# Patient Record
Sex: Female | Born: 1950 | Race: White | Hispanic: No | Marital: Married | Smoking: Never smoker
Health system: Southern US, Community
[De-identification: ages and names within clinical notes are randomized; demographics above are authoritative.]

## PROBLEM LIST (undated history)

## (undated) DIAGNOSIS — K589 Irritable bowel syndrome without diarrhea: Secondary | ICD-10-CM

## (undated) DIAGNOSIS — K219 Gastro-esophageal reflux disease without esophagitis: Secondary | ICD-10-CM

## (undated) DIAGNOSIS — I442 Atrioventricular block, complete: Secondary | ICD-10-CM

## (undated) DIAGNOSIS — M1990 Unspecified osteoarthritis, unspecified site: Secondary | ICD-10-CM

## (undated) DIAGNOSIS — G4730 Sleep apnea, unspecified: Secondary | ICD-10-CM

## (undated) DIAGNOSIS — K449 Diaphragmatic hernia without obstruction or gangrene: Secondary | ICD-10-CM

## (undated) DIAGNOSIS — I4892 Unspecified atrial flutter: Secondary | ICD-10-CM

## (undated) DIAGNOSIS — J309 Allergic rhinitis, unspecified: Secondary | ICD-10-CM

## (undated) DIAGNOSIS — D649 Anemia, unspecified: Secondary | ICD-10-CM

## (undated) DIAGNOSIS — K279 Peptic ulcer, site unspecified, unspecified as acute or chronic, without hemorrhage or perforation: Secondary | ICD-10-CM

## (undated) DIAGNOSIS — D689 Coagulation defect, unspecified: Secondary | ICD-10-CM

## (undated) DIAGNOSIS — M810 Age-related osteoporosis without current pathological fracture: Secondary | ICD-10-CM

## (undated) DIAGNOSIS — G43909 Migraine, unspecified, not intractable, without status migrainosus: Secondary | ICD-10-CM

## (undated) DIAGNOSIS — I4891 Unspecified atrial fibrillation: Secondary | ICD-10-CM

## (undated) DIAGNOSIS — E079 Disorder of thyroid, unspecified: Secondary | ICD-10-CM

## (undated) DIAGNOSIS — K2210 Ulcer of esophagus without bleeding: Secondary | ICD-10-CM

## (undated) DIAGNOSIS — Z950 Presence of cardiac pacemaker: Secondary | ICD-10-CM

## (undated) DIAGNOSIS — K5790 Diverticulosis of intestine, part unspecified, without perforation or abscess without bleeding: Secondary | ICD-10-CM

## (undated) DIAGNOSIS — I480 Paroxysmal atrial fibrillation: Secondary | ICD-10-CM

## (undated) DIAGNOSIS — E039 Hypothyroidism, unspecified: Secondary | ICD-10-CM

## (undated) DIAGNOSIS — E785 Hyperlipidemia, unspecified: Secondary | ICD-10-CM

## (undated) DIAGNOSIS — E041 Nontoxic single thyroid nodule: Secondary | ICD-10-CM

## (undated) HISTORY — DX: Unspecified osteoarthritis, unspecified site: M19.90

## (undated) HISTORY — DX: Ulcer of esophagus without bleeding: K22.10

## (undated) HISTORY — DX: Sleep apnea, unspecified: G47.30

## (undated) HISTORY — DX: Hypothyroidism, unspecified: E03.9

## (undated) HISTORY — DX: Unspecified atrial flutter: I48.92

## (undated) HISTORY — DX: Presence of cardiac pacemaker: Z95.0

## (undated) HISTORY — DX: Hyperlipidemia, unspecified: E78.5

## (undated) HISTORY — DX: Anemia, unspecified: D64.9

## (undated) HISTORY — DX: Age-related osteoporosis without current pathological fracture: M81.0

## (undated) HISTORY — DX: Nontoxic single thyroid nodule: E04.1

## (undated) HISTORY — DX: Migraine, unspecified, not intractable, without status migrainosus: G43.909

## (undated) HISTORY — DX: Gastro-esophageal reflux disease without esophagitis: K21.9

## (undated) HISTORY — DX: Diaphragmatic hernia without obstruction or gangrene: K44.9

## (undated) HISTORY — DX: Paroxysmal atrial fibrillation: I48.0

## (undated) HISTORY — DX: Unspecified atrial fibrillation: I48.91

## (undated) HISTORY — DX: Atrioventricular block, complete: I44.2

## (undated) HISTORY — DX: Irritable bowel syndrome, unspecified: K58.9

## (undated) HISTORY — DX: Disorder of thyroid, unspecified: E07.9

## (undated) HISTORY — DX: Peptic ulcer, site unspecified, unspecified as acute or chronic, without hemorrhage or perforation: K27.9

## (undated) HISTORY — DX: Diverticulosis of intestine, part unspecified, without perforation or abscess without bleeding: K57.90

## (undated) HISTORY — DX: Coagulation defect, unspecified: D68.9

## (undated) HISTORY — DX: Allergic rhinitis, unspecified: J30.9

---

## 1984-01-25 HISTORY — PX: THYROIDECTOMY, PARTIAL: SHX18

## 1985-01-24 HISTORY — PX: TUBAL LIGATION: SHX77

## 1994-01-24 DIAGNOSIS — I442 Atrioventricular block, complete: Secondary | ICD-10-CM

## 1994-01-24 HISTORY — DX: Atrioventricular block, complete: I44.2

## 1994-09-08 HISTORY — PX: PACEMAKER INSERTION: SHX728

## 1999-08-18 ENCOUNTER — Other Ambulatory Visit: Admission: RE | Admit: 1999-08-18 | Discharge: 1999-08-18 | Payer: Self-pay | Admitting: Obstetrics and Gynecology

## 2004-02-14 ENCOUNTER — Ambulatory Visit: Payer: Self-pay | Admitting: Internal Medicine

## 2004-04-09 ENCOUNTER — Ambulatory Visit: Payer: Self-pay | Admitting: Internal Medicine

## 2004-04-26 ENCOUNTER — Other Ambulatory Visit: Admission: RE | Admit: 2004-04-26 | Discharge: 2004-04-26 | Payer: Self-pay | Admitting: Obstetrics and Gynecology

## 2004-05-28 ENCOUNTER — Ambulatory Visit (HOSPITAL_COMMUNITY): Admission: RE | Admit: 2004-05-28 | Discharge: 2004-05-28 | Payer: Self-pay | Admitting: Obstetrics and Gynecology

## 2004-06-02 ENCOUNTER — Ambulatory Visit: Payer: Self-pay | Admitting: Cardiology

## 2004-07-14 ENCOUNTER — Ambulatory Visit: Payer: Self-pay | Admitting: Internal Medicine

## 2004-08-17 ENCOUNTER — Ambulatory Visit: Payer: Self-pay | Admitting: Internal Medicine

## 2004-09-14 ENCOUNTER — Ambulatory Visit: Payer: Self-pay | Admitting: Internal Medicine

## 2004-10-20 ENCOUNTER — Ambulatory Visit: Payer: Self-pay | Admitting: Internal Medicine

## 2004-11-05 ENCOUNTER — Ambulatory Visit: Payer: Self-pay | Admitting: Internal Medicine

## 2004-11-17 ENCOUNTER — Ambulatory Visit: Payer: Self-pay | Admitting: Internal Medicine

## 2004-12-22 ENCOUNTER — Ambulatory Visit: Payer: Self-pay | Admitting: Internal Medicine

## 2005-01-28 ENCOUNTER — Ambulatory Visit: Payer: Self-pay | Admitting: Internal Medicine

## 2005-03-04 ENCOUNTER — Ambulatory Visit: Payer: Self-pay | Admitting: Internal Medicine

## 2005-04-15 ENCOUNTER — Ambulatory Visit: Payer: Self-pay | Admitting: Internal Medicine

## 2005-06-22 ENCOUNTER — Ambulatory Visit: Payer: Self-pay | Admitting: Internal Medicine

## 2005-07-12 ENCOUNTER — Ambulatory Visit (HOSPITAL_COMMUNITY): Admission: RE | Admit: 2005-07-12 | Discharge: 2005-07-12 | Payer: Self-pay | Admitting: Obstetrics and Gynecology

## 2005-08-12 ENCOUNTER — Ambulatory Visit: Payer: Self-pay | Admitting: Internal Medicine

## 2005-10-14 ENCOUNTER — Ambulatory Visit: Payer: Self-pay | Admitting: Internal Medicine

## 2005-11-03 ENCOUNTER — Encounter: Admission: RE | Admit: 2005-11-03 | Discharge: 2005-11-03 | Payer: Self-pay | Admitting: Orthopedic Surgery

## 2005-12-02 ENCOUNTER — Encounter: Admission: RE | Admit: 2005-12-02 | Discharge: 2005-12-02 | Payer: Self-pay | Admitting: Gastroenterology

## 2006-01-24 HISTORY — PX: HIP ARTHROPLASTY: SHX981

## 2006-07-18 ENCOUNTER — Ambulatory Visit (HOSPITAL_COMMUNITY): Admission: RE | Admit: 2006-07-18 | Discharge: 2006-07-18 | Payer: Self-pay | Admitting: Family Medicine

## 2006-08-01 ENCOUNTER — Inpatient Hospital Stay (HOSPITAL_COMMUNITY): Admission: RE | Admit: 2006-08-01 | Discharge: 2006-08-04 | Payer: Self-pay | Admitting: Orthopedic Surgery

## 2007-03-01 HISTORY — PX: PACEMAKER GENERATOR CHANGE: SHX5998

## 2007-07-19 ENCOUNTER — Ambulatory Visit (HOSPITAL_COMMUNITY): Admission: RE | Admit: 2007-07-19 | Discharge: 2007-07-19 | Payer: Self-pay | Admitting: Obstetrics and Gynecology

## 2008-10-29 ENCOUNTER — Ambulatory Visit (HOSPITAL_COMMUNITY): Admission: RE | Admit: 2008-10-29 | Discharge: 2008-10-29 | Payer: Self-pay | Admitting: Obstetrics and Gynecology

## 2010-02-14 ENCOUNTER — Encounter: Payer: Self-pay | Admitting: Family Medicine

## 2010-05-31 ENCOUNTER — Other Ambulatory Visit (HOSPITAL_COMMUNITY): Payer: Self-pay | Admitting: Obstetrics and Gynecology

## 2010-05-31 DIAGNOSIS — Z1231 Encounter for screening mammogram for malignant neoplasm of breast: Secondary | ICD-10-CM

## 2010-06-08 NOTE — H&P (Signed)
Kristin [name], Kristin [name]                ACCOUNT NO.:  [id number]   MEDICAL RECORD NO.:  [id number]         PATIENT TYPE:  LINP   LOCATION:                               FACILITY:  [facility]   PHYSICIAN:  Madlyn Frankel. Charlann Boxer, M.D.  DATE OF BIRTH:  Dec 01, 1950   DATE OF ADMISSION:  08/01/2006  DATE OF DISCHARGE:                              HISTORY & PHYSICAL   PROCEDURE:  Right total hip arthroplasty.   CHIEF COMPLAINT:  Right hip groin pain.   HISTORY OF PRESENT ILLNESS:  This is a 60 year old female with a history  persistent progressive right hip groin pain.  It has been refractory to  all conservative treatments.  She has been presurgically assessed by Dr.  Asencion Partridge.  She also does have a history of pacemaker back in 1996  which has been really basically unused since then.  She is going to  follow up with Dr. Graciela Husbands, a local cardiologist, before surgery in the  event there is any complications during surgery that requires the use of  this pacemaker.   PAST MEDICAL HISTORY:  Significant for:  1. Osteoarthritis.  2. 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  3. Gastroesophageal reflux disease.  4. Subtotal thyroidectomy.   PAST SURGICAL HISTORY:  1. Subtotal thyroidectomy.  2. Tubal ligation.  3. Pacemaker implant.  4. Colonoscopy.   FAMILY HISTORY:  Cancer, arthritis.   SOCIAL HISTORY:  The patient is married.  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  Her husband is Georges Lynch, who is an MD in psychiatry.   DRUG ALLERGIES:  1. MORPHINE.  2. Allergic to ADHESIVES including ELECTRODES, which leave marks for      several weeks.  3. She also has a questionable LATEX allergy.   MEDICATIONS:  1. Levothroid 125 mcg p.o. daily.  2. Celebrex 200 mg p.o. daily.  3. Multivitamin.  4. Calcium plus vitamin D.   REVIEW OF SYSTEMS:  NEUROLOGIC:  Occasional headaches.  GASTROINTESTINAL:  Hiatal hernia.  GYNECOLOGIC:  Menopausal, some  minor  hot flashes.  Otherwise, see HPI.   PHYSICAL EXAMINATION:  VITAL SIGNS:  Pulse 72, respirations 18, blood  pressure 104/76.  GENERAL:  She is awake, alert and oriented, well-developed, well-  nourished, no acute distress.  NECK:  Supple.  No carotid bruits.  CHEST:  Lungs clear to auscultation bilaterally.  BREASTS:  Deferred.  HEART:  Regular rate and rhythm without gallops, clicks, rubs or  murmurs.  ABDOMEN:  Soft, nontender, nondistended.  Bowel sounds present.  GENITOURINARY:  Deferred.  EXTREMITIES:  Right hip has increased pain with range of motion.  Her  dorsalis pedis pulse is positive in her right lower extremity.  SKIN:  Intact.  No cellulitis.  NEUROLOGIC:  Intact distal sensibilities.   Labs, EKG, chest x-ray all pending presurgical clearance.   IMPRESSION:  1. Osteoarthritis.  2. Thyroidectomy.  3. Pacemaker.   PLAN OF ACTION:  Right total hip arthroplasty August 01, 2006, at [facility] by surgeon, Dr. Durene Romans.  Risks  and complications were  discussed.  Questions were encouraged, answered and reviewed.  Postoperative prescriptions were given at time of H&P.     ______________________________  Yetta Glassman. Loreta Ave, [place]      Madlyn Frankel. Charlann Boxer, M.D.  Electronically Signed    BLM/MEDQ  D:  07/14/2006  T:  07/14/2006  Job:  [id number]

## 2010-06-08 NOTE — Op Note (Signed)
NAMEJERZEY, KOMPERDA                ACCOUNT NO.:  [id number]   MEDICAL RECORD NO.:  [id number]          PATIENT TYPE:  INP   LOCATION:  0003                         FACILITY:  [facility]   PHYSICIAN:  Madlyn Frankel. Charlann Boxer, M.D.  DATE OF BIRTH:  [birth year]   DATE OF PROCEDURE:  08/01/2006  DATE OF DISCHARGE:                               OPERATIVE REPORT   PREOPERATIVE DIAGNOSIS:  Right hip osteoarthritis.   POSTOPERATIVE DIAGNOSIS:  Right hip osteoarthritis.   PROCEDURE:  Right total hip replacement.   COMPONENTS USED:  DePuy hip system with a size 50 ASR cup, a size 6 Tri-  Lock Gription cup high-offset with a 45 +2 femoral head plus adapter.   SURGEON:  Dr. Charlann Boxer   ASSISTANT:  Dwyane Luo, PA-C   ANESTHESIA:  General.   BLOOD LOSS:  400.   DRAINS:  None.   COMPLICATIONS:  None.   INDICATION FOR PROCEDURE:  Ms. Nikolic is a pleasant 60 year old female who  presented to the office in the fall 2007 with complaints of bilateral  hip pain, right greater than left.  She had had radiographic evidence of  bone-on-bone articulation with cystic changes.  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  We discussed and reviewed the risks and benefits of  infection, dislocation, DVT, component failure, need for revision  surgery.  We discussed the bearing surfaces in terms of her health, age,  and longevity.  Consent was obtained for a metal-on-metal hip  replacement.   PROCEDURE IN DETAIL:  The patient was brought to the operative theatre.  Once adequate anesthesia and preoperative antibiotics were administered,  the patient was positioned in the left lateral decubitus position with  the right side up.  The right lower extremity was prescrubbed then  prepped and draped in a standard fashion.  Lateral-based incision was  made for a posterior approach to the hip.  The iliotibial band and  glutaral fascia was incised posteriorly.  The short  external rotators  were identified and taken down separate from the posterior capsule.  An  L capsulotomy was made, preserving the posterior leaflets and  reapproximated later as well as protecting the sciatic nerve from the  retractors.  The hip was dislocated, and the neck osteotomy was made  based off the anatomic landmarks and preoperative templating.  Following  the neck osteotomy, attention was first directed to the femur.  The  proximal femur was reamed and a hand reamer passed down into the canal  and irrigated to prevent fat emboli.  I then began broaching with a size  1 broach and initially carried it all the way up to a size 5.  This sat  at or just a little bit below my neck cut immediately medially, so I  used the calcar planner to brush this up.   I then packed off the femoral canal.  I did set femoral anteversion at  25 degrees proximally.  Attention was now directed to the acetabulum.  Following acetabular exposure and labrectomy, the  patient was noted to  have a little bit of a dysplastic lateral portion of her acetabulum with  hypertrophy of the labrum.  I did have to remove a portion of her  superior capsule in order to expose this area to be sure adequate  reaming depth and placement of the cup.  I then began reaming with 45  reamer, then carried this all the way up to a 49 reamer where I had  excellent bony preparation.  I subsequently impacted a 50 mm ASR cup  with 40 degrees of abduction, 20 degrees of forward flexion beneath the  anterior wall.  Once I was satisfied with impaction, I attended to a  trial reduction.  The size 5 broach was placed.  High offset neck was  placed.  Trial reduction noted the hip was very stable; however, I felt  there was a little extra shuck in extension indicating a potential  shortening.  This also matched up with her leg lengths.  Thus I felt she  was a little short.  I dislocated the hip and changed the 5 broach to a  size 6 which  sat a little proud of my neck cut.  Trial reduction was  then carried out.  Hip stability remained excellent which did not appear  to be lengthened.   At this point, all trial components were removed.  Final preparation of  the femur was carried out.  The size 6 high-offset stem was impacted to  the level where the broach was.  I retrialed to assure that I was happy  with the range of motion and stability and length and tension of the  soft tissue.  There was approximately 1 mm shuck in extension.  Hip  range of motion was made excellent.  Final 45 +2 adaptor was then  impacted on the cleaned and tried trunnion and the hip reduced.   At this point, the posterior capsule was reapproximated superiorly which  was remaining mainly on the posterior aspect of the hip with a #1  Ethibond.  The iliotibial band was reapproximated using #1 Ethibond.  Vicryl #1 was used in the gluteal fascia.  The remaining wound was  closed in layers with a 4-0 Monocryl.   The patient is noted to have ADHESIVE ALLERGY and is fairly reactive to  it.  I used a subcuticular stitch, and we used less of the Steri-Strips.  She then was dressed into a sterile dressing.   She was then brought to the recovery room and extubated in stable  condition without complications.      Madlyn Frankel Charlann Boxer, M.D.  Electronically Signed     MDO/MEDQ  D:  08/01/2006  T:  08/01/2006  Job:  [id number]

## 2010-06-10 ENCOUNTER — Ambulatory Visit (HOSPITAL_COMMUNITY)
Admission: RE | Admit: 2010-06-10 | Discharge: 2010-06-10 | Disposition: A | Discharge status: Home / Self Care | Payer: BC Managed Care – PPO | Source: Ambulatory Visit | Attending: Obstetrics and Gynecology | Admitting: Obstetrics and Gynecology

## 2010-06-10 DIAGNOSIS — Z1231 Encounter for screening mammogram for malignant neoplasm of breast: Secondary | ICD-10-CM

## 2010-06-11 NOTE — Discharge Summary (Signed)
NAMELEON, GOODNOW                ACCOUNT NO.:  [id number]   MEDICAL RECORD NO.:  [id number]          PATIENT TYPE:  INP   LOCATION:  1604                         FACILITY:  [facility]   PHYSICIAN:  Madlyn Frankel. Charlann Boxer, M.D.  DATE OF BIRTH:  May 07, 1950   DATE OF ADMISSION:  08/01/2006  DATE OF DISCHARGE:  08/04/2006                               DISCHARGE SUMMARY   ADMISSION DIAGNOSES:  1. Osteoarthritis.  2. 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  3. Gastroesophageal reflux disease.  4. Subtotal thyroidectomy.   DISCHARGE DIAGNOSES:  1. Osteoarthritis.  2. 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  3. Gastroesophageal reflux disease.  4. Subtotal thyroidectomy.   CONSULTATIONS:  None.   PROCEDURE:  Right total hip arthroplasty.  Surgeon:  Madlyn Frankel. Charlann Boxer,  M.D.  Assistant:  Yetta Glassman. Loreta Ave, PA-C.  Components:  Metal on metal.   HISTORY OF PRESENT ILLNESS:  A 60 year old female with a history of  progressive right hip and groin pain which is refractory to conservative  treatments.  Presurgically assessed by Dr. Asencion Partridge.  History of a  pacemaker in 1996 that has been unused as condition resolved, is  followed by the local cardiologist, Dr. Graciela Husbands.   LABS:  Preadmission CBC:  Hematocrit 37.2.  Postop day #1 hematocrit  31.5.  Postop day #2 hematocrit 27.5 and stable.  Coagulation within  normal limits.  Routine chemistry preop:  Glucose of 105.  Postop day #1  glucose 126, BUN 2.  On postoperative day #2, glucose 121, BUN 3.  GFR  greater than 60.  Calcium 8.5.  GI workup within normal limits.  UA  preoperative negative.   CARDIOLOGY:  EKGs:  Normal sinus rhythm.   RADIOLOGY:  Chest two-view:  No active disease.   HOSPITAL COURSE:  The patient underwent right total hip arthroplasty and  tolerated the procedure well, was admitted to the orthopedic floor.   She  denied significant muscle soreness and remained neurovascularly intact  to her right lower extremity.  Postop day #1, DVT prophylaxis as well as  PT/OT with weightbearing as tolerated was begun.  She progressed nicely  throughout her course of stay.  After postop day #1, dressing was  changed on a daily basis.  Wound had no active drainage at any time.  She was supplied with Mepilex dressings as she has significant skin  allergies.  At no point was any paper tape or anything placed on her  wounds.  She progressed nicely for 3 days and after 3 days of physical  therapy she was able to ambulate at least 100 feet independently.  She  was sent home with some dressing supplies as well to use.   DISCHARGE DISPOSITION:  Discharged home, home health care PT, in stable  and improved condition.   DISCHARGE DIET:  No restrictions.   DISCHARGE WOUND CARE:  Keep dry.   DISCHARGE PHYSICAL THERAPY:  She is weightbearing as tolerated using a  rolling walker.  Will transition to a single-point cane after 2 weeks.  Goals of physical therapy will be to minimize pain, maximize strength,  and increase range of motion.  I encouraged independence of activities  of daily living, work on gait training and proprioception.   DISCHARGE FOLLOW-UP:  Follow up with Dr. Charlann Boxer, [phone], in 2 weeks for  wound check.   DISCHARGE MEDICATIONS:  1. Vicodin 5/325 mg one to two p.o. q.4-6h. p.r.n. pain.  2. Lovenox 40 mg subcu q.24h. x11 days.  3. Robaxin 500 mg q.6h.  4. Colace 100 mg b.i.d.  5. Miralax 17 g b.i.d.  6. Iron 325 mg p.o. t.i.d. x3 weeks.  7. Enteric-coated aspirin 325 mg p.o. daily x4 weeks after Lovenox      completed.  8. Levothyroxine 125 mcg q.a.m.  9. Prilosec 20 mg q.a.m.  10.Multivitamin daily.  11.Calcium 600 mg plus D daily.   DISCHARGE SPECIAL INSTRUCTIONS:  1. Ice liberally.  2. TED hose, on 12, off 12.  3. Mepilex dressing to be sent home with patient.  4. If the patient  develops any acute shortness of breath or severe      calf pain, call emergency services immediately.     ______________________________  Yetta Glassman Loreta Ave, [place]      Madlyn Frankel. Charlann Boxer, M.D.  Electronically Signed    BLM/MEDQ  D:  08/15/2006  T:  08/16/2006  Job:  [id number]

## 2010-11-09 LAB — CBC
HCT: 27.4 — ABNORMAL LOW
HCT: 31.5 — ABNORMAL LOW
HCT: 37.2
Hemoglobin: 10.7 — ABNORMAL LOW
Hemoglobin: 12.7
Hemoglobin: 9.4 — ABNORMAL LOW
MCHC: 34
MCHC: 34
MCHC: 34.2
MCV: 91.8
MCV: 92.1
MCV: 92.2
Platelets: 170
Platelets: 185
Platelets: 223
RBC: 2.97 — ABNORMAL LOW
RBC: 3.41 — ABNORMAL LOW
RBC: 4.06
RDW: 14.8 — ABNORMAL HIGH
RDW: 14.9 — ABNORMAL HIGH
RDW: 15.4 — ABNORMAL HIGH
WBC: 4.1
WBC: 7
WBC: 7.2

## 2010-11-09 LAB — COMPREHENSIVE METABOLIC PANEL
ALT: 25
AST: 28
Albumin: 3.6
Alkaline Phosphatase: 84
BUN: 7
CO2: 29
Calcium: 9.8
Chloride: 105
Creatinine, Ser: 0.78
GFR calc Af Amer: >60
GFR calc non Af Amer: >60
Glucose, Bld: 105 — ABNORMAL HIGH
Potassium: 4.2
Sodium: 142
Total Bilirubin: 0.9
Total Protein: 6.5

## 2010-11-09 LAB — PROTIME-INR
INR: 1
Prothrombin Time: 13.4

## 2010-11-09 LAB — TYPE AND SCREEN
ABO/RH(D): A POS
Antibody Screen: NEGATIVE

## 2010-11-09 LAB — BASIC METABOLIC PANEL
BUN: 2 — ABNORMAL LOW
BUN: 3 — ABNORMAL LOW
CO2: 27
CO2: 30
Calcium: 8.5
Calcium: 8.7
Chloride: 104
Chloride: 107
Creatinine, Ser: 0.66
Creatinine, Ser: 0.79
GFR calc Af Amer: >60
GFR calc Af Amer: >60
GFR calc non Af Amer: >60
GFR calc non Af Amer: >60
Glucose, Bld: 121 — ABNORMAL HIGH
Glucose, Bld: 126 — ABNORMAL HIGH
Potassium: 3.7
Potassium: 4
Sodium: 139
Sodium: 139

## 2010-11-09 LAB — URINALYSIS, ROUTINE W REFLEX MICROSCOPIC
Bilirubin Urine: NEGATIVE
Glucose, UA: NEGATIVE
Hgb urine dipstick: NEGATIVE
Ketones, ur: NEGATIVE
Nitrite: NEGATIVE
Protein, ur: NEGATIVE
Specific Gravity, Urine: 1.009
Urobilinogen, UA: 0.2
pH: 6.5

## 2010-11-09 LAB — APTT: aPTT: 28

## 2010-11-09 LAB — ABO/RH: ABO/RH(D): A POS

## 2011-07-07 ENCOUNTER — Other Ambulatory Visit (HOSPITAL_COMMUNITY): Payer: Self-pay | Admitting: Obstetrics and Gynecology

## 2011-07-07 DIAGNOSIS — Z1231 Encounter for screening mammogram for malignant neoplasm of breast: Secondary | ICD-10-CM

## 2011-08-03 ENCOUNTER — Ambulatory Visit (HOSPITAL_COMMUNITY): Payer: BC Managed Care – PPO

## 2011-08-05 ENCOUNTER — Ambulatory Visit (HOSPITAL_COMMUNITY)
Admission: RE | Admit: 2011-08-05 | Discharge: 2011-08-05 | Disposition: A | Discharge status: Home / Self Care | Payer: BC Managed Care – PPO | Source: Ambulatory Visit | Attending: Obstetrics and Gynecology | Admitting: Obstetrics and Gynecology

## 2011-08-05 DIAGNOSIS — Z1231 Encounter for screening mammogram for malignant neoplasm of breast: Secondary | ICD-10-CM | POA: Insufficient documentation

## 2012-07-31 ENCOUNTER — Other Ambulatory Visit (HOSPITAL_COMMUNITY): Payer: Self-pay | Admitting: Obstetrics and Gynecology

## 2012-07-31 DIAGNOSIS — Z1231 Encounter for screening mammogram for malignant neoplasm of breast: Secondary | ICD-10-CM

## 2012-08-15 ENCOUNTER — Ambulatory Visit (HOSPITAL_COMMUNITY): Payer: BC Managed Care – PPO

## 2012-08-27 ENCOUNTER — Ambulatory Visit (HOSPITAL_COMMUNITY)
Admission: RE | Admit: 2012-08-27 | Discharge: 2012-08-27 | Disposition: A | Discharge status: Home / Self Care | Payer: BC Managed Care – PPO | Source: Ambulatory Visit | Attending: Obstetrics and Gynecology | Admitting: Obstetrics and Gynecology

## 2012-08-27 DIAGNOSIS — Z1231 Encounter for screening mammogram for malignant neoplasm of breast: Secondary | ICD-10-CM | POA: Insufficient documentation

## 2012-08-28 ENCOUNTER — Other Ambulatory Visit: Payer: Self-pay | Admitting: Obstetrics and Gynecology

## 2012-08-28 DIAGNOSIS — R928 Other abnormal and inconclusive findings on diagnostic imaging of breast: Secondary | ICD-10-CM

## 2012-09-07 ENCOUNTER — Ambulatory Visit
Admission: RE | Admit: 2012-09-07 | Discharge: 2012-09-07 | Disposition: A | Discharge status: Home / Self Care | Payer: BC Managed Care – PPO | Source: Ambulatory Visit | Attending: Obstetrics and Gynecology | Admitting: Obstetrics and Gynecology

## 2012-09-07 DIAGNOSIS — R928 Other abnormal and inconclusive findings on diagnostic imaging of breast: Secondary | ICD-10-CM

## 2013-01-08 DIAGNOSIS — Z8711 Personal history of peptic ulcer disease: Secondary | ICD-10-CM | POA: Insufficient documentation

## 2013-01-08 DIAGNOSIS — J309 Allergic rhinitis, unspecified: Secondary | ICD-10-CM | POA: Insufficient documentation

## 2013-01-08 DIAGNOSIS — E890 Postprocedural hypothyroidism: Secondary | ICD-10-CM | POA: Insufficient documentation

## 2013-01-08 DIAGNOSIS — K219 Gastro-esophageal reflux disease without esophagitis: Secondary | ICD-10-CM | POA: Insufficient documentation

## 2013-01-08 DIAGNOSIS — M1990 Unspecified osteoarthritis, unspecified site: Secondary | ICD-10-CM | POA: Insufficient documentation

## 2013-01-22 ENCOUNTER — Other Ambulatory Visit: Payer: Self-pay | Admitting: Obstetrics and Gynecology

## 2013-01-22 DIAGNOSIS — R921 Mammographic calcification found on diagnostic imaging of breast: Secondary | ICD-10-CM

## 2013-03-11 ENCOUNTER — Ambulatory Visit
Admission: RE | Admit: 2013-03-11 | Discharge: 2013-03-11 | Disposition: A | Discharge status: Home / Self Care | Payer: BC Managed Care – PPO | Source: Ambulatory Visit | Attending: Obstetrics and Gynecology | Admitting: Obstetrics and Gynecology

## 2013-03-11 DIAGNOSIS — R921 Mammographic calcification found on diagnostic imaging of breast: Secondary | ICD-10-CM

## 2013-06-04 ENCOUNTER — Encounter: Payer: Self-pay | Admitting: *Deleted

## 2013-06-04 NOTE — Progress Notes (Signed)
[facility] Healthcare Advance Directives Clinical Social Work  Clinical Social Work was referred by patient to review and complete healthcare advance directives.  Clinical Social Worker met with patient and spouse in CSW office.    Clinical Social Worker notarized documents and made copies for patient/family. Clinical Social Worker will send documents to medical records to be scanned into patient's chart. Clinical Social Worker encouraged patient/family to contact with any additional questions or concerns.  Lauren Mullis, MSW, LCSW Clinical Social Worker [facility] [phone]      

## 2013-07-01 ENCOUNTER — Other Ambulatory Visit: Payer: Self-pay | Admitting: Family Medicine

## 2013-07-01 DIAGNOSIS — R921 Mammographic calcification found on diagnostic imaging of breast: Secondary | ICD-10-CM

## 2013-09-02 ENCOUNTER — Ambulatory Visit
Admission: RE | Admit: 2013-09-02 | Discharge: 2013-09-02 | Disposition: A | Discharge status: Home / Self Care | Payer: BC Managed Care – PPO | Source: Ambulatory Visit | Attending: Family Medicine | Admitting: Family Medicine

## 2013-09-02 DIAGNOSIS — R921 Mammographic calcification found on diagnostic imaging of breast: Secondary | ICD-10-CM

## 2014-07-30 ENCOUNTER — Other Ambulatory Visit: Payer: Self-pay

## 2014-07-30 DIAGNOSIS — Z1231 Encounter for screening mammogram for malignant neoplasm of breast: Secondary | ICD-10-CM

## 2014-09-04 ENCOUNTER — Ambulatory Visit
Admission: RE | Admit: 2014-09-04 | Discharge: 2014-09-04 | Disposition: A | Discharge status: Home / Self Care | Payer: BC Managed Care – PPO | Source: Ambulatory Visit

## 2014-09-04 ENCOUNTER — Ambulatory Visit: Payer: BC Managed Care – PPO

## 2014-09-04 DIAGNOSIS — Z1231 Encounter for screening mammogram for malignant neoplasm of breast: Secondary | ICD-10-CM

## 2014-10-14 DIAGNOSIS — Z950 Presence of cardiac pacemaker: Secondary | ICD-10-CM | POA: Insufficient documentation

## 2014-10-14 DIAGNOSIS — I480 Paroxysmal atrial fibrillation: Secondary | ICD-10-CM | POA: Insufficient documentation

## 2015-01-25 HISTORY — PX: COLONOSCOPY: SHX174

## 2015-08-04 ENCOUNTER — Other Ambulatory Visit: Payer: Self-pay | Admitting: Obstetrics and Gynecology

## 2015-08-04 ENCOUNTER — Other Ambulatory Visit: Payer: Self-pay | Admitting: Family Medicine

## 2015-08-04 DIAGNOSIS — Z1231 Encounter for screening mammogram for malignant neoplasm of breast: Secondary | ICD-10-CM

## 2015-08-05 DIAGNOSIS — Z7901 Long term (current) use of anticoagulants: Secondary | ICD-10-CM | POA: Insufficient documentation

## 2015-09-14 ENCOUNTER — Ambulatory Visit
Admission: RE | Admit: 2015-09-14 | Discharge: 2015-09-14 | Disposition: A | Discharge status: Home / Self Care | Payer: BC Managed Care – PPO | Source: Ambulatory Visit | Attending: Family Medicine | Admitting: Family Medicine

## 2015-09-14 DIAGNOSIS — Z1231 Encounter for screening mammogram for malignant neoplasm of breast: Secondary | ICD-10-CM

## 2015-10-06 DIAGNOSIS — Z8679 Personal history of other diseases of the circulatory system: Secondary | ICD-10-CM | POA: Insufficient documentation

## 2015-10-06 DIAGNOSIS — I442 Atrioventricular block, complete: Secondary | ICD-10-CM | POA: Insufficient documentation

## 2015-10-07 ENCOUNTER — Ambulatory Visit (INDEPENDENT_AMBULATORY_CARE_PROVIDER_SITE_OTHER): Payer: BC Managed Care – PPO | Admitting: Internal Medicine

## 2015-10-07 ENCOUNTER — Encounter: Payer: Self-pay | Admitting: Internal Medicine

## 2015-10-07 VITALS — BP 120/88 | HR 101 | Ht 69.0 in | Wt 227.8 lb

## 2015-10-07 DIAGNOSIS — I480 Paroxysmal atrial fibrillation: Secondary | ICD-10-CM | POA: Diagnosis not present

## 2015-10-07 DIAGNOSIS — I442 Atrioventricular block, complete: Secondary | ICD-10-CM

## 2015-10-07 MED ORDER — DILTIAZEM HCL 30 MG PO TABS: ORAL_TABLET | ORAL | 3 refills | Status: DC

## 2015-10-07 NOTE — Patient Instructions (Addendum)
Medication Instructions:  Your physician has recommended you make the following change in your medication:   1) START Cardizem 30 mg take 1-2 tablet every 6 hours as needed for palpitations   Labwork: None Ordered   Testing/Procedures: None Ordered   Follow-Up: Your physician wants you to follow-up in: 1 year with Dr. Vallery Ridge will receive a reminder letter in the mail two months in advance. If you don't receive a letter, please call our office to schedule the follow-up appointment.  Remote monitoring is used to monitor your Pacemaker from home. This monitoring reduces the number of office visits required to check your device to one time per year. It allows [place] to keep an eye on the functioning of your device to ensure it is working properly. You are scheduled for a device check from home on 01/06/16. You may send your transmission at any time that day. If you have a wireless device, the transmission will be sent automatically. After your physician reviews your transmission, you will receive a postcard with your next transmission date.    Any Other Special Instructions Will Be Listed Below (If Applicable).     If you need a refill on your cardiac medications before your next appointment, please call your pharmacy.

## 2015-10-07 NOTE — Progress Notes (Signed)
Electrophysiology Office Note   Date:  10/07/2015   ID:  Kristin [name], DOB 02-Jan-1951, MRN [id number]  PCP:  Leamon Arnt, MD   Primary Electrophysiologist: [name], MD (previously Dr Rosita Fire at [facility])  [place]: to establish for PPM care   History of Present Illness: Kristin [name] is a 65 y.o. female who presents today for electrophysiology evaluation.   She reports that in 1996 she had abrupt onset of SOB and decreased exercise tolerance.  She noticed that her heart rate was 30s-40s.  She presented and was found to have complete heart block.  No reversible causes were found.  She was living in [place] at that time and underwent PPM 09/08/1994 with complete resolution of her symptoms.  She has done very well since.  She denies pacemaker complications. Interestingly, overtime, she has not required V pacing.  She moved to [facility] and saw Dr Caryl Comes initially.  She decided to transfer her care to [facility] and has since been followed by Dr Rosita Fire.  She underwent generator change in 2009 (MDT) though there was some discussion about to having the device replaced.  She is doing well.  She remains active. Her device has detected atrial fibrillation for which she has only rare palpitations.  She was evaluated by Dr Violet Baldy and initiated on eliquis.  She is tolerating this without bleeding.   She wishes to transfer her care back to [facility] and presents to establish with our office today.  Today, she denies symptoms of palpitations, chest pain, shortness of breath, orthopnea, PND, lower extremity edema, claudication, dizziness, presyncope, syncope, bleeding, or neurologic sequela. The patient is tolerating medications without difficulties and is otherwise without complaint today.    Past Medical History:  Diagnosis Date  . GERD (gastroesophageal reflux disease)   . Hiatal hernia   . Migraines   . Osteoarthritis   . Pacemaker    [facility]  . Paroxysmal a-fib ([place])   . Transient  complete heart block 1996   Past Surgical History:  Procedure Laterality Date  . PACEMAKER GENERATOR CHANGE  03/01/2007   performed by Dr Rosita Fire at [facility])  . PACEMAKER INSERTION  09/08/1994   performed in [facility] for transient complete heart block  . THYROIDECTOMY, PARTIAL  1986   BENIGN NODULES  . TUBAL LIGATION  1987     Current Outpatient Prescriptions  Medication Sig Dispense Refill  . acetaminophen (TYLENOL) 325 MG tablet Take 650 mg by mouth every 6 (six) hours as needed (pain).     Marland Kitchen apixaban (ELIQUIS) 5 MG TABS tablet Take 5 mg by mouth 2 (two) times daily.     . cetirizine (ZYRTEC) 10 MG tablet Take 10 mg by mouth daily.    Marland Kitchen levothyroxine (SYNTHROID, LEVOTHROID) 125 MCG tablet Take 125 mcg by mouth daily.     . Multiple Vitamin (MULTI-VITAMINS) TABS Take by mouth. [name]    . omeprazole (PRILOSEC) 20 MG capsule Take 20 mg by mouth daily.    Marland Kitchen OVER THE COUNTER MEDICATION Vitamins - Take as directed    . diltiazem (CARDIZEM) 30 MG tablet Take 1-2 tablets by mouth every 6 hours as needed for palpitations 30 tablet 3   No current facility-administered medications for this visit.     Allergies:   Morphine and related; Neomycin; Morphine; and Adhesive [tape]   Social History:  The patient  reports that she has never smoked. She has never used smokeless tobacco. She reports that she  does not drink alcohol or use drugs.   Family History:  The patient's family history includes Cancer in her maternal grandmother; Diabetes Mellitus II in her maternal uncle.    ROS:  Please see the history of present illness.   All other systems are reviewed and negative.    PHYSICAL EXAM: VS:  BP 120/88   Pulse (!) 101   Ht 5\' 9"  (1.753 m)   Wt 227 lb 12.8 oz (103.3 kg)   BMI 33.64 kg/m  , BMI Body mass index is 33.64 kg/m. GEN: Well nourished, well developed, in no acute distress  HEENT: normal  Neck: no JVD, carotid bruits, or masses Cardiac:  RRR; no murmurs, rubs, or gallops,no edema  Respiratory:  clear to auscultation bilaterally, normal work of breathing GI: soft, nontender, nondistended, + BS MS: no deformity or atrophy  Skin: warm and dry, device pocket is well healed Neuro:  Strength and sensation are intact Psych: euthymic mood, full affect  EKG:  EKG is ordered today. The ekg ordered today shows sinus rhythm 101 bpm, incomplete RBBB, nonspecific ST/T changes  Device interrogation is reviewed today in detail.  See PaceArt for details.   Recent Labs: No results found for requested labs within last 8760 hours.    Lipid Panel  No results found for: CHOL, TRIG, HDL, CHOLHDL, VLDL, LDLCALC, LDLDIRECT   Wt Readings from Last 3 Encounters:  10/07/15 227 lb 12.8 oz (103.3 kg)      Other studies Reviewed: Additional studies/ records that were reviewed today include: over 20 pages of records from [place] [facility] are reviewed  Review of the above records today demonstrates: Echo 10/23/14- EF 60-65% with normal LV size, moderate RA enlargement with normal RV function, mild LA enlargment, mild to mod RA enlargement, mild TR   ASSESSMENT AND PLAN:  1.  Transient complete heart block The patient had transient complete heart block 20 years ago for which she received a PPM.  She has A paced 4% but not V paced in quite some time.  She raises questions about whether or not she needs a PPM.  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 We had a long discussion.  We have opted to reprogram her device to VVI 40 bpm and observe going forward.  This may help preserve battery and will also let us know if she requires pacing going forward.   carelink  2. Paroxysmal atrial fibrillation Low AF burden chads2vasc score is 1 (female) She was previously started on eliquis by [facility] cardiology.  Today we discussed this at length.  We discussed AVERROES data.  I have given option of stopping or continuing anticoagulation.  She is clear at  this time that she would favor continued anticoagulation  No changes today  Follow-up: return to see me in 1 year  Current medicines are reviewed at length with the patient today.   The patient does not have concerns regarding her medicines.  The following changes were made today:  none  Labs/ tests ordered today include:   Orders Placed This Encounter  Procedures  . EKG 12-Lead     Signed, [name], MD  10/07/2015 5:37 PM     [address] [address] [phone] (office) [phone] (fax)

## 2016-01-04 DIAGNOSIS — K648 Other hemorrhoids: Secondary | ICD-10-CM | POA: Insufficient documentation

## 2016-01-04 DIAGNOSIS — K5730 Diverticulosis of large intestine without perforation or abscess without bleeding: Secondary | ICD-10-CM | POA: Insufficient documentation

## 2016-01-14 ENCOUNTER — Ambulatory Visit (INDEPENDENT_AMBULATORY_CARE_PROVIDER_SITE_OTHER): Payer: BC Managed Care – PPO | Admitting: *Deleted

## 2016-01-14 ENCOUNTER — Telehealth: Payer: Self-pay | Admitting: Cardiology

## 2016-01-14 DIAGNOSIS — I442 Atrioventricular block, complete: Secondary | ICD-10-CM | POA: Diagnosis not present

## 2016-01-14 NOTE — Progress Notes (Signed)
Remote pacemaker transmission.   

## 2016-01-14 NOTE — Telephone Encounter (Signed)
Spoke with pt and reminded pt of remote transmission that is due today. Pt verbalized understanding.   

## 2016-01-15 LAB — CUP PACEART REMOTE DEVICE CHECK
Battery Impedance: 2537 Ohm
Battery Remaining Longevity: 31 mo
Battery Voltage: 2.75 V
Brady Statistic RV Percent Paced: 0 %
Date Time Interrogation Session: 20171221180055
Implantable Lead Implant Date: 19960815
Implantable Lead Implant Date: 19960815
Implantable Lead Location: 753859
Implantable Lead Location: 753860
Implantable Lead Model: 5024
Implantable Pulse Generator Implant Date: 20090205
Lead Channel Impedance Value: 67 Ohm
Lead Channel Impedance Value: 726 Ohm
Lead Channel Pacing Threshold Amplitude: 0.875 V
Lead Channel Pacing Threshold Pulse Width: 0.4 ms
Lead Channel Setting Pacing Amplitude: 2 V
Lead Channel Setting Pacing Pulse Width: 0.4 ms
Lead Channel Setting Sensing Sensitivity: 2.8 mV

## 2016-01-25 DIAGNOSIS — I4891 Unspecified atrial fibrillation: Secondary | ICD-10-CM

## 2016-01-25 HISTORY — DX: Unspecified atrial fibrillation: I48.91

## 2016-04-14 ENCOUNTER — Ambulatory Visit (INDEPENDENT_AMBULATORY_CARE_PROVIDER_SITE_OTHER): Payer: BC Managed Care – PPO | Admitting: *Deleted

## 2016-04-14 DIAGNOSIS — I442 Atrioventricular block, complete: Secondary | ICD-10-CM | POA: Diagnosis not present

## 2016-04-14 NOTE — Progress Notes (Signed)
Remote pacemaker transmission.   

## 2016-04-15 ENCOUNTER — Encounter: Payer: Self-pay | Admitting: Cardiology

## 2016-04-25 LAB — CUP PACEART REMOTE DEVICE CHECK
Battery Impedance: 2787 Ohm
Battery Remaining Longevity: 28 mo
Battery Voltage: 2.75 V
Brady Statistic RV Percent Paced: 0 %
Date Time Interrogation Session: 20180322102742
Implantable Lead Implant Date: 19960815
Implantable Lead Implant Date: 19960815
Implantable Lead Location: 753859
Implantable Lead Location: 753860
Implantable Lead Model: 5024
Implantable Pulse Generator Implant Date: 20090205
Lead Channel Impedance Value: 67 Ohm
Lead Channel Impedance Value: 694 Ohm
Lead Channel Pacing Threshold Amplitude: 0.75 V
Lead Channel Pacing Threshold Pulse Width: 0.4 ms
Lead Channel Setting Pacing Amplitude: 2 V
Lead Channel Setting Pacing Pulse Width: 0.4 ms
Lead Channel Setting Sensing Sensitivity: 2.8 mV

## 2016-04-26 ENCOUNTER — Other Ambulatory Visit: Payer: Self-pay | Admitting: Internal Medicine

## 2016-04-26 NOTE — Telephone Encounter (Signed)
Age 65 Wt 103.3kg  (10/07/2015)  Saw Dr Rayann Heman on 10/07/2015 08/05/2015 Hgb 13.1 HCT 39.1 SrCr 0.82  Refill done on Eliquis 5mg  q 12 hours

## 2016-07-28 ENCOUNTER — Ambulatory Visit (INDEPENDENT_AMBULATORY_CARE_PROVIDER_SITE_OTHER): Payer: BC Managed Care – PPO | Admitting: *Deleted

## 2016-07-28 DIAGNOSIS — I442 Atrioventricular block, complete: Secondary | ICD-10-CM | POA: Diagnosis not present

## 2016-07-29 LAB — CUP PACEART REMOTE DEVICE CHECK
Battery Impedance: 2734 Ohm
Battery Remaining Longevity: 29 mo
Battery Voltage: 2.75 V
Brady Statistic RV Percent Paced: 0 %
Date Time Interrogation Session: 20180705143026
Implantable Lead Implant Date: 19960815
Implantable Lead Implant Date: 19960815
Implantable Lead Location: 753859
Implantable Lead Location: 753860
Implantable Lead Model: 5024
Implantable Pulse Generator Implant Date: 20090205
Lead Channel Impedance Value: 67 Ohm
Lead Channel Impedance Value: 739 Ohm
Lead Channel Pacing Threshold Amplitude: 0.875 V
Lead Channel Pacing Threshold Pulse Width: 0.4 ms
Lead Channel Sensing Intrinsic Amplitude: 8 mV
Lead Channel Setting Pacing Amplitude: 2 V
Lead Channel Setting Pacing Pulse Width: 0.4 ms
Lead Channel Setting Sensing Sensitivity: 4 mV

## 2016-07-29 NOTE — Progress Notes (Signed)
Remote pacemaker transmission.   

## 2016-08-05 ENCOUNTER — Encounter: Payer: Self-pay | Admitting: Cardiology

## 2016-08-10 ENCOUNTER — Other Ambulatory Visit: Payer: Self-pay | Admitting: Family Medicine

## 2016-08-10 DIAGNOSIS — Z1231 Encounter for screening mammogram for malignant neoplasm of breast: Secondary | ICD-10-CM

## 2016-09-15 ENCOUNTER — Ambulatory Visit
Admission: RE | Admit: 2016-09-15 | Discharge: 2016-09-15 | Disposition: A | Discharge status: Home / Self Care | Payer: BC Managed Care – PPO | Source: Ambulatory Visit | Attending: Family Medicine | Admitting: Family Medicine

## 2016-09-15 DIAGNOSIS — Z1231 Encounter for screening mammogram for malignant neoplasm of breast: Secondary | ICD-10-CM

## 2016-10-07 ENCOUNTER — Ambulatory Visit: Payer: BC Managed Care – PPO | Admitting: Neurology

## 2016-10-14 ENCOUNTER — Ambulatory Visit (INDEPENDENT_AMBULATORY_CARE_PROVIDER_SITE_OTHER): Payer: BC Managed Care – PPO | Admitting: Internal Medicine

## 2016-10-14 ENCOUNTER — Encounter: Payer: Self-pay | Admitting: Internal Medicine

## 2016-10-14 VITALS — BP 116/80 | HR 89 | Ht 68.5 in | Wt 228.8 lb

## 2016-10-14 DIAGNOSIS — I480 Paroxysmal atrial fibrillation: Secondary | ICD-10-CM | POA: Diagnosis not present

## 2016-10-14 DIAGNOSIS — I442 Atrioventricular block, complete: Secondary | ICD-10-CM | POA: Diagnosis not present

## 2016-10-14 LAB — CUP PACEART INCLINIC DEVICE CHECK
Battery Impedance: 2762 Ohm
Battery Remaining Longevity: 29 mo
Battery Voltage: 2.74 V
Brady Statistic RV Percent Paced: 0 %
Date Time Interrogation Session: 20180921151808
Implantable Lead Implant Date: 19960815
Implantable Lead Implant Date: 19960815
Implantable Lead Location: 753859
Implantable Lead Location: 753860
Implantable Lead Model: 5024
Implantable Pulse Generator Implant Date: 20090205
Lead Channel Impedance Value: 67 Ohm
Lead Channel Impedance Value: 774 Ohm
Lead Channel Pacing Threshold Amplitude: 1.25 V
Lead Channel Pacing Threshold Pulse Width: 0.4 ms
Lead Channel Sensing Intrinsic Amplitude: 11.2 mV
Lead Channel Setting Pacing Amplitude: 2.25 V
Lead Channel Setting Pacing Pulse Width: 0.4 ms
Lead Channel Setting Sensing Sensitivity: 2.8 mV

## 2016-10-14 NOTE — Progress Notes (Signed)
    PCP: Leamon Arnt, MD Primary Cardiologist: Primary EP:  Kristin [name] is a 66 y.o. female who presents today for routine electrophysiology followup.  Since last being seen in our clinic, the patient reports doing very well.  Today, she denies symptoms of palpitations, chest pain, shortness of breath,  lower extremity edema, dizziness, presyncope, or syncope.  The patient is otherwise without complaint today.   Past Medical History:  Diagnosis Date  . GERD (gastroesophageal reflux disease)   . Hiatal hernia   . Migraines   . Osteoarthritis   . Pacemaker    CHB  . Paroxysmal A-fib ([place])   . Transient complete heart block 1996   Past Surgical History:  Procedure Laterality Date  . PACEMAKER GENERATOR CHANGE  03/01/2007   performed by Kristin Rosita Fire at [facility])  . PACEMAKER INSERTION  09/08/1994   performed in [facility] for transient complete heart block  . THYROIDECTOMY, PARTIAL  1986   BENIGN NODULES  . TUBAL LIGATION  1987    ROS- all systems are reviewed and negative except as per HPI above  Current Outpatient Prescriptions  Medication Sig Dispense Refill  . acetaminophen (TYLENOL) 325 MG tablet Take 650 mg by mouth every 6 (six) hours as needed (pain).     . cetirizine (ZYRTEC) 10 MG tablet Take 10 mg by mouth daily.    Marland Kitchen diltiazem (CARDIZEM) 30 MG tablet Take 1-2 tablets by mouth every 6 hours as needed for palpitations 30 tablet 3  . ELIQUIS 5 MG TABS tablet TAKE 1 TABLET TWICE DAILY. 60 tablet 5  . levothyroxine (SYNTHROID, LEVOTHROID) 125 MCG tablet Take 125 mcg by mouth daily.     . Multiple Vitamin (MULTI-VITAMINS) TABS Take by mouth. [name]    . omeprazole (PRILOSEC) 20 MG capsule Take 20 mg by mouth daily.    Marland Kitchen OVER THE COUNTER MEDICATION Vitamins - Take as directed    . traMADol (ULTRAM) 50 MG tablet      No current facility-administered medications for this visit.     Physical Exam: Vitals:   10/14/16  1223  BP: 116/80  Pulse: 89  SpO2: 98%  Weight: 228 lb 12.8 oz (103.8 kg)  Height: 5' 8.5" (1.74 m)    GEN- The patient is well appearing, alert and oriented x 3 today.   Head- normocephalic, atraumatic Eyes-  Sclera clear, conjunctiva pink Ears- hearing intact Oropharynx- clear Lungs- Clear to ausculation bilaterally, normal work of breathing Chest- pacemaker pocket is well healed Heart- Regular rate and rhythm, no murmurs, rubs or gallops, PMI not laterally displaced GI- soft, NT, ND, + BS Extremities- no clubbing, cyanosis, or edema  Pacemaker interrogation- reviewed in detail today,  See PACEART report  ekg tracing ordered today is personally reviewed and shows sinus rhythm, PR 144 msec, QRS 120 msec, RBBB  Assessment and Plan:  1. 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 Paroxysmal atrial fibrillation Low AF burden  On eliquis (chads2vasc score is 2) Prn metoprolol  carelink Return to see me in a year  [name] MD, [facility] 10/14/2016 12:54 PM

## 2016-10-14 NOTE — Patient Instructions (Addendum)
Medication Instructions:  Your physician recommends that you continue on your current medications as directed. Please refer to the Current Medication list given to you today.   Labwork: None ordered   Testing/Procedures: None ordered   Follow-Up: Your physician wants you to follow-up in: 12 months with Dr Rayann Heman Dennis Bast will receive a reminder letter in the mail two months in advance. If you don't receive a letter, please call our office to schedule the follow-up appointment.  Remote monitoring is used to monitor your Pacemaker from home. This monitoring reduces the number of office visits required to check your device to one time per year. It allows [place] to keep an eye on the functioning of your device to ensure it is working properly. You are scheduled for a device check from home on 10/27/16. You may send your transmission at any time that day. If you have a wireless device, the transmission will be sent automatically. After your physician reviews your transmission, you will receive a postcard with your next transmission date.      Any Other Special Instructions Will Be Listed Below (If Applicable).     If you need a refill on your cardiac medications before your next appointment, please call your pharmacy.

## 2016-10-27 ENCOUNTER — Ambulatory Visit (INDEPENDENT_AMBULATORY_CARE_PROVIDER_SITE_OTHER): Payer: BC Managed Care – PPO | Admitting: *Deleted

## 2016-10-27 DIAGNOSIS — I442 Atrioventricular block, complete: Secondary | ICD-10-CM | POA: Diagnosis not present

## 2016-10-27 NOTE — Progress Notes (Signed)
Remote pacemaker transmission.   

## 2016-10-28 ENCOUNTER — Other Ambulatory Visit: Payer: Self-pay | Admitting: Internal Medicine

## 2016-10-28 LAB — CUP PACEART REMOTE DEVICE CHECK
Battery Impedance: 2944 Ohm
Battery Remaining Longevity: 27 mo
Battery Voltage: 2.74 V
Brady Statistic RV Percent Paced: 0 %
Date Time Interrogation Session: 20181004114424
Implantable Lead Implant Date: 19960815
Implantable Lead Implant Date: 19960815
Implantable Lead Location: 753859
Implantable Lead Location: 753860
Implantable Lead Model: 5024
Implantable Pulse Generator Implant Date: 20090205
Lead Channel Impedance Value: 67 Ohm
Lead Channel Impedance Value: 773 Ohm
Lead Channel Pacing Threshold Amplitude: 1 V
Lead Channel Pacing Threshold Pulse Width: 0.4 ms
Lead Channel Sensing Intrinsic Amplitude: 5.6 mV
Lead Channel Setting Pacing Amplitude: 2 V
Lead Channel Setting Pacing Pulse Width: 0.4 ms
Lead Channel Setting Sensing Sensitivity: 2.8 mV

## 2016-10-28 NOTE — Telephone Encounter (Signed)
Age 66 years Wt 103.8kg 10/14/2016 Saw Dr Rayann Heman on 10/14/2016 08/17/2016 SrCr  0.69  In care everywhere and Hgb 12.2 HCT 37.8 care everywhere Refill done for Eliquis 5mg  q 12 hours as requested

## 2016-10-31 ENCOUNTER — Telehealth: Payer: Self-pay | Admitting: Internal Medicine

## 2016-10-31 NOTE — Telephone Encounter (Signed)
New message      Talk to the nurse regarding EKG taken on 10-14-16

## 2016-11-02 NOTE — Telephone Encounter (Signed)
Spoke with patient and discussed questions regarding EKG.  She was appreciative of my call

## 2016-11-03 ENCOUNTER — Encounter: Payer: Self-pay | Admitting: Cardiology

## 2016-11-24 ENCOUNTER — Ambulatory Visit (INDEPENDENT_AMBULATORY_CARE_PROVIDER_SITE_OTHER): Payer: BC Managed Care – PPO | Admitting: Neurology

## 2016-11-24 ENCOUNTER — Encounter: Payer: Self-pay | Admitting: Neurology

## 2016-11-24 VITALS — BP 125/78 | HR 84 | Ht 69.0 in | Wt 230.6 lb

## 2016-11-24 DIAGNOSIS — R51 Headache with orthostatic component, not elsewhere classified: Secondary | ICD-10-CM

## 2016-11-24 DIAGNOSIS — H5711 Ocular pain, right eye: Secondary | ICD-10-CM | POA: Diagnosis not present

## 2016-11-24 DIAGNOSIS — H93A1 Pulsatile tinnitus, right ear: Secondary | ICD-10-CM | POA: Diagnosis not present

## 2016-11-24 DIAGNOSIS — H40051 Ocular hypertension, right eye: Secondary | ICD-10-CM | POA: Diagnosis not present

## 2016-11-24 DIAGNOSIS — R0683 Snoring: Secondary | ICD-10-CM | POA: Diagnosis not present

## 2016-11-24 DIAGNOSIS — I671 Cerebral aneurysm, nonruptured: Secondary | ICD-10-CM | POA: Diagnosis not present

## 2016-11-24 DIAGNOSIS — H93A9 Pulsatile tinnitus, unspecified ear: Secondary | ICD-10-CM

## 2016-11-24 DIAGNOSIS — R400 Somnolence: Secondary | ICD-10-CM

## 2016-11-24 DIAGNOSIS — R519 Headache, unspecified: Secondary | ICD-10-CM

## 2016-11-24 DIAGNOSIS — G8929 Other chronic pain: Secondary | ICD-10-CM

## 2016-11-24 NOTE — Addendum Note (Signed)
Addended by: Sarina Ill B on: 11/24/2016 07:26 PM   Modules accepted: Level of Service

## 2016-11-24 NOTE — Progress Notes (Addendum)
[facility]    Provider:  Dr Jaynee Eagles Referring Provider: Leamon Arnt, MD Primary Care Physician:  Leamon Arnt, MD  CC:  Eye pressure right eye, snoring  HPI:  Kristin [name] is a 66 y.o. female here as a referral from Dr. Jonni Sanger for possible migraine variant. Past medical history of high blood pressure, hypothyroidism, jerks as well as atrial fibrillation, osteoarthritis, and long-term use of anticoagulant. Patient gets a pressure sensation behind her eye right eye intermittently, he still occur every few months but now is occurring more frequently, no vision changes, no known aggravating or relieving factors, no aura, no subsequent headaches she does have a history of migraine headaches. There has been a slight change in her vision but has seen the eye doctor. Ongoing for a year, conservative measures have failed. She has the pain currently and it is daily. Waxes and wanes, she wakes with it, she is very tired, she snores, does not get refreshed sleep, no numbness or tingling, no light or sound sensitivity, nausea or vomiting. No other focal neurologic deficits, associated symptoms, inciting events or modifiable factors.  About 18 months ago she started noticed pressure behind her eyes, cutting back on coffee helped, she continues to have pressure behind her right eye, it is uncomfortable and she has needed advil, tylenol, not painful or debilitating but can be uncomfortable and concerning. It has not gone away. She has a pacemaker but we can have an MRI.   Review of Systems: Patient complains of symptoms per HPI as well as the following symptoms: headache, snoring, joint pain, shift work. Pertinent negatives and positives per HPI. All others negative.   Social History   Social History  . Marital status: Married    Spouse name: N/A  . Number of children: N/A  . Years of education: N/A   Occupational History  . Not on file.   Social History Main Topics  .  Smoking status: Never Smoker  . Smokeless tobacco: Never Used  . Alcohol use 0.6 oz/week    1 Glasses of wine per week     Comment: occasionally  . Drug use: No  . Sexual activity: Not on file   Other Topics Concern  . 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 Diabetes Mellitus II Maternal Uncle   . Cancer Maternal Grandmother        ENDOMETRIAL  . Breast cancer Neg Hx     Past Medical History:  Diagnosis Date  . GERD (gastroesophageal reflux disease)   . Hiatal hernia   . Migraines   . Osteoarthritis   . Pacemaker    CHB  . Paroxysmal A-fib ([place])   . Transient complete heart block 1996    Past Surgical History:  Procedure Laterality Date  . PACEMAKER GENERATOR CHANGE  03/01/2007   performed by [facility] Rosita [facility] at [facility])  . PACEMAKER INSERTION  09/08/1994   performed in [facility] for transient complete heart block  . THYROIDECTOMY, PARTIAL  1986   BENIGN NODULES  . TUBAL LIGATION  1987    Current Outpatient Prescriptions  Medication Sig Dispense Refill  . Calcium Carb-Cholecalciferol (CALCIUM/VITAMIN D) 500-200 MG-UNIT TABS Take by mouth.    . diltiazem (CARDIZEM) 30 MG tablet Take 1-2 tablets by mouth every 6 hours as  needed for palpitations 30 tablet 3  . ELIQUIS 5 MG TABS tablet TAKE 1 TABLET TWICE DAILY. 60 tablet 5  . Fexofenadine HCl (ALLEGRA PO) Take by mouth.    . levothyroxine (SYNTHROID, LEVOTHROID) 125 MCG tablet Take 125 mcg by mouth daily.     . Multiple Vitamin (MULTI-VITAMINS) TABS Take by mouth. [name]    . omeprazole (PRILOSEC) 20 MG capsule Take 20 mg by mouth daily.    Marland Kitchen OVER THE COUNTER MEDICATION Vitamins - Take as directed    . traMADol (ULTRAM) 50 MG tablet     . UNABLE TO FIND b 12 drops daily     No current facility-administered medications for this visit.     Allergies  as of 11/24/2016 - Review Complete 11/24/2016  Allergen Reaction Noted  . Morphine and related Itching 01/08/2013  . Neomycin Other (See Comments) 01/08/2013  . Morphine Hives 10/14/2014  . Adhesive [tape] Rash 10/06/2015    Vitals: BP 125/78 (BP Location: Right Arm, Patient Position: Sitting, Cuff Size: Normal)   Pulse 84   Ht 5\' 9"  (1.753 m)   Wt 230 lb 9.6 oz (104.6 kg)   BMI 34.05 kg/m  Last Weight:  Wt Readings from Last 1 Encounters:  11/24/16 230 lb 9.6 oz (104.6 kg)   Last Height:   Ht Readings from Last 1 Encounters:  11/24/16 5\' 9"  (1.753 m)   Physical exam: Exam: Gen: NAD, conversant, well nourised, obese, well groomed                     CV: RRR, no MRG. No Carotid Bruits. No peripheral edema, warm, nontender Eyes: Conjunctivae clear without exudates or hemorrhage  Neuro: Detailed Neurologic Exam  Speech:    Speech is normal; fluent and spontaneous with normal comprehension.  Cognition:    The patient is oriented to person, place, and time;     recent and remote memory intact;     language fluent;     normal attention, concentration,     fund of knowledge Cranial Nerves:    The pupils are equal, round, and reactive to light. The fundi are normal and spontaneous venous pulsations are present. Visual fields are full to finger confrontation. Extraocular movements are intact. Trigeminal sensation is intact and the muscles of mastication are normal. The face is symmetric. The palate elevates in the midline. Hearing intact. Voice is normal. Shoulder shrug is normal. The tongue has normal motion without fasciculations.   Coordination:    Normal finger to nose and heel to shin. Normal rapid alternating movements.   Gait:    Wide based   Motor Observation:    No asymmetry, no atrophy, and no involuntary movements noted. Tone:    Normal muscle tone.    Posture:    Posture is normal. normal erect    Strength:    Strength is V/V in the upper and lower limbs.        Sensation: intact to LT     Reflex Exam:  DTR's:    Absent AJs, Deep tendon reflexes in the upper and lower extremities are symmetrical bilaterally.   Toes:    The toes are equivocal bilaterally.   Clonus:    Clonus is absent.  Assessment/Plan:  66 year old with new, worsening headache, positional, eye pain, pulsatile tinnitus  - Snoring, excessive daytime fatigue, pressure behind the eye in the morning: Sleep evaluation - CT/CTA head due to new onset headache after  the age of 66, positional headache to evaluate for space-occupying mass, other lesions, aneurysm due to pulsatile tinnitus, questionable pupillary asymetry also rule out third nerve palsy   Orders Placed This Encounter  Procedures  . MR BRAIN W WO CONTRAST  . MR MRA HEAD WO CONTRAST  . Comprehensive metabolic panel  . CBC  . Ambulatory referral to Sleep Studies     Discussed: To prevent or relieve headaches, try the following: Cool Compress. Lie down and place a cool compress on your head.  Avoid headache triggers. If certain foods or odors seem to have triggered your migraines in the past, avoid them. A headache diary might help you identify triggers.  Include physical activity in your daily routine. Try a daily walk or other moderate aerobic exercise.  Manage stress. Find healthy ways to cope with the stressors, such as delegating tasks on your to-do list.  Practice relaxation techniques. Try deep breathing, yoga, massage and visualization.  Eat regularly. Eating regularly scheduled meals and maintaining a healthy diet might help prevent headaches. Also, drink plenty of fluids.  Follow a regular sleep schedule. Sleep deprivation might contribute to headaches Consider biofeedback. With this mind-body technique, you learn to control certain bodily functions - such as muscle tension, heart rate and blood pressure - to prevent headaches or reduce headache pain.    Proceed to emergency room if you experience new  or worsening symptoms or symptoms do not resolve, if you have new neurologic symptoms or if headache is severe, or for any concerning symptom.   Provided education and documentation from American headache Society toolbox including articles on: chronic migraine medication overuse headache, chronic migraines, prevention of migraines, behavioral and other nonpharmacologic treatments for headache.  Sarina Ill, MD  [facility] [address] [place], [place] [address]  Phone [phone] Fax [phone]

## 2016-11-25 LAB — COMPREHENSIVE METABOLIC PANEL
ALT: 15 IU/L (ref 0–32)
AST: 22 IU/L (ref 0–40)
Albumin/Globulin Ratio: 1.8 (ref 1.2–2.2)
Albumin: 4.3 g/dL (ref 3.6–4.8)
Alkaline Phosphatase: 119 IU/L — ABNORMAL HIGH (ref 39–117)
BUN/Creatinine Ratio: 25 (ref 12–28)
BUN: 20 mg/dL (ref 8–27)
Bilirubin Total: 0.5 mg/dL (ref 0.0–1.2)
CO2: 21 mmol/L (ref 20–29)
Calcium: 9.1 mg/dL (ref 8.7–10.3)
Chloride: 101 mmol/L (ref 96–106)
Creatinine, Ser: 0.81 mg/dL (ref 0.57–1.00)
GFR calc Af Amer: 88 mL/min/{1.73_m2} (ref >59)
GFR calc non Af Amer: 76 mL/min/{1.73_m2} (ref >59)
Globulin, Total: 2.4 g/dL (ref 1.5–4.5)
Glucose: 93 mg/dL (ref 65–99)
Potassium: 4.5 mmol/L (ref 3.5–5.2)
Sodium: 139 mmol/L (ref 134–144)
Total Protein: 6.7 g/dL (ref 6.0–8.5)

## 2016-11-25 LAB — CBC
Hematocrit: 36.6 % (ref 34.0–46.6)
Hemoglobin: 12 g/dL (ref 11.1–15.9)
MCH: 30.1 pg (ref 26.6–33.0)
MCHC: 32.8 g/dL (ref 31.5–35.7)
MCV: 92 fL (ref 79–97)
Platelets: 243 10*3/uL (ref 150–379)
RBC: 3.99 x10E6/uL (ref 3.77–5.28)
RDW: 15.2 % (ref 12.3–15.4)
WBC: 6 10*3/uL (ref 3.4–10.8)

## 2016-11-28 ENCOUNTER — Telehealth: Payer: Self-pay | Admitting: *Deleted

## 2016-11-28 ENCOUNTER — Telehealth: Payer: Self-pay | Admitting: Neurology

## 2016-11-28 NOTE — Telephone Encounter (Signed)
Called and spoke with pt regarding lab results. I informed her that her alkphos is elevated and that sometimes we see this with gallbladder issues. I told her that if she is asymptomatic there is no hurry but she should discuss with her pcp at next appointment within 6 months or much sooner if she has any symptoms. Otherwise normal labs. She verbalized understanding and appreciation. She stated that she was not having any symptoms.

## 2016-11-28 NOTE — Telephone Encounter (Signed)
Saudia with [facility] called me back from Thursday 11/24/16. I faxed her all of the patients information on her pacemaker and she gave it the MRI tech's to look over to see if her pacemaker is compatible for the MRI. She stated that her pacemaker is not compatible and she can't have any MRI. She states she can have a CT. Please advice.

## 2016-11-28 NOTE — Telephone Encounter (Signed)
-----   Message from Melvenia Beam, MD sent at 11/25/2016 11:19 AM EDT ----- Her Alkphos is elevated, sometimes we see this is gallbladder issues. If she is asymptomatic there is no hurry but should discuss with pcp at next appointment within 6 months or much sooner if any symptoms. Otherwise normal.

## 2016-11-29 NOTE — Telephone Encounter (Signed)
I've emailed Dr. Rayann Heman about it thanks

## 2016-11-30 NOTE — Telephone Encounter (Signed)
Noted, thank you

## 2016-12-06 ENCOUNTER — Telehealth: Payer: Self-pay | Admitting: Neurology

## 2016-12-06 NOTE — Telephone Encounter (Signed)
Dr. Rayann Heman, I am seeing a mutual patient. It does not appear that patient can have an MRI due to pacemaker but she reports that you told her she could have an MRI. Would you mind letting me know? Thank you.

## 2016-12-13 NOTE — Addendum Note (Signed)
Addended by: Sarina Ill B on: 12/13/2016 03:26 PM   Modules accepted: Orders

## 2016-12-13 NOTE — Telephone Encounter (Signed)
We will have to perform a CTA instead of MRI, have ordered it thanks

## 2016-12-14 NOTE — Telephone Encounter (Signed)
Patient is schedule to have her CT Angio for 12/23/16 at [facility] patient is aware of time and day.

## 2016-12-15 NOTE — Telephone Encounter (Signed)
Though her specific pacemaker model is not FDA labelled for MRI, our guidelines, now allow some legacy devices such as hers to be MRI'd.  We could probably get her an MRI if she really needs one.

## 2016-12-16 NOTE — Telephone Encounter (Signed)
Thank you Dr. Rayann Heman, we will order a CT and if needed we can go forward with the MRI. Thank you for your reply and the email!

## 2016-12-23 ENCOUNTER — Ambulatory Visit (HOSPITAL_COMMUNITY)
Admission: RE | Admit: 2016-12-23 | Discharge: 2016-12-23 | Disposition: A | Discharge status: Home / Self Care | Payer: BC Managed Care – PPO | Source: Ambulatory Visit | Attending: Neurology | Admitting: Neurology

## 2016-12-23 DIAGNOSIS — R519 Headache, unspecified: Secondary | ICD-10-CM

## 2016-12-23 DIAGNOSIS — R400 Somnolence: Secondary | ICD-10-CM | POA: Insufficient documentation

## 2016-12-23 DIAGNOSIS — H93A9 Pulsatile tinnitus, unspecified ear: Secondary | ICD-10-CM | POA: Diagnosis present

## 2016-12-23 DIAGNOSIS — H40051 Ocular hypertension, right eye: Secondary | ICD-10-CM | POA: Diagnosis not present

## 2016-12-23 DIAGNOSIS — I6529 Occlusion and stenosis of unspecified carotid artery: Secondary | ICD-10-CM | POA: Insufficient documentation

## 2016-12-23 DIAGNOSIS — I671 Cerebral aneurysm, nonruptured: Secondary | ICD-10-CM | POA: Diagnosis not present

## 2016-12-23 DIAGNOSIS — R0683 Snoring: Secondary | ICD-10-CM | POA: Insufficient documentation

## 2016-12-23 DIAGNOSIS — R51 Headache with orthostatic component, not elsewhere classified: Secondary | ICD-10-CM

## 2016-12-23 DIAGNOSIS — H93A1 Pulsatile tinnitus, right ear: Secondary | ICD-10-CM | POA: Insufficient documentation

## 2016-12-23 DIAGNOSIS — H5711 Ocular pain, right eye: Secondary | ICD-10-CM | POA: Diagnosis not present

## 2016-12-23 MED ORDER — IOPAMIDOL (ISOVUE-370) INJECTION 76%
INTRAVENOUS | Status: AC
  Administered 2016-12-23: 50 mL
  Filled 2016-12-23: qty 50

## 2016-12-26 ENCOUNTER — Telehealth: Payer: Self-pay | Admitting: *Deleted

## 2016-12-26 NOTE — Telephone Encounter (Addendum)
Called and LVM asking for call back. If she calls back, please inform her that the imaging of the brain & blood vessels is normal.    ----- Message from Melvenia Beam, MD sent at 12/25/2016  7:27 PM EST ----- Imaging of the brain and blood vessels are normal thanks

## 2016-12-26 NOTE — Telephone Encounter (Signed)
Called patient back and LVM asking for call back to discuss her questions.

## 2016-12-26 NOTE — Telephone Encounter (Signed)
Patient did get normal test results but would like to discuss further with questions.

## 2016-12-28 ENCOUNTER — Encounter: Payer: Self-pay | Admitting: Neurology

## 2016-12-28 ENCOUNTER — Ambulatory Visit: Payer: BC Managed Care – PPO | Admitting: Neurology

## 2016-12-28 VITALS — BP 140/84 | HR 101 | Ht 69.0 in | Wt 226.0 lb

## 2016-12-28 DIAGNOSIS — R519 Headache, unspecified: Secondary | ICD-10-CM

## 2016-12-28 DIAGNOSIS — E669 Obesity, unspecified: Secondary | ICD-10-CM | POA: Diagnosis not present

## 2016-12-28 DIAGNOSIS — Z950 Presence of cardiac pacemaker: Secondary | ICD-10-CM

## 2016-12-28 DIAGNOSIS — R51 Headache: Secondary | ICD-10-CM | POA: Diagnosis not present

## 2016-12-28 DIAGNOSIS — R0683 Snoring: Secondary | ICD-10-CM | POA: Diagnosis not present

## 2016-12-28 DIAGNOSIS — I480 Paroxysmal atrial fibrillation: Secondary | ICD-10-CM | POA: Diagnosis not present

## 2016-12-28 DIAGNOSIS — I442 Atrioventricular block, complete: Secondary | ICD-10-CM | POA: Diagnosis not present

## 2016-12-28 NOTE — Progress Notes (Signed)
Subjective:    Patient ID: Kristin [name] is a 66 y.o. female.  HPI     Star Age, MD, PhD [facility] [address] [place], [place] [address]  Dear Berta Minor,   I saw your patient, Kristin [name], upon your kind request in my clinic today for initial consultation of her sleep disorder, in particular, concern for underlying obstructive sleep apnea. The patient is unaccompanied today. As you know, Kristin [name] is a 66 year old right-handed woman with an underlying medical history of reflux disease, history of complete heart block with status post pacemaker placement, history of paroxysmal A. fib, hypothyroidism, allergic rhinitis, osteoarthritis, recurrent headaches, reflux disease with hiatal hernia, status post right total hip replacement, status post subtotal thyroidectomy, and obesity, who reports snoring and sleep disruption, daytime tiredness and exhaustion. I reviewed your office note from 11/24/2016. Her Epworth sleepiness score is 3 out of 24, fatigue score is 18 out of 63. She is married and lives with her husband. They have no children. She works at [employer]. She is a nonsmoker and drinks alcohol occasionally, caffeine in the form of coffee, 2-3 cups per day on average. She had a CTA head recently on 12/23/16, which I reviewed: IMPRESSION: 1. Normal for age intracranial CTA. Minimal ICA siphon atherosclerosis with no intracranial stenosis, aneurysm, vascular malformation, or circle Willis branch occlusion. No vascular explanation for pulsatile tinnitus. 2.  Normal for age CT appearance of the brain. 3. Middle ears and mastoids appear unremarkable.  She denies a family history of OSA. She is apprehensive about a sleep study because of claustrophobia. Nevertheless, she is willing to get tested. She has a history of paroxysmal A. fib and tends to go briefly into A. fib at night she admits. This is per pacemaker information. She does not typically have  symptoms from this. She snores, particularly when she sleeps on her back. She denies telltale symptoms of restless leg syndrome or recurrent morning headaches but she has had more frequent migrainous headaches. She has had migraines since college. Bedtime is typically around 10:30. Wakeup time around 6. She is currently more stressed out at work. She's preparing exams. She has had some sleep deprivation she admits.  Her Past Medical History Is Significant For: Past Medical History:  Diagnosis Date  . GERD (gastroesophageal reflux disease)   . Hiatal hernia   . Migraines   . Osteoarthritis   . Pacemaker    [facility]  . Paroxysmal A-fib ([place])   . Transient complete heart block 1996    Her Past Surgical History Is Significant For: Past Surgical History:  Procedure Laterality Date  . PACEMAKER GENERATOR CHANGE  03/01/2007   performed by Dr Rosita Fire at [facility])  . PACEMAKER INSERTION  09/08/1994   performed in [facility] for transient complete heart block  . THYROIDECTOMY, PARTIAL  1986   BENIGN NODULES  . TUBAL LIGATION  1987    Her Family History Is Significant For: Family History  Problem Relation Age of Onset  . Diabetes Mellitus II Maternal Uncle   . Cancer Maternal Grandmother        ENDOMETRIAL  . Breast cancer Neg Hx     Her Social History Is Significant For: Social History   Socioeconomic History  . Marital status: Married    Spouse name: None  . Number of children: None  . Years of education: None  . Highest education level: None  Social Needs  . Financial resource strain:  None  . Food insecurity - worry: None  . Food insecurity - inability: None  . Transportation needs - medical: None  . Transportation needs - non-medical: None  Occupational History  . None  Tobacco Use  . Smoking status: Never Smoker  . Smokeless tobacco: Never Used  Substance and Sexual Activity  . Alcohol use: Yes    Alcohol/week: 0.6 oz    Types: 1 Glasses of wine per  week    Comment: occasionally  . Drug use: No  . Sexual activity: None  Other Topics Concern  . 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 Morphine And Related Itching  . Neomycin Other (See Comments)    unknown  . Morphine Hives  . Adhesive [Tape] Rash  :   Her Current Medications Are:  Outpatient Encounter Medications as of 12/28/2016  Medication Sig  . Calcium Carb-Cholecalciferol (CALCIUM/VITAMIN D) 500-200 MG-UNIT TABS Take by mouth.  . diltiazem (CARDIZEM) 30 MG tablet Take 1-2 tablets by mouth every 6 hours as needed for palpitations  . ELIQUIS 5 MG TABS tablet TAKE 1 TABLET TWICE DAILY.  Marland Kitchen Fexofenadine HCl (ALLEGRA PO) Take by mouth.  . levothyroxine (SYNTHROID, LEVOTHROID) 125 MCG tablet Take 125 mcg by mouth daily.   . Multiple Vitamin (MULTI-VITAMINS) TABS Take by mouth. [name]  . omeprazole (PRILOSEC) 20 MG capsule Take 20 mg by mouth daily.  Marland Kitchen OVER THE COUNTER MEDICATION Vitamins - Take as directed  . traMADol (ULTRAM) 50 MG tablet   . UNABLE TO FIND b 12 drops daily   No facility-administered encounter medications on file as of 12/28/2016.   : Review of Systems:  Out of a complete 14 point review of systems, all are reviewed and negative with the exception of these symptoms as listed below: Review of Systems  Neurological:       Pt presents today to discuss her sleep. Pt has never had a sleep study but does endorse snoring.  Epworth Sleepiness Scale 0= would never doze 1= slight chance of dozing 2= moderate chance of dozing 3= high chance of dozing  Sitting and reading: 0 Watching TV: 2 Sitting inactive in a public place (ex. Theater or meeting): 1 As a passenger in a car for an hour without a break: 0 Lying down to rest in the afternoon: 0 Sitting and talking to someone: 0 Sitting quietly after lunch (no alcohol):  0 In a car, while stopped in traffic: 0 Total: 3     Objective:  Neurological Exam  Physical Exam Physical Examination:   Vitals:   12/28/16 1540  BP: 140/84  Pulse: (!) 101    General Examination: The patient is a very pleasant 66 y.o. female in no acute distress. She appears well-developed and well-nourished and well groomed.   HEENT: Normocephalic, atraumatic, pupils are equal, round and reactive to light and accommodation. Extraocular tracking is good without limitation to gaze excursion or nystagmus noted. Normal smooth pursuit is noted. Hearing is grossly intact. Face is symmetric with normal facial animation and normal facial sensation. Speech is clear with no dysarthria noted. There is no hypophonia. There is no lip, neck/head, jaw or voice tremor. Neck is supple with full range of passive and active motion. There are no carotid bruits on auscultation. Oropharynx exam reveals: mild mouth dryness, adequate dental hygiene and  mild airway crowding, due to wider uvula. Mallampati is class I. Tongue protrudes centrally and palate elevates symmetrically. Tonsils are 1+ in size. Neck size is 14 3/8 inches.   Chest: Clear to auscultation without wheezing, rhonchi or crackles noted.  Heart: S1+S2+0, regular and normal without murmurs, rubs or gallops noted.   Abdomen: Soft, non-tender and non-distended with normal bowel sounds appreciated on auscultation.  Extremities: There is no pitting edema in the distal lower extremities bilaterally. Pedal pulses are intact.  Skin: Warm and dry without trophic changes noted.  Musculoskeletal: exam reveals no obvious joint deformities, tenderness or joint swelling or erythema.   Neurologically:  Mental status: The patient is awake, alert and oriented in all 4 spheres. Her immediate and remote memory, attention, language skills and fund of knowledge are appropriate. There is no evidence of aphasia, agnosia, apraxia or anomia. Speech is clear with  normal prosody and enunciation. Thought process is linear. Mood is normal and affect is normal.  Cranial nerves II - XII are as described above under HEENT exam. In addition: shoulder shrug is normal with equal shoulder height noted. Motor exam: Normal bulk, strength and tone is noted. There is no drift, tremor or rebound. reflexes are 1+ throughout. Fine motor skills and coordination: intact grossly.   Cerebellar testing: No dysmetria or intention tremor on finger to nose testing. Heel to shin is unremarkable bilaterally. There is no truncal or gait ataxia.  Sensory exam: intact to light touch in the upper and lower extremities.  Gait, station and balance: She stands easily. No veering to one side is noted. No leaning to one side is noted. Posture is age-appropriate and stance is narrow based. Gait shows normal stride length and normal pace, slight waddle. No problems turning are noted.  Assessment and Plan:   In summary, Kristin [name] is a very pleasant 66 y.o.-year old female with an underlying medical history of reflux disease, history of complete heart block with status post pacemaker placement, history of paroxysmal A. fib, hypothyroidism, allergic rhinitis, osteoarthritis, recurrent headaches, reflux disease with hiatal hernia, status post right total hip replacement, status post subtotal thyroidectomy, and obesity, whose history and physical exam are concerning for obstructive sleep apnea (OSA). I had a long chat with the patient about my findings and the diagnosis of OSA, its prognosis and treatment options. We talked about medical treatments, surgical interventions and non-pharmacological approaches. I explained in particular the risks and ramifications of untreated moderate to severe OSA, especially with respect to developing cardiovascular disease down the Road, including congestive heart failure, difficult to treat hypertension, cardiac arrhythmias, or stroke. Even type 2 diabetes has, in  part, been linked to untreated OSA. Symptoms of untreated OSA include daytime sleepiness, memory problems, mood irritability and mood disorder such as depression and anxiety, lack of energy, as well as recurrent headaches, especially morning headaches. We talked about trying to maintain a healthy lifestyle in general, as well as the importance of weight control. I encouraged the patient to eat healthy, exercise daily and keep well hydrated, to keep a scheduled bedtime and wake time routine, to not skip any meals and eat healthy snacks in between meals. I advised the patient not to drive when feeling sleepy. I recommended the following at this time: sleep study with potential positive airway pressure titration. (We will score hypopneas at 4%).   I explained the sleep test procedure to the patient and also outlined possible surgical and non-surgical treatment options of OSA, including the use  of a custom-made dental device (which would require a referral to a specialist dentist or oral surgeon), upper airway surgical options, such as pillar implants, radiofrequency surgery, tongue base surgery, and UPPP (which would involve a referral to an ENT surgeon). Rarely, jaw surgery such as mandibular advancement may be considered.  I also explained the CPAP treatment option to the patient, who indicated that she would be willing to try CPAP if the need arises. I explained the importance of being compliant with PAP treatment, not only for insurance purposes but primarily to improve Her symptoms, and for the patient's long term health benefit, including to reduce Her cardiovascular risks. I answered all her questions today and the patient was in agreement. I will likely to see her back after the sleep study is completed and encouraged her to call with any interim questions, concerns, problems or updates.   Thank you very much for allowing me to participate in the care of this nice patient. If I can be of any further  assistance to you please do not hesitate to talk to me.   Sincerely,   Star Age, MD, PhD

## 2016-12-28 NOTE — Telephone Encounter (Signed)
Patient called and stated that the chief complaint on the CT angio did not match what she states her complaint was. She wanted to make this was ok even though the results were normal. Questions answered. She verbalized understanding and appreciation for the call.

## 2016-12-28 NOTE — Patient Instructions (Addendum)

## 2017-01-26 ENCOUNTER — Ambulatory Visit (INDEPENDENT_AMBULATORY_CARE_PROVIDER_SITE_OTHER): Payer: BC Managed Care – PPO | Admitting: *Deleted

## 2017-01-26 DIAGNOSIS — I442 Atrioventricular block, complete: Secondary | ICD-10-CM

## 2017-01-27 ENCOUNTER — Encounter: Payer: Self-pay | Admitting: Cardiology

## 2017-01-27 NOTE — Progress Notes (Signed)
Remote pacemaker transmission.   

## 2017-01-27 NOTE — Progress Notes (Signed)
Letter  

## 2017-02-02 LAB — CUP PACEART REMOTE DEVICE CHECK
Battery Impedance: 3149 Ohm
Battery Remaining Longevity: 25 mo
Battery Voltage: 2.74 V
Brady Statistic RV Percent Paced: 0 %
Date Time Interrogation Session: 20190103135207
Implantable Lead Implant Date: 19960815
Implantable Lead Implant Date: 19960815
Implantable Lead Location: 753859
Implantable Lead Location: 753860
Implantable Lead Model: 5024
Implantable Pulse Generator Implant Date: 20090205
Lead Channel Impedance Value: 67 Ohm
Lead Channel Impedance Value: 746 Ohm
Lead Channel Pacing Threshold Amplitude: 0.875 V
Lead Channel Pacing Threshold Pulse Width: 0.4 ms
Lead Channel Setting Pacing Amplitude: 2 V
Lead Channel Setting Pacing Pulse Width: 0.4 ms
Lead Channel Setting Sensing Sensitivity: 2.8 mV

## 2017-02-06 ENCOUNTER — Ambulatory Visit (INDEPENDENT_AMBULATORY_CARE_PROVIDER_SITE_OTHER): Payer: BC Managed Care – PPO | Admitting: Neurology

## 2017-02-06 DIAGNOSIS — R519 Headache, unspecified: Secondary | ICD-10-CM

## 2017-02-06 DIAGNOSIS — G4720 Circadian rhythm sleep disorder, unspecified type: Secondary | ICD-10-CM

## 2017-02-06 DIAGNOSIS — R51 Headache: Secondary | ICD-10-CM

## 2017-02-06 DIAGNOSIS — Z950 Presence of cardiac pacemaker: Secondary | ICD-10-CM

## 2017-02-06 DIAGNOSIS — G4761 Periodic limb movement disorder: Secondary | ICD-10-CM

## 2017-02-06 DIAGNOSIS — I480 Paroxysmal atrial fibrillation: Secondary | ICD-10-CM

## 2017-02-06 DIAGNOSIS — I442 Atrioventricular block, complete: Secondary | ICD-10-CM

## 2017-02-06 DIAGNOSIS — G4733 Obstructive sleep apnea (adult) (pediatric): Secondary | ICD-10-CM

## 2017-02-06 DIAGNOSIS — E669 Obesity, unspecified: Secondary | ICD-10-CM

## 2017-02-06 DIAGNOSIS — R0683 Snoring: Secondary | ICD-10-CM

## 2017-02-08 ENCOUNTER — Other Ambulatory Visit: Payer: Self-pay | Admitting: Neurology

## 2017-02-08 ENCOUNTER — Telehealth: Payer: Self-pay

## 2017-02-08 DIAGNOSIS — R519 Headache, unspecified: Secondary | ICD-10-CM

## 2017-02-08 DIAGNOSIS — G4733 Obstructive sleep apnea (adult) (pediatric): Secondary | ICD-10-CM

## 2017-02-08 DIAGNOSIS — E669 Obesity, unspecified: Secondary | ICD-10-CM

## 2017-02-08 DIAGNOSIS — G4761 Periodic limb movement disorder: Secondary | ICD-10-CM

## 2017-02-08 DIAGNOSIS — R51 Headache: Secondary | ICD-10-CM

## 2017-02-08 NOTE — Telephone Encounter (Signed)
I called pt again to discuss. No answer, left a message asking her to call me back. 

## 2017-02-08 NOTE — Progress Notes (Signed)
Patient referred by Dr. Jaynee Eagles, seen by me on 12/28/16, diagnostic PSG on 02/06/17.    Please call and notify the patient that the recent sleep study did confirm the diagnosis of mild OSA, but she did not sleep very well and had no REM sleep. I recommend treatment for her OSA in the form of CPAP. This will require a repeat sleep study for proper titration and mask fitting. Please explain to patient and arrange for a CPAP titration study. I have placed an order in the chart. Thanks.  Star Age, MD, PhD [facility])

## 2017-02-08 NOTE — Procedures (Signed)
PATIENT'S NAME:  Kristin [name], Kristin [name] DOB:      04-May-1950      MR#:    [id number]     DATE OF RECORDING: 02/06/2017 REFERRING M.D.: Dr. Sarina Ill,             PCP: Billey Chang, MD Study Performed:   Baseline Polysomnogram HISTORY: 67 year old woman with a history of reflux disease, complete heart block with status post pacemaker placement, paroxysmal A. fib, hypothyroidism, allergic rhinitis, osteoarthritis, recurrent headaches, reflux disease with hiatal hernia, status post right total hip replacement, status post subtotal thyroidectomy, and obesity, who reports snoring and sleep disruption, daytime tiredness and exhaustion. The patient endorsed the Epworth Sleepiness Scale at 3 points. The patient's weight 226 pounds with a height of 69 (inches), resulting in a BMI of 33.6 kg/m2. The patient's neck circumference measured 14.25 inches.  CURRENT MEDICATIONS: Cardizem; Eliquis; Allegra; Synthroid; Prilosec; Ultram   PROCEDURE:  This is a multichannel digital polysomnogram utilizing the Somnostar 11.2 system.  Electrodes and sensors were applied and monitored per AASM Specifications.   EEG, EOG, Chin and Limb EMG, were sampled at 200 Hz.  ECG, Snore and Nasal Pressure, Thermal Airflow, Respiratory Effort, CPAP Flow and Pressure, Oximetry was sampled at 50 Hz. Digital video and audio were recorded.      BASELINE STUDY  Lights Out was at 22:29 and Lights On at 05:28.  Total recording time (TRT) was 419.5 minutes, with a total sleep time (TST) of  274 minutes.   The patient's sleep latency was 47.5 minutes, which is delayed. REM sleep was absent. The sleep efficiency was 65.3%, which is reduced.     SLEEP ARCHITECTURE: WASO (Wake after sleep onset) was 97.5 minutes with moderate sleep fragmentation noted. There were 21.5 minutes in Stage N1, 244.5 minutes Stage N2, 8 minutes Stage N3 and 0 minutes in Stage REM.  The percentage of Stage N1 was 7.8%, Stage N2 was 89.2%, which is markedly increased, Stage N3  was 2.9% and Stage R (REM sleep) was absent. The arousals were noted as: 68 were spontaneous, 32 were associated with PLMs, 32 were associated with respiratory events.  Audio and video analysis did not show any abnormal or unusual movements, behaviors, phonations or vocalizations. The patient took no bathroom breaks. Mild snoring was noted. The EKG was in keeping with normal sinus rhythm (NSR).  RESPIRATORY ANALYSIS:  There were a total of 50 respiratory events:  16 obstructive apneas, 8 central apneas and 0 mixed apneas with a total of 24 apneas and an apnea index (AI) of 5.3 /hour. There were 26 hypopneas with a hypopnea index of 5.7 /hour. The patient also had 0 respiratory event related arousals (RERAs).      The total APNEA/HYPOPNEA INDEX (AHI) was 10.9/hour and the total RESPIRATORY DISTURBANCE INDEX was 10.9 /hour.  0 events occurred in REM sleep and 60 events in NREM. The REM AHI was n/a, versus a non-REM AHI of 10.9. The patient spent 0 minutes of total sleep time in the supine position and 274 minutes in non-supine.. The supine AHI was n/a versus a non-supine AHI of 11.0.  OXYGEN SATURATION & C02:  The Wake baseline 02 saturation was 97%, with the lowest being 85%. Time spent below 89% saturation equaled 52 minutes.  PERIODIC LIMB MOVEMENTS: The patient had a total of 85 Periodic Limb Movements.  The Periodic Limb Movement (PLM) index was 18.6 and the PLM Arousal index was 7./hour.  Post-study, the patient indicated that sleep was worse  than usual.   IMPRESSION:  1. Obstructive Sleep Apnea (OSA) 2. Periodic Limb Movement Disorder (PLMD) 3. Dysfunctions associated with sleep stages or arousal from sleep  RECOMMENDATIONS:  1. This study demonstrates overall mild obstructive sleep apnea with a total AHI of 10.9/hour and O2 nadir of 85%. Please note, that the absence of REM sleep and supine sleep during this study likely underestimates her AHI and O2 nadir. Given the patient's medical  history and sleep related complaints, a full-night CPAP titration study is recommended to optimize therapy. Other treatment options may include avoidance of supine sleep position along with weight loss, upper airway or jaw surgery in selected patients or the use of an oral appliance in certain patients. ENT evaluation and/or consultation with a maxillofacial surgeon or dentist may be feasible in some instances.    2. This study shows sleep fragmentation and abnormal sleep stage percentages; these are nonspecific findings and per se do not signify an intrinsic sleep disorder or a cause for the patient's sleep-related symptoms. Causes include (but are not limited to) the first night effect of the sleep study, circadian rhythm disturbances, medication effect or an underlying mood disorder or medical problem.  3. Mild PLMs (periodic limb movements of sleep) were noted during this study with mild arousals; clinical correlation is recommended.  4. The patient should be cautioned not to drive, work at heights, or operate dangerous or heavy equipment when tired or sleepy. Review and reiteration of good sleep hygiene measures should be pursued with any patient. 5. The patient will be seen in follow-up by Dr. Rexene Alberts at [facility] for discussion of the test results and further management strategies. The referring provider will be notified of the test results.  I certify that I have reviewed the entire raw data recording prior to the issuance of this report in accordance with the Standards of Accreditation of the American Academy of Sleep Medicine (AASM)   Star Age, MD, PhD Diplomat, American Board of Psychiatry and Neurology (Neurology and Sleep Medicine)

## 2017-02-08 NOTE — Telephone Encounter (Signed)
I called pt to discuss. No answer, left a message asking her to call me back. 

## 2017-02-08 NOTE — Telephone Encounter (Signed)
-----   Message from Star Age, MD sent at 02/08/2017  7:54 AM EST ----- Patient referred by Dr. Jaynee Eagles, seen by me on 12/28/16, diagnostic PSG on 02/06/17.    Please call and notify the patient that the recent sleep study did confirm the diagnosis of mild OSA, but she did not sleep very well and had no REM sleep. I recommend treatment for her OSA in the form of CPAP. This will require a repeat sleep study for proper titration and mask fitting. Please explain to patient and arrange for a CPAP titration study. I have placed an order in the chart. Thanks.  Star Age, MD, PhD [facility])

## 2017-02-08 NOTE — Telephone Encounter (Signed)
Pt has called RN Cyril Mourning back, she is asking for a returned call

## 2017-02-09 NOTE — Telephone Encounter (Signed)
I called pt. I advised pt that Dr. Rexene Alberts reviewed their sleep study results and found that pt has mild osa, but that she did not sleep very well during her study and did not enter REM sleep. Dr. Rexene Alberts recommends that pt be treated with a cpap. Dr. Rexene Alberts recommends that pt return for a repeat sleep study in order to properly titrate the cpap and ensure a good mask fit. Pt is agreeable to returning for a titration study. I advised pt that our sleep lab will file with pt's insurance and call pt to schedule the sleep study when we hear back from the pt's insurance regarding coverage of this sleep study. Pt verbalized understanding of results. Pt had no questions at this time but was encouraged to call back if questions arise.

## 2017-03-30 ENCOUNTER — Other Ambulatory Visit: Payer: Self-pay | Admitting: Internal Medicine

## 2017-03-31 NOTE — Telephone Encounter (Signed)
Eliquis 5mg  refill request received; pt is 66 yrs old, wt-102.5kg, Crea-0.81 on 11/24/16, last seen by Dr. Rayann Heman on 10/14/16; will send in refill to requested pharmacy.

## 2017-04-17 ENCOUNTER — Ambulatory Visit (INDEPENDENT_AMBULATORY_CARE_PROVIDER_SITE_OTHER): Payer: BC Managed Care – PPO | Admitting: Neurology

## 2017-04-17 DIAGNOSIS — G4733 Obstructive sleep apnea (adult) (pediatric): Secondary | ICD-10-CM

## 2017-04-17 DIAGNOSIS — R519 Headache, unspecified: Secondary | ICD-10-CM

## 2017-04-17 DIAGNOSIS — E669 Obesity, unspecified: Secondary | ICD-10-CM

## 2017-04-17 DIAGNOSIS — G4761 Periodic limb movement disorder: Secondary | ICD-10-CM

## 2017-04-17 DIAGNOSIS — R51 Headache: Secondary | ICD-10-CM

## 2017-04-18 ENCOUNTER — Telehealth: Payer: Self-pay

## 2017-04-18 DIAGNOSIS — G4733 Obstructive sleep apnea (adult) (pediatric): Secondary | ICD-10-CM

## 2017-04-18 NOTE — Procedures (Signed)
PATIENT'S NAME:  Kristin [name], Kristin [name] DOB:      Jul 17, 1950      MR#:    [id number]     DATE OF RECORDING: 04/17/2017 REFERRING M.D.:  Billey Chang, MD Study Performed:   CPAP  Titration HISTORY: 67 year old woman with an underlying medical history of reflux disease, history of complete heart block with status post pacemaker placement, history of paroxysmal A. fib, hypothyroidism, allergic rhinitis, osteoarthritis, recurrent headaches, reflux disease with hiatal hernia, status post right total hip replacement, status post subtotal thyroidectomy, and obesity, who presents for a CPAP titration study. Her baseline PSG on 02/06/17 showed a total AHI of 10.9/hour and O2 nadir of 85%. The patient endorsed the Epworth Sleepiness Scale at 3/24 points. The patient's weight 227 pounds with a height of 69 (inches), resulting in a BMI of 33.6 kg/m2. The patient's neck circumference measured 14 inches.  CURRENT MEDICATIONS: Cardizem; Eliquis; Allegra; Synthroid; Prilosec; Ultram  PROCEDURE:  This is a multichannel digital polysomnogram utilizing the SomnoStar 11.2 system.  Electrodes and sensors were applied and monitored per AASM Specifications.   EEG, EOG, Chin and Limb EMG, were sampled at 200 Hz.  ECG, Snore and Nasal Pressure, Thermal Airflow, Respiratory Effort, CPAP Flow and Pressure, Oximetry was sampled at 50 Hz. Digital video and audio were recorded.      The patient was fitted with a nasal pillows interface, but could not tolerate is. She was then fitted with a medium F20 FFM. CPAP was initiated at 5 cmH20 with heated humidity per AASM standards and pressure was advanced to 7cmH20 because of hypopneas, apneas and desaturations. However, patient did not achieve any significant amount of sleep. Therefore, an optimal treatment pressure was not determined.   Lights Out was at 22:54 and Lights On at 03:09. Total recording time (TRT) was 256 minutes, with a total sleep time (TST) of 52.5 minutes. The patient's sleep  latency was 145.5 minutes. REM sleep and slow wave sleep were absent. The sleep efficiency was only 20.5 %.    SLEEP ARCHITECTURE: WASO (Wake after sleep onset)  was 143.5 minutes.  She had 7 minutes in Stage N1, 45.5 minutes Stage N2, 0 minutes Stage N3 and 0 minutes in Stage REM.  The percentage of Stage N1 was 13.3%, Stage N2 was 86.7%, Stage and Stage R (REM sleep) were absent. The arousals were noted as: 13 were spontaneous, 0 were associated with PLMs, 15 were associated with respiratory events.  Audio and video analysis did not show any abnormal or unusual movements, behaviors, phonations or vocalizations. The patient took 1 bathroom break. The EKG was in keeping with normal sinus rhythm (NSR).  RESPIRATORY ANALYSIS:  There was a total of 15 respiratory events: 0 obstructive apneas, 0 central apneas and 0 mixed apneas with a total of 0 apneas and an apnea index (AI) of 0 /hour. There were 15 hypopneas with a hypopnea index of 17.1/hour. The patient also had 0 respiratory event related arousals (RERAs).      The total APNEA/HYPOPNEA INDEX  (AHI) was 17.1 /hour and the total RESPIRATORY DISTURBANCE INDEX was 17.1 .hour  0 events occurred in REM sleep and 15 events in NREM. The REM AHI was 0 /hour versus a non-REM AHI of 17.1 /hour.  The patient spent 0 minutes of total sleep time in the supine position and 53 minutes in non-supine. The supine AHI was 0.0, versus a non-supine AHI of 17.1.  OXYGEN SATURATION & C02:  The baseline 02 saturation was 96%, with  the lowest being 87%. Time spent below 89% saturation equaled 4 minutes.  PERIODIC LIMB MOVEMENTS: The patient had a total of 0 Periodic Limb Movements. The Periodic Limb Movement (PLM) index was 0 and the PLM Arousal index was 0/hour.  DIAGNOSIS 1. Obstructive Sleep Apnea, Inadequate PAP titration study   PLANS/RECOMMENDATIONS:  This sleep study was inadequate due to very little sleep achieved and absence of REM sleep. Total sleep time was  only 52.5 minutes with a total recording time of over 4 hours. A reliable CPAP pressure for her OSA cannot be based on this study. The patient will be offered a daytime desensitization appointment - PAP nap. I will then order home autoPAP therapy. Please note that untreated obstructive sleep apnea carries additional perioperative morbidity. Patients with significant obstructive sleep apnea should receive perioperative PAP therapy and the surgeons and particularly the anesthesiologist should be informed of the diagnosis and the severity of the sleep disordered breathing. The patient should be cautioned not to drive, work at heights, or operate dangerous or heavy equipment when tired or sleepy. Review and reiteration of good sleep hygiene measures should be pursued with any patient. The patient will be seen in follow-up by Dr. Rexene Alberts at [facility] for discussion of the test results and further management strategies. The referring provider will be notified of the test results.  I certify that I have reviewed the entire raw data recording prior to the issuance of this report in accordance with the Standards of Accreditation of the American Academy of Sleep Medicine (AASM)   Star Age, MD, PhD Diplomat, American Board of Psychiatry and Neurology (Neurology and Sleep Medicine)

## 2017-04-18 NOTE — Telephone Encounter (Signed)
Received pt's sleep study results verbally from Dr. Rexene Alberts. Pt did not sleep well during her titration study, around 52 mins. Dr. Rexene Alberts recommends a daytime PAP nap for desensitization, if pt is agreeable. If pt tplerates this well, an auto pap can be considered.  I called pt and explained this to her. Pt is agreeable to a PAP nap and then considering an auto pap. Pt understands that Shirlean Mylar will reach out to her to schedule this PAP nap. Pt verbalized understanding of results. Pt had no questions at this time but was encouraged to call back if questions arise.

## 2017-04-20 ENCOUNTER — Telehealth: Payer: Self-pay

## 2017-04-20 NOTE — Telephone Encounter (Signed)
Left message to schedule a densensitization to help with use of cpap.

## 2017-04-27 ENCOUNTER — Ambulatory Visit (INDEPENDENT_AMBULATORY_CARE_PROVIDER_SITE_OTHER): Payer: BC Managed Care – PPO | Admitting: *Deleted

## 2017-04-27 DIAGNOSIS — I442 Atrioventricular block, complete: Secondary | ICD-10-CM

## 2017-04-27 NOTE — Progress Notes (Signed)
Remote pacemaker transmission.   

## 2017-05-03 ENCOUNTER — Encounter: Payer: Self-pay | Admitting: Cardiology

## 2017-05-09 LAB — CUP PACEART REMOTE DEVICE CHECK
Battery Impedance: 3324 Ohm
Battery Remaining Longevity: 24 mo
Battery Voltage: 2.73 V
Brady Statistic RV Percent Paced: 0 %
Date Time Interrogation Session: 20190404120617
Implantable Lead Implant Date: 19960815
Implantable Lead Implant Date: 19960815
Implantable Lead Location: 753859
Implantable Lead Location: 753860
Implantable Lead Model: 5024
Implantable Pulse Generator Implant Date: 20090205
Lead Channel Impedance Value: 67 Ohm
Lead Channel Impedance Value: 749 Ohm
Lead Channel Pacing Threshold Amplitude: 1.125 V
Lead Channel Pacing Threshold Pulse Width: 0.4 ms
Lead Channel Setting Pacing Amplitude: 2.25 V
Lead Channel Setting Pacing Pulse Width: 0.4 ms
Lead Channel Setting Sensing Sensitivity: 2.8 mV

## 2017-05-11 DIAGNOSIS — M1612 Unilateral primary osteoarthritis, left hip: Secondary | ICD-10-CM | POA: Insufficient documentation

## 2017-05-18 ENCOUNTER — Telehealth: Payer: Self-pay

## 2017-05-18 DIAGNOSIS — G4733 Obstructive sleep apnea (adult) (pediatric): Secondary | ICD-10-CM

## 2017-05-18 NOTE — Telephone Encounter (Signed)
Patient came to sleep lab for desensitization to cpap. During her cpap titration she could not tolerate mask. Did not sleep and had tpo leave lab. She felt she was suffocating. She was place on P10 nasal pillows and she felt she could not breathe. Then was place on a full face mask and she did not like that. Felt enclosed. I placed her on the Res Med N30 small nasal mask. It fit under nose and headgear is at the top of head where she cannot see it. Hooked her up to cpap of 5. 0 leak. Had her turn on both sides and 0 leak. She liked this mask much better. I let her rest for 15 minutes to see what she thought. She feels she can use this mask. I explained the process of Auto cpap and ways to adjust to cpap. I gave her my business card to call me with any problems once she gets set up. Can we get an Auto order sent to [facility]? Thanks

## 2017-05-18 NOTE — Telephone Encounter (Signed)
I called pt, explained the auto pap process and compliance requirements. Pt is agreeable to starting auto pap. I will send the order to [facility]. An appt was made with pt for 08/23/17 at 8:30am. Pt verbalized understanding of the recommendations and of appt date and time.

## 2017-05-18 NOTE — Telephone Encounter (Signed)
As per PAP desensitization recs, will order autoPAP with N30 small nasal mask. pls send order to DME of choice - pt will need sleep clinic FU in about 12 weeks.

## 2017-06-14 ENCOUNTER — Ambulatory Visit: Payer: BC Managed Care – PPO | Admitting: Family Medicine

## 2017-06-14 ENCOUNTER — Other Ambulatory Visit: Payer: Self-pay

## 2017-06-14 ENCOUNTER — Encounter: Payer: Self-pay | Admitting: Family Medicine

## 2017-06-14 VITALS — BP 124/82 | HR 78 | Temp 98.1°F | Ht 68.0 in | Wt 213.4 lb

## 2017-06-14 DIAGNOSIS — M810 Age-related osteoporosis without current pathological fracture: Secondary | ICD-10-CM | POA: Diagnosis not present

## 2017-06-14 DIAGNOSIS — Z7901 Long term (current) use of anticoagulants: Secondary | ICD-10-CM

## 2017-06-14 DIAGNOSIS — I480 Paroxysmal atrial fibrillation: Secondary | ICD-10-CM

## 2017-06-14 DIAGNOSIS — M1612 Unilateral primary osteoarthritis, left hip: Secondary | ICD-10-CM | POA: Diagnosis not present

## 2017-06-14 DIAGNOSIS — M8580 Other specified disorders of bone density and structure, unspecified site: Secondary | ICD-10-CM

## 2017-06-14 DIAGNOSIS — E890 Postprocedural hypothyroidism: Secondary | ICD-10-CM | POA: Diagnosis not present

## 2017-06-14 DIAGNOSIS — Z780 Asymptomatic menopausal state: Secondary | ICD-10-CM

## 2017-06-14 HISTORY — DX: Other specified disorders of bone density and structure, unspecified site: M85.80

## 2017-06-14 HISTORY — DX: Asymptomatic menopausal state: Z78.0

## 2017-06-14 MED ORDER — CELECOXIB 100 MG PO CAPS: 100.0000 mg | ORAL_CAPSULE | Freq: Every day | ORAL | 5 refills | Status: DC

## 2017-06-14 NOTE — Progress Notes (Signed)
Subjective  CC:  Chief Complaint  Patient presents with  . Establish Care    Transfer from [name], Last Physical 07/2016, HM up to Date  . Hip Pain    wants to discuss getting on Celebrex     HPI: Kristin [name] is a 67 y.o. female is a former [facility] patient and is here to reestablish care with me today.  I have reviewed her records from the last 1-2 years; had cpe June 2018 and labs were all stable.  Seeing Dr. Alvan Dame for left hip OA - considering hip replacement.  She has the following concerns or needs:  OA - s/p steroid injection and fluid aspiration 04/2017; has f/u tomorrow. Considering hip replacement next year however activity is very limited right now due to pain and limp. Was given tramadol for pain but doesn't help at all. 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 Would like to try celebrex. Is using otc aleve once daily with prilosec; helps pain. Is on eloquis for PAF  PAF - has f/u with cards in October to discuss medications. Endorses occ and intermittent palpitations; no associated sob, or cp. No sxs of chf.   OSA - had recent sleep study showing mild disease; considering CPAP  GERD and h/o PUD- on prilosec and sxs are controlled  Reviewed dexa scan findings and update in PL- osteopenia.  Assessment  1. Primary osteoarthritis of left hip   2. Long term current use of anticoagulant   3. Hypothyroidism, postsurgical   4. Paroxysmal atrial fibrillation (HCC)   5. Osteopenia after menopause      Plan   Recommend hip replacement.   Discussed trial of celebrex and riks of cox-2 inhibitor and eloquis and PUD.   Thyroid is stable.   PAF - f/u with cards. Rate controlled.   rec weight bearing exercises - after hip replacement; vit d and calcium  Follow up:  Return in about 3 months (around 09/14/2017) for complete physical.  No orders of the defined types were placed in this encounter.  Meds ordered this encounter   Medications  . celecoxib (CELEBREX) 100 MG capsule    Sig: Take 1 capsule (100 mg total) by mouth daily.    Dispense:  30 capsule    Refill:  5      We updated and reviewed the patient's past history in detail and it is documented below.  Patient Active Problem List   Diagnosis Date Noted  . Complete heart block ([place]) 10/06/2015    Priority: High    Overview:  s/p pacemaker   . Long term current use of anticoagulant 08/05/2015    Priority: High  . Pacemaker 10/14/2014    Priority: High  . Paroxysmal atrial fibrillation ([place]) 10/14/2014    Priority: High  . Hypothyroidism, postsurgical 01/08/2013    Priority: High  . Osteoarthritis of left hip 05/11/2017    Priority: Medium  . GERD (gastroesophageal reflux disease) 01/08/2013    Priority: Medium    Overview:  H/o esophageal stricture from nsaids s/p endoscopy 2007, Dr. Penelope Coop   . OA (osteoarthritis) 01/08/2013    Priority: Medium  . History of peptic ulcer disease - nsaid 01/08/2013    Priority: Medium    Overview:  Due to Nsaids   . Diverticulosis of large intestine without hemorrhage 01/04/2016    Priority: Low  . Internal hemorrhoids without complication 99/37/1696    Priority: Low  .  AR (allergic rhinitis) 01/08/2013    Priority: Low  . Osteopenia after menopause 06/14/2017    DEXA 08/2016 [facility]: T = -1.10 radius, -1.0 femur; lumbar spine elevated due to DJD; recheck 2-3 years.    Health Maintenance  Topic Date Due  . PNA vac Low Risk Adult (2 of 2 - PPSV23) 08/17/2017  . INFLUENZA VACCINE  08/24/2017  . MAMMOGRAM  09/15/2017  . DEXA SCAN  09/17/2018  . PAP SMEAR  08/04/2020  . TETANUS/TDAP  07/02/2023  . COLONOSCOPY  12/28/2025  . Hepatitis C Screening  Completed  . HIV Screening  Discontinued   Immunization History  Administered Date(s) Administered  . Hepatitis B, ped/adol 11/24/2009  . Influenza, High Dose Seasonal PF 11/01/2016  . Influenza, Quadrivalent, Recombinant, Inj, Pf 01/08/2013  .  Influenza, Seasonal, Injecte, Preservative Fre 11/11/2014  . Influenza,inj,Quad PF,6+ Mos 12/24/2015  . Pneumococcal Conjugate-13 08/17/2016  . Tdap 09/07/2010, 09/07/2010, 07/01/2013  . Zoster 01/08/2013   Current Meds  Medication Sig  . Calcium Carb-Cholecalciferol (CALCIUM/VITAMIN D) 500-200 MG-UNIT TABS Take by mouth.  Arne Cleveland 5 MG TABS tablet TAKE 1 TABLET BY MOUTH TWICE DAILY.  Marland Kitchen Fexofenadine HCl (ALLEGRA PO) Take by mouth.  . levothyroxine (SYNTHROID, LEVOTHROID) 125 MCG tablet Take 125 mcg by mouth daily.   . Multiple Vitamin (MULTI-VITAMINS) TABS Take by mouth. [name]  . omeprazole (PRILOSEC) 20 MG capsule Take 40 mg by mouth daily.   Marland Kitchen OVER THE COUNTER MEDICATION Vitamins - Take as directed  . traMADol (ULTRAM) 50 MG tablet   . UNABLE TO FIND b 12 drops daily    Allergies: Patient is allergic to morphine and related; neomycin; other; morphine; and adhesive [tape]. Past Medical History Patient  has a past medical history of AR (allergic rhinitis), Atrial fibrillation ([name]), GERD (gastroesophageal reflux disease), Hiatal hernia, Hiatal hernia, Migraines, Osteoarthritis, Osteoarthritis, Osteopenia after menopause (06/14/2017), Pacemaker, Paroxysmal A-fib ([place]), PUD (peptic ulcer disease), Thyroid disease, and Transient complete heart block (1996). Past Surgical History Patient  has a past surgical history that includes Thyroidectomy, partial (1986); Tubal ligation (1987); Pacemaker insertion (09/08/1994); Pacemaker generator change (03/01/2007); and Hip Arthroplasty. Family History: Patient family history includes Arthritis in her mother; COPD in her brother; Cancer in her maternal grandmother; Diabetes Mellitus II in her maternal uncle; Drug abuse in her brother. Social History:  Patient  reports that she has never smoked. She has never used smokeless tobacco. She reports that she drinks about 0.6 oz of alcohol per week. She reports that she does not use  drugs.  Review of Systems: Constitutional: negative for fever or malaise Ophthalmic: negative for photophobia, double vision or loss of vision Cardiovascular: negative for chest pain, dyspnea on exertion, or new LE swelling Respiratory: negative for SOB or persistent cough Gastrointestinal: negative for abdominal pain, change in bowel habits or melena Genitourinary: negative for dysuria or gross hematuria Musculoskeletal: negative for new gait disturbance or muscular weakness Integumentary: negative for new or persistent rashes Neurological: negative for TIA or stroke symptoms Psychiatric: negative for SI or delusions Allergic/Immunologic: negative for hives  Patient Care Team    Relationship Specialty Notifications Start End  Leamon Arnt, MD PCP - General Family Medicine  06/14/17   Rytlewski, Melba Coon, MD Consulting Physician Cardiology  06/14/17 06/14/17  Melvenia Beam, MD Consulting Physician Neurology  06/14/17   Paralee Cancel, [place] Physician Orthopedic Surgery  06/14/17   [name], MD Consulting Physician Cardiology  06/14/17   Olean Ree  K, MD Consulting Physician Gastroenterology  06/14/17     Objective  Vitals: BP 124/82   Pulse 78   Temp 98.1 F (36.7 C)   Ht 5\' 8"  (1.727 m)   Wt 213 lb 6.4 oz (96.8 kg)   BMI 32.45 kg/m  General:  Well developed, well nourished, no acute distress  Psych:  Alert and oriented,normal mood and affect HEENT:  Normocephalic, atraumatic, non-icteric sclera, PERRL, oropharynx is without mass or exudate, supple neck without adenopathy, mass or thyromegaly Cardiovascular:  RRR without gallop, rub or murmur, nondisplaced PMI Respiratory:  Good breath sounds bilaterally, CTAB with normal respiratory effort MSK: no deformities, contusions. Joints are without erythema or swelling Skin:  Warm, no rashes or suspicious lesions noted  Commons side effects, risks, benefits, and alternatives for medications and treatment plan  prescribed today were discussed, and the patient expressed understanding of the given instructions. Patient is instructed to call or message via MyChart if he/she has any questions or concerns regarding our treatment plan. No barriers to understanding were identified. We discussed Red Flag symptoms and signs in detail. Patient expressed understanding regarding what to do in case of urgent or emergency type symptoms.   Medication list was reconciled, printed and provided to the patient in AVS. Patient instructions and summary information was reviewed with the patient as documented in the AVS. This note was prepared with assistance of Dragon voice recognition software. Occasional wrong-word or sound-a-like substitutions may have occurred due to the inherent limitations of voice recognition software

## 2017-06-14 NOTE — Patient Instructions (Addendum)
It was so good seeing you again! Thank you for establishing with my new practice and allowing me to continue caring for you. It means a lot to me.   Please schedule a follow up appointment with me in 3 months for your physical.

## 2017-06-20 ENCOUNTER — Telehealth: Payer: Self-pay | Admitting: Internal Medicine

## 2017-06-20 NOTE — Telephone Encounter (Signed)
Call returned to Pt. Per Pt her hip has deteriorated to the point she needs replacement.  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  Plans to have it next January. Pt has hip pain that is only eased by anti-inflammatory medications.  Currently taking celebrex 100 mg daily. Pt is concerned about needing additional NSAID's while on Eliquis. Unsure if she should continue Eliquis?  Discontinue until after hip replacement?  Take 1/2 dose so she can increase NSAID's? Will discuss with Dr. Rayann Heman for further advisement.

## 2017-06-20 NOTE — Telephone Encounter (Signed)
New message    Pt c/o medication issue:  1. Name of Medication: ELIQUIS 5 MG TABS tablet  2. How are you currently taking this medication (dosage and times per day)? TAKE 1 TABLET BY MOUTH TWICE DAILY.  3. Are you having a reaction (difficulty breathing--STAT)? No   4. What is your medication issue? Have a question relate to medication - patient stated it's easier to explain to nurse when she called back

## 2017-06-22 NOTE — Telephone Encounter (Signed)
Returned call to Pt.  Advised Pt this nurse had shown Dr. Rayann Heman her medication list with the newly added Celebrex.  Per Dr. Rayann Heman if Pt needed to take anything additional besides the Celebrex have Pt's PCP give him a call to discuss.   Advised Pt of s/s of bleeding to be watchful for and advised to call office if she has any bruising, dark stools or bleeding of gums.  Pt indicates understanding.

## 2017-07-03 ENCOUNTER — Telehealth: Payer: Self-pay

## 2017-07-03 NOTE — Telephone Encounter (Signed)
Patient called and had a bad experience with [facility]. She went for her appointment and found that it was a group set up and did not like that so she left. She left several messages with them and did not get a return call. She does not want to persue this at this time. She has lost 15lbs and will continue to lose more weight. I explained that we can send this to another DME company if she likes. She would rather try weight lost first. I told her to call me in afew months and we can repeat a home study with weight loss to see if apnea is still present. She needs to cancel her follow-up appointment at end of July

## 2017-07-04 NOTE — Telephone Encounter (Signed)
Pt's appt for the end of July has been cancelled.

## 2017-08-11 ENCOUNTER — Other Ambulatory Visit: Payer: Self-pay | Admitting: Family Medicine

## 2017-08-11 DIAGNOSIS — Z1231 Encounter for screening mammogram for malignant neoplasm of breast: Secondary | ICD-10-CM

## 2017-08-14 ENCOUNTER — Ambulatory Visit (INDEPENDENT_AMBULATORY_CARE_PROVIDER_SITE_OTHER): Payer: BC Managed Care – PPO | Admitting: *Deleted

## 2017-08-14 DIAGNOSIS — I442 Atrioventricular block, complete: Secondary | ICD-10-CM

## 2017-08-14 NOTE — Progress Notes (Signed)
Remote pacemaker transmission.   

## 2017-08-15 ENCOUNTER — Encounter: Payer: Self-pay | Admitting: Cardiology

## 2017-08-23 ENCOUNTER — Ambulatory Visit: Payer: Self-pay | Admitting: Neurology

## 2017-08-30 ENCOUNTER — Encounter: Payer: BC Managed Care – PPO | Admitting: Family Medicine

## 2017-09-08 LAB — CUP PACEART REMOTE DEVICE CHECK
Battery Impedance: 3212 Ohm
Battery Remaining Longevity: 25 mo
Battery Voltage: 2.73 V
Brady Statistic RV Percent Paced: 0 %
Date Time Interrogation Session: 20190722141140
Implantable Lead Implant Date: 19960815
Implantable Lead Implant Date: 19960815
Implantable Lead Location: 753859
Implantable Lead Location: 753860
Implantable Lead Model: 5024
Implantable Pulse Generator Implant Date: 20090205
Lead Channel Impedance Value: 67 Ohm
Lead Channel Impedance Value: 797 Ohm
Lead Channel Pacing Threshold Amplitude: 0.875 V
Lead Channel Pacing Threshold Pulse Width: 0.4 ms
Lead Channel Setting Pacing Amplitude: 2.25 V
Lead Channel Setting Pacing Pulse Width: 0.4 ms
Lead Channel Setting Sensing Sensitivity: 2.8 mV

## 2017-09-15 ENCOUNTER — Telehealth: Payer: Self-pay | Admitting: Family Medicine

## 2017-09-15 NOTE — Telephone Encounter (Signed)
Copied from [place] [phone]. Topic: Quick Communication - Rx Refill/Question >> Sep 15, 2017  8:22 AM Burchel, Abbi R wrote: Medication: omeprazole (PRILOSEC) 20 MG capsule  Pt is requesting that Dr Jonni Sanger change this Rx so that she can take 2 of these capsules when she takes her Celebrex (she does not always the take Celebrex every day).  Please advise.   Preferred Pharmacy: [facility], [address] [place] [address] Phone: [phone] Fax: [phone]   Pt was advised that RX refills may take up to 3 business days.

## 2017-09-15 NOTE — Telephone Encounter (Signed)
Please advise.   Cady Hafen,  LPN   

## 2017-09-18 MED ORDER — OMEPRAZOLE 40 MG PO CPDR: 40.0000 mg | DELAYED_RELEASE_CAPSULE | Freq: Every day | ORAL | 3 refills | Status: DC

## 2017-09-18 NOTE — Telephone Encounter (Signed)
Received and reviewed medication refill request.  Request is appropriate and was approved.  Please see medication orders for details.  

## 2017-09-18 NOTE — Telephone Encounter (Signed)
Please change to 40mg  daily prn. # 180 with 3 rf.

## 2017-09-20 ENCOUNTER — Ambulatory Visit
Admission: RE | Admit: 2017-09-20 | Discharge: 2017-09-20 | Disposition: A | Discharge status: Home / Self Care | Payer: BC Managed Care – PPO | Source: Ambulatory Visit | Attending: Family Medicine | Admitting: Family Medicine

## 2017-09-20 DIAGNOSIS — Z1231 Encounter for screening mammogram for malignant neoplasm of breast: Secondary | ICD-10-CM

## 2017-10-02 ENCOUNTER — Other Ambulatory Visit: Payer: Self-pay | Admitting: Family Medicine

## 2017-10-20 ENCOUNTER — Encounter: Payer: Self-pay | Admitting: Family Medicine

## 2017-10-20 ENCOUNTER — Other Ambulatory Visit: Payer: Self-pay

## 2017-10-20 ENCOUNTER — Encounter: Payer: Self-pay | Admitting: Internal Medicine

## 2017-10-20 ENCOUNTER — Ambulatory Visit (INDEPENDENT_AMBULATORY_CARE_PROVIDER_SITE_OTHER): Payer: BC Managed Care – PPO | Admitting: Family Medicine

## 2017-10-20 ENCOUNTER — Ambulatory Visit (INDEPENDENT_AMBULATORY_CARE_PROVIDER_SITE_OTHER): Payer: BC Managed Care – PPO | Admitting: Internal Medicine

## 2017-10-20 VITALS — BP 110/74 | HR 78 | Ht 68.0 in | Wt 197.8 lb

## 2017-10-20 VITALS — BP 124/80 | HR 74 | Temp 98.1°F | Ht 68.0 in | Wt 200.2 lb

## 2017-10-20 DIAGNOSIS — G4733 Obstructive sleep apnea (adult) (pediatric): Secondary | ICD-10-CM

## 2017-10-20 DIAGNOSIS — Z23 Encounter for immunization: Secondary | ICD-10-CM | POA: Diagnosis not present

## 2017-10-20 DIAGNOSIS — I442 Atrioventricular block, complete: Secondary | ICD-10-CM | POA: Diagnosis not present

## 2017-10-20 DIAGNOSIS — I480 Paroxysmal atrial fibrillation: Secondary | ICD-10-CM

## 2017-10-20 DIAGNOSIS — E890 Postprocedural hypothyroidism: Secondary | ICD-10-CM

## 2017-10-20 DIAGNOSIS — Z950 Presence of cardiac pacemaker: Secondary | ICD-10-CM

## 2017-10-20 DIAGNOSIS — K219 Gastro-esophageal reflux disease without esophagitis: Secondary | ICD-10-CM

## 2017-10-20 DIAGNOSIS — Z7901 Long term (current) use of anticoagulants: Secondary | ICD-10-CM

## 2017-10-20 DIAGNOSIS — M8580 Other specified disorders of bone density and structure, unspecified site: Secondary | ICD-10-CM

## 2017-10-20 DIAGNOSIS — M1612 Unilateral primary osteoarthritis, left hip: Secondary | ICD-10-CM

## 2017-10-20 DIAGNOSIS — Z780 Asymptomatic menopausal state: Secondary | ICD-10-CM

## 2017-10-20 DIAGNOSIS — M810 Age-related osteoporosis without current pathological fracture: Secondary | ICD-10-CM

## 2017-10-20 DIAGNOSIS — Z0000 Encounter for general adult medical examination without abnormal findings: Secondary | ICD-10-CM | POA: Diagnosis not present

## 2017-10-20 LAB — COMPREHENSIVE METABOLIC PANEL
ALT: 12 U/L (ref 0–35)
AST: 15 U/L (ref 0–37)
Albumin: 4.3 g/dL (ref 3.5–5.2)
Alkaline Phosphatase: 80 U/L (ref 39–117)
BUN: 28 mg/dL — ABNORMAL HIGH (ref 6–23)
CO2: 29 mEq/L (ref 19–32)
Calcium: 9.9 mg/dL (ref 8.4–10.5)
Chloride: 101 mEq/L (ref 96–112)
Creatinine, Ser: 0.83 mg/dL (ref 0.40–1.20)
GFR: 73.04 mL/min (ref >60.00)
Glucose, Bld: 91 mg/dL (ref 70–99)
Potassium: 4.8 mEq/L (ref 3.5–5.1)
Sodium: 139 mEq/L (ref 135–145)
Total Bilirubin: 0.6 mg/dL (ref 0.2–1.2)
Total Protein: 7 g/dL (ref 6.0–8.3)

## 2017-10-20 LAB — CBC WITH DIFFERENTIAL/PLATELET
Basophils Absolute: 0 10*3/uL (ref 0.0–0.1)
Basophils Relative: 0.8 % (ref 0.0–3.0)
Eosinophils Absolute: 0 10*3/uL (ref 0.0–0.7)
Eosinophils Relative: 0.3 % (ref 0.0–5.0)
HCT: 37.1 % (ref 36.0–46.0)
Hemoglobin: 12.3 g/dL (ref 12.0–15.0)
Lymphocytes Relative: 21.8 % (ref 12.0–46.0)
Lymphs Abs: 1.3 10*3/uL (ref 0.7–4.0)
MCHC: 33.1 g/dL (ref 30.0–36.0)
MCV: 92.2 fl (ref 78.0–100.0)
Monocytes Absolute: 0.4 10*3/uL (ref 0.1–1.0)
Monocytes Relative: 7.6 % (ref 3.0–12.0)
Neutro Abs: 4.1 10*3/uL (ref 1.4–7.7)
Neutrophils Relative %: 69.5 % (ref 43.0–77.0)
Platelets: 241 10*3/uL (ref 150.0–400.0)
RBC: 4.03 Mil/uL (ref 3.87–5.11)
RDW: 14.6 % (ref 11.5–15.5)
WBC: 5.9 10*3/uL (ref 4.0–10.5)

## 2017-10-20 LAB — LIPID PANEL
Cholesterol: 162 mg/dL (ref 0–200)
HDL: 71 mg/dL (ref >39.00)
LDL Cholesterol: 74 mg/dL (ref 0–99)
NonHDL: 91.37
Total CHOL/HDL Ratio: 2
Triglycerides: 88 mg/dL (ref 0.0–149.0)
VLDL: 17.6 mg/dL (ref 0.0–40.0)

## 2017-10-20 LAB — TSH: TSH: 0.39 u[IU]/mL (ref 0.35–4.50)

## 2017-10-20 NOTE — Progress Notes (Signed)
Subjective  Chief Complaint  Patient presents with  . Annual Exam    doing well, requests flu and Pneumonia vaccine  . Skin Problem    Spots on both upper thighs     HPI: Kristin [name] is a 67 y.o. female who presents to [place] at [facility] today for a Female Wellness Visit. She also has the concerns and/or needs as listed above in the chief complaint. These will be addressed in addition to the Health Maintenance Visit.   Wellness Visit: annual visit with health maintenance review and exam with Pap   Health maintenance: Mammogram up-to-date.  Recent colonoscopy done and okay.  Immunizations due: Flu shot and Pneumovax.  Also can get second Shingrix at her convenience.  Healthy.  Nutrition, limited exercise due to osteoarthritis of hip Chronic disease f/u and/or acute problem visit: (deemed necessary to be done in addition to the wellness visit):  Arthritis hip: End-stage, steroid injection yesterday.  Feeling better.  Celebrex helped take the edge off.  GERD symptoms are well controlled on high-dose Protonix even in the setting of Celebrex.  She will schedule hip replacement in January.  PAF on long-term anticoagulation without symptoms of bleeding.  Rare palpitation.  Sees cardiology today for follow-up.  Hypothyroidism, postsurgical: Due for recheck today.  Clinically euthyroid.  Reviewed osteopenia bone density results.  Mild OSA, not using CPAP.  Assessment  1. Annual physical exam   2. Hypothyroidism, postsurgical   3. Long term current use of anticoagulant   4. Paroxysmal atrial fibrillation (HCC)   5. Primary osteoarthritis of left hip   6. Osteopenia after menopause   7. OSA (obstructive sleep apnea)   8. Gastroesophageal reflux disease, esophagitis presence not specified      Plan  Female Wellness Visit:  Age appropriate Health Maintenance and Prevention measures were discussed with patient. Included topics are cancer screening  recommendations, ways to keep healthy (see AVS) including dietary and exercise recommendations, regular eye and dental care, use of seat belts, and avoidance of moderate alcohol use and tobacco use.   BMI: discussed patient's BMI and encouraged positive lifestyle modifications to help get to or maintain a target BMI.  HM needs and immunizations were addressed and ordered. See below for orders. See HM and immunization section for updates.  Flu shot in Pneumovax today.  Recommend Shingrix at her convenience.  Routine labs and screening tests ordered including cmp, cbc and lipids where appropriate.  Discussed recommendations regarding Vit D and calcium supplementation (see AVS)  Chronic disease management visit and/or acute problem visit:  Hip arthritis: Improving.  Will only use Celebrex if needed.  Continue Protonix until after surgery.  Has history of GI bleed.  Paroxysmal A. fib: Stable on medications.  Follow-up with cardiology  Follow up: Return in about 1 year (around 10/21/2018) for complete physical.  Orders Placed This Encounter  Procedures  . Flu vaccine HIGH DOSE PF  . Pneumococcal polysaccharide vaccine 23-valent greater than or equal to 2yo subcutaneous/IM  . CBC with Differential/Platelet  . Comprehensive metabolic panel  . Lipid panel  . TSH   No orders of the defined types were placed in this encounter.     Lifestyle: Body mass index is 30.44 kg/m. Wt Readings from Last 3 Encounters:  10/20/17 200 lb 3.2 oz (90.8 kg)  06/14/17 213 lb 6.4 oz (96.8 kg)  12/28/16 226 lb (102.5 kg)   Diet: low fat   Patient Active Problem List   Diagnosis Date Noted  .  Complete heart block ([place]) 10/06/2015    Priority: High    Overview:  s/p pacemaker   . Long term current use of anticoagulant 08/05/2015    Priority: High  . Pacemaker 10/14/2014    Priority: High  . Paroxysmal atrial fibrillation ([place]) 10/14/2014    Priority: High  . Hypothyroidism, postsurgical  01/08/2013    Priority: High  . OSA (obstructive sleep apnea) 10/20/2017    Priority: Medium    Class: Chronic  . Osteopenia after menopause 06/14/2017    Priority: Medium    DEXA 08/2016 [facility]: T = -1.10 radius, -1.0 femur; lumbar spine elevated due to DJD; recheck 2-3 years.   . Osteoarthritis of left hip 05/11/2017    Priority: Medium  . GERD (gastroesophageal reflux disease) 01/08/2013    Priority: Medium    Overview:  H/o esophageal stricture from nsaids s/p endoscopy 2007, Dr. Penelope Coop   . OA (osteoarthritis) 01/08/2013    Priority: Medium  . History of peptic ulcer disease - nsaid 01/08/2013    Priority: Medium    Overview:  Due to Nsaids   . Diverticulosis of large intestine without hemorrhage 01/04/2016    Priority: Low  . Internal hemorrhoids without complication 67/20/9470    Priority: Low  . AR (allergic rhinitis) 01/08/2013    Priority: Low   Health Maintenance  Topic Date Due  . PNA vac Low Risk Adult (2 of 2 - PPSV23) 08/17/2017  . INFLUENZA VACCINE  08/24/2017  . DEXA SCAN  09/17/2018  . MAMMOGRAM  09/21/2018  . TETANUS/TDAP  07/02/2023  . COLONOSCOPY  12/28/2025  . Hepatitis C Screening  Completed   Immunization History  Administered Date(s) Administered  . Hepatitis B, ped/adol 11/24/2009  . Influenza, High Dose Seasonal PF 11/01/2016, 10/20/2017  . Influenza, Quadrivalent, Recombinant, Inj, Pf 01/08/2013  . Influenza, Seasonal, Injecte, Preservative Fre 11/11/2014  . Influenza,inj,Quad PF,6+ Mos 12/24/2015  . Pneumococcal Conjugate-13 08/17/2016  . Tdap 09/07/2010, 09/07/2010, 07/01/2013  . Zoster 01/08/2013  . Zoster Recombinat (Shingrix) 07/26/2017   We updated and reviewed the patient's past history in detail and it is documented below. Allergies: Patient is allergic to morphine and related; neomycin; other; morphine; and adhesive [tape]. Past Medical History Patient  has a past medical history of AR (allergic rhinitis), Atrial fibrillation  ([place]), GERD (gastroesophageal reflux disease), Hiatal hernia, Hiatal hernia, Migraines, Osteoarthritis, Osteoarthritis, Osteopenia after menopause (06/14/2017), Pacemaker, Paroxysmal A-fib ([place]), PUD (peptic ulcer disease), Thyroid disease, and Transient complete heart block (1996). Past Surgical History Patient  has a past surgical history that includes Thyroidectomy, partial (1986); Tubal ligation (1987); Pacemaker insertion (09/08/1994); Pacemaker generator change (03/01/2007); and Hip Arthroplasty. Family History: Patient family history includes Arthritis in her mother; COPD in her brother; Cancer in her maternal grandmother; Diabetes Mellitus II in her maternal uncle; Drug abuse in her brother. Social History:  Patient  reports that she has never smoked. She has never used smokeless tobacco. She reports that she drinks about 1.0 standard drinks of alcohol per week. She reports that she does not use drugs.  Review of Systems: Constitutional: negative for fever or malaise Ophthalmic: negative for photophobia, double vision or loss of vision Cardiovascular: negative for chest pain, dyspnea on exertion, or new LE swelling Respiratory: negative for SOB or persistent cough Gastrointestinal: negative for abdominal pain, change in bowel habits or melena Genitourinary: negative for dysuria or gross hematuria, no abnormal uterine bleeding or disharge Musculoskeletal: negative for new gait disturbance or muscular weakness Integumentary: negative for new or  persistent rashes, no breast lumps Neurological: negative for TIA or stroke symptoms Psychiatric: negative for SI or delusions Allergic/Immunologic: negative for hives  Patient Care Team    Relationship Specialty Notifications Start End  Leamon Arnt, MD PCP - General Family Medicine  06/14/17   Melvenia Beam, MD Consulting Physician Neurology  06/14/17   Paralee Cancel, MD Consulting Physician Orthopedic Surgery  06/14/17   [name], MD  Consulting Physician Cardiology  06/14/17   Efrain Sella, MD Consulting Physician Gastroenterology  06/14/17     Objective  Vitals: BP 124/80   Pulse 74   Temp 98.1 F (36.7 C)   Ht 5\' 8"  (1.727 m)   Wt 200 lb 3.2 oz (90.8 kg)   SpO2 96%   BMI 30.44 kg/m  General:  Well developed, well nourished, no acute distress  Psych:  Alert and orientedx3,normal mood and affect HEENT:  Normocephalic, atraumatic, non-icteric sclera, PERRL, oropharynx is clear without mass or exudate, supple neck without adenopathy, mass or thyromegaly Cardiovascular:  Normal S1, S2, RRR without gallop, rub or murmur, nondisplaced PMI Respiratory:  Good breath sounds bilaterally, CTAB with normal respiratory effort Gastrointestinal: normal bowel sounds, soft, non-tender, no noted masses. No HSM MSK: Abnormal gait due to hip arthritis, no contusions. Joints are without erythema or swelling. Spine and CVA region are nontender Skin:  Warm, no rashes or suspicious lesions noted Neurologic:    Mental status is normal. CN 2-11 are normal. Gross motor and sensory exams are normal. Normal gait. No tremor Breast Exam: No mass, skin retraction or nipple discharge is appreciated in either breast. No axillary adenopathy. Fibrocystic changes are not noted   Commons side effects, risks, benefits, and alternatives for medications and treatment plan prescribed today were discussed, and the patient expressed understanding of the given instructions. Patient is instructed to call or message via MyChart if he/she has any questions or concerns regarding our treatment plan. No barriers to understanding were identified. We discussed Red Flag symptoms and signs in detail. Patient expressed understanding regarding what to do in case of urgent or emergency type symptoms.   Medication list was reconciled, printed and provided to the patient in AVS. Patient instructions and summary information was reviewed with the patient as documented in the  AVS. This note was prepared with assistance of Dragon voice recognition software. Occasional wrong-word or sound-a-like substitutions may have occurred due to the inherent limitations of voice recognition software

## 2017-10-20 NOTE — Patient Instructions (Addendum)
Medication Instructions:  Your physician recommends that you continue on your current medications as directed. Please refer to the Current Medication list given to you today.  Labwork: None ordered.  Testing/Procedures: None ordered.  Follow-Up: Your physician wants you to follow-up in: one year with Chanetta Marshall, NP.   You will receive a reminder letter in the mail two months in advance. If you don't receive a letter, please call our office to schedule the follow-up appointment.  Remote monitoring is used to monitor your Pacemaker from home. This monitoring reduces the number of office visits required to check your device to one time per year. It allows [place] to keep an eye on the functioning of your device to ensure it is working properly. You are scheduled for a device check from home on 11/13/2017. You may send your transmission at any time that day. If you have a wireless device, the transmission will be sent automatically. After your physician reviews your transmission, you will receive a postcard with your next transmission date.  Any Other Special Instructions Will Be Listed Below (If Applicable).  If you need a refill on your cardiac medications before your next appointment, please call your pharmacy.

## 2017-10-20 NOTE — Progress Notes (Signed)
PCP: Leamon Arnt, MD   Primary EP:  Kristin [name] is a 67 y.o. female who presents today for routine electrophysiology followup.  Since last being seen in our clinic, the patient reports doing very well.  Today, she denies symptoms of palpitations, chest pain, shortness of breath,  lower extremity edema, dizziness, presyncope, or syncope.  The patient is otherwise without complaint today.   Past Medical History:  Diagnosis Date  . AR (allergic rhinitis)   . Atrial fibrillation ([place])   . GERD (gastroesophageal reflux disease)   . Hiatal hernia   . Hiatal hernia   . Migraines   . Osteoarthritis   . Osteoarthritis   . Osteopenia after menopause 06/14/2017   DEXA 08/2016 [facility]: T = -1.10 radius, -1.0 femur; lumbar spine elevated due to DJD; recheck 2-3 years.  . Pacemaker    CHB  . Paroxysmal A-fib ([place])   . PUD (peptic ulcer disease)   . Thyroid disease   . Transient complete heart block 1996   Past Surgical History:  Procedure Laterality Date  . HIP ARTHROPLASTY    . PACEMAKER GENERATOR CHANGE  03/01/2007   performed by Kristin Rosita Fire at [facility])  . PACEMAKER INSERTION  09/08/1994   performed in [facility] for transient complete heart block  . THYROIDECTOMY, PARTIAL  1986   BENIGN NODULES  . TUBAL LIGATION  1987    ROS- all systems are reviewed and negative except as per HPI above  Current Outpatient Medications  Medication Sig Dispense Refill  . amoxicillin (AMOXIL) 500 MG capsule     . Calcium Carb-Cholecalciferol (CALCIUM/VITAMIN D) 500-200 MG-UNIT TABS Take by mouth.    . celecoxib (CELEBREX) 100 MG capsule Take 1 capsule (100 mg total) by mouth daily. 30 capsule 5  . diltiazem (CARDIZEM) 30 MG tablet Take 1-2 tablets by mouth every 6 hours as needed for palpitations 30 tablet 3  . ELIQUIS 5 MG TABS tablet TAKE 1 TABLET BY MOUTH TWICE DAILY. 180 tablet 2  . Fexofenadine HCl (ALLEGRA PO) Take by mouth.    . levothyroxine (SYNTHROID,  LEVOTHROID) 125 MCG tablet Take 125 mcg by mouth daily.     . Multiple Vitamin (MULTI-VITAMINS) TABS Take by mouth. [name]    . omeprazole (PRILOSEC) 40 MG capsule Take 1 capsule (40 mg total) by mouth daily. 180 capsule 3  . OVER THE COUNTER MEDICATION Vitamins - Take as directed    . UNABLE TO FIND b 12 drops daily     No current facility-administered medications for this visit.     Physical Exam: Vitals:   10/20/17 1149  BP: 110/74  Pulse: 78  SpO2: 96%  Weight: 197 lb 12.8 oz (89.7 kg)  Height: 5\' 8"  (1.727 m)    GEN- The patient is well appearing, alert and oriented x 3 today.   Head- normocephalic, atraumatic Eyes-  Sclera clear, conjunctiva pink Ears- hearing intact Oropharynx- clear Lungs- Clear to ausculation bilaterally, normal work of breathing Chest- pacemaker pocket is well healed Heart- Regular rate and rhythm, no murmurs, rubs or gallops, PMI not laterally displaced GI- soft, NT, ND, + BS Extremities- no clubbing, cyanosis, or edema  Pacemaker interrogation- reviewed in detail today,  See PACEART report  ekg tracing ordered today is personally reviewed and shows sinus rhythm 78 bpm, otherwise normal ekg  Assessment and Plan:  1. Remote transient complete heart block (21 years ago or so) Normal pacemaker function  See Kristin [name] Art report No changes today She does not V pace and is programmed VVI 35 bpm to preserve battery on her device.  We have discussed multiple times that her pacing need is very low.  She still would like to consider generator change when ERI which I think is very reasonable.  2. Paroxysmal atrial fibrillation afib burden is low.  She is programmed VVI 35 bpm to preserve battery on her device chads2vasc score is 2 (female, age).  We discussed 2019 guideline update which says that we could stop her eliquis.  She is clear however that she would prefer to continue this for now.  [facility] Return to see EP NP every year  [name] MD, [facility] 10/20/2017 12:04 PM

## 2017-10-20 NOTE — Patient Instructions (Signed)
Please return in 12 months for your annual complete physical; please come fasting.  Good luck with your hip.   I will release your lab results to you on your MyChart account with further instructions. Please reply with any questions.    If you have any questions or concerns, please don't hesitate to send me a message via MyChart or call the office at [phone]. Thank you for visiting with [place] today! It's our pleasure caring for you.  I will release your lab results to you on your MyChart account with further instructions. Please reply with any questions.    Please do these things to maintain good health!   Exercise at least 30-45 minutes a day,  4-5 days a week.   Eat a low-fat diet with lots of fruits and vegetables, up to 7-9 servings per day.  Drink plenty of water daily. Try to drink 8 8oz glasses per day.  Seatbelts can save your life. Always wear your seatbelt.  Place Smoke Detectors on every level of your home and check batteries every year.  Schedule an appointment with an eye doctor for an eye exam every 1-2 years  Safe sex - use condoms to protect yourself from STDs if you could be exposed to these types of infections. Use birth control if you do not want to become pregnant and are sexually active.  Avoid heavy alcohol use. If you drink, keep it to less than 2 drinks/day and not every day.  Alex.  Choose someone you trust that could speak for you if you became unable to speak for yourself.  Depression is common in our stressful world.If you're feeling down or losing interest in things you normally enjoy, please come in for a visit.  If anyone is threatening or hurting you, please get help. Physical or Emotional Violence is never OK.

## 2017-10-23 LAB — CUP PACEART INCLINIC DEVICE CHECK
Battery Impedance: 3290 Ohm
Battery Remaining Longevity: 24 mo
Battery Voltage: 2.74 V
Brady Statistic RV Percent Paced: 0 %
Date Time Interrogation Session: 20190927150823
Implantable Lead Implant Date: 19960815
Implantable Lead Implant Date: 19960815
Implantable Lead Location: 753859
Implantable Lead Location: 753860
Implantable Lead Model: 5024
Implantable Pulse Generator Implant Date: 20090205
Lead Channel Impedance Value: 67 Ohm
Lead Channel Impedance Value: 792 Ohm
Lead Channel Pacing Threshold Amplitude: 0.75 V
Lead Channel Pacing Threshold Pulse Width: 0.4 ms
Lead Channel Sensing Intrinsic Amplitude: 8 mV
Lead Channel Setting Pacing Amplitude: 2 V
Lead Channel Setting Pacing Pulse Width: 0.4 ms
Lead Channel Setting Sensing Sensitivity: 2.8 mV

## 2017-11-13 ENCOUNTER — Ambulatory Visit (INDEPENDENT_AMBULATORY_CARE_PROVIDER_SITE_OTHER): Payer: BC Managed Care – PPO | Admitting: *Deleted

## 2017-11-13 ENCOUNTER — Telehealth: Payer: Self-pay

## 2017-11-13 DIAGNOSIS — I442 Atrioventricular block, complete: Secondary | ICD-10-CM

## 2017-11-13 NOTE — Telephone Encounter (Signed)
Spoke with pt and reminded pt of remote transmission that is due today. Pt verbalized understanding.   

## 2017-11-14 ENCOUNTER — Encounter: Payer: Self-pay | Admitting: Cardiology

## 2017-11-14 NOTE — Progress Notes (Signed)
Remote pacemaker transmission.   

## 2017-12-04 ENCOUNTER — Telehealth: Payer: Self-pay | Admitting: *Deleted

## 2017-12-04 NOTE — Telephone Encounter (Signed)
   [facility] Pre-operative Risk Assessment    Request for surgical clearance:  1. What type of surgery is being performed? LEFT TOTAL HIP   2. When is this surgery scheduled? 12/14/17  3. Are there any medications that need to be held prior to surgery and how long? ELIQUIS; PER DR. OLIN HOLD ELIQUIS X 3 DAYS   4. Practice name and name of physician performing surgery? [facility]; DR. Alvan Dame   5. What is your office phone and fax number? PH# [phone]; FAX # [phone]   2. Anesthesia type (None, local, MAC, general) ? SPINAL   Kristin [name] 12/04/2017, 12:39 PM  _________________________________________________________________   (provider comments below)

## 2017-12-04 NOTE — Telephone Encounter (Signed)
   I will route this recommendation to the requesting party via Epic fax function and remove from pre-op pool.  Please call with questions.  [place], [place] 12/04/2017, 3:30 PM

## 2017-12-04 NOTE — Telephone Encounter (Addendum)
Pt takes Eliquis for afib with CHADS2VASc score of 2 (age, sex). Renal function is normal. Ok to hold Eliquis for 3 days prior to THA per protocol.

## 2017-12-05 LAB — CUP PACEART REMOTE DEVICE CHECK
Battery Impedance: 3363 Ohm
Battery Remaining Longevity: 24 mo
Battery Voltage: 2.73 V
Brady Statistic RV Percent Paced: 0 %
Date Time Interrogation Session: 20191021214227
Implantable Lead Implant Date: 19960815
Implantable Lead Implant Date: 19960815
Implantable Lead Location: 753859
Implantable Lead Location: 753860
Implantable Lead Model: 5024
Implantable Pulse Generator Implant Date: 20090205
Lead Channel Impedance Value: 67 Ohm
Lead Channel Impedance Value: 784 Ohm
Lead Channel Pacing Threshold Amplitude: 0.875 V
Lead Channel Pacing Threshold Pulse Width: 0.4 ms
Lead Channel Sensing Intrinsic Amplitude: 5.6 mV
Lead Channel Setting Pacing Amplitude: 2 V
Lead Channel Setting Pacing Pulse Width: 0.4 ms
Lead Channel Setting Sensing Sensitivity: 2.8 mV

## 2017-12-28 ENCOUNTER — Other Ambulatory Visit: Payer: Self-pay | Admitting: Emergency Medicine

## 2017-12-28 ENCOUNTER — Other Ambulatory Visit: Payer: Self-pay | Admitting: Internal Medicine

## 2017-12-28 MED ORDER — LEVOTHYROXINE SODIUM 125 MCG PO TABS: 125.0000 ug | ORAL_TABLET | Freq: Every day | ORAL | 3 refills | Status: DC

## 2017-12-28 MED ORDER — CELECOXIB 100 MG PO CAPS: 100.0000 mg | ORAL_CAPSULE | Freq: Every day | ORAL | 5 refills | Status: DC

## 2017-12-28 NOTE — Telephone Encounter (Signed)
Pt last saw Dr Rayann Heman 10/20/17, last labs 10/20/17 Creat 0.83, age 67, weight 89.7kg, based on specified criteria pt is on appropriate dosage of Eliquis 5mg  BID.  Will refill rx.

## 2017-12-29 ENCOUNTER — Telehealth: Payer: Self-pay | Admitting: Family Medicine

## 2017-12-29 NOTE — Telephone Encounter (Signed)
Surgical clearance form in your basket for completion. Already printed the last ECG, labs and recent OV.

## 2017-12-29 NOTE — Telephone Encounter (Signed)
Completed forms

## 2017-12-29 NOTE — Telephone Encounter (Signed)
Pt dropped off surgical clearance forms. Placed in bin up front w/charge sheet.

## 2018-01-02 ENCOUNTER — Telehealth: Payer: Self-pay | Admitting: *Deleted

## 2018-01-02 NOTE — Telephone Encounter (Signed)
Patient with diagnosis of Afib on Eliquis for anticoagulation.    Procedure: left total hip Date of procedure: 02/13/18  CHADS2-VASc score of  2 (CHF, HTN, AGE, DM2, stroke/tia x 2, CAD, AGE, female)  CrCl  83ml/min  Per office protocol, patient can hold Eliquis for 3 days prior to procedure.

## 2018-01-02 NOTE — Telephone Encounter (Signed)
   [facility] Pre-operative Risk Assessment    Request for surgical clearance:  1. What type of surgery is being performed? LEFT TOTAL HIP   2. When is this surgery scheduled?         02/13/18  3. Are there any medications that need to be held prior to surgery and how long? ELIQUIS X 3 DAYS PRIOR   4. Practice name and name of physician performing surgery? [facility]; DR. Alvan Dame   5. What is your office phone and fax number? PH# [phone]; FAX# [phone]   6. Anesthesia type (None, local, MAC, general) ? SPINAL   Julaine Hua 01/02/2018, 8:39 AM  _________________________________________________________________   (provider comments below)

## 2018-01-05 NOTE — Telephone Encounter (Signed)
Is this for pharmacy and medical?  Please clarify

## 2018-01-08 NOTE — Telephone Encounter (Signed)
Called left a detailed message on surgery schedules voicemail re: verifying what type of clearance we have below, medical or pharmacy or both? Left a message for her to call back and ask for the preop pool.

## 2018-01-09 NOTE — Telephone Encounter (Signed)
Spoke with requesting office, she states they need both medical and pharmacy clearance .

## 2018-01-12 NOTE — Telephone Encounter (Signed)
Attempted to reach pt by phone for medical clearance. Left message for pt to call back.

## 2018-01-15 ENCOUNTER — Telehealth: Payer: Self-pay | Admitting: Internal Medicine

## 2018-01-15 NOTE — Telephone Encounter (Signed)
I have called the patient, the last time she was able to complete 4 METS of activity was April of this year prior to her hip issue. She does not have any prior history of CAD, nor does she have any exertional chest pain or shortness of breath with everyday activity. Will check with Dr. Rayann Heman to see if patient can be cleared without further workup as her overall cardiac risk factors is low (age only).   Dr. Rayann Heman, please forward your response to P CV DIV PREOP. Thank you.

## 2018-01-15 NOTE — Telephone Encounter (Signed)
New Message    Patient returning [place] phone call to speak about pre-op paperwork and was instructed to as for the provider in the pre-op clinic.   (Dot phrases not loaded)

## 2018-01-16 NOTE — Telephone Encounter (Signed)
Patient already has open encounter in another phone note. Will delete this one. Pending input from MD. Melina Copa PA-C

## 2018-01-20 NOTE — Telephone Encounter (Signed)
Ok to proceed with surgery if medically indicated without further CV testing.

## 2018-01-22 NOTE — Telephone Encounter (Signed)
   Primary Cardiologist: Dr Rayann Heman  Chart reviewed as part of pre-operative protocol coverage. Given past medical history and time since last visit, based on ACC/AHA guidelines, Kristin [name] would be at acceptable risk for the planned procedure without further cardiovascular testing.   OK to hold Eliquis 3 days pre op if needed.  I will route this recommendation to the requesting party via Epic fax function and remove from pre-op pool.  Please call with questions.  Kerin Ransom, PA-C 01/22/2018, 1:41 PM

## 2018-01-26 NOTE — H&P (Signed)
TOTAL HIP ADMISSION H&P  Patient is admitted for left total hip arthroplasty, anterior approach.  Subjective:  Chief Complaint:   Left hip primary OA / pain  HPI: Kristin [name], 68 y.o. female, has a history of pain and functional disability in the left hip(s) due to arthritis and patient has failed non-surgical conservative treatments for greater than 12 weeks to include NSAID's and/or analgesics, corticosteriod injections and activity modification.  Onset of symptoms was gradual starting <1 year ago with rapidlly worsening course since that time.The patient noted prior procedures of the hip to include arthroplasty on the right hip in 2008 per Dr. Alvan Dame.  Patient currently rates pain in the left hip at 8 out of 10 with activity. Patient has worsening of pain with activity and weight bearing, trendelenberg gait, pain that interfers with activities of daily living and pain with passive range of motion. Patient has evidence of periarticular osteophytes and joint space narrowing by imaging studies. This condition presents safety issues increasing the risk of falls.  There is no current active infection.  Risks, benefits and expectations were discussed with the patient.  Risks including but not limited to the risk of anesthesia, blood clots, nerve damage, blood vessel damage, failure of the prosthesis, infection and up to and including death.  Patient understand the risks, benefits and expectations and wishes to proceed with surgery.   PCP: Leamon Arnt, MD  D/C Plans:       Home   Post-op Meds:       No Rx given   Tranexamic Acid:      To be given - IV   Decadron:      Is to be given  FYI:      Eliquis (on pre-op)  Norco  Patient has a pacemaker  DME:   Rx given for - RW   PT:   No PT    Patient Active Problem List   Diagnosis Date Noted  . OSA (obstructive sleep apnea) 10/20/2017    Class: Chronic  . Osteopenia after menopause 06/14/2017  . Osteoarthritis of left hip 05/11/2017  .  Diverticulosis of large intestine without hemorrhage 01/04/2016  . Internal hemorrhoids without complication 26/94/8546  . Complete heart block ([place]) 10/06/2015  . Long term current use of anticoagulant 08/05/2015  . Pacemaker 10/14/2014  . Paroxysmal atrial fibrillation ([place]) 10/14/2014  . AR (allergic rhinitis) 01/08/2013  . GERD (gastroesophageal reflux disease) 01/08/2013  . Hypothyroidism, postsurgical 01/08/2013  . OA (osteoarthritis) 01/08/2013  . History of peptic ulcer disease - nsaid 01/08/2013   Past Medical History:  Diagnosis Date  . AR (allergic rhinitis)   . Atrial fibrillation ([place])   . GERD (gastroesophageal reflux disease)   . Hiatal hernia   . Hiatal hernia   . Migraines   . Osteoarthritis   . Osteoarthritis   . Osteopenia after menopause 06/14/2017   DEXA 08/2016 [facility]: T = -1.10 radius, -1.0 femur; lumbar spine elevated due to DJD; recheck 2-3 years.  . Pacemaker    [facility]B  . Paroxysmal A-fib ([place])   . PUD (peptic ulcer disease)   . Thyroid disease   . Transient complete heart block 1996    Past Surgical History:  Procedure Laterality Date  . HIP ARTHROPLASTY    . PACEMAKER GENERATOR CHANGE  03/01/2007   per[facility] Dr Ros[facility])  . PACEMAKER INSERTION  09/08/1994   performed in [facility] for transient complete heart block  . THYROIDECTOMY, PARTIAL  1986   BENIGN NODULES  . TUBAL LIGATION  1987    No current facility-administered medications for this encounter.    Current Outpatient Medications  Medication Sig Dispense Refill Last Dose  . amoxicillin (AMOXIL) 500 MG capsule    Taking  . Calcium Carb-Cholecalciferol (CALCIUM/VITAMIN D) 500-200 MG-UNIT TABS Take by mouth.   Taking  . celecoxib (CELEBREX) 100 MG capsule Take 1 capsule (100 mg total) by mouth daily. 30 capsule 5   . diltiazem (CARDIZEM) 30 MG tablet Take 1-2 tablets by mouth every 6 hours as needed for palpitations 30 tablet 3 Taking  . ELIQUIS 5 MG TABS  tablet TAKE 1 TABLET BY MOUTH TWICE DAILY. 180 tablet 1   . Fexofenadine HCl (ALLEGRA PO) Take by mouth.   Taking  . levothyroxine (SYNTHROID, LEVOTHROID) 125 MCG tablet Take 1 tablet (125 mcg total) by mouth daily. 90 tablet 3   . Multiple Vitamin (MULTI-VITAMINS) TABS Take by mouth. [name]   Taking  . omeprazole (PRILOSEC) 40 MG capsule Take 1 capsule (40 mg total) by mouth daily. 180 capsule 3 Taking  . OVER THE COUNTER MEDICATION Vitamins - Take as directed   Taking  . UNABLE TO FIND b 12 drops daily   Taking   Allergies  Allergen Reactions  . Morphine And Related Itching  . Neomycin Other (See Comments)    unknown  . Other Other (See Comments) and Rash    blisters  . Morphine Hives  . Adhesive [Tape] Rash    Social History   Tobacco Use  . Smoking status: Never Smoker  . Smokeless tobacco: Never Used  Substance Use Topics  . Alcohol use: Yes    Alcohol/week: 1.0 standard drinks    Types: 1 Glasses of wine per week    Comment: occasionally    Family History  Problem Relation Age of Onset  . Diabetes Mellitus II Maternal Uncle   . Cancer Maternal Grandmother        ENDOMETRIAL  . Arthritis Mother   . COPD Brother   . Drug abuse Brother   . Breast cancer Neg Hx      Review of Systems  Constitutional: Negative.   HENT: Negative.   Eyes: Negative.   Respiratory: Negative.   Cardiovascular: Negative.   Gastrointestinal: Positive for heartburn.  Genitourinary: Negative.   Musculoskeletal: Positive for joint pain.  Skin: Negative.   Neurological: Positive for headaches.  Endo/Heme/Allergies: Positive for environmental allergies.  Psychiatric/Behavioral: Negative.     Objective:  Physical Exam  Constitutional: She is oriented to person, place, and time. She appears well-developed.  HENT:  Head: Normocephalic.  Eyes: Pupils are equal, round, and reactive to light.  Neck: Neck supple. No JVD present. No tracheal deviation present. No  thyromegaly present.  Cardiovascular: Normal rate, regular rhythm and intact distal pulses.  Respiratory: Effort normal and breath sounds normal. No respiratory distress. She has no wheezes.  GI: Soft. There is no abdominal tenderness. There is no guarding.  Musculoskeletal:     Left hip: She exhibits decreased range of motion, decreased strength, tenderness and bony tenderness. She exhibits no swelling, no deformity and no laceration.  Lymphadenopathy:    She has no cervical adenopathy.  Neurological: She is alert and oriented to person, place, and time.  Skin: Skin is warm and dry.  Psychiatric: She has a normal mood and affect.      Labs:  Estimated body mass index is 30.08 kg/m as calculated from  the following:   Height as of 10/20/17: 5\' 8"  (1.727 m).   Weight as of 10/20/17: 89.7 kg.   Imaging Review Plain radiographs demonstrate severe degenerative joint disease of the left hip. The bone quality appears to be good for age and reported activity level.    Preoperative templating of the joint replacement has been completed, documented, and submitted to the Operating Room personnel in order to optimize intra-operative equipment management.     Assessment/Plan:  End stage arthritis, left hip  The patient history, physical examination, clinical judgement of the provider and imaging studies are consistent with end stage degenerative joint disease of the left hip and total hip arthroplasty is deemed medically necessary. The treatment options including medical management, injection therapy, arthroscopy and arthroplasty were discussed at length. The risks and benefits of total hip arthroplasty were presented and reviewed. The risks due to aseptic loosening, infection, stiffness, dislocation/subluxation,  thromboembolic complications and other imponderables were discussed.  The patient acknowledged the explanation, agreed to proceed with the plan and consent was signed. Patient is  being admitted for inpatient treatment for surgery, pain control, PT, OT, prophylactic antibiotics, VTE prophylaxis, progressive ambulation and ADL's and discharge planning.The patient is planning to be discharged home.    West Pugh Couper [place]   PA-C  01/26/2018, 8:39 AM

## 2018-02-08 NOTE — Progress Notes (Signed)
Cardiac clearance - see tele note 01-02-18 epic  PACEMAKER in place - managed by Dr [name], [place] 10-20-17 ---pacemaker orders on chart  Medical clearance Dr Billey Chang on chart  EKG 10-20-17 epic ECHO 2016 epic care everywhere  On Eliquis for Afib - surgery instructions in 01-02-18 epic tele note

## 2018-02-08 NOTE — Patient Instructions (Signed)
Kristin [name]  02/08/2018   Your procedure is scheduled on:  02-13-2018   Report to [facility] Main  Entrance     Report to admitting at 6:00AM    Call this number if you have problems the morning of surgery [phone]      Remember: Do not eat food or drink liquids :After Midnight. BRUSH YOUR TEETH MORNING OF SURGERY AND RINSE YOUR MOUTH OUT, NO CHEWING GUM CANDY OR MINTS.     Take these medicines the morning of surgery with A SIP OF WATER: diltiazem if needed, allegra, levothyroxine, prilosec                                You may not have any metal on your body including hair pins and              piercings  Do not wear jewelry, make-up, lotions, powders or perfumes, deodorant             Do not wear nail polish.  Do not shave  48 hours prior to surgery.              Do not bring valuables to the hospital. [place].  Contacts, dentures or bridgework may not be worn into surgery.  Leave suitcase in the car. After surgery it may be brought to your room.                 Please read over the following fact sheets you were given: _____________________________________________________________________             [facility] - Preparing for Surgery Before surgery, you can play an important role.  Because skin is not sterile, your skin needs to be as free of germs as possible.  You can reduce the number of germs on your skin by washing with CHG (chlorahexidine gluconate) soap before surgery.  CHG is an antiseptic cleaner which kills germs and bonds with the skin to continue killing germs even after washing. Please DO NOT use if you have an allergy to CHG or antibacterial soaps.  If your skin becomes reddened/irritated stop using the CHG and inform your nurse when you arrive at Short Stay. Do not shave (including legs and underarms) for at least 48 hours prior to the first CHG shower.  You may shave your  face/neck. Please follow these instructions carefully:  1.  Shower with CHG Soap the night before surgery and the  morning of Surgery.  2.  If you choose to wash your hair, wash your hair first as usual with your  normal  shampoo.  3.  After you shampoo, rinse your hair and body thoroughly to remove the  shampoo.                           4.  Use CHG as you would any other liquid soap.  You can apply chg directly  to the skin and wash                       Gently with a scrungie or clean washcloth.  5.  Apply the CHG Soap to your body ONLY FROM  THE NECK DOWN.   Do not use on face/ open                           Wound or open sores. Avoid contact with eyes, ears mouth and genitals (private parts).                       Wash face,  Genitals (private parts) with your normal soap.             6.  Wash thoroughly, paying special attention to the area where your surgery  will be performed.  7.  Thoroughly rinse your body with warm water from the neck down.  8.  DO NOT shower/wash with your normal soap after using and rinsing off  the CHG Soap.                9.  Pat yourself dry with a clean towel.            10.  Wear clean pajamas.            11.  Place clean sheets on your bed the night of your first shower and do not  sleep with pets. Day of Surgery : Do not apply any lotions/deodorants the morning of surgery.  Please wear clean clothes to the hospital/surgery center.  FAILURE TO FOLLOW THESE INSTRUCTIONS MAY RESULT IN THE CANCELLATION OF YOUR SURGERY PATIENT SIGNATURE_________________________________  NURSE SIGNATURE__________________________________  ________________________________________________________________________   Adam Phenix  An incentive spirometer is a tool that can help keep your lungs clear and active. This tool measures how well you are filling your lungs with each breath. Taking long deep breaths may help reverse or decrease the chance of developing breathing  (pulmonary) problems (especially infection) following:  A long period of time when you are unable to move or be active. BEFORE THE PROCEDURE   If the spirometer includes an indicator to show your best effort, your nurse or respiratory therapist will set it to a desired goal.  If possible, sit up straight or lean slightly forward. Try not to slouch.  Hold the incentive spirometer in an upright position. INSTRUCTIONS FOR USE  1. Sit on the edge of your bed if possible, or sit up as far as you can in bed or on a chair. 2. Hold the incentive spirometer in an upright position. 3. Breathe out normally. 4. Place the mouthpiece in your mouth and seal your lips tightly around it. 5. Breathe in slowly and as deeply as possible, raising the piston or the ball toward the top of the column. 6. Hold your breath for 3-5 seconds or for as long as possible. Allow the piston or ball to fall to the bottom of the column. 7. Remove the mouthpiece from your mouth and breathe out normally. 8. Rest for a few seconds and repeat Steps 1 through 7 at least 10 times every 1-2 hours when you are awake. Take your time and take a few normal breaths between deep breaths. 9. The spirometer may include an indicator to show your best effort. Use the indicator as a goal to work toward during each repetition. 10. After each set of 10 deep breaths, practice coughing to be sure your lungs are clear. If you have an incision (the cut made at the time of surgery), support your incision when coughing by placing a pillow or rolled up towels firmly against it. Once you are  able to get out of bed, walk around indoors and cough well. You may stop using the incentive spirometer when instructed by your caregiver.  RISKS AND COMPLICATIONS  Take your time so you do not get dizzy or light-headed.  If you are in pain, you may need to take or ask for pain medication before doing incentive spirometry. It is harder to take a deep breath if you  are having pain. AFTER USE  Rest and breathe slowly and easily.  It can be helpful to keep track of a log of your progress. Your caregiver can provide you with a simple table to help with this. If you are using the spirometer at home, follow these instructions: [place] IF:   You are having difficultly using the spirometer.  You have trouble using the spirometer as often as instructed.  Your pain medication is not giving enough relief while using the spirometer.  You develop fever of 100.5 F (38.1 C) or higher. SEEK IMMEDIATE MEDICAL CARE IF:   You cough up bloody sputum that had not been present before.  You develop fever of 102 F (38.9 C) or greater.  You develop worsening pain at or near the incision site. MAKE SURE YOU:   Understand these instructions.  Will watch your condition.  Will get help right away if you are not doing well or get worse. Document Released: 05/23/2006 Document Revised: 04/04/2011 Document Reviewed: 07/24/2006 ExitCare Patient Information 2014 ExitCare, [place].   ________________________________________________________________________  WHAT IS A BLOOD TRANSFUSION? Blood Transfusion Information  A transfusion is the replacement of blood or some of its parts. Blood is made up of multiple cells which provide different functions.  Red blood cells carry oxygen and are used for blood loss replacement.  White blood cells fight against infection.  Platelets control bleeding.  Plasma helps clot blood.  Other blood products are available for specialized needs, such as hemophilia or other clotting disorders. BEFORE THE TRANSFUSION  Who gives blood for transfusions?   Healthy volunteers who are fully evaluated to make sure their blood is safe. This is blood bank blood. Transfusion therapy is the safest it has ever been in the practice of medicine. Before blood is taken from a donor, a complete history is taken to make sure that person has  no history of diseases nor engages in risky social behavior (examples are intravenous drug use or sexual activity with multiple partners). The donor's travel history is screened to minimize risk of transmitting infections, such as malaria. The donated blood is tested for signs of infectious diseases, such as HIV and hepatitis. The blood is then tested to be sure it is compatible with you in order to minimize the chance of a transfusion reaction. If you or a relative donates blood, this is often done in anticipation of surgery and is not appropriate for emergency situations. It takes many days to process the donated blood. RISKS AND COMPLICATIONS Although transfusion therapy is very safe and saves many lives, the main dangers of transfusion include:   Getting an infectious disease.  Developing a transfusion reaction. This is an allergic reaction to something in the blood you were given. Every precaution is taken to prevent this. The decision to have a blood transfusion has been considered carefully by your caregiver before blood is given. Blood is not given unless the benefits outweigh the risks. AFTER THE TRANSFUSION  Right after receiving a blood transfusion, you will usually feel much better and more energetic. This is especially  true if your red blood cells have gotten low (anemic). The transfusion raises the level of the red blood cells which carry oxygen, and this usually causes an energy increase.  The nurse administering the transfusion will monitor you carefully for complications. HOME CARE INSTRUCTIONS  No special instructions are needed after a transfusion. You may find your energy is better. Speak with your caregiver about any limitations on activity for underlying diseases you may have. SEEK MEDICAL CARE IF:   Your condition is not improving after your transfusion.  You develop redness or irritation at the intravenous (IV) site. SEEK IMMEDIATE MEDICAL CARE IF:  Any of the following  symptoms occur over the next 12 hours:  Shaking chills.  You have a temperature by mouth above 102 F (38.9 C), not controlled by medicine.  Chest, back, or muscle pain.  People around you feel you are not acting correctly or are confused.  Shortness of breath or difficulty breathing.  Dizziness and fainting.  You get a rash or develop hives.  You have a decrease in urine output.  Your urine turns a dark color or changes to pink, red, or brown. Any of the following symptoms occur over the next 10 days:  You have a temperature by mouth above 102 F (38.9 C), not controlled by medicine.  Shortness of breath.  Weakness after normal activity.  The white part of the eye turns yellow (jaundice).  You have a decrease in the amount of urine or are urinating less often.  Your urine turns a dark color or changes to pink, red, or brown. Document Released: 01/08/2000 Document Revised: 04/04/2011 Document Reviewed: 08/27/2007 [facility] Patient Information 2014 [place], [place].  _______________________________________________________________________

## 2018-02-09 ENCOUNTER — Encounter (HOSPITAL_COMMUNITY)
Admission: RE | Admit: 2018-02-09 | Discharge: 2018-02-09 | Disposition: A | Discharge status: Home / Self Care | Payer: BC Managed Care – PPO | Source: Ambulatory Visit | Attending: Orthopedic Surgery | Admitting: Orthopedic Surgery

## 2018-02-09 ENCOUNTER — Other Ambulatory Visit: Payer: Self-pay

## 2018-02-09 ENCOUNTER — Encounter (HOSPITAL_COMMUNITY): Payer: Self-pay

## 2018-02-09 DIAGNOSIS — M1612 Unilateral primary osteoarthritis, left hip: Secondary | ICD-10-CM | POA: Insufficient documentation

## 2018-02-09 DIAGNOSIS — Z01812 Encounter for preprocedural laboratory examination: Secondary | ICD-10-CM | POA: Insufficient documentation

## 2018-02-09 HISTORY — DX: Sleep apnea, unspecified: G47.30

## 2018-02-09 LAB — CBC
HCT: 40.5 % (ref 36.0–46.0)
Hemoglobin: 12.9 g/dL (ref 12.0–15.0)
MCH: 31.1 pg (ref 26.0–34.0)
MCHC: 31.9 g/dL (ref 30.0–36.0)
MCV: 97.6 fL (ref 80.0–100.0)
Platelets: 213 10*3/uL (ref 150–400)
RBC: 4.15 MIL/uL (ref 3.87–5.11)
RDW: 12.6 % (ref 11.5–15.5)
WBC: 6.7 10*3/uL (ref 4.0–10.5)
nRBC: 0 % (ref 0.0–0.2)

## 2018-02-09 LAB — BASIC METABOLIC PANEL
Anion gap: 14 (ref 5–15)
BUN: 18 mg/dL (ref 8–23)
CO2: 23 mmol/L (ref 22–32)
Calcium: 9.7 mg/dL (ref 8.9–10.3)
Chloride: 99 mmol/L (ref 98–111)
Creatinine, Ser: 0.77 mg/dL (ref 0.44–1.00)
GFR calc Af Amer: >60 mL/min (ref >60)
GFR calc non Af Amer: >60 mL/min (ref >60)
Glucose, Bld: 88 mg/dL (ref 70–99)
Potassium: 4.6 mmol/L (ref 3.5–5.1)
Sodium: 136 mmol/L (ref 135–145)

## 2018-02-09 LAB — SURGICAL PCR SCREEN
MRSA, PCR: NEGATIVE
Staphylococcus aureus: NEGATIVE

## 2018-02-12 ENCOUNTER — Ambulatory Visit (INDEPENDENT_AMBULATORY_CARE_PROVIDER_SITE_OTHER): Payer: BC Managed Care – PPO

## 2018-02-12 DIAGNOSIS — I442 Atrioventricular block, complete: Secondary | ICD-10-CM | POA: Diagnosis not present

## 2018-02-12 NOTE — Progress Notes (Signed)
Anesthesia Chart Review    Case:  [id number] Date/Time:  02/13/18 0825   Procedure:  TOTAL HIP ARTHROPLASTY ANTERIOR APPROACH (Left ) - 70 mins   Anesthesia type:  Spinal   Pre-op diagnosis:  Left hip osteoarthritis   Location:  [place] [facility] / [facility] ORS   Surgeon:  Paralee Cancel, MD      DISCUSSION: 68 yo never smoker with h/o GERD, migraines, hiatal hernia, hypothryoid (s/p partial thyroidectomy 1986, euthyroid), mild OSA (w/o CPAP use), PUD, A-fib, pacemaker in place ([facility]) scheduled for above surgery on 02/13/18 with Dr. Paralee Cancel.   Pt last seen by cardiology on 10/20/17, seen by Dr. [name].  Pt with h/o remote transient complete heart block with pacemaker in place.  She also has a h/o A-fib and is anticoagulated with Eliquis. Per Dr. Jackalyn Lombard note per update guidelines pt could discontinue her Eliquis, but prefers to continue. Annual follow up advised. Cardiac clearance received on 01/23/28, per note by Kerin Ransom, PA-C, "Given past medical history and time since last visit, based on ACC/AHA guidelines, Kristin [name] would be at acceptable risk for the planned procedure without further cardiovascular testing.  OK to hold Eliquis 3 days pre op if needed."   Perioperative Prescription for ICD on chart. Last device check 11/13/17 with normal function. Procedure should not interfere with device function; no device programming or magnet placement needed.   Clearance from PCP, Dr. Billey Chang, received on 12/04/17 (on chart) which states pt is moderate risk due to PAF and anticoagulation.   Pt can proceed with planned procedure barring acute status change.  VS: BP 129/83   Pulse 88   Temp 37 C (Oral)   Resp 16   SpO2 99%   PROVIDERS: Leamon Arnt, MD is PCP last seen 10/20/17  [name], MD is Electrophysiologist   LABS: Labs reviewed: Acceptable for surgery. (all labs ordered are listed, but only abnormal results are displayed)  Labs Reviewed  SURGICAL PCR SCREEN  BASIC  METABOLIC PANEL  CBC  TYPE AND SCREEN     IMAGES:   EKG: 10/20/2017 Rate 78bpm Normal sinus rhythm  Normal ECG  CV: Echo 10/23/2014 SUMMARY The left ventricular size is normal.  Left ventricular systolic function is normal. LV ejection fraction = 60-65%. Left ventricular filling pattern is impaired. The right ventricle is mild to moderately dilated. The left ventricular wall motion is normal. The right ventricular systolic function is normal. Device lead in the right ventricle The left atrium is mildly dilated. The right atrium is mild to moderately dilated. There is mild tricuspid regurgitation. There is no significant valvular stenosis seen There is no pericardial effusion. There is no comparison study available.  Past Medical History:  Diagnosis Date  . AR (allergic rhinitis)   . Atrial fibrillation ([place])   . GERD (gastroesophageal reflux disease)   . Hiatal hernia   . Hiatal hernia   . Migraines   . Mild sleep apnea   . Osteoarthritis   . Osteoarthritis   . Osteopenia after menopause 06/14/2017   DEXA 08/2016 [facility]: T = -1.10 radius, -1.0 femur; lumbar spine elevated due to DJD; recheck 2-3 years.  . Pacemaker    [facility]  . Paroxysmal A-fib ([place])   . PUD (peptic ulcer disease)   . Thyroid disease   . Transient complete heart block 1996    Past Surgical History:  Procedure Laterality Date  . HIP ARTHROPLASTY Right 2008  . PACEMAKER GENERATOR CHANGE  03/01/2007  performed by Dr Rosita [facility] at [facility])  . PACEMAKER INSERTION  09/08/1994   performed in [facility] for transient complete heart block  . THYROIDECTOMY, PARTIAL  1986   BENIGN NODULES  . TUBAL LIGATION  1987    MEDICATIONS: . amoxicillin (AMOXIL) 500 MG capsule  . Calcium Carb-Cholecalciferol (CALCIUM/VITAMIN D) 600-400 MG-UNIT TABS  . celecoxib (CELEBREX) 100 MG capsule  . Cyanocobalamin (B-12) 1000 MCG SUBL  . diltiazem (CARDIZEM) 30 MG tablet  . ELIQUIS 5 MG TABS tablet   . fexofenadine (ALLEGRA) 180 MG tablet  . levothyroxine (SYNTHROID, LEVOTHROID) 125 MCG tablet  . Magnesium 250 MG TABS  . omeprazole (PRILOSEC) 40 MG capsule  . Potassium 99 MG TABS  . Probiotic Product (PROBIOTIC DAILY PO)  . sodium chloride (OCEAN) 0.65 % SOLN nasal spray  . vitamin C (ASCORBIC ACID) 500 MG tablet   No current facility-administered medications for this encounter.     Maia Plan [facility] Pre-Surgical Testing [phone] 02/12/18 10:23 AM

## 2018-02-12 NOTE — Anesthesia Preprocedure Evaluation (Addendum)
Anesthesia Evaluation  Patient identified by MRN, date of birth, ID band Patient awake    Reviewed: Allergy & Precautions, NPO status , Patient's Chart, lab work & pertinent test results  Airway Mallampati: II  TM Distance: >3 FB Neck ROM: Full    Dental no notable dental hx.    Pulmonary sleep apnea ,    Pulmonary exam normal breath sounds clear to auscultation       Cardiovascular Normal cardiovascular exam+ dysrhythmias Atrial Fibrillation + pacemaker  Rhythm:Regular Rate:Normal     Neuro/Psych  Headaches, negative psych ROS   GI/Hepatic Neg liver ROS, GERD  ,  Endo/Other  Hypothyroidism   Renal/GU negative Renal ROS  negative genitourinary   Musculoskeletal  (+) Arthritis , Osteoarthritis,    Abdominal   Peds negative pediatric ROS (+)  Hematology negative hematology ROS (+)   Anesthesia Other Findings   Reproductive/Obstetrics negative OB ROS                             Anesthesia Physical Anesthesia Plan  ASA: III  Anesthesia Plan: Spinal   Post-op Pain Management:    Induction: Intravenous  PONV Risk Score and Plan: 2 and Ondansetron and Midazolam  Airway Management Planned: Simple Face Mask  Additional Equipment:   Intra-op Plan:   Post-operative Plan:   Informed Consent: I have reviewed the patients History and Physical, chart, labs and discussed the procedure including the risks, benefits and alternatives for the proposed anesthesia with the patient or authorized representative who has indicated his/her understanding and acceptance.     Dental advisory given  Plan Discussed with: CRNA  Anesthesia Plan Comments: (See PST note 02/09/17, Konrad Felix, PA-C)       Anesthesia Quick Evaluation

## 2018-02-13 ENCOUNTER — Encounter (HOSPITAL_COMMUNITY): Payer: Self-pay | Admitting: Emergency Medicine

## 2018-02-13 ENCOUNTER — Inpatient Hospital Stay (HOSPITAL_COMMUNITY): Payer: BC Managed Care – PPO

## 2018-02-13 ENCOUNTER — Inpatient Hospital Stay (HOSPITAL_COMMUNITY)
Admission: RE | Admit: 2018-02-13 | Discharge: 2018-02-14 | DRG: 470 | Disposition: A | Discharge status: Home / Self Care | Payer: BC Managed Care – PPO | Attending: Orthopedic Surgery | Admitting: Orthopedic Surgery

## 2018-02-13 ENCOUNTER — Inpatient Hospital Stay (HOSPITAL_COMMUNITY): Payer: BC Managed Care – PPO | Admitting: Anesthesiology

## 2018-02-13 ENCOUNTER — Encounter (HOSPITAL_COMMUNITY)
Admission: RE | Disposition: A | Discharge status: Home / Self Care | Payer: Self-pay | Source: Home / Self Care | Attending: Orthopedic Surgery

## 2018-02-13 ENCOUNTER — Inpatient Hospital Stay (HOSPITAL_COMMUNITY): Payer: BC Managed Care – PPO | Admitting: Physician Assistant

## 2018-02-13 ENCOUNTER — Other Ambulatory Visit: Payer: Self-pay

## 2018-02-13 DIAGNOSIS — Z950 Presence of cardiac pacemaker: Secondary | ICD-10-CM | POA: Diagnosis not present

## 2018-02-13 DIAGNOSIS — Z885 Allergy status to narcotic agent status: Secondary | ICD-10-CM | POA: Diagnosis not present

## 2018-02-13 DIAGNOSIS — Z79899 Other long term (current) drug therapy: Secondary | ICD-10-CM | POA: Diagnosis not present

## 2018-02-13 DIAGNOSIS — E663 Overweight: Secondary | ICD-10-CM | POA: Diagnosis present

## 2018-02-13 DIAGNOSIS — Z6829 Body mass index (BMI) 29.0-29.9, adult: Secondary | ICD-10-CM

## 2018-02-13 DIAGNOSIS — Z7901 Long term (current) use of anticoagulants: Secondary | ICD-10-CM

## 2018-02-13 DIAGNOSIS — Z791 Long term (current) use of non-steroidal anti-inflammatories (NSAID): Secondary | ICD-10-CM | POA: Diagnosis not present

## 2018-02-13 DIAGNOSIS — Z419 Encounter for procedure for purposes other than remedying health state, unspecified: Secondary | ICD-10-CM

## 2018-02-13 DIAGNOSIS — Z96649 Presence of unspecified artificial hip joint: Secondary | ICD-10-CM

## 2018-02-13 DIAGNOSIS — M1612 Unilateral primary osteoarthritis, left hip: Principal | ICD-10-CM | POA: Diagnosis present

## 2018-02-13 DIAGNOSIS — K219 Gastro-esophageal reflux disease without esophagitis: Secondary | ICD-10-CM | POA: Diagnosis present

## 2018-02-13 DIAGNOSIS — G4733 Obstructive sleep apnea (adult) (pediatric): Secondary | ICD-10-CM | POA: Diagnosis present

## 2018-02-13 DIAGNOSIS — E890 Postprocedural hypothyroidism: Secondary | ICD-10-CM | POA: Diagnosis present

## 2018-02-13 DIAGNOSIS — M8580 Other specified disorders of bone density and structure, unspecified site: Secondary | ICD-10-CM | POA: Diagnosis present

## 2018-02-13 DIAGNOSIS — I480 Paroxysmal atrial fibrillation: Secondary | ICD-10-CM | POA: Diagnosis present

## 2018-02-13 DIAGNOSIS — I442 Atrioventricular block, complete: Secondary | ICD-10-CM | POA: Diagnosis present

## 2018-02-13 DIAGNOSIS — Z96642 Presence of left artificial hip joint: Secondary | ICD-10-CM

## 2018-02-13 HISTORY — PX: TOTAL HIP ARTHROPLASTY: SHX124

## 2018-02-13 LAB — TYPE AND SCREEN
ABO/RH(D): A POS
Antibody Screen: NEGATIVE

## 2018-02-13 LAB — CUP PACEART REMOTE DEVICE CHECK
Battery Impedance: 3567 Ohm
Battery Remaining Longevity: 23 mo
Battery Voltage: 2.72 V
Brady Statistic RV Percent Paced: 0 %
Date Time Interrogation Session: 20200121002714
Implantable Lead Implant Date: 19960815
Implantable Lead Implant Date: 19960815
Implantable Lead Location: 753859
Implantable Lead Location: 753860
Implantable Lead Model: 5024
Implantable Pulse Generator Implant Date: 20090205
Lead Channel Impedance Value: 67 Ohm
Lead Channel Impedance Value: 766 Ohm
Lead Channel Pacing Threshold Amplitude: 1.125 V
Lead Channel Pacing Threshold Pulse Width: 0.4 ms
Lead Channel Setting Pacing Amplitude: 2.25 V
Lead Channel Setting Pacing Pulse Width: 0.4 ms
Lead Channel Setting Sensing Sensitivity: 2.8 mV

## 2018-02-13 LAB — PROTIME-INR
INR: 0.87
Prothrombin Time: 11.7 seconds (ref 11.4–15.2)

## 2018-02-13 SURGERY — ARTHROPLASTY, HIP, TOTAL, ANTERIOR APPROACH
Anesthesia: Spinal | Site: Hip | Laterality: Left

## 2018-02-13 MED ORDER — LEVOTHYROXINE SODIUM 125 MCG PO TABS
125.0000 ug | ORAL_TABLET | Freq: Every day | ORAL | Status: DC
  Administered 2018-02-14: 125 ug via ORAL
  Filled 2018-02-13: qty 1

## 2018-02-13 MED ORDER — MENTHOL 3 MG MT LOZG: 1.0000 | LOZENGE | OROMUCOSAL | Status: DC | PRN

## 2018-02-13 MED ORDER — PROPOFOL 500 MG/50ML IV EMUL
INTRAVENOUS | Status: DC | PRN
  Administered 2018-02-13: 125 ug/kg/min via INTRAVENOUS

## 2018-02-13 MED ORDER — CELECOXIB 200 MG PO CAPS
200.0000 mg | ORAL_CAPSULE | Freq: Two times a day (BID) | ORAL | Status: DC
  Administered 2018-02-13 – 2018-02-14 (×2): 200 mg via ORAL
  Filled 2018-02-13 (×2): qty 1

## 2018-02-13 MED ORDER — DOCUSATE SODIUM 100 MG PO CAPS
100.0000 mg | ORAL_CAPSULE | Freq: Two times a day (BID) | ORAL | Status: DC
  Administered 2018-02-13 – 2018-02-14 (×2): 100 mg via ORAL
  Filled 2018-02-13 (×2): qty 1

## 2018-02-13 MED ORDER — LACTATED RINGERS IV SOLN
INTRAVENOUS | Status: DC
  Administered 2018-02-13 (×2): via INTRAVENOUS

## 2018-02-13 MED ORDER — ONDANSETRON HCL 4 MG PO TABS: 4.0000 mg | ORAL_TABLET | Freq: Four times a day (QID) | ORAL | Status: DC | PRN

## 2018-02-13 MED ORDER — METOCLOPRAMIDE HCL 5 MG/ML IJ SOLN: 5.0000 mg | Freq: Three times a day (TID) | INTRAMUSCULAR | Status: DC | PRN

## 2018-02-13 MED ORDER — OXYCODONE HCL 5 MG/5ML PO SOLN
5.0000 mg | Freq: Once | ORAL | Status: DC | PRN
  Filled 2018-02-13: qty 5

## 2018-02-13 MED ORDER — METOCLOPRAMIDE HCL 5 MG PO TABS: 5.0000 mg | ORAL_TABLET | Freq: Three times a day (TID) | ORAL | Status: DC | PRN

## 2018-02-13 MED ORDER — FERROUS SULFATE 325 (65 FE) MG PO TABS
325.0000 mg | ORAL_TABLET | Freq: Three times a day (TID) | ORAL | Status: DC
  Administered 2018-02-14 (×2): 325 mg via ORAL
  Filled 2018-02-13 (×2): qty 1

## 2018-02-13 MED ORDER — SODIUM CHLORIDE 0.9 % IR SOLN
Status: DC | PRN
  Administered 2018-02-13: 1000 mL

## 2018-02-13 MED ORDER — PHENYLEPHRINE 40 MCG/ML (10ML) SYRINGE FOR IV PUSH (FOR BLOOD PRESSURE SUPPORT)
PREFILLED_SYRINGE | INTRAVENOUS | Status: AC
  Filled 2018-02-13: qty 10

## 2018-02-13 MED ORDER — BUPIVACAINE IN DEXTROSE 0.75-8.25 % IT SOLN
INTRATHECAL | Status: DC | PRN
  Administered 2018-02-13: 1.8 mL via INTRATHECAL

## 2018-02-13 MED ORDER — HYDROMORPHONE HCL 1 MG/ML IJ SOLN
INTRAMUSCULAR | Status: AC
  Filled 2018-02-13: qty 1

## 2018-02-13 MED ORDER — POLYETHYLENE GLYCOL 3350 17 G PO PACK
17.0000 g | PACK | Freq: Two times a day (BID) | ORAL | Status: DC
  Administered 2018-02-13 – 2018-02-14 (×2): 17 g via ORAL
  Filled 2018-02-13 (×2): qty 1

## 2018-02-13 MED ORDER — SODIUM CHLORIDE 0.9 % IV SOLN
INTRAVENOUS | Status: DC | PRN
  Administered 2018-02-13: 50 ug/min via INTRAVENOUS

## 2018-02-13 MED ORDER — CEFAZOLIN SODIUM-DEXTROSE 2-4 GM/100ML-% IV SOLN
2.0000 g | INTRAVENOUS | Status: AC
  Administered 2018-02-13: 2 g via INTRAVENOUS
  Filled 2018-02-13: qty 100

## 2018-02-13 MED ORDER — PROMETHAZINE HCL 25 MG/ML IJ SOLN: 6.2500 mg | INTRAMUSCULAR | Status: DC | PRN

## 2018-02-13 MED ORDER — HYDROMORPHONE HCL 1 MG/ML IJ SOLN
0.2500 mg | INTRAMUSCULAR | Status: DC | PRN
  Administered 2018-02-13: 0.5 mg via INTRAVENOUS

## 2018-02-13 MED ORDER — ONDANSETRON HCL 4 MG/2ML IJ SOLN
INTRAMUSCULAR | Status: DC | PRN
  Administered 2018-02-13: 4 mg via INTRAVENOUS

## 2018-02-13 MED ORDER — METHOCARBAMOL 500 MG PO TABS
500.0000 mg | ORAL_TABLET | Freq: Four times a day (QID) | ORAL | Status: DC | PRN
  Administered 2018-02-14: 500 mg via ORAL
  Filled 2018-02-13: qty 1

## 2018-02-13 MED ORDER — CEFAZOLIN SODIUM-DEXTROSE 2-4 GM/100ML-% IV SOLN
2.0000 g | Freq: Four times a day (QID) | INTRAVENOUS | Status: AC
  Administered 2018-02-13 (×2): 2 g via INTRAVENOUS
  Filled 2018-02-13 (×2): qty 100

## 2018-02-13 MED ORDER — BISACODYL 10 MG RE SUPP: 10.0000 mg | Freq: Every day | RECTAL | Status: DC | PRN

## 2018-02-13 MED ORDER — ONDANSETRON HCL 4 MG/2ML IJ SOLN
INTRAMUSCULAR | Status: AC
  Filled 2018-02-13: qty 2

## 2018-02-13 MED ORDER — DEXAMETHASONE SODIUM PHOSPHATE 10 MG/ML IJ SOLN
10.0000 mg | Freq: Once | INTRAMUSCULAR | Status: AC
  Administered 2018-02-13: 10 mg via INTRAVENOUS

## 2018-02-13 MED ORDER — DIPHENHYDRAMINE HCL 12.5 MG/5ML PO ELIX
12.5000 mg | ORAL_SOLUTION | ORAL | Status: DC | PRN
  Administered 2018-02-14: 25 mg via ORAL
  Filled 2018-02-13: qty 10

## 2018-02-13 MED ORDER — METHOCARBAMOL 500 MG IVPB - SIMPLE MED
500.0000 mg | Freq: Four times a day (QID) | INTRAVENOUS | Status: DC | PRN
  Administered 2018-02-13: 500 mg via INTRAVENOUS
  Filled 2018-02-13: qty 50

## 2018-02-13 MED ORDER — TRANEXAMIC ACID-NACL 1000-0.7 MG/100ML-% IV SOLN
1000.0000 mg | INTRAVENOUS | Status: AC
  Administered 2018-02-13: 1000 mg via INTRAVENOUS
  Filled 2018-02-13: qty 100

## 2018-02-13 MED ORDER — OXYCODONE HCL 5 MG PO TABS: 5.0000 mg | ORAL_TABLET | Freq: Once | ORAL | Status: DC | PRN

## 2018-02-13 MED ORDER — DOCUSATE SODIUM 100 MG PO CAPS: 100.0000 mg | ORAL_CAPSULE | Freq: Two times a day (BID) | ORAL | 0 refills | Status: DC

## 2018-02-13 MED ORDER — LORATADINE 10 MG PO TABS
10.0000 mg | ORAL_TABLET | Freq: Every day | ORAL | Status: DC
  Administered 2018-02-13 – 2018-02-14 (×2): 10 mg via ORAL
  Filled 2018-02-13 (×2): qty 1

## 2018-02-13 MED ORDER — METHOCARBAMOL 500 MG PO TABS: 500.0000 mg | ORAL_TABLET | Freq: Four times a day (QID) | ORAL | 0 refills | Status: DC | PRN

## 2018-02-13 MED ORDER — DEXAMETHASONE SODIUM PHOSPHATE 10 MG/ML IJ SOLN
10.0000 mg | Freq: Once | INTRAMUSCULAR | Status: AC
  Administered 2018-02-14: 10 mg via INTRAVENOUS
  Filled 2018-02-13: qty 1

## 2018-02-13 MED ORDER — APIXABAN 2.5 MG PO TABS
2.5000 mg | ORAL_TABLET | Freq: Two times a day (BID) | ORAL | Status: DC
  Administered 2018-02-14: 2.5 mg via ORAL
  Filled 2018-02-13: qty 1

## 2018-02-13 MED ORDER — CHLORHEXIDINE GLUCONATE 4 % EX LIQD: 60.0000 mL | Freq: Once | CUTANEOUS | Status: DC

## 2018-02-13 MED ORDER — TRANEXAMIC ACID-NACL 1000-0.7 MG/100ML-% IV SOLN
1000.0000 mg | Freq: Once | INTRAVENOUS | Status: AC
  Administered 2018-02-13: 1000 mg via INTRAVENOUS
  Filled 2018-02-13: qty 100

## 2018-02-13 MED ORDER — HYDROCODONE-ACETAMINOPHEN 7.5-325 MG PO TABS
1.0000 | ORAL_TABLET | ORAL | Status: DC | PRN
  Administered 2018-02-13: 1 via ORAL
  Administered 2018-02-13: 2 via ORAL
  Filled 2018-02-13 (×3): qty 1

## 2018-02-13 MED ORDER — PROPOFOL 10 MG/ML IV BOLUS
INTRAVENOUS | Status: DC | PRN
  Administered 2018-02-13: 40 mg via INTRAVENOUS

## 2018-02-13 MED ORDER — ONDANSETRON HCL 4 MG/2ML IJ SOLN: 4.0000 mg | Freq: Four times a day (QID) | INTRAMUSCULAR | Status: DC | PRN

## 2018-02-13 MED ORDER — OMEPRAZOLE 20 MG PO CPDR
40.0000 mg | DELAYED_RELEASE_CAPSULE | Freq: Every day | ORAL | Status: DC
  Administered 2018-02-14: 40 mg via ORAL
  Filled 2018-02-13: qty 2

## 2018-02-13 MED ORDER — METHOCARBAMOL 500 MG IVPB - SIMPLE MED
INTRAVENOUS | Status: AC
  Filled 2018-02-13: qty 50

## 2018-02-13 MED ORDER — POLYETHYLENE GLYCOL 3350 17 G PO PACK: 17.0000 g | PACK | Freq: Two times a day (BID) | ORAL | 0 refills | Status: DC

## 2018-02-13 MED ORDER — ALUM & MAG HYDROXIDE-SIMETH 200-200-20 MG/5ML PO SUSP: 15.0000 mL | ORAL | Status: DC | PRN

## 2018-02-13 MED ORDER — FENTANYL CITRATE (PF) 100 MCG/2ML IJ SOLN
25.0000 ug | INTRAMUSCULAR | Status: DC | PRN
  Administered 2018-02-13: 25 ug via INTRAVENOUS
  Filled 2018-02-13: qty 2

## 2018-02-13 MED ORDER — MAGNESIUM CITRATE PO SOLN: 1.0000 | Freq: Once | ORAL | Status: DC | PRN

## 2018-02-13 MED ORDER — HYDROCODONE-ACETAMINOPHEN 5-325 MG PO TABS
1.0000 | ORAL_TABLET | ORAL | Status: DC | PRN
  Administered 2018-02-13: 2 via ORAL
  Administered 2018-02-14 (×3): 1 via ORAL
  Filled 2018-02-13 (×2): qty 1
  Filled 2018-02-13: qty 2
  Filled 2018-02-13 (×2): qty 1

## 2018-02-13 MED ORDER — DILTIAZEM HCL 30 MG PO TABS
30.0000 mg | ORAL_TABLET | Freq: Four times a day (QID) | ORAL | Status: DC | PRN
  Filled 2018-02-13: qty 2

## 2018-02-13 MED ORDER — HYDROCODONE-ACETAMINOPHEN 7.5-325 MG PO TABS: 1.0000 | ORAL_TABLET | ORAL | 0 refills | Status: DC | PRN

## 2018-02-13 MED ORDER — 0.9 % SODIUM CHLORIDE (POUR BTL) OPTIME
TOPICAL | Status: DC | PRN
  Administered 2018-02-13: 1000 mL

## 2018-02-13 MED ORDER — PHENOL 1.4 % MT LIQD
1.0000 | OROMUCOSAL | Status: DC | PRN
  Filled 2018-02-13: qty 177

## 2018-02-13 MED ORDER — PROPOFOL 10 MG/ML IV BOLUS
INTRAVENOUS | Status: AC
  Filled 2018-02-13: qty 60

## 2018-02-13 MED ORDER — ACETAMINOPHEN 325 MG PO TABS: 325.0000 mg | ORAL_TABLET | Freq: Four times a day (QID) | ORAL | Status: DC | PRN

## 2018-02-13 MED ORDER — PROPOFOL 10 MG/ML IV BOLUS
INTRAVENOUS | Status: AC
  Filled 2018-02-13: qty 20

## 2018-02-13 MED ORDER — SODIUM CHLORIDE 0.9 % IV SOLN
INTRAVENOUS | Status: DC
  Administered 2018-02-13 – 2018-02-14 (×3): via INTRAVENOUS

## 2018-02-13 MED ORDER — PHENYLEPHRINE 40 MCG/ML (10ML) SYRINGE FOR IV PUSH (FOR BLOOD PRESSURE SUPPORT)
PREFILLED_SYRINGE | INTRAVENOUS | Status: DC | PRN
  Administered 2018-02-13 (×3): 80 ug via INTRAVENOUS

## 2018-02-13 MED ORDER — FERROUS SULFATE 325 (65 FE) MG PO TABS: 325.0000 mg | ORAL_TABLET | Freq: Three times a day (TID) | ORAL | 3 refills | Status: DC

## 2018-02-13 MED ORDER — DEXAMETHASONE SODIUM PHOSPHATE 10 MG/ML IJ SOLN
INTRAMUSCULAR | Status: AC
  Filled 2018-02-13: qty 1

## 2018-02-13 SURGICAL SUPPLY — 43 items
BAG DECANTER FOR FLEXI CONT (MISCELLANEOUS) IMPLANT
BAG ZIPLOCK 12X15 (MISCELLANEOUS) IMPLANT
BLADE SAG 18X100X1.27 (BLADE) ×2 IMPLANT
BLADE SURG SZ10 CARB STEEL (BLADE) ×4 IMPLANT
COVER PERINEAL POST (MISCELLANEOUS) ×2 IMPLANT
COVER SURGICAL LIGHT HANDLE (MISCELLANEOUS) ×2 IMPLANT
COVER WAND RF STERILE (DRAPES) IMPLANT
CUP ACETBLR 52 OD PINNACLE (Hips) ×2 IMPLANT
DERMABOND ADVANCED (GAUZE/BANDAGES/DRESSINGS) ×1
DERMABOND ADVANCED .7 DNX12 (GAUZE/BANDAGES/DRESSINGS) ×1 IMPLANT
DRAPE STERI IOBAN 125X83 (DRAPES) ×2 IMPLANT
DRAPE U-SHAPE 47X51 STRL (DRAPES) ×4 IMPLANT
DRESSING AQUACEL AG SP 3.5X10 (GAUZE/BANDAGES/DRESSINGS) ×1 IMPLANT
DRSG AQUACEL AG SP 3.5X10 (GAUZE/BANDAGES/DRESSINGS) ×2
DURAPREP 26ML APPLICATOR (WOUND CARE) ×2 IMPLANT
ELECT REM PT RETURN 15FT ADLT (MISCELLANEOUS) ×2 IMPLANT
ELIMINATOR HOLE APEX DEPUY (Hips) ×2 IMPLANT
GLOVE BIOGEL M STRL SZ7.5 (GLOVE) IMPLANT
GLOVE BIOGEL PI IND STRL 7.5 (GLOVE) ×1 IMPLANT
GLOVE BIOGEL PI IND STRL 8.5 (GLOVE) ×1 IMPLANT
GLOVE BIOGEL PI INDICATOR 7.5 (GLOVE) ×1
GLOVE BIOGEL PI INDICATOR 8.5 (GLOVE) ×1
GLOVE ECLIPSE 8.0 STRL XLNG CF (GLOVE) ×4 IMPLANT
GLOVE ORTHO TXT STRL SZ7.5 (GLOVE) ×2 IMPLANT
GOWN STRL REUS W/TWL 2XL LVL3 (GOWN DISPOSABLE) ×2 IMPLANT
GOWN STRL REUS W/TWL LRG LVL3 (GOWN DISPOSABLE) ×2 IMPLANT
HEAD CERAMIC 36 PLUS 8.5 12 14 (Hips) ×2 IMPLANT
HOLDER FOLEY CATH W/STRAP (MISCELLANEOUS) ×2 IMPLANT
LINER NEUTRAL 52X36MM PLUS 4 (Liner) ×2 IMPLANT
PACK ANTERIOR HIP CUSTOM (KITS) ×2 IMPLANT
SCREW 6.5MMX30MM (Screw) ×2 IMPLANT
STEM TRI LOC BPS GRIPTON SZ 5 (Hips) ×1 IMPLANT
SUT MNCRL AB 4-0 PS2 18 (SUTURE) ×2 IMPLANT
SUT STRATAFIX 0 PDS 27 VIOLET (SUTURE) ×2
SUT VIC AB 1 CT1 36 (SUTURE) ×6 IMPLANT
SUT VIC AB 2-0 CT1 27 (SUTURE) ×2
SUT VIC AB 2-0 CT1 TAPERPNT 27 (SUTURE) ×2 IMPLANT
SUTURE STRATFX 0 PDS 27 VIOLET (SUTURE) ×1 IMPLANT
TRAY FOLEY MTR SLVR 14FR STAT (SET/KITS/TRAYS/PACK) ×2 IMPLANT
TRAY FOLEY MTR SLVR 16FR STAT (SET/KITS/TRAYS/PACK) IMPLANT
TRI LOC BPS W GRIPTON SZ 5 (Hips) ×2 IMPLANT
WATER STERILE IRR 1000ML POUR (IV SOLUTION) ×2 IMPLANT
YANKAUER SUCT BULB TIP 10FT TU (MISCELLANEOUS) ×2 IMPLANT

## 2018-02-13 NOTE — Progress Notes (Signed)
Dr. Sabra Heck notified that pt took diltiazem last night due to palpatations.  Pt denies palpatations today. Denies cp/sob.

## 2018-02-13 NOTE — Interval H&P Note (Signed)
History and Physical Interval Note:  02/13/2018 7:07 AM  Kristin [name]  has presented today for surgery, with the diagnosis of Left hip osteoarthritis  The various methods of treatment have been discussed with the patient and family. After consideration of risks, benefits and other options for treatment, the patient has consented to  Procedure(s) with comments: TOTAL HIP ARTHROPLASTY ANTERIOR APPROACH (Left) - 70 mins as a surgical intervention .  The patient's history has been reviewed, patient examined, no change in status, stable for surgery.  I have reviewed the patient's chart and labs.  Questions were answered to the patient's satisfaction.     Mauri Pole

## 2018-02-13 NOTE — Discharge Instructions (Signed)

## 2018-02-13 NOTE — Anesthesia Postprocedure Evaluation (Signed)
Anesthesia Post Note  Patient: Kristin [name]  Procedure(s) Performed: TOTAL HIP ARTHROPLASTY ANTERIOR APPROACH (Left Hip)     Patient location during evaluation: PACU Anesthesia Type: Spinal Level of consciousness: oriented and awake and alert Pain management: pain level controlled Vital Signs Assessment: post-procedure vital signs reviewed and stable Respiratory status: spontaneous breathing and respiratory function stable Cardiovascular status: blood pressure returned to baseline and stable Postop Assessment: no headache, no backache and no apparent nausea or vomiting Anesthetic complications: no    Last Vitals:  Vitals:   02/13/18 1115 02/13/18 1130  BP: 97/73 100/63  Pulse: 69 71  Resp: 14 14  Temp:    SpO2: 100% 100%    Last Pain:  Vitals:   02/13/18 1115  TempSrc:   PainSc: 0-No pain                 Lynda Rainwater

## 2018-02-13 NOTE — Anesthesia Procedure Notes (Signed)
Spinal  Patient location during procedure: OR Start time: 02/13/2018 8:38 AM Staffing Resident/CRNA: [place] Amrom Ore C, CRNA Performed: resident/CRNA  Preanesthetic Checklist Completed: patient identified, site marked, surgical consent, pre-op evaluation, timeout performed, IV checked, risks and benefits discussed and monitors and equipment checked Spinal Block Patient position: sitting Prep: site prepped and draped and DuraPrep Patient monitoring: heart rate, continuous pulse ox and blood pressure Approach: midline Location: L3-4 Injection technique: single-shot Needle Needle type: Pencan  Needle gauge: 24 G Needle length: 9 cm Assessment Sensory level: T6 Additional Notes Expiration date of kit checked and confirmed. Patient tolerated procedure well, without complications.

## 2018-02-13 NOTE — Transfer of Care (Signed)
Immediate Anesthesia Transfer of Care Note  Patient: Kristin [name]  Procedure(s) Performed: TOTAL HIP ARTHROPLASTY ANTERIOR APPROACH (Left Hip)  Patient Location: PACU  Anesthesia Type:MAC and Spinal  Level of Consciousness: awake, alert , oriented and patient cooperative  Airway & Oxygen Therapy: Patient Spontanous Breathing and Patient connected to face mask oxygen  Post-op Assessment: Report given to RN and Post -op Vital signs reviewed and stable  Post vital signs: Reviewed and stable  Last Vitals:  Vitals Value Taken Time  BP 81/55 02/13/2018 10:17 AM  Temp    Pulse 80 02/13/2018 10:19 AM  Resp 12 02/13/2018 10:19 AM  SpO2 96 % 02/13/2018 10:19 AM  Vitals shown include unvalidated device data.  Last Pain:  Vitals:   02/13/18 0654  TempSrc:   PainSc: 0-No pain      Patients Stated Pain Goal: 4 (32/44/01 0272)  Complications: No apparent anesthesia complications

## 2018-02-13 NOTE — Anesthesia Procedure Notes (Signed)
Procedure Name: MAC Date/Time: 02/13/2018 8:36 AM Performed by: West Pugh, CRNA Pre-anesthesia Checklist: Patient identified, Emergency Drugs available, Suction available, Patient being monitored and Timeout performed Patient Re-evaluated:Patient Re-evaluated prior to induction Oxygen Delivery Method: Simple face mask Preoxygenation: Pre-oxygenation with 100% oxygen Induction Type: IV induction Placement Confirmation: positive ETCO2 Dental Injury: Teeth and Oropharynx as per pre-operative assessment

## 2018-02-13 NOTE — Op Note (Signed)
NAME:  Kristin [name]                ACCOUNT NO.: [id number]      MEDICAL RECORD NO.: [id number]      FACILITY:  [facility]      PHYSICIAN:  Mauri Pole  DATE OF BIRTH:  1950/10/21     DATE OF PROCEDURE:  02/13/2018                                 OPERATIVE REPORT         PREOPERATIVE DIAGNOSIS: Left  hip osteoarthritis.      POSTOPERATIVE DIAGNOSIS:  Left hip osteoarthritis.      PROCEDURE:  Left total hip replacement through an anterior approach   utilizing DePuy THR system, component size 45mm pinnacle cup, a size 36+1.5 neutral   Altrex liner, a size 5 Hi Tri Lock stem with a 36+8.5 delta ceramic   ball.      SURGEON:  Pietro Cassis. Alvan Dame, M.D.      ASSISTANT:  Danae Orleans, PA-C     ANESTHESIA:  Spinal.      SPECIMENS:  None.      COMPLICATIONS:  None.      BLOOD LOSS:  350 cc     DRAINS:  None.      INDICATION OF THE PROCEDURE:  Kristin [name] is a 68 y.o. female who had   presented to office for evaluation of left hip pain.  Radiographs revealed   progressive degenerative changes with bone-on-bone   articulation of the  hip joint, including subchondral cystic changes and osteophytes.  The patient had painful limited range of   motion significantly affecting their overall quality of life and function.  The patient was failing to    respond to conservative measures including medications and/or injections and activity modification and at this point was ready   to proceed with more definitive measures.  Consent was obtained for   benefit of pain relief.  Specific risks of infection, DVT, component   failure, dislocation, neurovascular injury, and need for revision surgery were reviewed in the office as well discussion of   the anterior versus posterior approach were reviewed.     PROCEDURE IN DETAIL:  The patient was brought to operative theater.   Once adequate anesthesia, preoperative antibiotics, 2 gm of Ancef, 1 gm of Tranexamic Acid, and 10  mg of Decadron were administered, the patient was positioned supine on the [employer] table.  Once the patient was safely positioned with adequate padding of boney prominences we predraped out the hip, and used fluoroscopy to confirm orientation of the pelvis.      The left hip was then prepped and draped from proximal iliac crest to   mid thigh with a shower curtain technique.      Time-out was performed identifying the patient, planned procedure, and the appropriate extremity.     An incision was then made 2 cm lateral to the   anterior superior iliac spine extending over the orientation of the   tensor fascia lata muscle and sharp dissection was carried down to the   fascia of the muscle.      The fascia was then incised.  The muscle belly was identified and swept   laterally and retractor placed along the superior neck.  Following   cauterization of the circumflex vessels and removing some  pericapsular   fat, a second cobra retractor was placed on the inferior neck.  A T-capsulotomy was made along the line of the   superior neck to the trochanteric fossa, then extended proximally and   distally.  Tag sutures were placed and the retractors were then placed   intracapsular.  We then identified the trochanteric fossa and   orientation of my neck cut and then made a neck osteotomy with the femur on traction.  The femoral   head was removed without difficulty or complication.  Traction was let   off and retractors were placed posterior and anterior around the   acetabulum.      The labrum and foveal tissue were debrided.  I began reaming with a 44 mm   reamer and reamed up to 51 mm reamer with good bony bed preparation and a 52 mm  cup was chosen.  The final 52 mm Pinnacle cup was then impacted under fluoroscopy to confirm the depth of penetration and orientation with respect to   Abduction and forward flexion.  A screw was placed into the ilium followed by the hole eliminator.  The final    36+4 neutral Altrex liner was impacted with good visualized rim fit.  The cup was positioned anatomically within the acetabular portion of the pelvis.      At this point, the femur was rolled to 100 degrees.  Further capsule was   released off the inferior aspect of the femoral neck.  I then   released the superior capsule proximally.  With the leg in a neutral position the hook was placed laterally   along the femur under the vastus lateralis origin and elevated manually and then held in position using the hook attachment on the bed.  The leg was then extended and adducted with the leg rolled to 100   degrees of external rotation.  Retractors were placed along the medial calcar and posteriorly over the greater trochanter.  Once the proximal femur was fully   exposed, I used a box osteotome to set orientation.  I then began   broaching with the starting chili pepper broach and passed this by hand and then broached up to 5.  With the 5 broach in place I chose a high offset neck and did several trial reductions.  The offset was appropriate, leg lengths   appeared to be equal best matched with the +8.5 head ball trial confirmed radiographically.   Given these findings, I went ahead and dislocated the hip, repositioned all   retractors and positioned the right hip in the extended and abducted position.  The final 5 Hi Tri Lock stem was   chosen and it was impacted down to the level of neck cut.  Based on this   and the trial reductions, a final 36+8.5 delta ceramic ball was chosen and   impacted onto a clean and dry trunnion, and the hip was reduced.  The   hip had been irrigated throughout the case again at this point.  I did   reapproximate the superior capsular leaflet to the anterior leaflet   using #1 Vicryl.  The fascia of the   tensor fascia lata muscle was then reapproximated using #1 Vicryl and #0 Stratafix sutures.  The   remaining wound was closed with 2-0 Vicryl and running 4-0 Monocryl.    The hip was cleaned, dried, and dressed sterilely using Dermabond and   Aquacel dressing.  The patient was then brought  to recovery room in stable condition tolerating the procedure well.    Danae Orleans, PA-C was present for the entirety of the case involved from   preoperative positioning, perioperative retractor management, general   facilitation of the case, as well as primary wound closure as assistant.            Pietro Cassis Alvan Dame, M.D.        02/13/2018 9:58 AM

## 2018-02-13 NOTE — Progress Notes (Signed)
Remote pacemaker transmission.   

## 2018-02-13 NOTE — Evaluation (Signed)
Physical Therapy Evaluation Patient Details Name: Kristin [name] MRN: [id number] DOB: [birth year] Today's Date: 02/13/2018   History of Present Illness  68 yo female s/p L DA-THA on 02/13/18. 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   Clinical Impression   Pt presents with L hip pain, difficulty performing mobility tasks, suspected post-operative L hip weakness, and decreased activity tolerance due to L hip pain. Pt to benefit from acute PT to address deficits. Pt ambulated 15 ft with RW with increased time and min guard assist for safety, verbal cuing for technique provided throughout. Pt educated on ankle pumps (20/hour) to perform this afternoon/evening to increase circulation, to pt's tolerance and limited by pain. PT to progress mobility as tolerated, and will continue to follow acutely.        Follow Up Recommendations Follow surgeon's recommendation for DC plan and follow-up therapies;Supervision for mobility/OOB    Equipment Recommendations  None recommended by PT    Recommendations for Other Services       Precautions / Restrictions Restrictions Weight Bearing Restrictions: No      Mobility  Bed Mobility Overal bed mobility: Needs Assistance Bed Mobility: Supine to Sit     Supine to sit: Min assist;HOB elevated     General bed mobility comments: Min assist for LLE lifting and translation to EOB, increased time and effort.   Transfers Overall transfer level: Needs assistance Equipment used: Rolling walker (2 wheeled) Transfers: Sit to/from Stand Sit to Stand: Min assist;From elevated surface         General transfer comment: Min assist for power up and steadying upon standing, increased time to come to full standing.   Ambulation/Gait Ambulation/Gait assistance: Min guard Gait Distance (Feet): 15 Feet Assistive device: Rolling walker (2 wheeled) Gait  Pattern/deviations: Step-to pattern;Decreased weight shift to left;Decreased stance time - left;Antalgic;Trunk flexed Gait velocity: decreased    General Gait Details: Min guard for safety. Verbal cuing for placement in RW, sequencing, turning.   Stairs            Wheelchair Mobility    Modified Rankin (Stroke Patients Only)       Balance Overall balance assessment: Mild deficits observed, not formally tested                                           Pertinent Vitals/Pain Pain Assessment: 0-10 Pain Score: 4  Pain Location: L hip, with ambulation  Pain Descriptors / Indicators: Sore Pain Intervention(s): Limited activity within patient's tolerance;Repositioned;Ice applied;Monitored during session;Premedicated before session    Home Living Family/patient expects to be discharged to:: Private residence Living Arrangements: Spouse/significant other Available Help at Discharge: Family Type of Home: House Home Access: Stairs to enter Entrance Stairs-Rails: None Entrance Stairs-Number of Steps: 3+3 to get into house with no rails  Home Layout: Multi-level Home Equipment: Bedside commode;Walker - 2 wheels;Cane - single point      Prior Function Level of Independence: Independent         Comments: Pt states "I should have been using a cane"      Hand Dominance   Dominant Hand: Right    Extremity/Trunk Assessment   Upper Extremity Assessment Upper Extremity Assessment: Overall WFL for tasks assessed    Lower Extremity Assessment Lower Extremity Assessment: Overall WFL for tasks assessed;LLE deficits/detail LLE  Deficits / Details: suspected post-surgical hip weakness; able to perform ankle pumps, heel slides, quad set LLE Sensation: WNL    Cervical / Trunk Assessment Cervical / Trunk Assessment: Normal  Communication   Communication: No difficulties  Cognition Arousal/Alertness: Awake/alert Behavior During Therapy: WFL for tasks  assessed/performed Overall Cognitive Status: Within Functional Limits for tasks assessed                                        General Comments      Exercises     Assessment/Plan    PT Assessment Patient needs continued PT services  PT Problem List Decreased strength;Pain;Decreased range of motion;Decreased activity tolerance;Decreased knowledge of use of DME;Decreased balance;Decreased mobility       PT Treatment Interventions DME instruction;Therapeutic activities;Gait training;Patient/family education;Therapeutic exercise;Stair training;Balance training;Functional mobility training    PT Goals (Current goals can be found in the Care Plan section)  Acute Rehab PT Goals Patient Stated Goal: none stated  PT Goal Formulation: With patient Time For Goal Achievement: 02/20/18 Potential to Achieve Goals: Good    Frequency 7X/week   Barriers to discharge        Co-evaluation               AM-PAC PT "6 Clicks" Mobility  Outcome Measure Help needed turning from your back to your side while in a flat bed without using bedrails?: A Little Help needed moving from lying on your back to sitting on the side of a flat bed without using bedrails?: A Little Help needed moving to and from a bed to a chair (including a wheelchair)?: A Little Help needed standing up from a chair using your arms (e.g., wheelchair or bedside chair)?: A Little Help needed to walk in hospital room?: A Little Help needed climbing 3-5 steps with a railing? : A Little 6 Click Score: 18    End of Session Equipment Utilized During Treatment: Gait belt Activity Tolerance: Patient tolerated treatment well;Patient limited by pain Patient left: in chair;with chair alarm set;with call bell/phone within reach;with family/visitor present;with SCD's reapplied Nurse Communication: Mobility status PT Visit Diagnosis: Other abnormalities of gait and mobility (R26.89);Difficulty in walking, not  elsewhere classified (R26.2)    Time: 1610-9604 PT Time Calculation (min) (ACUTE ONLY): 24 min   Charges:   PT Evaluation $PT Eval Low Complexity: 1 Low PT Treatments $Gait Training: 8-22 mins      Julien Girt, PT Acute Rehabilitation Services Pager [phone]  Office [phone]   Wright Gravely D Elonda Husky 02/13/2018, 3:37 PM

## 2018-02-14 DIAGNOSIS — E663 Overweight: Secondary | ICD-10-CM | POA: Diagnosis present

## 2018-02-14 LAB — BASIC METABOLIC PANEL
Anion gap: 6 (ref 5–15)
BUN: 11 mg/dL (ref 8–23)
CO2: 25 mmol/L (ref 22–32)
Calcium: 8.9 mg/dL (ref 8.9–10.3)
Chloride: 105 mmol/L (ref 98–111)
Creatinine, Ser: 0.61 mg/dL (ref 0.44–1.00)
GFR calc Af Amer: >60 mL/min (ref >60)
GFR calc non Af Amer: >60 mL/min (ref >60)
Glucose, Bld: 165 mg/dL — ABNORMAL HIGH (ref 70–99)
Potassium: 3.9 mmol/L (ref 3.5–5.1)
Sodium: 136 mmol/L (ref 135–145)

## 2018-02-14 LAB — CBC
HCT: 31.9 % — ABNORMAL LOW (ref 36.0–46.0)
Hemoglobin: 9.8 g/dL — ABNORMAL LOW (ref 12.0–15.0)
MCH: 31.2 pg (ref 26.0–34.0)
MCHC: 30.7 g/dL (ref 30.0–36.0)
MCV: 101.6 fL — ABNORMAL HIGH (ref 80.0–100.0)
Platelets: 172 10*3/uL (ref 150–400)
RBC: 3.14 MIL/uL — ABNORMAL LOW (ref 3.87–5.11)
RDW: 13.1 % (ref 11.5–15.5)
WBC: 10 10*3/uL (ref 4.0–10.5)
nRBC: 0 % (ref 0.0–0.2)

## 2018-02-14 NOTE — Progress Notes (Signed)
Physical Therapy Treatment Patient Details Name: Kristin [name] MRN: [id number] DOB: [birth year] Today's Date: 02/14/2018    History of Present Illness 68 yo female s/p L DA-THA on 02/13/18. 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     PT Comments    Pt tolerated increased ambulation distance of 50' with RW, instructed pt in THA HEP.  Will plan an afternoon PT session for stair training, then expect pt will be ready to DC home.   Follow Up Recommendations  Follow surgeon's recommendation for DC plan and follow-up therapies;Supervision for mobility/OOB     Equipment Recommendations  None recommended by PT    Recommendations for Other Services       Precautions / Restrictions Precautions Precautions: Fall Restrictions Weight Bearing Restrictions: No    Mobility  Bed Mobility Overal bed mobility: Needs Assistance Bed Mobility: Supine to Sit     Supine to sit: Min assist;HOB elevated     General bed mobility comments: Min assist for LLE lifting and translation to EOB, increased time and effort.   Transfers Overall transfer level: Needs assistance Equipment used: Rolling walker (2 wheeled) Transfers: Sit to/from Stand Sit to Stand: Min assist;From elevated surface         General transfer comment: Min assist for power up and steadying upon standing, increased time to come to full standing.   Ambulation/Gait Ambulation/Gait assistance: Min guard Gait Distance (Feet): 50 Feet Assistive device: Rolling walker (2 wheeled) Gait Pattern/deviations: Step-to pattern;Decreased weight shift to left;Decreased stance time - left;Antalgic Gait velocity: decreased    General Gait Details: Min guard for safety. Verbal cuing for placement in RW, sequencing, turning.    Stairs             Wheelchair Mobility    Modified Rankin (Stroke Patients Only)       Balance  Overall balance assessment: Mild deficits observed, not formally tested                                          Cognition Arousal/Alertness: Awake/alert Behavior During Therapy: WFL for tasks assessed/performed Overall Cognitive Status: Within Functional Limits for tasks assessed                                        Exercises Total Joint Exercises Ankle Circles/Pumps: AROM;Both;10 reps;Supine Quad Sets: AROM;Both;5 reps;Supine Short Arc Quad: AROM;Left;10 reps;Supine Heel Slides: AAROM;Left;10 reps;Supine Hip ABduction/ADduction: AAROM;Left;10 reps;Supine    General Comments        Pertinent Vitals/Pain Pain Score: 5  Pain Location: L hip, with ambulation  Pain Descriptors / Indicators: Sore Pain Intervention(s): Limited activity within patient's tolerance;Monitored during session;Premedicated before session;Ice applied    Home Living                      Prior Function            PT Goals (current goals can now be found in the care plan section) Acute Rehab PT Goals Patient Stated Goal: walk without pain PT Goal Formulation: With patient Time For Goal Achievement: 02/20/18 Potential to Achieve Goals: Good Progress towards PT goals: Progressing toward goals    Frequency    7X/week  PT Plan Current plan remains appropriate    Co-evaluation              AM-PAC PT "6 Clicks" Mobility   Outcome Measure  Help needed turning from your back to your side while in a flat bed without using bedrails?: A Little Help needed moving from lying on your back to sitting on the side of a flat bed without using bedrails?: A Little Help needed moving to and from a bed to a chair (including a wheelchair)?: A Little Help needed standing up from a chair using your arms (e.g., wheelchair or bedside chair)?: A Little Help needed to walk in hospital room?: A Little Help needed climbing 3-5 steps with a railing? : A Little 6  Click Score: 18    End of Session Equipment Utilized During Treatment: Gait belt Activity Tolerance: Patient tolerated treatment well;Patient limited by pain Patient left: in chair;with chair alarm set;with call bell/phone within reach;with family/visitor present Nurse Communication: Mobility status PT Visit Diagnosis: Other abnormalities of gait and mobility (R26.89);Difficulty in walking, not elsewhere classified (R26.2)     Time: 1111-1140 PT Time Calculation (min) (ACUTE ONLY): 29 min  Charges:  $Gait Training: 8-22 mins $Therapeutic Exercise: 8-22 mins                     Blondell Reveal Kistler PT 02/14/2018  Acute Rehabilitation Services Pager [phone] Office [phone]

## 2018-02-14 NOTE — Discharge Summary (Signed)
Physician Discharge Summary  Patient ID: Kristin [name] MRN: [id number] DOB/AGE: [birth year] 67 y.o.  Admit date: 02/13/2018 Discharge date:  02/14/2018  Procedures:  Procedure(s) (LRB): TOTAL HIP ARTHROPLASTY ANTERIOR APPROACH (Left)  Attending Physician:  Dr. Paralee Cancel   Admission Diagnoses:   Left hip primary OA / pain  Discharge Diagnoses:  Principal Problem:   S/P left THA, AA Active Problems:   Overweight (BMI 25.0-29.9)  Past Medical History:  Diagnosis Date  . AR (allergic rhinitis)   . Atrial fibrillation ([place])   . GERD (gastroesophageal reflux disease)   . Hiatal hernia   . Hiatal hernia   . Migraines   . Mild sleep apnea   . Osteoarthritis   . Osteoarthritis   . Osteopenia after menopause 06/14/2017   DEXA 08/2016 [facility]: T = -1.10 radius, -1.0 femur; lumbar spine elevated due to DJD; recheck 2-3 years.  . Pacemaker    CHB  . Paroxysmal A-fib ([place])   . PUD (peptic ulcer disease)   . Thyroid disease   . Transient complete heart block 1996    HPI:    Kristin [name], 68 y.o. female, has a history of pain and functional disability in the left hip(s) due to arthritis and patient has failed non-surgical conservative treatments for greater than 12 weeks to include NSAID's and/or analgesics, corticosteriod injections and activity modification.  Onset of symptoms was gradual starting <1 year ago with rapidlly worsening course since that time.The patient noted prior procedures of the hip to include arthroplasty on the right hip in 2008 per Dr. Alvan Dame.  Patient currently rates pain in the left hip at 8 out of 10 with activity. Patient has worsening of pain with activity and weight bearing, trendelenberg gait, pain that interfers with activities of daily living and pain with passive range of motion. Patient has evidence of periarticular osteophytes and joint space narrowing by imaging studies. This condition presents safety issues increasing the risk of falls. There is no  current active infection.  Risks, benefits and expectations were discussed with the patient.  Risks including but not limited to the risk of anesthesia, blood clots, nerve damage, blood vessel damage, failure of the prosthesis, infection and up to and including death.  Patient understand the risks, benefits and expectations and wishes to proceed with surgery.   PCP: Leamon Arnt, MD   Discharged Condition: good  Hospital Course:  Patient underwent the above stated procedure on 02/13/2018. Patient tolerated the procedure well and brought to the recovery room in good condition and subsequently to the floor.  POD #1 BP: 106/68 ; Pulse: 65 ; Temp: 97.7 F (36.5 C) ; Resp: 16 Patient reports pain as mild, pain controlled. No events throughout the night.  Dr. Alvan Dame discussed the procedure and post-op expectations. Patient ready to be discharged home. Dorsiflexion/plantar flexion intact, incision: dressing C/D/I, no cellulitis present and compartment soft.    LABS  Basename    HGB     9.8  HCT     31.9     Discharge Exam: General appearance: alert, cooperative and no distress Extremities: Homans sign is negative, no sign of DVT, no edema, redness or tenderness in the calves or thighs and no ulcers, gangrene or trophic changes  Disposition: Home with follow up in 2 weeks   Follow-up Information    Paralee Cancel, MD. Schedule an appointment as soon as possible for a visit in 2 weeks.   Specialty:  Orthopedic Surgery Contact information: [address]  [address] [place] [address] [phone]           Discharge Instructions    Call MD / Call 911   Complete by:  As directed    If you experience chest pain or shortness of breath, CALL 911 and be transported to the hospital emergency room.  If you develope a fever above 101 F, pus (white drainage) or increased drainage or redness at the wound, or calf pain, call your surgeon's office.   Change dressing   Complete by:  As  directed    Maintain surgical dressing until follow up in the clinic. If the edges start to pull up, may reinforce with tape. If the dressing is no longer working, may remove and cover with gauze and tape, but must keep the area dry and clean.  Call with any questions or concerns.   Constipation Prevention   Complete by:  As directed    Drink plenty of fluids.  Prune juice may be helpful.  You may use a stool softener, such as Colace (over the counter) 100 mg twice a day.  Use MiraLax (over the counter) for constipation as needed.   Diet - low sodium heart healthy   Complete by:  As directed    Discharge instructions   Complete by:  As directed    Maintain surgical dressing until follow up in the clinic. If the edges start to pull up, may reinforce with tape. If the dressing is no longer working, may remove and cover with gauze and tape, but must keep the area dry and clean.  Follow up in 2 weeks at [facility]. Call with any questions or concerns.   Increase activity slowly as tolerated   Complete by:  As directed    Weight bearing as tolerated with assist device (walker, cane, etc) as directed, use it as long as suggested by your surgeon or therapist, typically at least 4-6 weeks.   TED hose   Complete by:  As directed    Use stockings (TED hose) for 2 weeks on both leg(s).  You may remove them at night for sleeping.      Allergies as of 02/14/2018      Reactions   Morphine And Related Hives, Itching   Neomycin Other (See Comments)   itching    Adhesive [tape] Rash   Blistering rash       Medication List    TAKE these medications   amoxicillin 500 MG capsule Commonly known as:  AMOXIL Take 2,000 mg by mouth See admin instructions. Before Dental Visits   B-12 1000 MCG Subl Place 1 mL under the tongue 2 (two) times a week.   Calcium/Vitamin D 600-400 MG-UNIT Tabs Take 1 tablet by mouth daily.   celecoxib 100 MG capsule Commonly known as:  CELEBREX Take 1 capsule  (100 mg total) by mouth daily.   diltiazem 30 MG tablet Commonly known as:  CARDIZEM Take 1-2 tablets by mouth every 6 hours as needed for palpitations   docusate sodium 100 MG capsule Commonly known as:  COLACE Take 1 capsule (100 mg total) by mouth 2 (two) times daily.   ELIQUIS 5 MG Tabs tablet Generic drug:  apixaban TAKE 1 TABLET BY MOUTH TWICE DAILY. What changed:  how much to take   ferrous sulfate 325 (65 FE) MG tablet Commonly known as:  FERROUSUL Take 1 tablet (325 mg total) by mouth 3 (three) times daily with meals.   fexofenadine 180 MG tablet Commonly  known as:  ALLEGRA Take 180 mg by mouth daily.   HYDROcodone-acetaminophen 7.5-325 MG tablet Commonly known as:  NORCO Take 1-2 tablets by mouth every 4 (four) hours as needed for moderate pain.   levothyroxine 125 MCG tablet Commonly known as:  SYNTHROID, LEVOTHROID Take 1 tablet (125 mcg total) by mouth daily.   Magnesium 250 MG Tabs Take 250 mg by mouth daily.   methocarbamol 500 MG tablet Commonly known as:  ROBAXIN Take 1 tablet (500 mg total) by mouth every 6 (six) hours as needed for muscle spasms.   omeprazole 40 MG capsule Commonly known as:  PRILOSEC Take 1 capsule (40 mg total) by mouth daily.   polyethylene glycol packet Commonly known as:  MIRALAX / GLYCOLAX Take 17 g by mouth 2 (two) times daily.   Potassium 99 MG Tabs Take 1 tablet by mouth daily.   PROBIOTIC DAILY PO Take 1 capsule by mouth daily.   sodium chloride 0.65 % Soln nasal spray Commonly known as:  OCEAN Place 1 spray into both nostrils as needed for congestion.   vitamin C 500 MG tablet Commonly known as:  ASCORBIC ACID Take 500 mg by mouth daily.            Discharge Care Instructions  (From admission, onward)         Start     Ordered   02/14/18 0000  Change dressing    Comments:  Maintain surgical dressing until follow up in the clinic. If the edges start to pull up, may reinforce with tape. If the  dressing is no longer working, may remove and cover with gauze and tape, but must keep the area dry and clean.  Call with any questions or concerns.   02/14/18 0755           Signed: [place] Pugh. Esa Raden   PA-C  02/14/2018, 8:40 AM

## 2018-02-14 NOTE — Progress Notes (Signed)
     Subjective: 1 Day Post-Op Procedure(s) (LRB): TOTAL HIP ARTHROPLASTY ANTERIOR APPROACH (Left)   Patient reports pain as mild, pain controlled. No events throughout the night.  Dr. Alvan Dame discussed the procedure and post-op expectations. Patient ready to be discharged home if she does well with PT.    Objective:   VITALS:   Vitals:   02/14/18 0110 02/14/18 0608  BP: 100/62 106/68  Pulse: 66 65  Resp: 16 16  Temp: 97.6 F (36.4 C) 97.7 F (36.5 C)  SpO2: 100% 99%    Dorsiflexion/Plantar flexion intact Incision: dressing C/D/I No cellulitis present Compartment soft  LABS Recent Labs    02/14/18 0318  HGB 9.8*  HCT 31.9*  WBC 10.0  PLT 172    Recent Labs    02/14/18 0318  NA 136  K 3.9  BUN 11  CREATININE 0.61  GLUCOSE 165*     Assessment/Plan: 1 Day Post-Op Procedure(s) (LRB): TOTAL HIP ARTHROPLASTY ANTERIOR APPROACH (Left) Foley cath d/c'ed Advance diet Up with therapy D/C IV fluids Discharge home Follow up in 2 weeks at [facility] ([place]). Follow up with OLIN,Andrei Mccook D in 2 weeks.  Contact information:  [facility]) [address] [place] [phone]    Overweight (BMI 25-29.9) Estimated body mass index is 29.95 kg/m as calculated from the following:   Height as of this encounter: 5\' 8"  (1.727 m).   Weight as of this encounter: 89.4 kg. Patient also counseled that weight may inhibit the healing process Patient counseled that losing weight will help with future health issues         West Pugh. Idelle Reimann   PAC  02/14/2018, 7:52 AM

## 2018-02-14 NOTE — Progress Notes (Signed)
Patient discharged to home. Given all belongings, instructions, prescriptions, equipment. Patient and husband verbalized understanding of all instructions. Escorted to pov via w/c.

## 2018-02-14 NOTE — Progress Notes (Signed)
Physical Therapy Treatment Patient Details Name: Kristin [name] MRN: [id number] DOB: 1950-04-07 Today's Date: 02/14/2018    History of Present Illness 68 yo female s/p L DA-THA on 02/13/18. 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     PT Comments    Stair training completed with pt/spouse. From PT standpoint, pt is ready to DC home.   Follow Up Recommendations  Follow surgeon's recommendation for DC plan and follow-up therapies;Supervision for mobility/OOB     Equipment Recommendations  None recommended by PT    Recommendations for Other Services       Precautions / Restrictions Precautions Precautions: Fall Restrictions Weight Bearing Restrictions: No    Mobility  Bed Mobility Overal bed mobility: Needs Assistance Bed Mobility: Supine to Sit     Supine to sit: Min assist;HOB elevated     General bed mobility comments: up in recliner  Transfers Overall transfer level: Needs assistance Equipment used: Rolling walker (2 wheeled) Transfers: Sit to/from Stand Sit to Stand: From elevated surface;Min guard         General transfer comment: Min assist for power up and steadying upon standing, increased time to come to full standing.   Ambulation/Gait Ambulation/Gait assistance: Min guard Gait Distance (Feet): 40 Feet Assistive device: Rolling walker (2 wheeled) Gait Pattern/deviations: Step-to pattern;Decreased weight shift to left;Decreased stance time - left;Antalgic Gait velocity: decreased    General Gait Details: Min guard for safety. Verbal cuing for placement in RW, sequencing.   Stairs Stairs: Yes Stairs assistance: Min assist Stair Management: No rails;Step to pattern;Backwards;With walker Number of Stairs: 3 General stair comments: min A to manage/steady RW, pt's husband assisted with management of RW, VCs sequencing   Wheelchair Mobility     Modified Rankin (Stroke Patients Only)       Balance Overall balance assessment: Mild deficits observed, not formally tested                                      EXERCISES: long arc quads x 10 AAROM LLE     Cognition Arousal/Alertness: Awake/alert Behavior During Therapy: WFL for tasks assessed/performed Overall Cognitive Status: Within Functional Limits for tasks assessed                                              General Comments        Pertinent Vitals/Pain Pain Score: 6  Pain Location: L hip, with ambulation  Pain Descriptors / Indicators: Sore Pain Intervention(s): Limited activity within patient's tolerance;Monitored during session;Premedicated before session;Ice applied    Home Living                      Prior Function            PT Goals (current goals can now be found in the care plan section) Acute Rehab PT Goals Patient Stated Goal: walk without pain PT Goal Formulation: With patient Time For Goal Achievement: 02/20/18 Potential to Achieve Goals: Good Progress towards PT goals: Progressing toward goals    Frequency    7X/week      PT Plan Current plan remains appropriate    Co-evaluation  AM-PAC PT "6 Clicks" Mobility   Outcome Measure  Help needed turning from your back to your side while in a flat bed without using bedrails?: A Little Help needed moving from lying on your back to sitting on the side of a flat bed without using bedrails?: A Little Help needed moving to and from a bed to a chair (including a wheelchair)?: A Little Help needed standing up from a chair using your arms (e.g., wheelchair or bedside chair)?: A Little Help needed to walk in hospital room?: A Little Help needed climbing 3-5 steps with a railing? : A Little 6 Click Score: 18    End of Session Equipment Utilized During Treatment: Gait belt Activity Tolerance: Patient tolerated treatment  well;Patient limited by pain Patient left: in chair;with call bell/phone within reach;with family/visitor present Nurse Communication: Mobility status PT Visit Diagnosis: Other abnormalities of gait and mobility (R26.89);Difficulty in walking, not elsewhere classified (R26.2)     Time: 1119-1140 PT Time Calculation (min) (ACUTE ONLY): 21 min  Charges:  $Gait Training: 8-22 mins $Therapeutic Exercise: 8-22 mins                    Blondell Reveal Kistler PT 02/14/2018  Acute Rehabilitation Services Pager [phone] Office [phone]

## 2018-02-19 ENCOUNTER — Encounter (HOSPITAL_COMMUNITY): Payer: Self-pay | Admitting: Orthopedic Surgery

## 2018-05-14 ENCOUNTER — Ambulatory Visit (INDEPENDENT_AMBULATORY_CARE_PROVIDER_SITE_OTHER): Payer: BC Managed Care – PPO | Admitting: *Deleted

## 2018-05-14 ENCOUNTER — Other Ambulatory Visit: Payer: Self-pay

## 2018-05-14 DIAGNOSIS — I442 Atrioventricular block, complete: Secondary | ICD-10-CM | POA: Diagnosis not present

## 2018-05-14 LAB — CUP PACEART REMOTE DEVICE CHECK
Battery Impedance: 4007 Ohm
Battery Remaining Longevity: 20 mo
Battery Voltage: 2.71 V
Brady Statistic RV Percent Paced: 0 %
Date Time Interrogation Session: 20200420141039
Implantable Lead Implant Date: 19960815
Implantable Lead Implant Date: 19960815
Implantable Lead Location: 753859
Implantable Lead Location: 753860
Implantable Lead Model: 5024
Implantable Pulse Generator Implant Date: 20090205
Lead Channel Impedance Value: 67 Ohm
Lead Channel Impedance Value: 781 Ohm
Lead Channel Pacing Threshold Amplitude: 1.125 V
Lead Channel Pacing Threshold Pulse Width: 0.4 ms
Lead Channel Setting Pacing Amplitude: 2.25 V
Lead Channel Setting Pacing Pulse Width: 0.4 ms
Lead Channel Setting Sensing Sensitivity: 2.8 mV

## 2018-05-17 ENCOUNTER — Telehealth (HOSPITAL_COMMUNITY): Payer: Self-pay | Admitting: *Deleted

## 2018-05-17 ENCOUNTER — Encounter (HOSPITAL_COMMUNITY): Payer: Self-pay

## 2018-05-17 NOTE — Telephone Encounter (Addendum)
   Notes recorded by [name], MD on 05/16/2018 at 11:28 PM EDT Remote device check reviewed.  Device report notable for: afib burden appears to have increased substantially. V rates are elevated Please refer to AF clinic for telehealth visit to further evaluate for symptoms of AF. May benefit from increased rate control and possibly rhythm control depending on symptoms.   Pt is symptomatic with afib episodes and is aware of the increased burden. appt made for 4/24 with Adline Peals, PA. Pt in agreement.

## 2018-05-18 ENCOUNTER — Other Ambulatory Visit: Payer: Self-pay

## 2018-05-18 ENCOUNTER — Ambulatory Visit (HOSPITAL_COMMUNITY)
Admission: RE | Admit: 2018-05-18 | Discharge: 2018-05-18 | Disposition: A | Discharge status: Home / Self Care | Payer: BC Managed Care – PPO | Source: Ambulatory Visit | Attending: Physician Assistant | Admitting: Physician Assistant

## 2018-05-18 ENCOUNTER — Encounter (HOSPITAL_COMMUNITY): Payer: Self-pay | Admitting: Physician Assistant

## 2018-05-18 ENCOUNTER — Other Ambulatory Visit (HOSPITAL_COMMUNITY): Payer: Self-pay | Admitting: *Deleted

## 2018-05-18 ENCOUNTER — Encounter (HOSPITAL_COMMUNITY): Payer: Self-pay

## 2018-05-18 VITALS — Ht 68.0 in

## 2018-05-18 DIAGNOSIS — I480 Paroxysmal atrial fibrillation: Secondary | ICD-10-CM | POA: Diagnosis not present

## 2018-05-18 MED ORDER — DILTIAZEM HCL ER COATED BEADS 120 MG PO CP24: 120.0000 mg | ORAL_CAPSULE | Freq: Every day | ORAL | 3 refills | Status: DC

## 2018-05-18 MED ORDER — DILTIAZEM HCL 30 MG PO TABS: ORAL_TABLET | ORAL | 3 refills | Status: DC

## 2018-05-18 NOTE — Progress Notes (Signed)
Electrophysiology TeleHealth Note   Due to national recommendations of social distancing due to [place] 19, Audio/video telehealth visit is felt to be most appropriate for this patient at this time.  See MyChart message from today for patient consent regarding telehealth for the Atrial Fibrillation Clinic.    Date:  05/18/2018   ID:  Kristin [name], DOB 22-Feb-1950, MRN [id number]  Location: home  Provider location: [address] [place], [place] [address] Evaluation Performed: Follow up  PCP:  Leamon Arnt, MD  Primary Electrophysiologist: Dr Rayann Heman   CC: Follow up for atrial fibrillation   History of Present Illness: Kristin [name] is a 68 y.o. female who presents via audio/video conferencing for a telehealth visit today. Patient reports that since her surgery in January, she has had more episodes of afib. She has symptoms of irregular pulse, lightheadedness, SOB, and heart racing. The episodes usually terminate on their own but also respond to PRN diltiazem. She is unsure how often she has symptoms. Recent remote device transmission confirmed that she has had increased AF burden with episodes of RVR.  Today, she denies symptoms of chest pain, shortness of breath, orthopnea, PND, lower extremity edema, claudication, syncope, bleeding, or neurologic sequela. The patient is tolerating medications without difficulties and is otherwise without complaint today.   she denies symptoms of cough, fevers, chills, or new SOB worrisome for COVID 19.     Atrial Fibrillation Risk Factors:  she does not have symptoms or diagnosis of sleep apnea. Borderline sleep study prior to losing weight. she does not have a history of rheumatic fever. she does not have a history of alcohol use. The patient does not have a history of early familial atrial fibrillation or other arrhythmias.  she has a BMI of Body mass index is 29.95 kg/m.Marland Kitchen There were no vitals filed for this visit.  Past Medical History:   Diagnosis Date  . AR (allergic rhinitis)   . Atrial fibrillation ([place])   . GERD (gastroesophageal reflux disease)   . Hiatal hernia   . Hiatal hernia   . Migraines   . Mild sleep apnea   . Osteoarthritis   . Osteoarthritis   . Osteopenia after menopause 06/14/2017   DEXA 08/2016 [facility]: T = -1.10 radius, -1.0 femur; lumbar spine elevated due to DJD; recheck 2-3 years.  . Pacemaker    CHB  . Paroxysmal A-fib ([place])   . PUD (peptic ulcer disease)   . Thyroid disease   . Transient complete heart block 1996   Past Surgical History:  Procedure Laterality Date  . HIP ARTHROPLASTY Right 2008  . PACEMAKER GENERATOR CHANGE  03/01/2007   per[facility] Rosita Fire at [facility])  . PACEMAKER INSERTION  09/08/1994   performed in [facility] for transient complete heart block  . THYROIDECTOMY, PARTIAL  1986   BENIGN NODULES  . TOTAL HIP ARTHROPLASTY Left 02/13/2018   Procedure: TOTAL HIP ARTHROPLASTY ANTERIOR APPROACH;  Surgeon: Paralee Cancel, MD;  Location: [facility] ORS;  Service: Orthopedics;  Laterality: Left;  70 mins  . TUBAL LIGATION  1987     Current Outpatient Medications  Medication Sig Dispense Refill  . amoxicillin (AMOXIL) 500 MG capsule Take 2,000 mg by mouth See admin instructions. Before Dental Visits    . Calcium Carb-Cholecalciferol (CALCIUM/VITAMIN D) 600-400 MG-UNIT TABS Take 1 tablet by mouth daily.     . celecoxib (CELEBREX) 100 MG capsule Take 1 capsule (100 mg total) by mouth daily. 30 capsule  5  . Cyanocobalamin (B-12) 1000 MCG SUBL Place 1 mL under the tongue 2 (two) times a week.    . diltiazem (CARDIZEM) 30 MG tablet Take 1-2 tablets by mouth every 6 hours as needed for palpitations 30 tablet 3  . docusate sodium (COLACE) 100 MG capsule Take 1 capsule (100 mg total) by mouth 2 (two) times daily. 10 capsule 0  . ELIQUIS 5 MG TABS tablet TAKE 1 TABLET BY MOUTH TWICE DAILY. (Patient taking differently: Take 5 mg by mouth 2 (two) times daily. ) 180 tablet 1   . fexofenadine (ALLEGRA) 180 MG tablet Take 180 mg by mouth daily.    Marland Kitchen levothyroxine (SYNTHROID, LEVOTHROID) 125 MCG tablet Take 1 tablet (125 mcg total) by mouth daily. 90 tablet 3  . Magnesium 250 MG TABS Take 250 mg by mouth daily.    Marland Kitchen omeprazole (PRILOSEC) 40 MG capsule Take 1 capsule (40 mg total) by mouth daily. 180 capsule 3  . polyethylene glycol (MIRALAX / GLYCOLAX) packet Take 17 g by mouth 2 (two) times daily. 14 each 0  . Potassium 99 MG TABS Take 1 tablet by mouth daily.    . Probiotic Product (PROBIOTIC DAILY PO) Take 1 capsule by mouth daily.    . sodium chloride (OCEAN) 0.65 % SOLN nasal spray Place 1 spray into both nostrils as needed for congestion.    . vitamin C (ASCORBIC ACID) 500 MG tablet Take 500 mg by mouth daily.     No current facility-administered medications for this encounter.     Allergies:   Morphine and related; Neomycin; and Adhesive [tape]   Social History:  The patient  reports that she has never smoked. She has never used smokeless tobacco. She reports current alcohol use of about 1.0 standard drinks of alcohol per week. She reports that she does not use drugs.   Family History:  The patient's  family history includes Arthritis in her mother; COPD in her brother; Cancer in her maternal grandmother; Diabetes Mellitus II in her maternal uncle; Drug abuse in her brother.    ROS:  Please see the history of present illness.   All other systems are personally reviewed and negative.   Exam: Well appearing, alert and conversant, regular work of breathing,  good skin color  Recent Labs: 10/20/2017: ALT 12; TSH 0.39 02/14/2018: BUN 11; Creatinine, Ser 0.61; Hemoglobin 9.8; Platelets 172; Potassium 3.9; Sodium 136  personally reviewed    Other studies personally reviewed: Additional studies/ records that were reviewed today include: Epic notes    ASSESSMENT AND PLAN:  1. Paroxysmal atrial fibrillation Patient seems to be having more frequent episodes.  We discussed therapeutic options including increasing rate control, flecainide, Multaq, and amiodarone. Patient would like to try increase rate control as her AF episodes have responded well to diltiazem in the past. Will start diltiazem 120 mg daily Continue diltiazem 30 mg 1-2 pills PRN for heart racing. Continue Eliquis 5 mg BID. Pt prefers to continue. Lifestyle modification was discussed and encouraged including regular physical activity and weight reduction. Patient has had success losing weight with current diet. We also discussed Apple Watch and Jodelle Red as potential aids to help monitor her AF in real-time. Will also consider updating echocardiogram once COVID-19 precautions end.  This patients CHA2DS2-VASc Score and unadjusted Ischemic Stroke Rate (% per year) is equal to 2.2 % stroke rate/year from a score of 2  Above score calculated as 1 point each if present [CHF, HTN, DM, Vascular=MI/PAD/Aortic Plaque, Age  if 65-74, or Female] Above score calculated as 2 points each if present [Age > 75, or Stroke/TIA/TE]  2. Transient CHB S/p PPM followed by Dr Rayann Heman and device clinic.   COVID screen The patient does not have any symptoms that suggest any further testing/ screening at this time.  Social distancing reinforced today.    Follow-up with AF clinic in 2 weeks.  Current medicines are reviewed at length with the patient today.   The patient does not have concerns regarding her medicines.  The following changes were made today:  Start diltiazem 120 mg daily  Labs/ tests ordered today include:  No orders of the defined types were placed in this encounter.   Patient Risk:  after full review of this patients clinical status, I feel that they are at moderate risk at this time.   Today, I have spent 35 minutes with the patient with telehealth technology discussing atrial fibrillation, medications, lifestyle changes, and COVID-19 precautions.    Gwenlyn Perking PA-C 05/18/2018  12:36 PM  Afib [facility] [address] [place], [place] [address] [phone]

## 2018-05-22 ENCOUNTER — Encounter: Payer: Self-pay | Admitting: Cardiology

## 2018-05-22 NOTE — Progress Notes (Signed)
Remote pacemaker transmission.   

## 2018-05-29 ENCOUNTER — Telehealth: Payer: Self-pay | Admitting: Family Medicine

## 2018-05-29 NOTE — Telephone Encounter (Signed)
Copied from [place] [id number]. Topic: Quick Communication - See Telephone Encounter >> May 29, 2018 12:11 PM Valla Leaver wrote: CRM for notification. See Telephone encounter for: 05/29/18. Patient wants to decrease omeprazole (PRILOSEC) 40 MG capsule to 20 mg once per day. Please advise.

## 2018-05-29 NOTE — Telephone Encounter (Signed)
See note

## 2018-05-30 MED ORDER — OMEPRAZOLE 20 MG PO CPDR: 20.0000 mg | DELAYED_RELEASE_CAPSULE | Freq: Every day | ORAL | 1 refills | Status: DC

## 2018-05-30 NOTE — Telephone Encounter (Signed)
Last OV was 09/2017, do you want her to have virtual visit?

## 2018-05-30 NOTE — Telephone Encounter (Signed)
Ok to decrease to 20mg  daily. Can fill new dose if needed. No visit needed. Thanks.

## 2018-05-30 NOTE — Telephone Encounter (Signed)
Pt aware RX decreased and new RX sent to [facility].

## 2018-06-07 ENCOUNTER — Other Ambulatory Visit: Payer: Self-pay

## 2018-06-07 ENCOUNTER — Ambulatory Visit: Payer: BC Managed Care – PPO | Admitting: *Deleted

## 2018-06-07 DIAGNOSIS — I442 Atrioventricular block, complete: Secondary | ICD-10-CM

## 2018-06-07 LAB — CUP PACEART REMOTE DEVICE CHECK
Battery Impedance: 4117 Ohm
Battery Remaining Longevity: 19 mo
Battery Voltage: 2.73 V
Brady Statistic RV Percent Paced: 0 %
Date Time Interrogation Session: 20200514140714
Implantable Lead Implant Date: 19960815
Implantable Lead Implant Date: 19960815
Implantable Lead Location: 753859
Implantable Lead Location: 753860
Implantable Lead Model: 5024
Implantable Pulse Generator Implant Date: 20090205
Lead Channel Impedance Value: 67 Ohm
Lead Channel Impedance Value: 762 Ohm
Lead Channel Pacing Threshold Amplitude: 1 V
Lead Channel Pacing Threshold Pulse Width: 0.4 ms
Lead Channel Setting Pacing Amplitude: 2 V
Lead Channel Setting Pacing Pulse Width: 0.4 ms
Lead Channel Setting Sensing Sensitivity: 2.8 mV

## 2018-06-08 ENCOUNTER — Other Ambulatory Visit: Payer: Self-pay

## 2018-06-08 ENCOUNTER — Ambulatory Visit (HOSPITAL_COMMUNITY)
Admission: RE | Admit: 2018-06-08 | Discharge: 2018-06-08 | Disposition: A | Discharge status: Home / Self Care | Payer: BC Managed Care – PPO | Source: Ambulatory Visit | Attending: Physician Assistant | Admitting: Physician Assistant

## 2018-06-08 VITALS — BP 114/63 | HR 76

## 2018-06-08 DIAGNOSIS — I480 Paroxysmal atrial fibrillation: Secondary | ICD-10-CM

## 2018-06-08 NOTE — Progress Notes (Signed)
Electrophysiology TeleHealth Note   Due to national recommendations of social distancing due to [place] 19, Audio/video telehealth visit is felt to be most appropriate for this patient at this time.  See MyChart message for patient consent regarding telehealth for the Atrial Fibrillation Clinic. Consent obtained verbally.   Date:  06/08/2018   ID:  Kristin [name], DOB 1950-08-26, MRN [id number]  Location: home  Provider location: [address] [place], [place] [address] Evaluation Performed: Follow up  PCP:  Leamon Arnt, MD  Primary Electrophysiologist: Dr Rayann Heman   CC: Follow up for atrial fibrillation   History of Present Illness: Kristin [name] is a 68 y.o. female who presents via audio/video conferencing for a telehealth visit today. Patient reports that she has done very well since her last visit. She has not had any symptoms of irregular pulse, SOB, lightheadedness, or heart racing since starting the daily diltiazem. Pacer remote transmission showed one episode of high ventricular rate which lasted about 4 minutes, patient asymptomatic.   Today, she denies symptoms of palpitations, chest pain, shortness of breath, orthopnea, PND, lower extremity edema, claudication, syncope, bleeding, or neurologic sequela. The patient is tolerating medications without difficulties and is otherwise without complaint today.   she denies symptoms of cough, fevers, chills, or new SOB worrisome for COVID 19.     Atrial Fibrillation Risk Factors:  she does not have symptoms or diagnosis of sleep apnea. Borderline sleep study prior to losing weight. she does not have a history of rheumatic fever. she does not have a history of alcohol use. The patient does not have a history of early familial atrial fibrillation or other arrhythmias.  she has a BMI of There is no height or weight on file to calculate BMI..  BP 114/63 Pulse 76 Provided by pt with home BP machine.  Past Medical History:    Diagnosis Date   AR (allergic rhinitis)    Atrial fibrillation (HCC)    GERD (gastroesophageal reflux disease)    Hiatal hernia    Hiatal hernia    Migraines    Mild sleep apnea    Osteoarthritis    Osteoarthritis    Osteopenia after menopause 06/14/2017   DEXA 08/2016 [facility]: T = -1.10 radius, -1.0 femur; lumbar spine elevated due to DJD; recheck 2-3 years.   Pacemaker    CHB   Paroxysmal A-fib ([place])    PUD (peptic ulcer disease)    Thyroid disease    Transient complete heart block 1996   Past Surgical History:  Procedure Laterality Date   HIP ARTHROPLASTY Right 2008   PACEMAKER GENERATOR CHANGE  03/01/2007   performed by Dr Rosita Fire at [facility]   PACEMAKER INSERTION  09/08/1994   performed in [facility] for transient complete heart block   THYROIDECTOMY, PARTIAL  1986   [place] HIP ARTHROPLASTY Left 02/13/2018   Procedure: TOTAL HIP ARTHROPLASTY ANTERIOR APPROACH;  Surgeon: Paralee Cancel, MD;  Location: [facility];  Service: Orthopedics;  Laterality: Left;  70 mins   TUBAL LIGATION  1987     Current Outpatient Medications  Medication Sig Dispense Refill   amoxicillin (AMOXIL) 500 MG capsule Take 2,000 mg by mouth See admin instructions. Before Dental Visits     Calcium Carb-Cholecalciferol (CALCIUM/VITAMIN D) 600-400 MG-UNIT TABS Take 1 tablet by mouth daily.      celecoxib (CELEBREX) 100 MG capsule Take 1 capsule (100 mg total) by mouth daily. 30 capsule 5  Cyanocobalamin (B-12) 1000 MCG SUBL Place 1 mL under the tongue 2 (two) times a week.     diltiazem (CARDIZEM CD) 120 MG 24 hr capsule Take 1 capsule (120 mg total) by mouth daily. 30 capsule 3   diltiazem (CARDIZEM) 30 MG tablet Take 1 tablet every 4 hours AS NEEDED for AFIB heart rate >100 45 tablet 3   docusate sodium (COLACE) 100 MG capsule Take 1 capsule (100 mg total) by mouth 2 (two) times daily. 10 capsule 0   ELIQUIS 5 MG TABS tablet TAKE 1 TABLET BY  MOUTH TWICE DAILY. (Patient taking differently: Take 5 mg by mouth 2 (two) times daily. ) 180 tablet 1   fexofenadine (ALLEGRA) 180 MG tablet Take 180 mg by mouth daily.     levothyroxine (SYNTHROID, LEVOTHROID) 125 MCG tablet Take 1 tablet (125 mcg total) by mouth daily. 90 tablet 3   Magnesium 250 MG TABS Take 250 mg by mouth daily.     omeprazole (PRILOSEC) 20 MG capsule Take 1 capsule (20 mg total) by mouth daily. 90 capsule 1   polyethylene glycol (MIRALAX / GLYCOLAX) packet Take 17 g by mouth 2 (two) times daily. 14 each 0   Potassium 99 MG TABS Take 1 tablet by mouth daily.     Probiotic Product (PROBIOTIC DAILY PO) Take 1 capsule by mouth daily.     sodium chloride (OCEAN) 0.65 % SOLN nasal spray Place 1 spray into both nostrils as needed for congestion.     vitamin C (ASCORBIC ACID) 500 MG tablet Take 500 mg by mouth daily.     No current facility-administered medications for this encounter.     Allergies:   Morphine and related; Neomycin; and Adhesive [tape]   Social History:  The patient  reports that she has never smoked. She has never used smokeless tobacco. She reports current alcohol use of about 1.0 standard drinks of alcohol per week. She reports that she does not use drugs.   Family History:  The patient's  family history includes Arthritis in her mother; COPD in her brother; Cancer in her maternal grandmother; Diabetes Mellitus II in her maternal uncle; Drug abuse in her brother.    ROS:  Please see the history of present illness.   All other systems are personally reviewed and negative.   Exam: Well appearing, alert and conversant, regular work of breathing,  good skin color Eyes- anicteric, neuro- grossly intact, skin- no apparent rash or lesions or cyanosis, mouth- oral mucosa is pink   Recent Labs: 10/20/2017: ALT 12; TSH 0.39 02/14/2018: BUN 11; Creatinine, Ser 0.61; Hemoglobin 9.8; Platelets 172; Potassium 3.9; Sodium 136  personally reviewed    Other  studies personally reviewed: Additional studies/ records that were reviewed today include: Epic notes    ASSESSMENT AND PLAN:  1. Paroxysmal atrial fibrillation Patient doing well on diltiazem, asymptomatic.  Continue diltiazem 120 mg daily Continue diltiazem 30 mg 1-2 pills PRN for heart racing. Continue Eliquis 5 mg BID. Pt prefers to continue. May consider AAD if afib burden increases and patient has worsening symptoms. Encouraged her to continue lifestyle modification including regular physical activity and weight reduction.  This patients CHA2DS2-VASc Score and unadjusted Ischemic Stroke Rate (% per year) is equal to 2.2 % stroke rate/year from a score of 2  Above score calculated as 1 point each if present [CHF, HTN, DM, Vascular=MI/PAD/Aortic Plaque, Age if 65-74, or Female] Above score calculated as 2 points each if present [Age > 75, or Stroke/TIA/TE]  2. Transient CHB S/p PPM, followed by Dr Rayann Heman and device clinic.    COVID screen The patient does not have any symptoms that suggest any further testing/ screening at this time.  Social distancing reinforced today.    Follow-up with AF clinic in 3 months with telehealth visit.  Current medicines are reviewed at length with the patient today.   The patient does not have concerns regarding her medicines.  The following changes were made today:  none  Labs/ tests ordered today include:  No orders of the defined types were placed in this encounter.   Patient Risk:  after full review of this patients clinical status, I feel that they are at moderate risk at this time.   Today, I have spent 19 minutes with the patient with telehealth technology discussing atrial fibrillation, medications, lifestyle changes, and COVID-19 precautions.    Gwenlyn Perking PA-C 06/08/2018 10:33 AM  [facility] [address] [place], [place] [address] [phone]

## 2018-06-13 ENCOUNTER — Encounter: Payer: Self-pay | Admitting: Cardiology

## 2018-06-13 NOTE — Progress Notes (Signed)
Remote pacemaker transmission.   

## 2018-06-14 ENCOUNTER — Other Ambulatory Visit (HOSPITAL_COMMUNITY): Payer: Self-pay | Admitting: *Deleted

## 2018-06-14 DIAGNOSIS — I480 Paroxysmal atrial fibrillation: Secondary | ICD-10-CM

## 2018-06-27 ENCOUNTER — Other Ambulatory Visit: Payer: Self-pay | Admitting: Internal Medicine

## 2018-06-27 NOTE — Telephone Encounter (Signed)
Eliquis 5mg  refill request received; pt is 68 yrs old, wt-89.7kg, Crea-0.61 on 02/14/2018, last seen by Malka So, PA with Afib Clinic on 06/08/2018; will send in refill to requested pharmacy.

## 2018-07-24 ENCOUNTER — Ambulatory Visit (HOSPITAL_COMMUNITY)
Admission: RE | Admit: 2018-07-24 | Discharge: 2018-07-24 | Disposition: A | Discharge status: Home / Self Care | Payer: BC Managed Care – PPO | Source: Ambulatory Visit | Attending: Physician Assistant | Admitting: Physician Assistant

## 2018-07-24 ENCOUNTER — Other Ambulatory Visit: Payer: Self-pay

## 2018-07-24 DIAGNOSIS — I480 Paroxysmal atrial fibrillation: Secondary | ICD-10-CM | POA: Insufficient documentation

## 2018-07-24 DIAGNOSIS — I341 Nonrheumatic mitral (valve) prolapse: Secondary | ICD-10-CM | POA: Diagnosis not present

## 2018-07-24 DIAGNOSIS — Z950 Presence of cardiac pacemaker: Secondary | ICD-10-CM | POA: Insufficient documentation

## 2018-07-24 NOTE — Progress Notes (Signed)
  Echocardiogram 2D Echocardiogram has been performed.  Kristin [name] 07/24/2018, 2:39 PM

## 2018-08-29 ENCOUNTER — Other Ambulatory Visit: Payer: Self-pay | Admitting: Family Medicine

## 2018-08-29 DIAGNOSIS — Z1231 Encounter for screening mammogram for malignant neoplasm of breast: Secondary | ICD-10-CM

## 2018-09-06 ENCOUNTER — Ambulatory Visit (INDEPENDENT_AMBULATORY_CARE_PROVIDER_SITE_OTHER): Payer: Medicare Other | Admitting: *Deleted

## 2018-09-06 DIAGNOSIS — I442 Atrioventricular block, complete: Secondary | ICD-10-CM | POA: Diagnosis not present

## 2018-09-06 LAB — CUP PACEART REMOTE DEVICE CHECK
Battery Impedance: 4439 Ohm
Battery Remaining Longevity: 17 mo
Battery Voltage: 2.72 V
Brady Statistic RV Percent Paced: 0 %
Date Time Interrogation Session: 20200813103955
Implantable Lead Implant Date: 19960815
Implantable Lead Implant Date: 19960815
Implantable Lead Location: 753859
Implantable Lead Location: 753860
Implantable Lead Model: 5024
Implantable Pulse Generator Implant Date: 20090205
Lead Channel Impedance Value: 763 Ohm
Lead Channel Pacing Threshold Amplitude: 1.125 V
Lead Channel Pacing Threshold Pulse Width: 0.4 ms
Lead Channel Sensing Intrinsic Amplitude: 5.6 mV
Lead Channel Setting Pacing Amplitude: 2.25 V
Lead Channel Setting Pacing Pulse Width: 0.4 ms
Lead Channel Setting Sensing Sensitivity: 2.8 mV

## 2018-09-17 ENCOUNTER — Encounter: Payer: Self-pay | Admitting: Cardiology

## 2018-09-17 NOTE — Progress Notes (Signed)
Remote pacemaker transmission.   

## 2018-09-20 ENCOUNTER — Other Ambulatory Visit: Payer: Self-pay | Admitting: Family Medicine

## 2018-09-20 ENCOUNTER — Other Ambulatory Visit (HOSPITAL_COMMUNITY): Payer: Self-pay | Admitting: Physician Assistant

## 2018-09-28 ENCOUNTER — Telehealth (HOSPITAL_COMMUNITY): Payer: Self-pay | Admitting: *Deleted

## 2018-09-28 NOTE — Telephone Encounter (Signed)
Patient called in stating she went into AF yesterday morning about 3am. HR has been running in the 120s. She has been taking 30mg  of prn cardizem BP 116/66 HR 128 without relief. Discussed with Adline Peals, PA - will increase cardizem to 120mg  BID - can use cardizem 30mg  as needed if bp/hr allow between doses. Follow up Tuesday with report of how she is doing if still in AF will bring in. Pt in agreement.

## 2018-10-02 NOTE — Telephone Encounter (Signed)
Pt reports she went back into NSR Friday evening around 6pm. She continued BID dosing of 120mg  of cardizem until Sunday night when her BP was 100/57 so she went back to daily dosing. Pt will call if further issues.

## 2018-10-08 ENCOUNTER — Encounter: Payer: Self-pay | Admitting: Family Medicine

## 2018-10-08 ENCOUNTER — Ambulatory Visit (INDEPENDENT_AMBULATORY_CARE_PROVIDER_SITE_OTHER): Payer: Medicare Other | Admitting: Family Medicine

## 2018-10-08 ENCOUNTER — Other Ambulatory Visit: Payer: Self-pay

## 2018-10-08 ENCOUNTER — Ambulatory Visit (INDEPENDENT_AMBULATORY_CARE_PROVIDER_SITE_OTHER): Payer: Medicare Other

## 2018-10-08 VITALS — BP 122/68 | HR 73 | Temp 97.9°F | Resp 16 | Ht 68.0 in | Wt 196.8 lb

## 2018-10-08 DIAGNOSIS — M8580 Other specified disorders of bone density and structure, unspecified site: Secondary | ICD-10-CM

## 2018-10-08 DIAGNOSIS — R1012 Left upper quadrant pain: Secondary | ICD-10-CM

## 2018-10-08 DIAGNOSIS — E890 Postprocedural hypothyroidism: Secondary | ICD-10-CM | POA: Diagnosis not present

## 2018-10-08 DIAGNOSIS — I480 Paroxysmal atrial fibrillation: Secondary | ICD-10-CM | POA: Diagnosis not present

## 2018-10-08 DIAGNOSIS — Z7901 Long term (current) use of anticoagulants: Secondary | ICD-10-CM

## 2018-10-08 DIAGNOSIS — M810 Age-related osteoporosis without current pathological fracture: Secondary | ICD-10-CM

## 2018-10-08 DIAGNOSIS — Z23 Encounter for immunization: Secondary | ICD-10-CM | POA: Diagnosis not present

## 2018-10-08 DIAGNOSIS — Z8711 Personal history of peptic ulcer disease: Secondary | ICD-10-CM

## 2018-10-08 DIAGNOSIS — M1991 Primary osteoarthritis, unspecified site: Secondary | ICD-10-CM

## 2018-10-08 DIAGNOSIS — K219 Gastro-esophageal reflux disease without esophagitis: Secondary | ICD-10-CM

## 2018-10-08 DIAGNOSIS — Z780 Asymptomatic menopausal state: Secondary | ICD-10-CM

## 2018-10-08 LAB — LIPID PANEL
Cholesterol: 187 mg/dL (ref 0–200)
HDL: 73.5 mg/dL (ref >39.00)
LDL Cholesterol: 95 mg/dL (ref 0–99)
NonHDL: 113.45
Total CHOL/HDL Ratio: 3
Triglycerides: 93 mg/dL (ref 0.0–149.0)
VLDL: 18.6 mg/dL (ref 0.0–40.0)

## 2018-10-08 LAB — CBC WITH DIFFERENTIAL/PLATELET
Basophils Absolute: 0 10*3/uL (ref 0.0–0.1)
Basophils Relative: 1 % (ref 0.0–3.0)
Eosinophils Absolute: 0.1 10*3/uL (ref 0.0–0.7)
Eosinophils Relative: 1.2 % (ref 0.0–5.0)
HCT: 37.4 % (ref 36.0–46.0)
Hemoglobin: 12.4 g/dL (ref 12.0–15.0)
Lymphocytes Relative: 16.8 % (ref 12.0–46.0)
Lymphs Abs: 0.7 10*3/uL (ref 0.7–4.0)
MCHC: 33.2 g/dL (ref 30.0–36.0)
MCV: 93.1 fl (ref 78.0–100.0)
Monocytes Absolute: 0.3 10*3/uL (ref 0.1–1.0)
Monocytes Relative: 7.8 % (ref 3.0–12.0)
Neutro Abs: 3.2 10*3/uL (ref 1.4–7.7)
Neutrophils Relative %: 73.2 % (ref 43.0–77.0)
Platelets: 220 10*3/uL (ref 150.0–400.0)
RBC: 4.02 Mil/uL (ref 3.87–5.11)
RDW: 13.8 % (ref 11.5–15.5)
WBC: 4.3 10*3/uL (ref 4.0–10.5)

## 2018-10-08 LAB — TSH: TSH: 0.59 u[IU]/mL (ref 0.35–4.50)

## 2018-10-08 LAB — COMPREHENSIVE METABOLIC PANEL
ALT: 11 U/L (ref 0–35)
AST: 16 U/L (ref 0–37)
Albumin: 4.2 g/dL (ref 3.5–5.2)
Alkaline Phosphatase: 96 U/L (ref 39–117)
BUN: 16 mg/dL (ref 6–23)
CO2: 27 mEq/L (ref 19–32)
Calcium: 9.7 mg/dL (ref 8.4–10.5)
Chloride: 103 mEq/L (ref 96–112)
Creatinine, Ser: 0.8 mg/dL (ref 0.40–1.20)
GFR: 71.49 mL/min (ref >60.00)
Glucose, Bld: 87 mg/dL (ref 70–99)
Potassium: 4.8 mEq/L (ref 3.5–5.1)
Sodium: 139 mEq/L (ref 135–145)
Total Bilirubin: 0.6 mg/dL (ref 0.2–1.2)
Total Protein: 6.7 g/dL (ref 6.0–8.3)

## 2018-10-08 LAB — SEDIMENTATION RATE: Sed Rate: 22 mm/hr (ref 0–30)

## 2018-10-08 LAB — LIPASE: Lipase: 41 U/L (ref 11.0–59.0)

## 2018-10-08 MED ORDER — DICLOFENAC SODIUM 1 % TD GEL: 2.0000 g | Freq: Four times a day (QID) | TRANSDERMAL | 5 refills | Status: DC | PRN

## 2018-10-08 MED ORDER — TRAMADOL HCL 50 MG PO TABS: 50.0000 mg | ORAL_TABLET | Freq: Four times a day (QID) | ORAL | 0 refills | Status: DC | PRN

## 2018-10-08 NOTE — Patient Instructions (Signed)
Please follow up as scheduled for your next visit with me: 11/23/2018   If you have any questions or concerns, please don't hesitate to send me a message via MyChart or call the office at [phone]. Thank you for visiting with [place] today! It's our pleasure caring for you.  I will release your lab results to you on your MyChart account with further instructions. Please reply with any questions.   Stop the probiotic, calcium and magnesium supplements for now.   Today you were given your flu vaccination.

## 2018-10-08 NOTE — Progress Notes (Signed)
Subjective  CC:  Chief Complaint  Patient presents with  . Abdominal Pain    Reports that she has had an issue on/off for 3-4 months.. Reports left side dull pain.. Denies constipation, diarrhea, nausea, and vomiting    HPI: Kristin [name] is a 68 y.o. female who presents to the office today to address the problems listed above in the chief complaint.  68 year old female on Eliquis for PAF, history of hypertension, low thyroid presents due to several month history of intermittent left upper quadrant pain described as dull and aching.  She is not been able to establish any pattern to the pain.  Comes and goes out of the blue.  Not associated with food, no associated nausea, vomiting, diarrhea, constipation, belching, fevers, chills, urinary symptoms.  Pain is mild and annoying.  Never has taken medications for it.  Does not interfere with sleep.  Sometimes last a short time but other times will last the whole day.  Unrelated to movement or position changes.  Nontender abdomen today.  No symptoms today.  Colonoscopy is up-to-date.  No melena.  No history of bowel obstructions.  She does have her gallbladder.  She denies pleuritic chest pain.  She does take magnesium, probiotics and calcium she does not drink a lot of caffeine.  Hypertension follow-up: On medications, calcium channel blocker and well controlled.  No chest pain.  Intermittent PAF for which she takes extra diltiazem.  Osteoarthritis: Status post hip replacement doing well.  Still working with physical therapy.  She does have intermittent hand pain due to her osteoarthritis.  She is not able to take NSAIDs due to her Eliquis.  Would like some medicines to help with that.  GERD on chronic PPI which she feels is well controlled.  Has history of PUD.  Left upper quadrant pain feels different from her typical GERD symptoms.  No history of IBS  Health maintenance: She is scheduled for physical next month.  She is fasting today.  She is  due bone density and mammogram in October.  Flu shot today.  Wt Readings from Last 3 Encounters:  10/08/18 196 lb 12.8 oz (89.3 kg)  02/13/18 197 lb (89.4 kg)  10/20/17 197 lb 12.8 oz (89.7 kg)   BP Readings from Last 3 Encounters:  10/08/18 122/68  06/08/18 114/63  02/14/18 109/68    Assessment  1. LUQ abdominal pain   2. Hypothyroidism, postsurgical   3. Paroxysmal atrial fibrillation (HCC)   4. Long term current use of anticoagulant   5. Osteopenia after menopause   6. Primary osteoarthritis, unspecified site   7. History of peptic ulcer disease - nsaid   8. Gastroesophageal reflux disease, esophagitis presence not specified      Plan   Left upper quadrant pain: Unclear etiology.  Question related to bowel spasm, gas or other.  Check lab work, KUB, stop supplements and monitor for now since pain is mild.  May warrant ultrasound or further imaging studies if not improving.  Recheck lab work for hypothyroidism  Continue antihypertensives and calcium channel blocker for PAF with Eliquis.  No active bleeding  Health maintenance ordered DEXA along with mammogram in October  Start Voltaren gel for hand osteoarthritis.  She may use as needed tramadol if needed  Follow up: No follow-ups on file.  11/23/2018  Orders Placed This Encounter  Procedures  . DG Abd 1 View  . DG Bone Density  . CBC with Differential/Platelet  . Comprehensive metabolic panel  .  Lipid panel  . TSH  . Lipase  . Sedimentation rate   Meds ordered this encounter  Medications  . diclofenac sodium (VOLTAREN) 1 % GEL    Sig: Apply 2 g topically 4 (four) times daily as needed.    Dispense:  100 g    Refill:  5  . traMADol (ULTRAM) 50 MG tablet    Sig: Take 1 tablet (50 mg total) by mouth every 6 (six) hours as needed for moderate pain.    Dispense:  30 tablet    Refill:  0      I reviewed the patients updated PMH, FH, and SocHx.    Patient Active Problem List   Diagnosis Date Noted  .  Complete heart block ([place]) 10/06/2015    Priority: High  . Long term current use of anticoagulant 08/05/2015    Priority: High  . Pacemaker 10/14/2014    Priority: High  . Paroxysmal atrial fibrillation ([place]) 10/14/2014    Priority: High  . Hypothyroidism, postsurgical 01/08/2013    Priority: High  . OSA (obstructive sleep apnea) 10/20/2017    Priority: Medium    Class: Chronic  . Osteopenia after menopause 06/14/2017    Priority: Medium  . Osteoarthritis of left hip 05/11/2017    Priority: Medium  . GERD (gastroesophageal reflux disease) 01/08/2013    Priority: Medium  . OA (osteoarthritis) 01/08/2013    Priority: Medium  . History of peptic ulcer disease - nsaid 01/08/2013    Priority: Medium  . Diverticulosis of large intestine without hemorrhage 01/04/2016    Priority: Low  . Internal hemorrhoids without complication [id number]    Priority: Low  . AR (allergic rhinitis) 01/08/2013    Priority: Low  . Overweight (BMI 25.0-29.9) 02/14/2018  . S/P left THA, AA 02/13/2018   Current Meds  Medication Sig  . Calcium Carb-Cholecalciferol (CALCIUM/VITAMIN D) 600-400 MG-UNIT TABS Take 1 tablet by mouth daily.   . Cyanocobalamin (B-12) 1000 MCG SUBL Place 1 mL under the tongue 2 (two) times a week.  . diltiazem (CARDIZEM CD) 120 MG 24 hr capsule TAKE 1 CAPSULE DAILY.  Marland Kitchen ELIQUIS 5 MG TABS tablet TAKE 1 TABLET BY MOUTH TWICE DAILY.  . fexofenadine (ALLEGRA) 180 MG tablet Take 180 mg by mouth daily.  Marland Kitchen levothyroxine (SYNTHROID, LEVOTHROID) 125 MCG tablet Take 1 tablet (125 mcg total) by mouth daily.  . Magnesium 250 MG TABS Take 250 mg by mouth daily.  Marland Kitchen omeprazole (PRILOSEC) 20 MG capsule TAKE (1) CAPSULE DAILY.  Marland Kitchen Potassium 99 MG TABS Take 1 tablet by mouth daily.  . Probiotic Product (PROBIOTIC DAILY PO) Take 1 capsule by mouth daily.  . sodium chloride (OCEAN) 0.65 % SOLN nasal spray Place 1 spray into both nostrils as needed for congestion.  . vitamin C (ASCORBIC ACID) 500 MG  tablet Take 500 mg by mouth daily.  . [DISCONTINUED] docusate sodium (COLACE) 100 MG capsule Take 1 capsule (100 mg total) by mouth 2 (two) times daily.    Allergies: Patient is allergic to morphine and related; neomycin; and adhesive [tape]. Family History: Patient family history includes Arthritis in her mother; COPD in her brother; Cancer in her maternal grandmother; Diabetes Mellitus II in her maternal uncle; Drug abuse in her brother. Social History:  Patient  reports that she has never smoked. She has never used smokeless tobacco. She reports current alcohol use of about 1.0 standard drinks of alcohol per week. She reports that she does not use drugs.  Review of Systems:  Constitutional: Negative for fever malaise or anorexia Cardiovascular: negative for chest pain Respiratory: negative for SOB or persistent cough Gastrointestinal: negative for abdominal pain  Objective  Vitals: BP 122/68   Pulse 73   Temp 97.9 F (36.6 C) (Tympanic)   Resp 16   Ht 5\' 8"  (1.727 m)   Wt 196 lb 12.8 oz (89.3 kg)   SpO2 97%   BMI 29.92 kg/m  General: no acute distress , A&Ox3 HEENT: PEERL, conjunctiva normal, Oropharynx moist,neck is supple Cardiovascular:  RRR without murmur or gallop.  Respiratory:  Good breath sounds bilaterally, CTAB with normal respiratory effort Gastrointestinal: soft, flat abdomen, normal active bowel sounds, no palpable masses, nontender throughout, no hepatosplenomegaly, no appreciated hernias Skin:  Warm, no rashes     Commons side effects, risks, benefits, and alternatives for medications and treatment plan prescribed today were discussed, and the patient expressed understanding of the given instructions. Patient is instructed to call or message via MyChart if he/she has any questions or concerns regarding our treatment plan. No barriers to understanding were identified. We discussed Red Flag symptoms and signs in detail. Patient expressed understanding regarding  what to do in case of urgent or emergency type symptoms.   Medication list was reconciled, printed and provided to the patient in AVS. Patient instructions and summary information was reviewed with the patient as documented in the AVS. This note was prepared with assistance of Dragon voice recognition software. Occasional wrong-word or sound-a-like substitutions may have occurred due to the inherent limitations of voice recognition software

## 2018-10-10 ENCOUNTER — Ambulatory Visit: Payer: BC Managed Care – PPO

## 2018-10-10 ENCOUNTER — Encounter: Payer: BC Managed Care – PPO | Admitting: Family Medicine

## 2018-10-24 ENCOUNTER — Encounter: Payer: BC Managed Care – PPO | Admitting: Nurse Practitioner

## 2018-10-29 ENCOUNTER — Other Ambulatory Visit: Payer: Self-pay

## 2018-10-29 ENCOUNTER — Ambulatory Visit
Admission: RE | Admit: 2018-10-29 | Discharge: 2018-10-29 | Disposition: A | Discharge status: Home / Self Care | Payer: Medicare Other | Source: Ambulatory Visit | Attending: Family Medicine | Admitting: Family Medicine

## 2018-10-29 DIAGNOSIS — Z780 Asymptomatic menopausal state: Secondary | ICD-10-CM

## 2018-10-29 DIAGNOSIS — M8580 Other specified disorders of bone density and structure, unspecified site: Secondary | ICD-10-CM

## 2018-11-02 ENCOUNTER — Other Ambulatory Visit: Payer: Self-pay

## 2018-11-02 ENCOUNTER — Ambulatory Visit (INDEPENDENT_AMBULATORY_CARE_PROVIDER_SITE_OTHER): Payer: Medicare Other | Admitting: Family Medicine

## 2018-11-02 ENCOUNTER — Encounter: Payer: Self-pay | Admitting: Family Medicine

## 2018-11-02 VITALS — BP 101/70 | HR 78 | Temp 97.9°F | Ht 68.0 in | Wt 197.6 lb

## 2018-11-02 DIAGNOSIS — Z7901 Long term (current) use of anticoagulants: Secondary | ICD-10-CM

## 2018-11-02 DIAGNOSIS — I480 Paroxysmal atrial fibrillation: Secondary | ICD-10-CM

## 2018-11-02 DIAGNOSIS — E890 Postprocedural hypothyroidism: Secondary | ICD-10-CM

## 2018-11-02 DIAGNOSIS — Z780 Asymptomatic menopausal state: Secondary | ICD-10-CM

## 2018-11-02 DIAGNOSIS — Z0000 Encounter for general adult medical examination without abnormal findings: Secondary | ICD-10-CM

## 2018-11-02 DIAGNOSIS — G4733 Obstructive sleep apnea (adult) (pediatric): Secondary | ICD-10-CM

## 2018-11-02 DIAGNOSIS — M8580 Other specified disorders of bone density and structure, unspecified site: Secondary | ICD-10-CM

## 2018-11-02 DIAGNOSIS — R1012 Left upper quadrant pain: Secondary | ICD-10-CM

## 2018-11-02 NOTE — Progress Notes (Signed)
Subjective  Chief Complaint  Patient presents with  . Annual Exam    HPI: Kristin [name] is a 68 y.o. female who presents to [facility] today for a Female Wellness Visit. She also has the concerns and/or needs as listed above in the chief complaint. These will be addressed in addition to the Health Maintenance Visit.   Wellness Visit: annual visit with health maintenance review and exam without Pap   HM: up to date. Osteopenia on recent dexa. mammo and colon ca [facility] UTD. Feels well. Losing weight. Doing well.  Chronic disease f/u and/or acute problem visit: (deemed necessary to be done in addition to the wellness visit):  S/p hip replacement x 2: first hip has been recalled due to possible cobalt and chromium release in to system. Her levels have been monitored and are up some. She is contemplating getting the hip redone.   PAF and h/o heart block w/ pacer: stable. No sxs of RVH or CP or palpitations or syncope  No bleeds on eloquis; chronic gerd with ppi - well controlled.   LUQ abdominal pain: see last note for complete h/o: still persistent. Most days with ache in LUQ w/o relationship to meals, BMS and no improvement since stopping ca, mag and probiotics. Pain can be sharp and moderate. No radiation. H/o diverticular disease.   Low thyroid is controlled.   Other chronic problems are controlled.   Assessment  1. Annual physical exam   2. Paroxysmal atrial fibrillation (HCC)   3. Long term current use of anticoagulant   4. Hypothyroidism, postsurgical   5. OSA (obstructive sleep apnea)   6. Osteopenia after menopause      Plan  Female Wellness Visit:  Age appropriate Health Maintenance and Prevention measures were discussed with patient. Included topics are cancer screening recommendations, ways to keep healthy (see AVS) including dietary and exercise recommendations, regular eye and dental care, use of seat belts, and avoidance of moderate alcohol  use and tobacco use.   BMI: discussed patient's BMI and encouraged positive lifestyle modifications to help get to or maintain a target BMI.  HM needs and immunizations were addressed and ordered. See below for orders. See HM and immunization section for updates.  Routine labs and screening tests ordered including cmp, cbc and lipids where appropriate.  Discussed recommendations regarding Vit D and calcium supplementation (see AVS)  Chronic disease management visit and/or acute problem visit:  LUQ abdominal pain: ? Etiology. ? Diverticulitis: check Abd CT. Labs were ok. See below  Well controlled chronic medical problems. No change in meds. All labs look good.   Hip replacement: to f/u with ortho and cards to get their opinions.  Follow up: Return in about 1 year (around 11/02/2019) for complete physical.  No orders of the defined types were placed in this encounter.  No orders of the defined types were placed in this encounter.     Lifestyle: Body mass index is 30.04 kg/m. Wt Readings from Last 3 Encounters:  11/02/18 197 lb 9.6 oz (89.6 kg)  10/08/18 196 lb 12.8 oz (89.3 kg)  02/13/18 197 lb (89.4 kg)   D  Patient Active Problem List   Diagnosis Date Noted  . Complete heart block ([place]) 10/06/2015    Priority: High    Overview:  s/p pacemaker   . Long term current use of anticoagulant 08/05/2015    Priority: High  . Pacemaker 10/14/2014    Priority: High  . Paroxysmal atrial  fibrillation ([place]) 10/14/2014    Priority: High  . Hypothyroidism, postsurgical 01/08/2013    Priority: High  . OSA (obstructive sleep apnea) 10/20/2017    Priority: Medium    Class: Chronic  . Osteopenia after menopause 06/14/2017    Priority: Medium    DEXA 10/2018 [facility]: T = -1.4 lowest, stable osteopenia, recheck 2-3 years.  DEXA 08/2016 [facility]: T = -1.10 radius, -1.0 femur; lumbar spine elevated due to DJD; recheck 2-3 years.   . Osteoarthritis of left hip 05/11/2017    Priority:  Medium  . GERD (gastroesophageal reflux disease) 01/08/2013    Priority: Medium    Overview:  H/o esophageal stricture from nsaids s/p endoscopy 2007, Dr. Penelope Coop   . OA (osteoarthritis) 01/08/2013    Priority: Medium  . History of peptic ulcer disease - nsaid 01/08/2013    Priority: Medium    Overview:  Due to Nsaids   . Diverticulosis of large intestine without hemorrhage 01/04/2016    Priority: Low  . Internal hemorrhoids without complication [id number]    Priority: Low  . AR (allergic rhinitis) 01/08/2013    Priority: Low  . Overweight (BMI 25.0-29.9) 02/14/2018  . S/P left THA, AA 02/13/2018   Health Maintenance  Topic Date Due  . MAMMOGRAM  09/21/2018  . DEXA SCAN  10/28/2020  . TETANUS/TDAP  07/02/2023  . COLONOSCOPY  12/28/2025  . INFLUENZA VACCINE  Completed  . Hepatitis C Screening  Completed  . PNA vac Low Risk Adult  Completed   Immunization History  Administered Date(s) Administered  . Fluad Quad(high Dose 65+) 10/08/2018  . Hepatitis B, ped/adol 11/24/2009  . Influenza, High Dose Seasonal PF 11/01/2016, 10/20/2017  . Influenza, Quadrivalent, Recombinant, Inj, Pf 01/08/2013  . Influenza, Seasonal, Injecte, Preservative Fre 11/11/2014  . Influenza,inj,Quad PF,6+ Mos 12/24/2015  . Pneumococcal Conjugate-13 08/17/2016  . Pneumococcal Polysaccharide-23 10/20/2017  . Tdap 09/07/2010, 09/07/2010, 07/01/2013  . Zoster 01/08/2013  . Zoster Recombinat (Shingrix) 07/26/2017, 11/20/2017   We updated and reviewed the patient's past history in detail and it is documented below. Allergies: Patient is allergic to morphine and related; neomycin; and adhesive [tape]. Past Medical History Patient  has a past medical history of AR (allergic rhinitis), Atrial fibrillation ([place]), GERD (gastroesophageal reflux disease), Hiatal hernia, Hiatal hernia, Migraines, Mild sleep apnea, Osteoarthritis, Osteoarthritis, Osteopenia after menopause (06/14/2017), Pacemaker, Paroxysmal A-fib  ([place]), PUD (peptic ulcer disease), Thyroid disease, and Transient complete heart block (1996). Past Surgical History Patient  has a past surgical history that includes Thyroidectomy, partial (1986); Tubal ligation (1987); Pacemaker insertion (09/08/1994); Pacemaker generator change (03/01/2007); Hip Arthroplasty (Right, 2008); and Total hip arthroplasty (Left, 02/13/2018). Family History: Patient family history includes Arthritis in her mother; COPD in her brother; Cancer in her maternal grandmother; Diabetes Mellitus II in her maternal uncle; Drug abuse in her brother. Social History:  Patient  reports that she has never smoked. She has never used smokeless tobacco. She reports current alcohol use of about 1.0 standard drinks of alcohol per week. She reports that she does not use drugs.  Review of Systems: Constitutional: negative for fever or malaise Ophthalmic: negative for photophobia, double vision or loss of vision Cardiovascular: negative for chest pain, dyspnea on exertion, or new LE swelling Respiratory: negative for SOB or persistent cough Gastrointestinal: POSITIVE for abdominal pain, no change in bowel habits or melena Genitourinary: negative for dysuria or gross hematuria, no abnormal uterine bleeding or disharge Musculoskeletal: negative for new gait disturbance or muscular weakness Integumentary: negative for new or  persistent rashes, no breast lumps Neurological: negative for TIA or stroke symptoms Psychiatric: negative for SI or delusions Allergic/Immunologic: negative for hives  Patient Care Team    Relationship Specialty Notifications Start End  Leamon Arnt, MD PCP - General Family Medicine  06/14/17   Melvenia Beam, MD Consulting Physician Neurology  06/14/17   Paralee Cancel, MD Consulting Physician Orthopedic Surgery  06/14/17   [name], MD Consulting Physician Cardiology  06/14/17   Efrain Sella, MD Consulting Physician Gastroenterology  06/14/17      Objective  Vitals: BP 101/70 (BP Location: Left Arm, Patient Position: Sitting, Cuff Size: Normal)   Pulse 78   Temp 97.9 F (36.6 C) (Temporal)   Ht 5\' 8"  (1.727 m)   Wt 197 lb 9.6 oz (89.6 kg)   SpO2 97%   BMI 30.04 kg/m  General:  Well developed, well nourished, no acute distress  Psych:  Alert and orientedx3,normal mood and affect HEENT:  Normocephalic, atraumatic, non-icteric sclera, PERRL, oropharynx is clear without mass or exudate, supple neck without adenopathy, mass or thyromegaly Cardiovascular:  Normal S1, S2, RRR without gallop, rub or murmur, nondisplaced PMI Respiratory:  Good breath sounds bilaterally, CTAB with normal respiratory effort Gastrointestinal: normal bowel sounds, soft, non-tender, no noted masses. No HSM MSK: no deformities, contusions. Joints are without erythema or swelling. Spine and CVA region are nontender Skin:  Warm, no rashes or suspicious lesions noted Neurologic:    Mental status is normal. CN 2-11 are normal. Gross motor and sensory exams are normal. Normal gait. No tremor Breast Exam: No mass, skin retraction or nipple discharge is appreciated in either breast. No axillary adenopathy. Fibrocystic changes are not noted  No visits with results within 1 Day(s) from this visit.  Latest known visit with results is:  Office Visit on 10/08/2018  Component Date Value Ref Range Status  . WBC 10/08/2018 4.3  4.0 - 10.5 K/uL Final  . RBC 10/08/2018 4.02  3.87 - 5.11 Mil/uL Final  . Hemoglobin 10/08/2018 12.4  12.0 - 15.0 g/dL Final  . HCT 10/08/2018 37.4  36.0 - 46.0 % Final  . MCV 10/08/2018 93.1  78.0 - 100.0 fl Final  . MCHC 10/08/2018 33.2  30.0 - 36.0 g/dL Final  . RDW 10/08/2018 13.8  11.5 - 15.5 % Final  . Platelets 10/08/2018 220.0  150.0 - 400.0 K/uL Final  . Neutrophils Relative % 10/08/2018 73.2  43.0 - 77.0 % Final  . Lymphocytes Relative 10/08/2018 16.8  12.0 - 46.0 % Final  . Monocytes Relative 10/08/2018 7.8  3.0 - 12.0 % Final  .  Eosinophils Relative 10/08/2018 1.2  0.0 - 5.0 % Final  . Basophils Relative 10/08/2018 1.0  0.0 - 3.0 % Final  . Neutro Abs 10/08/2018 3.2  1.4 - 7.7 K/uL Final  . Lymphs Abs 10/08/2018 0.7  0.7 - 4.0 K/uL Final  . Monocytes Absolute 10/08/2018 0.3  0.1 - 1.0 K/uL Final  . Eosinophils Absolute 10/08/2018 0.1  0.0 - 0.7 K/uL Final  . Basophils Absolute 10/08/2018 0.0  0.0 - 0.1 K/uL Final  . Sodium 10/08/2018 139  135 - 145 mEq/L Final  . Potassium 10/08/2018 4.8  3.5 - 5.1 mEq/L Final  . Chloride 10/08/2018 103  96 - 112 mEq/L Final  . CO2 10/08/2018 27  19 - 32 mEq/L Final  . Glucose, Bld 10/08/2018 87  70 - 99 mg/dL Final  . BUN 10/08/2018 16  6 - 23 mg/dL Final  .  Creatinine, Ser 10/08/2018 0.80  0.40 - 1.20 mg/dL Final  . Total Bilirubin 10/08/2018 0.6  0.2 - 1.2 mg/dL Final  . Alkaline Phosphatase 10/08/2018 96  39 - 117 U/L Final  . AST 10/08/2018 16  0 - 37 U/L Final  . ALT 10/08/2018 11  0 - 35 U/L Final  . Total Protein 10/08/2018 6.7  6.0 - 8.3 g/dL Final  . Albumin 10/08/2018 4.2  3.5 - 5.2 g/dL Final  . Calcium 10/08/2018 9.7  8.4 - 10.5 mg/dL Final  . GFR 10/08/2018 71.49  >60.00 mL/min Final  . Cholesterol 10/08/2018 187  0 - 200 mg/dL Final  . Triglycerides 10/08/2018 93.0  0.0 - 149.0 mg/dL Final  . HDL 10/08/2018 73.50  >39.00 mg/dL Final  . VLDL 10/08/2018 18.6  0.0 - 40.0 mg/dL Final  . LDL Cholesterol 10/08/2018 95  0 - 99 mg/dL Final  . Total CHOL/HDL Ratio 10/08/2018 3   Final  . NonHDL 10/08/2018 113.45   Final  . TSH 10/08/2018 0.59  0.35 - 4.50 uIU/mL Final  . Lipase 10/08/2018 41.0  11.0 - 59.0 U/L Final  . Sed Rate 10/08/2018 22  0 - 30 mm/hr Final     Commons side effects, risks, benefits, and alternatives for medications and treatment plan prescribed today were discussed, and the patient expressed understanding of the given instructions. Patient is instructed to call or message via MyChart if he/she has any questions or concerns regarding our treatment  plan. No barriers to understanding were identified. We discussed Red Flag symptoms and signs in detail. Patient expressed understanding regarding what to do in case of urgent or emergency type symptoms.   Medication list was reconciled, printed and provided to the patient in AVS. Patient instructions and summary information was reviewed with the patient as documented in the AVS. This note was prepared with assistance of Dragon voice recognition software. Occasional wrong-word or sound-a-like substitutions may have occurred due to the inherent limitations of voice recognition software

## 2018-11-02 NOTE — Patient Instructions (Addendum)
Please return in 12 months for your annual complete physical; please come fasting.  We will call you with information regarding your referral appointment. Abdominal CT scan. If you do not hear from Kristin [name] within the next 2 weeks, please let me know.  If you have any questions or concerns, please don't hesitate to send me a message via MyChart or call the office at [phone]. Thank you for visiting with Kristin [name] today! It's our pleasure caring for you.   Preventive Care 67 Years and Older, Female Preventive care refers to lifestyle choices and visits with your health care provider that can promote health and wellness. This includes:  A yearly physical exam. This is also called an annual well check.  Regular dental and eye exams.  Immunizations.  Screening for certain conditions.  Healthy lifestyle choices, such as diet and exercise. What can I expect for my preventive care visit? Physical exam Your health care provider will check:  Height and weight. These may be used to calculate body mass index (BMI), which is a measurement that tells if you are at a healthy weight.  Heart rate and blood pressure.  Your skin for abnormal spots. Counseling Your health care provider may ask you questions about:  Alcohol, tobacco, and drug use.  Emotional well-being.  Home and relationship well-being.  Sexual activity.  Eating habits.  History of falls.  Memory and ability to understand (cognition).  Work and work Statistician.  Pregnancy and menstrual history. What immunizations do I need?  Influenza (flu) vaccine  This is recommended every year. Tetanus, diphtheria, and pertussis (Tdap) vaccine  You may need a Td booster every 10 years. Varicella (chickenpox) vaccine  You may need this vaccine if you have not already been vaccinated. Zoster (shingles) vaccine  You may need this after age 34. Pneumococcal conjugate (PCV13) vaccine  One dose is recommended after age 89.  Pneumococcal polysaccharide (PPSV23) vaccine  One dose is recommended after age 77. Measles, mumps, and rubella (MMR) vaccine  You may need at least one dose of MMR if you were born in 1957 or later. You may also need a second dose. Meningococcal conjugate (MenACWY) vaccine  You may need this if you have certain conditions. Hepatitis A vaccine  You may need this if you have certain conditions or if you travel or work in places where you may be exposed to hepatitis A. Hepatitis B vaccine  You may need this if you have certain conditions or if you travel or work in places where you may be exposed to hepatitis B. Haemophilus influenzae type b (Hib) vaccine  You may need this if you have certain conditions. You may receive vaccines as individual doses or as more than one vaccine together in one shot (combination vaccines). Talk with your health care provider about the risks and benefits of combination vaccines. What tests do I need? Blood tests  Lipid and cholesterol levels. These may be checked every 5 years, or more frequently depending on your overall health.  Hepatitis C test.  Hepatitis B test. Screening  Lung cancer screening. You may have this screening every year starting at age 63 if you have a 30-pack-year history of smoking and currently smoke or have quit within the past 15 years.  Colorectal cancer screening. All adults should have this screening starting at age 84 and continuing until age 57. Your health care provider may recommend screening at age 53 if you are at increased risk. You will have tests every 1-10 years, depending  on your results and the type of screening test.  Diabetes screening. This is done by checking your blood sugar (glucose) after you have not eaten for a while (fasting). You may have this done every 1-3 years.  Mammogram. This may be done every 1-2 years. Talk with your health care provider about how often you should have regular mammograms.   BRCA-related cancer screening. This may be done if you have a family history of breast, ovarian, tubal, or peritoneal cancers. Other tests  Sexually transmitted disease (STD) testing.  Bone density scan. This is done to screen for osteoporosis. You may have this done starting at age 3. Follow these instructions at home: Eating and drinking  Eat a diet that includes fresh fruits and vegetables, whole grains, lean protein, and low-fat dairy products. Limit your intake of foods with high amounts of sugar, saturated fats, and salt.  Take vitamin and mineral supplements as recommended by your health care provider.  Do not drink alcohol if your health care provider tells you not to drink.  If you drink alcohol: ? Limit how much you have to 0-1 drink a day. ? Be aware of how much alcohol is in your drink. In the [place] one drink equals one 12 oz bottle of beer (355 mL), one 5 oz glass of wine (148 mL), or one 1 oz glass of hard liquor (44 mL). Lifestyle  Take daily care of your teeth and gums.  Stay active. Exercise for at least 30 minutes on 5 or more days each week.  Do not use any products that contain nicotine or tobacco, such as cigarettes, e-cigarettes, and chewing tobacco. If you need help quitting, ask your health care provider.  If you are sexually active, practice safe sex. Use a condom or other form of protection in order to prevent STIs (sexually transmitted infections).  Talk with your health care provider about taking a low-dose aspirin or statin. What's next?  Go to your health care provider once a year for a well check visit.  Ask your health care provider how often you should have your eyes and teeth checked.  Stay up to date on all vaccines. This information is not intended to replace advice given to you by your health care provider. Make sure you discuss any questions you have with your health care provider. Document Released: 02/06/2015 Document Revised: 01/04/2018  Document Reviewed: 01/04/2018 Elsevier Patient Education  2020 [employer].

## 2018-11-05 ENCOUNTER — Telehealth: Payer: Self-pay | Admitting: Family Medicine

## 2018-11-05 ENCOUNTER — Other Ambulatory Visit: Payer: Self-pay | Admitting: Family Medicine

## 2018-11-05 DIAGNOSIS — R1012 Left upper quadrant pain: Secondary | ICD-10-CM

## 2018-11-05 MED ORDER — ERYTHROMYCIN 5 MG/GM OP OINT: 1.0000 "application " | TOPICAL_OINTMENT | Freq: Three times a day (TID) | OPHTHALMIC | 0 refills | Status: DC

## 2018-11-05 NOTE — Telephone Encounter (Signed)
See note  Copied from [place] [id number]. Topic: Quick Communication - Rx Refill/Question >> Nov 05, 2018  8:59 AM Alease Frame wrote: [place] appt on [address] she states doc was suppose to prescribe her medicine for her eyes but forgot and would like a prescription . Please advise

## 2018-11-05 NOTE — Addendum Note (Signed)
Addended by: Billey Chang on: 11/05/2018 09:27 AM   Modules accepted: Orders

## 2018-11-05 NOTE — Telephone Encounter (Signed)
RX for eyes was not sent, please advise

## 2018-11-06 NOTE — Progress Notes (Signed)
Electrophysiology Office Note Date: 11/08/2018  ID:  Kristin [name], DOB 14-Nov-1950, MRN [id number]  PCP: Leamon Arnt, MD Electrophysiologist: Rayann Heman  CC: Pacemaker follow-up  Kristin [name] is a 68 y.o. female seen today for Dr Rayann Heman.  She presents today for routine electrophysiology followup.  Since last being seen in our clinic, the patient reports doing very well. She has retired and is working to sell her second home at [employer]. She has had some increase in AF burden and was seen in the AF clinic. She has been started on long acting diltiazem with improvement. She also has a prior hip replacement on recall and asks about having an MRI with her pacemaker.  She denies chest pain, dyspnea, PND, orthopnea, nausea, vomiting, dizziness, syncope, edema, weight gain, or early satiety.  Device History: MDT dual chamber PPM implanted 1996 for transient complete heart block; gen change 2009   Past Medical History:  Diagnosis Date  . AR (allergic rhinitis)   . Atrial fibrillation ([place])   . GERD (gastroesophageal reflux disease)   . Hiatal hernia   . Hiatal hernia   . Migraines   . Mild sleep apnea   . Osteoarthritis   . Osteoarthritis   . Osteopenia after menopause 06/14/2017   DEXA 08/2016 [facility]: T = -1.10 radius, -1.0 femur; lumbar spine elevated due to DJD; recheck 2-3 years.  . Pacemaker    CHB  . Paroxysmal A-fib ([place])   . PUD (peptic ulcer disease)   . Thyroid disease   . Transient complete heart block 1996   Past Surgical History:  Procedure Laterality Date  . HIP ARTHROPLASTY Right 2008  . PACEMAKER GENERATOR CHANGE  03/01/2007   performed [facility] Dr Rosita Fire at [facility])  . PACEMAKER INSERTION  09/08/1994   performed in [facility] for transient complete heart block  . THYROIDECTOMY, PARTIAL  1986   BENIGN NODULES  . TOTAL HIP ARTHROPLASTY Left 02/13/2018   Procedure: TOTAL HIP ARTHROPLASTY ANTERIOR APPROACH;  Surgeon: Paralee Cancel, MD;   Location: [facility];  Service: Orthopedics;  Laterality: Left;  70 mins  . TUBAL LIGATION  1987    Current Outpatient Medications  Medication Sig Dispense Refill  . amoxicillin (AMOXIL) 500 MG capsule Take 2,000 mg by mouth See admin instructions. Before Dental Visits    . Calcium Carb-Cholecalciferol (CALCIUM/VITAMIN D) 600-400 MG-UNIT TABS Take 1 tablet by mouth daily.     . Cyanocobalamin (B-12) 1000 MCG SUBL Place 1 mL under the tongue 2 (two) times a week.    . diclofenac sodium (VOLTAREN) 1 % GEL Apply 2 g topically 4 (four) times daily as needed. 100 g 5  . diltiazem (CARDIZEM CD) 120 MG 24 hr capsule TAKE 1 CAPSULE DAILY. 90 capsule 2  . diltiazem (CARDIZEM) 30 MG tablet Take 1 tablet every 4 hours AS NEEDED for AFIB heart rate >100 45 tablet 3  . ELIQUIS 5 MG TABS tablet TAKE 1 TABLET BY MOUTH TWICE DAILY. 180 tablet 1  . erythromycin ophthalmic ointment Place 1 application into the left eye 3 (three) times daily. 3.5 g 0  . fexofenadine (ALLEGRA) 180 MG tablet Take 180 mg by mouth daily.    Marland Kitchen levothyroxine (SYNTHROID, LEVOTHROID) 125 MCG tablet Take 1 tablet (125 mcg total) by mouth daily. 90 tablet 3  . Magnesium 250 MG TABS Take 250 mg by mouth daily.    Marland Kitchen omeprazole (PRILOSEC) 20 MG capsule TAKE (1) CAPSULE DAILY. 90 capsule 3  .  Potassium 99 MG TABS Take 1 tablet by mouth daily.    . sodium chloride (OCEAN) 0.65 % SOLN nasal spray Place 1 spray into both nostrils as needed for congestion.    . traMADol (ULTRAM) 50 MG tablet Take 1 tablet (50 mg total) by mouth every 6 (six) hours as needed for moderate pain. 30 tablet 0  . vitamin C (ASCORBIC ACID) 500 MG tablet Take 500 mg by mouth daily.     No current facility-administered medications for this visit.     Allergies:   Morphine and related, Neomycin, and Adhesive [tape]   Social History: Social History   Socioeconomic History  . Marital status: Married    Spouse name: Not on file  . Number of children: Not on file  .  Years of education: Not on file  . Highest education level: Not on file  Occupational History  . Not on file  Social Needs  . Financial resource strain: Not on file  . Food insecurity    Worry: Not on file    Inability: Not on file  . Transportation needs    Medical: Not on file    Non-medical: Not on file  Tobacco Use  . Smoking status: Never Smoker  . Smokeless tobacco: Never Used  Substance and Sexual Activity  . Alcohol use: Yes    Alcohol/week: 1.0 standard drinks    Types: 1 Glasses of wine per week    Comment: occasionally  . Drug use: No  . Sexual activity: Not Currently  Lifestyle  . Physical activity    Days per week: Not on file    Minutes per session: Not on file  . Stress: Not on file  Relationships  . Social Herbalist on phone: Not on file    Gets together: Not on file    Attends religious service: Not on file    Active member of club or organization: Not on file    Attends meetings of clubs or organizations: Not on file    Relationship status: Not on file  . Intimate partner violence    Fear of current or ex partner: Not on file    Emotionally abused: Not on file    Physically abused: Not on file    Forced sexual activity: Not on file  Other Topics Concern  . 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 Diabetes Mellitus II Maternal Uncle   . Cancer Maternal Grandmother        ENDOMETRIAL  . Arthritis Mother   . COPD Brother   . Drug abuse Brother   . Breast cancer Neg Hx      Review of Systems: All other systems reviewed and are otherwise negative except as noted above.   Physical Exam: VS:  BP 120/76   Pulse 92   Ht 5\' 8"  (1.727 m)   Wt 200 lb (90.7 kg)   SpO2 98%   BMI 30.41 kg/m  , BMI Body mass index is 30.41 kg/m.  GEN- The patient is well appearing, alert and oriented x 3  today.   HEENT: normocephalic, atraumatic; sclera clear, conjunctiva pink; hearing intact; oropharynx clear; neck supple  Lungs- Clear to ausculation bilaterally, normal work of breathing.  No wheezes, rales, rhonchi Heart- Regular rate and rhythm  GI- soft, non-tender,  non-distended, bowel sounds present  Extremities- no clubbing, cyanosis, or edema  MS- no significant deformity or atrophy Skin- warm and dry, no rash or lesion; PPM pocket well healed Psych- euthymic mood, full affect Neuro- strength and sensation are intact  PPM Interrogation- reviewed in detail today,  See PACEART report  EKG:  EKG is not ordered today.  Recent Labs: 10/08/2018: ALT 11; BUN 16; Creatinine, Ser 0.80; Hemoglobin 12.4; Platelets 220.0; Potassium 4.8; Sodium 139; TSH 0.59   Wt Readings from Last 3 Encounters:  11/08/18 200 lb (90.7 kg)  11/02/18 197 lb 9.6 oz (89.6 kg)  10/08/18 196 lb 12.8 oz (89.3 kg)     Other studies Reviewed: Additional studies/ records that were reviewed today include: AF clinic notes  Assessment and Plan:  1.  Remote transient complete heart block Normal PPM function See Pace Art report No changes today She rarely V paces and is programmed VVI 35. She would like to consider gen change when she reaches ERI Her pacemaker is not MRI compatible, but she rarely V paces. We discussed potentially having MRI scan done at [facility] where they have experience scanning these patients if the information would be helpful to make a decision regarding hip replacement.   2.  Paroxysmal atrial fibrillation Continue Eliquis for CHADS2VASC of 2 (pt preference) Continue Diltiazem We discussed treatment options today for increase in AF burden including AAD therapy and ablation. She has scheduled follow up with AF clinic    Current medicines are reviewed at length with the patient today.   The patient does not have concerns regarding her medicines.  The following changes were made today:  none   Labs/ tests ordered today include: none No orders of the defined types were placed in this encounter.    Disposition:   Follow up with [facility], Dr Rayann Heman in 1 year    Signed, Chanetta Marshall, NP 11/08/2018 8:46 AM  [place] [address] [phone] (office) [phone] (fax)

## 2018-11-08 ENCOUNTER — Other Ambulatory Visit: Payer: Self-pay

## 2018-11-08 ENCOUNTER — Encounter: Payer: Self-pay | Admitting: Nurse Practitioner

## 2018-11-08 ENCOUNTER — Ambulatory Visit: Payer: Medicare Other | Admitting: Nurse Practitioner

## 2018-11-08 VITALS — BP 120/76 | HR 92 | Ht 68.0 in | Wt 200.0 lb

## 2018-11-08 DIAGNOSIS — I442 Atrioventricular block, complete: Secondary | ICD-10-CM

## 2018-11-08 DIAGNOSIS — I480 Paroxysmal atrial fibrillation: Secondary | ICD-10-CM | POA: Diagnosis not present

## 2018-11-08 LAB — CUP PACEART INCLINIC DEVICE CHECK
Date Time Interrogation Session: 20201015084614
Implantable Lead Implant Date: 19960815
Implantable Lead Implant Date: 19960815
Implantable Lead Location: 753859
Implantable Lead Location: 753860
Implantable Lead Model: 5024
Implantable Pulse Generator Implant Date: 20090205

## 2018-11-08 NOTE — Patient Instructions (Addendum)
Medication Instructions:  none *If you need a refill on your cardiac medications before your next appointment, please call your pharmacy*  Lab Work: none If you have labs (blood work) drawn today and your tests are completely normal, you will receive your results only by: Marland Kitchen MyChart Message (if you have MyChart) OR . A paper copy in the mail If you have any lab test that is abnormal or we need to change your treatment, we will call you to review the results.  Testing/Procedures: none  Follow-Up: 1 Year with Dr Rayann Heman At [facility], you and your health needs are our priority.  As part of our continuing mission to provide you with exceptional heart care, we have created designated Provider Care Teams.  These Care Teams include your primary Cardiologist (physician) and Advanced Practice Providers (APPs -  Physician Assistants and Nurse Practitioners) who all work together to provide you with the care you need, when you need it.  Your next appointment:   12 months  The format for your next appointment:   In Person  Provider:   Dr Rayann Heman  Other Instructions Remote monitoring is used to monitor your Pacemaker from home. This monitoring reduces the number of office visits required to check your device to one time per year. It allows [place] to keep an eye on the functioning of your device to ensure it is working properly. You are scheduled for a device check from home on 12/06/18. You may send your transmission at any time that day. If you have a wireless device, the transmission will be sent automatically. After your physician reviews your transmission, you will receive a postcard with your next transmission date.

## 2018-11-16 ENCOUNTER — Other Ambulatory Visit: Payer: Self-pay | Admitting: *Deleted

## 2018-11-16 ENCOUNTER — Other Ambulatory Visit: Payer: Self-pay

## 2018-11-16 ENCOUNTER — Ambulatory Visit
Admission: RE | Admit: 2018-11-16 | Discharge: 2018-11-16 | Disposition: A | Discharge status: Home / Self Care | Payer: Medicare Other | Source: Ambulatory Visit | Attending: Family Medicine | Admitting: Family Medicine

## 2018-11-16 DIAGNOSIS — R1012 Left upper quadrant pain: Secondary | ICD-10-CM

## 2018-11-16 DIAGNOSIS — Z8711 Personal history of peptic ulcer disease: Secondary | ICD-10-CM

## 2018-11-16 MED ORDER — IOPAMIDOL (ISOVUE-300) INJECTION 61%
100.0000 mL | Freq: Once | INTRAVENOUS | Status: AC | PRN
  Administered 2018-11-16: 100 mL via INTRAVENOUS

## 2018-11-16 NOTE — Progress Notes (Signed)
Please call patient: I have reviewed his/her lab results. CT scan is normal. No evidence for cause of pain in abdomen. If still present, rec referral to GI for further evaluation. Can refer if needed.thanks.

## 2018-11-19 ENCOUNTER — Ambulatory Visit
Admission: RE | Admit: 2018-11-19 | Discharge: 2018-11-19 | Disposition: A | Discharge status: Home / Self Care | Payer: Medicare Other | Source: Ambulatory Visit | Attending: Family Medicine | Admitting: Family Medicine

## 2018-11-19 ENCOUNTER — Other Ambulatory Visit: Payer: Self-pay

## 2018-11-19 ENCOUNTER — Encounter: Payer: Self-pay | Admitting: Family Medicine

## 2018-11-19 DIAGNOSIS — Z1231 Encounter for screening mammogram for malignant neoplasm of breast: Secondary | ICD-10-CM

## 2018-11-20 ENCOUNTER — Encounter: Payer: Self-pay | Admitting: Gastroenterology

## 2018-11-23 ENCOUNTER — Encounter: Payer: BC Managed Care – PPO | Admitting: Family Medicine

## 2018-12-06 ENCOUNTER — Encounter: Payer: BC Managed Care – PPO | Admitting: *Deleted

## 2018-12-10 ENCOUNTER — Telehealth: Payer: Self-pay

## 2018-12-10 NOTE — Telephone Encounter (Signed)
Left message for patient to remind of missed remote transmission.  

## 2018-12-11 ENCOUNTER — Ambulatory Visit (INDEPENDENT_AMBULATORY_CARE_PROVIDER_SITE_OTHER): Payer: Medicare Other | Admitting: *Deleted

## 2018-12-11 DIAGNOSIS — I442 Atrioventricular block, complete: Secondary | ICD-10-CM | POA: Diagnosis not present

## 2018-12-11 DIAGNOSIS — I480 Paroxysmal atrial fibrillation: Secondary | ICD-10-CM

## 2018-12-12 LAB — CUP PACEART REMOTE DEVICE CHECK
Battery Impedance: 4721 Ohm
Battery Remaining Longevity: 16 mo
Battery Voltage: 2.69 V
Brady Statistic RV Percent Paced: 0 %
Date Time Interrogation Session: 20201117150124
Implantable Lead Implant Date: 19960815
Implantable Lead Implant Date: 19960815
Implantable Lead Location: 753859
Implantable Lead Location: 753860
Implantable Lead Model: 5024
Implantable Pulse Generator Implant Date: 20090205
Lead Channel Impedance Value: 67 Ohm
Lead Channel Impedance Value: 761 Ohm
Lead Channel Pacing Threshold Amplitude: 1 V
Lead Channel Pacing Threshold Pulse Width: 0.4 ms
Lead Channel Setting Pacing Amplitude: 2.25 V
Lead Channel Setting Pacing Pulse Width: 0.4 ms
Lead Channel Setting Sensing Sensitivity: 2.8 mV

## 2018-12-18 ENCOUNTER — Telehealth (HOSPITAL_COMMUNITY): Payer: Self-pay | Admitting: *Deleted

## 2018-12-18 MED ORDER — DILTIAZEM HCL ER COATED BEADS 120 MG PO CP24: 120.0000 mg | ORAL_CAPSULE | Freq: Two times a day (BID) | ORAL | 2 refills | Status: DC

## 2018-12-18 NOTE — Telephone Encounter (Signed)
Patient called in with concern regarding increase in afib burden. Having to take more cardizem 30mg  tabs. Has appt next week but worried with upcoming holiday of what to do. BP this morning is 126/78 HR 73. Discussed with Adline Peals PA will increase cardizem to 120mg  twice a day monitor her BP/HR. If issues with hypotension let us know. Pt verbalized understanding of recommendations.

## 2018-12-25 ENCOUNTER — Ambulatory Visit (INDEPENDENT_AMBULATORY_CARE_PROVIDER_SITE_OTHER): Payer: Medicare Other

## 2018-12-25 ENCOUNTER — Encounter: Payer: Self-pay | Admitting: Gastroenterology

## 2018-12-25 ENCOUNTER — Other Ambulatory Visit: Payer: Self-pay

## 2018-12-25 ENCOUNTER — Ambulatory Visit: Payer: Medicare Other | Admitting: Gastroenterology

## 2018-12-25 VITALS — BP 110/80 | HR 72 | Temp 97.8°F | Ht 67.0 in | Wt 204.2 lb

## 2018-12-25 DIAGNOSIS — R1012 Left upper quadrant pain: Secondary | ICD-10-CM

## 2018-12-25 DIAGNOSIS — Z1159 Encounter for screening for other viral diseases: Secondary | ICD-10-CM

## 2018-12-25 NOTE — Progress Notes (Signed)
HPI: This is a very pleasant 68 year old woman who was referred to me by Leamon Arnt, MD  to evaluate left upper quadrant pain.    Chief complaint is left upper quadrant pain  She has had intermittent, dull, left upper quadrant pain for about 6 months.  This is not associated with eating or moving her bowels.  The pain occurs about once or twice a month and can last for several hours.  She does not have any associated nausea.  Pain is not positional.  Her weight has been overall stable lately.  She previously had lost 20 pounds or so quite intentionally.  She does not take NSAIDs.  She is on Eliquis for intermittent A. Fib.  No overt GI bleeding, no dysphagia.  Old Data Reviewed: CT scan abdomen pelvis with IV and oral contrast October 2020 indication "left-sided abdominal pain for several months"; findings "sigmoid diverticulosis.  No radiographic evidence of diverticulitis or other acute findings"  EGD December 2017 Dr. Alice Reichert in [place].  Indication "dysphagia, peptic stricture, chronic gastric ulcer" findings normal esophagus, normal duodenum, polyp in the gastric body, polyps in the cardia and fundus.  Biopsied.  I do not have the pathology results at the time of this visit.  A letter to the patient was written with regard to the biopsies and it said that the "small polyp we biopsied was not cancerous, as I had expected.  Good news."  Colonoscopy December 2017 Dr. Alice Reichert in [place], indication "screening" findings "grade 2 internal hemorrhoids, moderate diverticulosis of the sigmoid colon"  EGD December 2007 Dr. Penelope Coop at [facility] gastroenterology indication "heartburn, dysphagia".  Findings LA grade B esophagitis.  Biopsies were taken.  Mild stenosis was found 34 cm from the incisors.  Hiatal hernia was noted.  She was recommended be on proton pump inhibitor twice daily.  Biopsies showed no H. pylori,, GE junction biopsies showed "ulceration and marked squamous atypia"  Colonoscopy  December 2007 Dr. Rosamaria Lints gastroenterology, indication average risk screening" nodular mucosa was seen at the base of the cecum these were biopsied.  Pathology report suggested "ulcerated colonic mucosa" comment section showed that this was "strongly suggestive of ischemia"  Follow-up office note with Dr. Penelope Coop at [facility] gastroenterology January 2008 he felt that the area at her cecum was probably related to a lot of Motrin which she had been taking around that time.  Blood work September 2020 shows normal CBC, normal complete metabolic profile, normal pancreatic enzymes, normal sedimentation rate.  Review of systems: Pertinent positive and negative review of systems were noted in the above HPI section. All other review negative.   Past Medical History:  Diagnosis Date  . AR (allergic rhinitis)   . Atrial fibrillation ([place])   . Diverticulosis   . GERD (gastroesophageal reflux disease)   . Hiatal hernia   . Hiatal hernia   . Hypothyroidism   . Migraines   . Mild sleep apnea   . Osteoarthritis   . Osteopenia after menopause 06/14/2017   DEXA 08/2016 [facility]: T = -1.10 radius, -1.0 femur; lumbar spine elevated due to DJD; recheck 2-3 years.  . Pacemaker    CHB  . Paroxysmal A-fib ([place])   . PUD (peptic ulcer disease)   . Thyroid nodule   . Transient complete heart block 1996    Past Surgical History:  Procedure Laterality Date  . HIP ARTHROPLASTY Right 2008  . PACEMAKER GENERATOR CHANGE  03/01/2007   performed by Dr Rosita Fire at [facility])  .  PACEMAKER INSERTION  09/08/1994   performed in [facility] for transient complete heart block  . THYROIDECTOMY, PARTIAL  1986   BENIGN NODULES  . TOTAL HIP ARTHROPLASTY Left 02/13/2018   Procedure: TOTAL HIP ARTHROPLASTY ANTERIOR APPROACH;  Surgeon: Paralee Cancel, MD;  Location: [facility];  Service: Orthopedics;  Laterality: Left;  70 mins  . TUBAL LIGATION  1987    Current Outpatient Medications  Medication Sig Dispense  Refill  . Calcium Carb-Cholecalciferol (CALCIUM/VITAMIN D) 600-400 MG-UNIT TABS Take 1 tablet by mouth daily.     . Cyanocobalamin (B-12) 1000 MCG SUBL Place 1 mL under the tongue 2 (two) times a week.    . diclofenac sodium (VOLTAREN) 1 % GEL Apply 2 g topically 4 (four) times daily as needed. 100 g 5  . diltiazem (CARDIZEM CD) 120 MG 24 hr capsule Take 1 capsule (120 mg total) by mouth 2 (two) times daily. 90 capsule 2  . diltiazem (CARDIZEM) 30 MG tablet Take 1 tablet every 4 hours AS NEEDED for AFIB heart rate >100 45 tablet 3  . ELIQUIS 5 MG TABS tablet TAKE 1 TABLET BY MOUTH TWICE DAILY. 180 tablet 1  . erythromycin ophthalmic ointment Place 1 application into the left eye 3 (three) times daily. 3.5 g 0  . fexofenadine (ALLEGRA) 180 MG tablet Take 180 mg by mouth daily.    Marland Kitchen levothyroxine (SYNTHROID, LEVOTHROID) 125 MCG tablet Take 1 tablet (125 mcg total) by mouth daily. 90 tablet 3  . Magnesium 250 MG TABS Take 250 mg by mouth daily.    Marland Kitchen omeprazole (PRILOSEC) 20 MG capsule TAKE (1) CAPSULE DAILY. 90 capsule 3  . Potassium 99 MG TABS Take 1 tablet by mouth daily.    . sodium chloride (OCEAN) 0.65 % SOLN nasal spray Place 1 spray into both nostrils as needed for congestion.    . traMADol (ULTRAM) 50 MG tablet Take 1 tablet (50 mg total) by mouth every 6 (six) hours as needed for moderate pain. 30 tablet 0  . vitamin C (ASCORBIC ACID) 500 MG tablet Take 500 mg by mouth daily.    Marland Kitchen amoxicillin (AMOXIL) 500 MG capsule Take 2,000 mg by mouth See admin instructions. Before Dental Visits     No current facility-administered medications for this visit.     Allergies as of 12/25/2018 - Review Complete 12/25/2018  Allergen Reaction Noted  . Morphine and related Hives and Itching 01/08/2013  . Neomycin Other (See Comments) 01/08/2013  . Adhesive [tape] Rash 10/06/2015    Family History  Problem Relation Age of Onset  . Diabetes Mellitus II Maternal Uncle   . Cancer Maternal Grandmother         ENDOMETRIAL  . Rheum arthritis Mother   . Peptic Ulcer Mother   . COPD Brother   . Drug abuse Brother   . Breast cancer Neg Hx     Social History   Socioeconomic History  . Marital status: Married    Spouse name: Not on file  . Number of children: 0  . Years of education: Not on file  . Highest education level: Not on file  Occupational History  . Occupation: retired  Scientific laboratory technician  . Financial resource strain: Not on file  . Food insecurity    Worry: Not on file    Inability: Not on file  . Transportation needs    Medical: Not on file    Non-medical: Not on file  Tobacco Use  . Smoking status: Never Smoker  .  Smokeless tobacco: Never Used  Substance and Sexual Activity  . Alcohol use: Yes    Alcohol/week: 1.0 standard drinks    Types: 1 Glasses of wine per week    Comment: occasionally  . Drug use: No  . Sexual activity: Not Currently  Lifestyle  . Physical activity    Days per week: Not on file    Minutes per session: Not on file  . Stress: Not on file  Relationships  . Social Herbalist on phone: Not on file    Gets together: Not on file    Attends religious service: Not on file    Active member of club or organization: Not on file    Attends meetings of clubs or organizations: Not on file    Relationship status: Not on file  . Intimate partner violence    Fear of current or ex partner: Not on file    Emotionally abused: Not on file    Physically abused: Not on file    Forced sexual activity: Not on file  Other Topics Concern  . 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   Married, no children     Physical Exam: Temp 97.8 F (36.6 C)   Ht 5\' 7"  (1.702 m) Comment: height measured without shoes  Wt 204 lb 4 oz (92.6 kg)   BMI 31.99 kg/m  Constitutional: generally well-appearing Psychiatric: alert and oriented x3 Eyes: extraocular movements intact Mouth: oral pharynx moist, no  lesions Neck: supple no lymphadenopathy Cardiovascular: heart regular rate and rhythm Lungs: clear to auscultation bilaterally Abdomen: soft, nontender, nondistended, no obvious ascites, no peritoneal signs, normal bowel sounds Extremities: no lower extremity edema bilaterally Skin: no lesions on visible extremities   Assessment and plan: 68 y.o. female with intermittent left upper quadrant discomfort  Explained to her I think is unlikely any serious given her CT scan, lab tests, clinical history.  I recommended upper endoscopy as the next reasonable test and she is interested in going ahead with that.  She can stay on her blood thinner without having to hold it preprocedure.  I see no reason for any further blood tests or imaging studies prior to then.    Please see the "Patient Instructions" section for addition details about the plan.   Owens Loffler, MD [facility] 12/25/2018, 9:24 AM  Cc: Leamon Arnt, MD

## 2018-12-25 NOTE — Patient Instructions (Signed)
If you are age 67 or older, your body mass index should be between 23-30. Your Body mass index is 31.99 kg/m. If this is out of the aforementioned range listed, please consider follow up with your Primary Care Provider.  If you are age 9 or younger, your body mass index should be between 19-25. Your Body mass index is 31.99 kg/m. If this is out of the aformentioned range listed, please consider follow up with your Primary Care Provider.   You have been scheduled for an endoscopy. Please follow written instructions given to you at your visit today. If you use inhalers (even only as needed), please bring them with you on the day of your procedure.   Do not hold Eliquis per Dr. Ardis Hughs.

## 2018-12-26 ENCOUNTER — Ambulatory Visit (HOSPITAL_COMMUNITY)
Admission: RE | Admit: 2018-12-26 | Discharge: 2018-12-26 | Disposition: A | Discharge status: Home / Self Care | Payer: Medicare Other | Source: Ambulatory Visit | Attending: Physician Assistant | Admitting: Physician Assistant

## 2018-12-26 ENCOUNTER — Other Ambulatory Visit: Payer: Self-pay | Admitting: Family Medicine

## 2018-12-26 ENCOUNTER — Other Ambulatory Visit: Payer: Self-pay | Admitting: Internal Medicine

## 2018-12-26 ENCOUNTER — Other Ambulatory Visit: Payer: Self-pay

## 2018-12-26 VITALS — BP 124/74 | HR 69 | Ht 67.0 in | Wt 205.6 lb

## 2018-12-26 DIAGNOSIS — Z791 Long term (current) use of non-steroidal anti-inflammatories (NSAID): Secondary | ICD-10-CM | POA: Insufficient documentation

## 2018-12-26 DIAGNOSIS — Z96642 Presence of left artificial hip joint: Secondary | ICD-10-CM | POA: Insufficient documentation

## 2018-12-26 DIAGNOSIS — I341 Nonrheumatic mitral (valve) prolapse: Secondary | ICD-10-CM | POA: Insufficient documentation

## 2018-12-26 DIAGNOSIS — Z79890 Hormone replacement therapy (postmenopausal): Secondary | ICD-10-CM | POA: Insufficient documentation

## 2018-12-26 DIAGNOSIS — G4730 Sleep apnea, unspecified: Secondary | ICD-10-CM | POA: Diagnosis not present

## 2018-12-26 DIAGNOSIS — I442 Atrioventricular block, complete: Secondary | ICD-10-CM | POA: Diagnosis not present

## 2018-12-26 DIAGNOSIS — Z881 Allergy status to other antibiotic agents status: Secondary | ICD-10-CM | POA: Insufficient documentation

## 2018-12-26 DIAGNOSIS — I4510 Unspecified right bundle-branch block: Secondary | ICD-10-CM | POA: Insufficient documentation

## 2018-12-26 DIAGNOSIS — Z886 Allergy status to analgesic agent status: Secondary | ICD-10-CM | POA: Diagnosis not present

## 2018-12-26 DIAGNOSIS — E669 Obesity, unspecified: Secondary | ICD-10-CM | POA: Insufficient documentation

## 2018-12-26 DIAGNOSIS — Z833 Family history of diabetes mellitus: Secondary | ICD-10-CM | POA: Diagnosis not present

## 2018-12-26 DIAGNOSIS — Z8049 Family history of malignant neoplasm of other genital organs: Secondary | ICD-10-CM | POA: Insufficient documentation

## 2018-12-26 DIAGNOSIS — Z9851 Tubal ligation status: Secondary | ICD-10-CM | POA: Insufficient documentation

## 2018-12-26 DIAGNOSIS — Z79899 Other long term (current) drug therapy: Secondary | ICD-10-CM | POA: Diagnosis not present

## 2018-12-26 DIAGNOSIS — I480 Paroxysmal atrial fibrillation: Secondary | ICD-10-CM | POA: Insufficient documentation

## 2018-12-26 DIAGNOSIS — M1990 Unspecified osteoarthritis, unspecified site: Secondary | ICD-10-CM | POA: Insufficient documentation

## 2018-12-26 DIAGNOSIS — Z888 Allergy status to other drugs, medicaments and biological substances status: Secondary | ICD-10-CM | POA: Insufficient documentation

## 2018-12-26 DIAGNOSIS — E039 Hypothyroidism, unspecified: Secondary | ICD-10-CM | POA: Insufficient documentation

## 2018-12-26 DIAGNOSIS — Z6832 Body mass index (BMI) 32.0-32.9, adult: Secondary | ICD-10-CM | POA: Insufficient documentation

## 2018-12-26 DIAGNOSIS — Z7901 Long term (current) use of anticoagulants: Secondary | ICD-10-CM | POA: Insufficient documentation

## 2018-12-26 DIAGNOSIS — Z950 Presence of cardiac pacemaker: Secondary | ICD-10-CM | POA: Diagnosis not present

## 2018-12-26 DIAGNOSIS — G4733 Obstructive sleep apnea (adult) (pediatric): Secondary | ICD-10-CM | POA: Insufficient documentation

## 2018-12-26 DIAGNOSIS — K219 Gastro-esophageal reflux disease without esophagitis: Secondary | ICD-10-CM | POA: Diagnosis not present

## 2018-12-26 LAB — SARS CORONAVIRUS 2 (TAT 6-24 HRS): SARS Coronavirus 2: NEGATIVE

## 2018-12-26 MED ORDER — FLECAINIDE ACETATE 50 MG PO TABS: 50.0000 mg | ORAL_TABLET | Freq: Two times a day (BID) | ORAL | 3 refills | Status: DC

## 2018-12-26 NOTE — Progress Notes (Signed)
Primary Care Physician: Leamon Arnt, MD Primary Electrophysiologist: Dr Rayann Heman Referring Physician: Dr Chancy Hurter is a 68 y.o. female with a history of paroxysmal atrial fibrillation, mild OSA, CHB s/p PPM, PUD who presents for follow up in the [facility]. Patient reports that over the last couple months she has had more episodes of afib, some lasting >24 hours with symptoms of SOB, fatigue, and palpitations. There were no specific triggers that the patient could identify. She has increased her diltiazem to BID with some relief. She is tolerating the medication without difficulty.   Today, she denies symptoms of chest pain, orthopnea, PND, lower extremity edema, dizziness, presyncope, syncope, snoring, daytime somnolence, bleeding, or neurologic sequela. The patient is tolerating medications without difficulties and is otherwise without complaint today.    Atrial Fibrillation Risk Factors:  she does have symptoms or diagnosis of sleep apnea. Borderline. she is not on CPAP therapy. she does not have a history of rheumatic fever. she does not have a history of alcohol use. The patient does not have a history of early familial atrial fibrillation or other arrhythmias.  she has a BMI of Body mass index is 32.2 kg/m.Marland Kitchen Filed Weights   12/26/18 0926  Weight: 93.3 kg    Family History  Problem Relation Age of Onset  . Diabetes Mellitus II Maternal Uncle   . Cancer Maternal Grandmother        ENDOMETRIAL  . Rheum arthritis Mother   . Peptic Ulcer Mother   . COPD Brother   . Drug abuse Brother   . Breast cancer Neg Hx      Atrial Fibrillation Management history:  Previous antiarrhythmic drugs: none Previous cardioversions: none Previous ablations: none CHADS2VASC score: 2 Anticoagulation history: Eliquis   Past Medical History:  Diagnosis Date  . AR (allergic rhinitis)   . Atrial fibrillation ([place])   . Diverticulosis   .  GERD (gastroesophageal reflux disease)   . Hiatal hernia   . Hiatal hernia   . Hypothyroidism   . Migraines   . Mild sleep apnea   . Osteoarthritis   . Osteopenia after menopause 06/14/2017   DEXA 08/2016 [facility]: T = -1.10 radius, -1.0 femur; lumbar spine elevated due to DJD; recheck 2-3 years.  . Pacemaker    CHB  . Paroxysmal A-fib ([place])   . PUD (peptic ulcer disease)   . Thyroid nodule   . Transient complete heart block 1996   Past Surgical History:  Procedure Laterality Date  . HIP ARTHROPLASTY Right 2008  . PACEMAKER GENERATOR CHANGE  03/01/2007   performed by Dr Rosita Fire at [facility])  . PACEMAKER INSERTION  09/08/1994   performed in [facility] for transient complete heart block  . THYROIDECTOMY, PARTIAL  1986   BENIGN NODULES  . TOTAL HIP ARTHROPLASTY Left 02/13/2018   Procedure: TOTAL HIP ARTHROPLASTY ANTERIOR APPROACH;  Surgeon: Paralee Cancel, MD;  Location: [facility];  Service: Orthopedics;  Laterality: Left;  70 mins  . TUBAL LIGATION  1987    Current Outpatient Medications  Medication Sig Dispense Refill  . amoxicillin (AMOXIL) 500 MG capsule Take 2,000 mg by mouth See admin instructions. Before Dental Visits    . Calcium Carb-Cholecalciferol (CALCIUM/VITAMIN D) 600-400 MG-UNIT TABS Take 1 tablet by mouth daily.     . Cyanocobalamin (B-12) 1000 MCG SUBL Place 1 mL under the tongue 2 (two) times a week.    . diclofenac sodium (VOLTAREN) 1 %  GEL Apply 2 g topically 4 (four) times daily as needed. 100 g 5  . diltiazem (CARDIZEM CD) 120 MG 24 hr capsule Take 1 capsule (120 mg total) by mouth 2 (two) times daily. 90 capsule 2  . diltiazem (CARDIZEM) 30 MG tablet Take 1 tablet every 4 hours AS NEEDED for AFIB heart rate >100 45 tablet 3  . ELIQUIS 5 MG TABS tablet TAKE 1 TABLET BY MOUTH TWICE DAILY. 180 tablet 1  . erythromycin ophthalmic ointment Place 1 application into the left eye 3 (three) times daily. 3.5 g 0  . fexofenadine (ALLEGRA) 180 MG tablet Take  180 mg by mouth daily.    Marland Kitchen levothyroxine (SYNTHROID, LEVOTHROID) 125 MCG tablet Take 1 tablet (125 mcg total) by mouth daily. 90 tablet 3  . Magnesium 250 MG TABS Take 250 mg by mouth daily.    Marland Kitchen omeprazole (PRILOSEC) 20 MG capsule TAKE (1) CAPSULE DAILY. 90 capsule 3  . Potassium 99 MG TABS Take 1 tablet by mouth daily.    . sodium chloride (OCEAN) 0.65 % SOLN nasal spray Place 1 spray into both nostrils as needed for congestion.    . traMADol (ULTRAM) 50 MG tablet Take 1 tablet (50 mg total) by mouth every 6 (six) hours as needed for moderate pain. (Patient taking differently: Take 50 mg by mouth as needed for moderate pain. ) 30 tablet 0  . vitamin C (ASCORBIC ACID) 500 MG tablet Take 500 mg by mouth daily.     No current facility-administered medications for this encounter.     Allergies  Allergen Reactions  . Morphine And Related Hives and Itching  . Neomycin Other (See Comments)    itching   . Adhesive [Tape] Rash    Blistering rash     Social History   Socioeconomic History  . Marital status: Married    Spouse name: Not on file  . Number of children: 0  . Years of education: Not on file  . Highest education level: Not on file  Occupational History  . Occupation: retired  Scientific laboratory technician  . Financial resource strain: Not on file  . Food insecurity    Worry: Not on file    Inability: Not on file  . Transportation needs    Medical: Not on file    Non-medical: Not on file  Tobacco Use  . Smoking status: Never Smoker  . Smokeless tobacco: Never Used  Substance and Sexual Activity  . Alcohol use: Yes    Alcohol/week: 1.0 standard drinks    Types: 1 Glasses of wine per week    Comment: occasionally  . Drug use: No  . Sexual activity: Not Currently  Lifestyle  . Physical activity    Days per week: Not on file    Minutes per session: Not on file  . Stress: Not on file  Relationships  . Social Herbalist on phone: Not on file    Gets together: Not on file     Attends religious service: Not on file    Active member of club or organization: Not on file    Attends meetings of clubs or organizations: Not on file    Relationship status: Not on file  . Intimate partner violence    Fear of current or ex partner: Not on file    Emotionally abused: Not on file    Physically abused: Not on file    Forced sexual activity: Not on file  Other Topics Concern  .  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  Physical Exam: Vitals:   12/26/18 0926  BP: 124/74  Pulse: 69  Weight: 93.3 kg  Height: 5\' 7"  (1.702 m)    GEN- The patient is well appearing obese female, alert and oriented x 3 today.   HEENT-head normocephalic, atraumatic, sclera clear, conjunctiva pink, hearing intact, trachea midline. Lungs- Clear to ausculation bilaterally, normal work of breathing Heart- Regular rate and rhythm, no murmurs, rubs or gallops  GI- soft, NT, ND, + BS Extremities- no clubbing, cyanosis, or edema MS- no significant deformity or atrophy Skin- no rash or lesion Psych- euthymic mood, full affect Neuro- strength and sensation are intact   Wt Readings from Last 3 Encounters:  12/26/18 93.3 kg  12/25/18 92.6 kg  11/08/18 90.7 kg    EKG today demonstrates SR HR 69, inc RBBB, PR 158, QRS 106, QTc 439  Echo 07/24/18 demonstrated  1. The left ventricle has normal systolic function with an ejection fraction of 60-65%. The cavity size was normal. Left ventricular diastolic Doppler parameters are consistent with impaired relaxation. No evidence of left ventricular regional wall  motion abnormalities.  2. The right ventricle has normal systolic function. The cavity was normal. There is no increase in right ventricular wall thickness.  3. Left atrial size was mildly dilated.  4. Right atrial size was mildly dilated.   5. Mild mitral valve prolapse.  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 Mild thickening of the mitral valve leaflet.  7. The aortic valve is tricuspid.  Epic records are reviewed at length today  Assessment and Plan:  1. Paroxsymal atrial fibrillation We discussed therapeutic options today including AAD and ablation. After discussing the risks and benefits, will start flecainide 50 mg BID. Patient also interested in ablation for long term management. Will refer to Dr Rayann Heman to discuss.  Continue diltiazem 120 mg BID Continue diltiazem 30 mg PRN q4hrs for heart racing. Continue Eliquis 5 mg BID. Recall patient prefers to continue.  This patients CHA2DS2-VASc Score and unadjusted Ischemic Stroke Rate (% per year) is equal to 2.2 % stroke rate/year from a score of 2  Above score calculated as 1 point each if present [CHF, HTN, DM, Vascular=MI/PAD/Aortic Plaque, Age if 65-74, or Female] Above score calculated as 2 points each if present [Age > 75, or Stroke/TIA/TE]   2. Obesity Body mass index is 32.2 kg/m. Lifestyle modification was discussed at length including regular exercise and weight reduction.  3. CHB S/p PPM followed by Dr Rayann Heman and the [facility].   Follow up one week for ECG. Follow up with Dr Rayann Heman to discuss ablation.   [facility] [address] [place], [place] [address] [phone] 12/26/2018 9:45 AM

## 2018-12-26 NOTE — Telephone Encounter (Signed)
Pt last saw Clint Fenton, PA on 12/26/18, last labs 10/08/18 Creat 0.80, age 68, weight 93.3kg, based on specified criteria pt is on appropriate dosage of Eliquis 5mg  BID.  Will refill rx.

## 2018-12-26 NOTE — Patient Instructions (Signed)
Start Flecainide 50mg twice a day 

## 2018-12-27 ENCOUNTER — Other Ambulatory Visit (HOSPITAL_COMMUNITY): Payer: Self-pay | Admitting: *Deleted

## 2018-12-27 MED ORDER — DILTIAZEM HCL ER COATED BEADS 120 MG PO CP24: 120.0000 mg | ORAL_CAPSULE | Freq: Two times a day (BID) | ORAL | 2 refills | Status: DC

## 2018-12-28 ENCOUNTER — Other Ambulatory Visit: Payer: Self-pay

## 2018-12-28 ENCOUNTER — Encounter: Payer: Self-pay | Admitting: Gastroenterology

## 2018-12-28 ENCOUNTER — Ambulatory Visit (AMBULATORY_SURGERY_CENTER): Payer: Medicare Other | Admitting: Gastroenterology

## 2018-12-28 VITALS — BP 111/73 | HR 70 | Temp 97.8°F | Resp 16 | Ht 67.0 in | Wt 204.0 lb

## 2018-12-28 DIAGNOSIS — R1012 Left upper quadrant pain: Secondary | ICD-10-CM | POA: Diagnosis not present

## 2018-12-28 DIAGNOSIS — K317 Polyp of stomach and duodenum: Secondary | ICD-10-CM

## 2018-12-28 DIAGNOSIS — K449 Diaphragmatic hernia without obstruction or gangrene: Secondary | ICD-10-CM

## 2018-12-28 DIAGNOSIS — Z950 Presence of cardiac pacemaker: Secondary | ICD-10-CM

## 2018-12-28 MED ORDER — SODIUM CHLORIDE 0.9 % IV SOLN: 500.0000 mL | INTRAVENOUS | Status: DC

## 2018-12-28 MED ORDER — DICYCLOMINE HCL 10 MG PO CAPS: 10.0000 mg | ORAL_CAPSULE | Freq: Three times a day (TID) | ORAL | 0 refills | Status: DC | PRN

## 2018-12-28 NOTE — Op Note (Signed)
[place] Patient Name: Kristin [name] Procedure Date: 12/28/2018 2:34 PM MRN: [id number] Endoscopist: Mauri Pole , MD Age: 68 Referring MD:  Date of Birth: 27-Jan-1950 Gender: Female Account #: [id number] Procedure:                Upper GI endoscopy Indications:              Abdominal pain in the left upper quadrant Medicines:                Monitored Anesthesia Care Procedure:                Pre-Anesthesia Assessment:                           - Prior to the procedure, a History and Physical                            was performed, and patient medications and                            allergies were reviewed. The patient's tolerance of                            previous anesthesia was also reviewed. The risks                            and benefits of the procedure and the sedation                            options and risks were discussed with the patient.                            All questions were answered, and informed consent                            was obtained. Prior Anticoagulants: The patient                            last took Eliquis (apixaban) on the day of the                            procedure. ASA Grade Assessment: III - A patient                            with severe systemic disease. After reviewing the                            risks and benefits, the patient was deemed in                            satisfactory condition to undergo the procedure.                           After obtaining informed consent, the endoscope was  passed under direct vision. Throughout the                            procedure, the patient's blood pressure, pulse, and                            oxygen saturations were monitored continuously. The                            Endoscope was introduced through the mouth, and                            advanced to the second part of duodenum. The upper                            GI  endoscopy was accomplished without difficulty.                            The patient tolerated the procedure well. Scope In: Scope Out: Findings:                 The esophagus was normal.                           The Z-line was regular and was found 35 cm from the                            incisors.                           A small hiatal hernia was present.                           Multiple sessile fundic gland polyps were found in                            the gastric fundus and in the gastric body.                           The cardia and gastric fundus were normal otherwise                            on retroflexion.                           The examined duodenum was normal. Complications:            No immediate complications. Estimated Blood Loss:     Estimated blood loss: none. Impression:               - Normal esophagus.                           - Z-line regular, 35 cm from the incisors.                           -  Small hiatal hernia.                           - Multiple fundic gland polyps.                           - Normal examined duodenum.                           - No specimens collected. Recommendation:           - Resume previous diet.                           - Continue present medications.                           - Trial of Dicyclomine 10mg  three times daily as                            needed for abdominal pain/IBS symptoms X 90 tabs                            with no refills                           - Return to GI office (Dr. Eugenia Pancoast) in 3 months,                            nexta available appointment Mauri Pole, MD 12/28/2018 2:48:27 PM This report has been signed electronically.

## 2018-12-28 NOTE — Patient Instructions (Signed)
Thank you for allowing [place] to care for you today!  Continue current medications including Eliquis.  Begin trial of Dicyclomine 10 mg 3 times per day as needed for abdominal pain and/or IBS.  Prescription sent to your pharmacy.  Resume your current diet.  Return to your normal activities tomorrow.  Return to GI clinic to see Dr Ardis Hughs in 3 months, next available appointment.  We will call you to arrange this appointment.      YOU HAD AN ENDOSCOPIC PROCEDURE TODAY AT [facility]:   Refer to the procedure report that was given to you for any specific questions about what was found during the examination.  If the procedure report does not answer your questions, please call your gastroenterologist to clarify.  If you requested that your care partner not be given the details of your procedure findings, then the procedure report has been included in a sealed envelope for you to review at your convenience later.  YOU SHOULD EXPECT: Some feelings of bloating in the abdomen. Passage of more gas than usual.  Walking can help get rid of the air that was put into your GI tract during the procedure and reduce the bloating. If you had a lower endoscopy (such as a colonoscopy or flexible sigmoidoscopy) you may notice spotting of blood in your stool or on the toilet paper. If you underwent a bowel prep for your procedure, you may not have a normal bowel movement for a few days.  Please Note:  You might notice some irritation and congestion in your nose or some drainage.  This is from the oxygen used during your procedure.  There is no need for concern and it should clear up in a day or so.  SYMPTOMS TO REPORT IMMEDIATELY:    Following upper endoscopy (EGD)  Vomiting of blood or coffee ground material  New chest pain or pain under the shoulder blades  Painful or persistently difficult swallowing  New shortness of breath  Fever of 100F or higher  Black, tarry-looking stools  For  urgent or emergent issues, a gastroenterologist can be reached at any hour by calling [phone].   DIET:  We do recommend a small meal at first, but then you may proceed to your regular diet.  Drink plenty of fluids but you should avoid alcoholic beverages for 24 hours.  ACTIVITY:  You should plan to take it easy for the rest of today and you should NOT DRIVE or use heavy machinery until tomorrow (because of the sedation medicines used during the test).    FOLLOW UP: Our staff will call the number listed on your records 48-72 hours following your procedure to check on you and address any questions or concerns that you may have regarding the information given to you following your procedure. If we do not reach you, we will leave a message.  We will attempt to reach you two times.  During this call, we will ask if you have developed any symptoms of COVID 19. If you develop any symptoms (ie: fever, flu-like symptoms, shortness of breath, cough etc.) before then, please call [phone].  If you test positive for Covid 19 in the 2 weeks post procedure, please call and report this information to [place].    If any biopsies were taken you will be contacted by phone or by letter within the next 1-3 weeks.  Please call us at [phone] if you have not heard about the biopsies in 3 weeks.  SIGNATURES/CONFIDENTIALITY: You and/or your care partner have signed paperwork which will be entered into your electronic medical record.  These signatures attest to the fact that that the information above on your After Visit Summary has been reviewed and is understood.  Full responsibility of the confidentiality of this discharge information lies with you and/or your care-partner.

## 2018-12-28 NOTE — Progress Notes (Signed)
Report to PACU, RN, vss, BBS= Clear.  

## 2018-12-31 ENCOUNTER — Other Ambulatory Visit: Payer: Self-pay

## 2018-12-31 ENCOUNTER — Other Ambulatory Visit (HOSPITAL_COMMUNITY): Payer: Medicare Other

## 2018-12-31 ENCOUNTER — Other Ambulatory Visit: Payer: Medicare Other | Admitting: *Deleted

## 2018-12-31 ENCOUNTER — Encounter: Payer: Self-pay | Admitting: Internal Medicine

## 2018-12-31 ENCOUNTER — Telehealth: Payer: Self-pay

## 2018-12-31 ENCOUNTER — Telehealth (INDEPENDENT_AMBULATORY_CARE_PROVIDER_SITE_OTHER): Payer: Medicare Other | Admitting: Internal Medicine

## 2018-12-31 VITALS — BP 126/76 | HR 70 | Ht 67.0 in | Wt 204.0 lb

## 2018-12-31 DIAGNOSIS — I442 Atrioventricular block, complete: Secondary | ICD-10-CM | POA: Diagnosis not present

## 2018-12-31 DIAGNOSIS — I480 Paroxysmal atrial fibrillation: Secondary | ICD-10-CM

## 2018-12-31 MED ORDER — METOPROLOL TARTRATE 100 MG PO TABS: ORAL_TABLET | ORAL | 0 refills | Status: DC

## 2018-12-31 NOTE — Telephone Encounter (Signed)
Outreach made to Pt.  Scheduled for afib ablation on December 15  Labs/covid test scheduled.  Will send instructions via mychart.

## 2018-12-31 NOTE — Progress Notes (Signed)
Electrophysiology TeleHealth Note   Due to national recommendations of social distancing due to [place] 19, an audio telehealth visit is felt to be most appropriate for this patient at this time.  Verbal consent was obtained by me for the telehealth visit today.  The patient does not have capability for a virtual visit.  A phone visit is therefore required today.   Date:  12/31/2018   ID:  Kristin [name], DOB 1950/06/12, MRN [id number]  Location: patient's home  Provider location:  [facility]  Evaluation Performed: Follow-up visit  PCP:  Leamon Arnt, MD   Electrophysiologist:  Dr Rayann Heman  Chief Complaint:  AF follow up  History of Present Illness:    Kristin [name] is a 68 y.o. female who presents via telehealth conferencing today.  Since last being seen in our clinic, the patient reports doing relatively well.  She was seen recently by the AF clinic and started on Flecainide.  Today, she denies symptoms of palpitations, chest pain, shortness of breath,  lower extremity edema, dizziness, presyncope, or syncope.  The patient is otherwise without complaint today.  The patient denies symptoms of fevers, chills, cough, or new SOB worrisome for COVID 19.  Past Medical History:  Diagnosis Date  . AR (allergic rhinitis)   . Atrial fibrillation ([place])   . Clotting disorder ([place])   . Diverticulosis   . GERD (gastroesophageal reflux disease)   . Hiatal hernia   . Hiatal hernia   . Hypothyroidism   . Migraines   . Mild sleep apnea   . Osteoarthritis   . Osteopenia after menopause 06/14/2017   DEXA 08/2016 [facility]: T = -1.10 radius, -1.0 femur; lumbar spine elevated due to DJD; recheck 2-3 years.  . Pacemaker    CHB  . Paroxysmal A-fib ([place])   . PUD (peptic ulcer disease)   . Sleep apnea   . Thyroid nodule   . Transient complete heart block 1996    Past Surgical History:  Procedure Laterality Date  . HIP ARTHROPLASTY Right 2008  . PACEMAKER GENERATOR CHANGE  03/01/2007   performed by Dr Rosita Fire at [facility]  . PACEMAKER INSERTION  09/08/1994   performed in [facility] for transient complete heart block  . THYROIDECTOMY, PARTIAL  1986   BENIGN NODULES  . TOTAL HIP ARTHROPLASTY Left 02/13/2018   Procedure: TOTAL HIP ARTHROPLASTY ANTERIOR APPROACH;  Surgeon: Paralee Cancel, MD;  Location: [facility];  Service: Orthopedics;  Laterality: Left;  70 mins  . TUBAL LIGATION  1987    Current Outpatient Medications  Medication Sig Dispense Refill  . amoxicillin (AMOXIL) 500 MG capsule Take 2,000 mg by mouth See admin instructions. Before Dental Visits    . Calcium Carb-Cholecalciferol (CALCIUM/VITAMIN D) 600-400 MG-UNIT TABS Take 1 tablet by mouth daily.     . Cyanocobalamin (B-12) 1000 MCG SUBL Place 1 mL under the tongue 2 (two) times a week.    . diclofenac sodium (VOLTAREN) 1 % GEL Apply 2 g topically 4 (four) times daily as needed. 100 g 5  . dicyclomine (BENTYL) 10 MG capsule Take 1 capsule (10 mg total) by mouth 3 (three) times daily as needed (abdominal pain /IBS). 90 capsule 0  . diltiazem (CARDIZEM CD) 120 MG 24 hr capsule Take 1 capsule (120 mg total) by mouth 2 (two) times daily. 180 capsule 2  . diltiazem (CARDIZEM) 30 MG tablet Take 1 tablet every 4 hours AS NEEDED for AFIB heart rate >100 45 tablet  3  . ELIQUIS 5 MG TABS tablet TAKE 1 TABLET BY MOUTH TWICE DAILY. 180 tablet 1  . erythromycin ophthalmic ointment Place 1 application into the left eye 3 (three) times daily. 3.5 g 0  . fexofenadine (ALLEGRA) 180 MG tablet Take 180 mg by mouth daily.    . flecainide (TAMBOCOR) 50 MG tablet Take 1 tablet (50 mg total) by mouth 2 (two) times daily. 60 tablet 3  . levothyroxine (SYNTHROID) 125 MCG tablet TAKE 1 TABLET BY MOUTH DAILY. 90 tablet 0  . Magnesium 250 MG TABS Take 250 mg by mouth daily.    Marland Kitchen omeprazole (PRILOSEC) 20 MG capsule TAKE (1) CAPSULE DAILY. 90 capsule 3  . Potassium 99 MG TABS Take 1 tablet by mouth daily.    . sodium chloride  (OCEAN) 0.65 % SOLN nasal spray Place 1 spray into both nostrils as needed for congestion.    . traMADol (ULTRAM) 50 MG tablet Take 1 tablet (50 mg total) by mouth every 6 (six) hours as needed for moderate pain. (Patient taking differently: Take 50 mg by mouth as needed for moderate pain. ) 30 tablet 0  . vitamin C (ASCORBIC ACID) 500 MG tablet Take 500 mg by mouth daily.     No current facility-administered medications for this visit.     Allergies:   Morphine and related, Neomycin, and Adhesive [tape]   Social History:  The patient  reports that she has never smoked. She has never used smokeless tobacco. She reports current alcohol use of about 1.0 standard drinks of alcohol per week. She reports that she does not use drugs.   Family History:  The patient's  family history includes COPD in her brother; Cancer in her maternal grandmother; Diabetes Mellitus II in her maternal uncle; Drug abuse in her brother; Peptic Ulcer in her mother; Rheum arthritis in her mother.   ROS:  Please see the history of present illness.   All other systems are personally reviewed and negative.    Exam:    Vital Signs:  BP 126/76   Pulse 70   Ht 5\' 7"  (1.702 m)   Wt 204 lb (92.5 kg)   BMI 31.95 kg/m   Well sounding, alert and conversant, regular work of breathing   Labs/Other Tests and Data Reviewed:    Recent Labs: 10/08/2018: ALT 11; BUN 16; Creatinine, Ser 0.80; Hemoglobin 12.4; Platelets 220.0; Potassium 4.8; Sodium 139; TSH 0.59   Wt Readings from Last 3 Encounters:  12/31/18 204 lb (92.5 kg)  12/28/18 204 lb (92.5 kg)  12/26/18 205 lb 9.6 oz (93.3 kg)     Last device remote is reviewed from [place] PDF which reveals normal device function    ASSESSMENT & PLAN:    1.  Remote transient complete heart block Normal pacemaker function by last remote See PaceArt report She rarely V paces and is programmed VVI 35. She would like to consider gen change at [facility]  2.  Paroxysmal atrial  fibrillation Continue Eliquis for CHADS2VASC of 2 she has failed medical therapy with Flecainide. Therapeutic strategies for afib including medicine and ablation were discussed in detail with the patient today. Risk, benefits, and alternatives to EP study and radiofrequency ablation for afib were also discussed in detail today. These risks include but are not limited to stroke, bleeding, vascular damage, tamponade, perforation, damage to the esophagus, lungs, and other structures, pulmonary vein stenosis, worsening renal function, and death. The patient understands these risk and wishes to proceed.  We  will therefore proceed with catheter ablation at the next available time.  Carto, ICE, anesthesia are requested for the procedure.  Will also obtain cardiac CT prior to the procedure to exclude LAA thrombus and further evaluate atrial anatomy.    Follow-up:  After ablation    Patient Risk:  after full review of this patients clinical status, I feel that they are at moderate risk at this time.  Today, I have spent 15 minutes with the patient with telehealth technology discussing arrhythmia management .    Army Fossa, MD  12/31/2018 9:21 AM     [facility] [address] [place] [address] [phone] (office) [phone] (fax)

## 2018-12-31 NOTE — Telephone Encounter (Signed)
-----   Message from [name], MD sent at 12/31/2018  9:24 AM EST ----- Please schedule afib ablation with Carto/ICE/Anesthesia  Cardiac CT

## 2018-12-31 NOTE — H&P (View-Only) (Signed)
Electrophysiology TeleHealth Note   Due to national recommendations of social distancing due to [place] 19, an audio telehealth visit is felt to be most appropriate for this patient at this time.  Verbal consent was obtained by me for the telehealth visit today.  The patient does not have capability for a virtual visit.  A phone visit is therefore required today.   Date:  12/31/2018   ID:  Kristin [name], DOB 1950/12/27, MRN [id number]  Location: patient's home  Provider location:  [facility]  Evaluation Performed: Follow-up visit  PCP:  Leamon Arnt, MD   Electrophysiologist:  Dr Rayann Heman  Chief Complaint:  AF follow up  History of Present Illness:    Kristin [name] is a 68 y.o. female who presents via telehealth conferencing today.  Since last being seen in our clinic, the patient reports doing relatively well.  She was seen recently by the AF clinic and started on Flecainide.  Today, she denies symptoms of palpitations, chest pain, shortness of breath,  lower extremity edema, dizziness, presyncope, or syncope.  The patient is otherwise without complaint today.  The patient denies symptoms of fevers, chills, cough, or new SOB worrisome for COVID 19.  Past Medical History:  Diagnosis Date  . AR (allergic rhinitis)   . Atrial fibrillation ([place])   . Clotting disorder ([place])   . Diverticulosis   . GERD (gastroesophageal reflux disease)   . Hiatal hernia   . Hiatal hernia   . Hypothyroidism   . Migraines   . Mild sleep apnea   . Osteoarthritis   . Osteopenia after menopause 06/14/2017   DEXA 08/2016 [facility]: T = -1.10 radius, -1.0 femur; lumbar spine elevated due to DJD; recheck 2-3 years.  . Pacemaker    CHB  . Paroxysmal A-fib ([place])   . PUD (peptic ulcer disease)   . Sleep apnea   . Thyroid nodule   . Transient complete heart block 1996    Past Surgical History:  Procedure Laterality Date  . HIP ARTHROPLASTY Right 2008  . PACEMAKER GENERATOR CHANGE  03/01/2007   performed by Dr Rosita Fire at [facility])  . PACEMAKER INSERTION  09/08/1994   performed in [facility] for transient complete heart block  . THYROIDECTOMY, PARTIAL  1986   BENIGN NODULES  . TOTAL HIP ARTHROPLASTY Left 02/13/2018   Procedure: TOTAL HIP ARTHROPLASTY ANTERIOR APPROACH;  Surgeon: Paralee Cancel, MD;  Location: [facility];  Service: Orthopedics;  Laterality: Left;  70 mins  . TUBAL LIGATION  1987    Current Outpatient Medications  Medication Sig Dispense Refill  . amoxicillin (AMOXIL) 500 MG capsule Take 2,000 mg by mouth See admin instructions. Before Dental Visits    . Calcium Carb-Cholecalciferol (CALCIUM/VITAMIN D) 600-400 MG-UNIT TABS Take 1 tablet by mouth daily.     . Cyanocobalamin (B-12) 1000 MCG SUBL Place 1 mL under the tongue 2 (two) times a week.    . diclofenac sodium (VOLTAREN) 1 % GEL Apply 2 g topically 4 (four) times daily as needed. 100 g 5  . dicyclomine (BENTYL) 10 MG capsule Take 1 capsule (10 mg total) by mouth 3 (three) times daily as needed (abdominal pain /IBS). 90 capsule 0  . diltiazem (CARDIZEM CD) 120 MG 24 hr capsule Take 1 capsule (120 mg total) by mouth 2 (two) times daily. 180 capsule 2  . diltiazem (CARDIZEM) 30 MG tablet Take 1 tablet every 4 hours AS NEEDED for AFIB heart rate >100 45 tablet  3  . ELIQUIS 5 MG TABS tablet TAKE 1 TABLET BY MOUTH TWICE DAILY. 180 tablet 1  . erythromycin ophthalmic ointment Place 1 application into the left eye 3 (three) times daily. 3.5 g 0  . fexofenadine (ALLEGRA) 180 MG tablet Take 180 mg by mouth daily.    . flecainide (TAMBOCOR) 50 MG tablet Take 1 tablet (50 mg total) by mouth 2 (two) times daily. 60 tablet 3  . levothyroxine (SYNTHROID) 125 MCG tablet TAKE 1 TABLET BY MOUTH DAILY. 90 tablet 0  . Magnesium 250 MG TABS Take 250 mg by mouth daily.    Marland Kitchen omeprazole (PRILOSEC) 20 MG capsule TAKE (1) CAPSULE DAILY. 90 capsule 3  . Potassium 99 MG TABS Take 1 tablet by mouth daily.    . sodium chloride  (OCEAN) 0.65 % SOLN nasal spray Place 1 spray into both nostrils as needed for congestion.    . traMADol (ULTRAM) 50 MG tablet Take 1 tablet (50 mg total) by mouth every 6 (six) hours as needed for moderate pain. (Patient taking differently: Take 50 mg by mouth as needed for moderate pain. ) 30 tablet 0  . vitamin C (ASCORBIC ACID) 500 MG tablet Take 500 mg by mouth daily.     No current facility-administered medications for this visit.     Allergies:   Morphine and related, Neomycin, and Adhesive [tape]   Social History:  The patient  reports that she has never smoked. She has never used smokeless tobacco. She reports current alcohol use of about 1.0 standard drinks of alcohol per week. She reports that she does not use drugs.   Family History:  The patient's  family history includes COPD in her brother; Cancer in her maternal grandmother; Diabetes Mellitus II in her maternal uncle; Drug abuse in her brother; Peptic Ulcer in her mother; Rheum arthritis in her mother.   ROS:  Please see the history of present illness.   All other systems are personally reviewed and negative.    Exam:    Vital Signs:  BP 126/76   Pulse 70   Ht 5\' 7"  (1.702 m)   Wt 204 lb (92.5 kg)   BMI 31.95 kg/m   Well sounding, alert and conversant, regular work of breathing   Labs/Other Tests and Data Reviewed:    Recent Labs: 10/08/2018: ALT 11; BUN 16; Creatinine, Ser 0.80; Hemoglobin 12.4; Platelets 220.0; Potassium 4.8; Sodium 139; TSH 0.59   Wt Readings from Last 3 Encounters:  12/31/18 204 lb (92.5 kg)  12/28/18 204 lb (92.5 kg)  12/26/18 205 lb 9.6 oz (93.3 kg)     Last device remote is reviewed from [place] PDF which reveals normal device function    ASSESSMENT & PLAN:    1.  Remote transient complete heart block Normal pacemaker function by last remote See PaceArt report She rarely V paces and is programmed VVI 35. She would like to consider gen [facility] at [facility]  2.  Paroxysmal atrial  fibrillation Continue Eliquis for CHADS2VASC of 2 she has failed medical therapy with Flecainide. Therapeutic strategies for afib including medicine and ablation were discussed in detail with the patient today. Risk, benefits, and alternatives to EP study and radiofrequency ablation for afib were also discussed in detail today. These risks include but are not limited to stroke, bleeding, vascular damage, tamponade, perforation, damage to the esophagus, lungs, and other structures, pulmonary vein stenosis, worsening renal function, and death. The patient understands these risk and wishes to proceed.  We  will therefore proceed with catheter ablation at the next available time.  Carto, ICE, anesthesia are requested for the procedure.  Will also obtain cardiac CT prior to the procedure to exclude LAA thrombus and further evaluate atrial anatomy.    Follow-up:  After ablation    Patient Risk:  after full review of this patients clinical status, I feel that they are at moderate risk at this time.  Today, I have spent 15 minutes with the patient with telehealth technology discussing arrhythmia management .    Army Fossa, MD  12/31/2018 9:21 AM     [facility] [address] [place] [address] [phone] (office) [phone] (fax)

## 2019-01-01 ENCOUNTER — Telehealth: Payer: Self-pay

## 2019-01-01 LAB — CBC WITH DIFFERENTIAL/PLATELET
Basophils Absolute: 0.1 10*3/uL (ref 0.0–0.2)
Basos: 1 %
EOS (ABSOLUTE): 0.1 10*3/uL (ref 0.0–0.4)
Eos: 2 %
Hematocrit: 38.4 % (ref 34.0–46.6)
Hemoglobin: 12.5 g/dL (ref 11.1–15.9)
Immature Grans (Abs): 0 10*3/uL (ref 0.0–0.1)
Immature Granulocytes: 0 %
Lymphocytes Absolute: 1.3 10*3/uL (ref 0.7–3.1)
Lymphs: 22 %
MCH: 29.8 pg (ref 26.6–33.0)
MCHC: 32.6 g/dL (ref 31.5–35.7)
MCV: 92 fL (ref 79–97)
Monocytes Absolute: 0.4 10*3/uL (ref 0.1–0.9)
Monocytes: 6 %
Neutrophils Absolute: 3.9 10*3/uL (ref 1.4–7.0)
Neutrophils: 69 %
Platelets: 247 10*3/uL (ref 150–450)
RBC: 4.19 x10E6/uL (ref 3.77–5.28)
RDW: 12.8 % (ref 11.7–15.4)
WBC: 5.8 10*3/uL (ref 3.4–10.8)

## 2019-01-01 LAB — BASIC METABOLIC PANEL
BUN/Creatinine Ratio: 15 (ref 12–28)
BUN: 15 mg/dL (ref 8–27)
CO2: 22 mmol/L (ref 20–29)
Calcium: 9.4 mg/dL (ref 8.7–10.3)
Chloride: 101 mmol/L (ref 96–106)
Creatinine, Ser: 0.97 mg/dL (ref 0.57–1.00)
GFR calc Af Amer: 70 mL/min/{1.73_m2} (ref >59)
GFR calc non Af Amer: 61 mL/min/{1.73_m2} (ref >59)
Glucose: 95 mg/dL (ref 65–99)
Potassium: 4.6 mmol/L (ref 3.5–5.2)
Sodium: 140 mmol/L (ref 134–144)

## 2019-01-01 NOTE — Telephone Encounter (Signed)
Work up complete. 

## 2019-01-01 NOTE — Telephone Encounter (Signed)
  Follow up Call-  Call back number 12/28/2018  Post procedure Call Back phone  # [phone]  Permission to leave phone message Yes  Some recent data might be hidden     Patient questions:  Do you have a fever, pain , or abdominal swelling? No. Pain Score  0 *  Have you tolerated food without any problems? Yes.    Have you been able to return to your normal activities? Yes.    Do you have any questions about your discharge instructions: Diet   No. Medications  No. Follow up visit  No.  Do you have questions or concerns about your Care? No.  Actions: * If pain score is 4 or above: No action needed, pain <4. 1. Have you developed a fever since your procedure? no  2.   Have you had an respiratory symptoms (SOB or cough) since your procedure? no  3.   Have you tested positive for COVID 19 since your procedure no  4.   Have you had any family members/close contacts diagnosed with the COVID 19 since your procedure?  no   If yes to any of these questions please route to Joylene John, RN and Alphonsa Gin, Therapist, sports.

## 2019-01-02 ENCOUNTER — Encounter (HOSPITAL_COMMUNITY): Payer: Self-pay

## 2019-01-02 ENCOUNTER — Other Ambulatory Visit: Payer: Self-pay

## 2019-01-02 ENCOUNTER — Ambulatory Visit (HOSPITAL_COMMUNITY)
Admission: RE | Admit: 2019-01-02 | Discharge: 2019-01-02 | Disposition: A | Discharge status: Home / Self Care | Payer: Medicare Other | Source: Ambulatory Visit | Attending: Physician Assistant | Admitting: Physician Assistant

## 2019-01-02 VITALS — BP 106/76 | HR 70

## 2019-01-02 DIAGNOSIS — I4891 Unspecified atrial fibrillation: Secondary | ICD-10-CM | POA: Diagnosis not present

## 2019-01-02 DIAGNOSIS — I4510 Unspecified right bundle-branch block: Secondary | ICD-10-CM | POA: Insufficient documentation

## 2019-01-02 DIAGNOSIS — I480 Paroxysmal atrial fibrillation: Secondary | ICD-10-CM

## 2019-01-02 NOTE — Progress Notes (Signed)
Patient presents for ECG after starting flecainide. ECG shows SR HR 70, inc RBBB, PR 174, QRS 112,QTc 460. Continue present therapy. Patient did miss one dose of Eliquis 12/31/18, discussed with Dr Rayann Heman, [place] for cardiac CT and ablation as scheduled.

## 2019-01-03 ENCOUNTER — Telehealth (HOSPITAL_COMMUNITY): Payer: Self-pay | Admitting: Emergency Medicine

## 2019-01-03 NOTE — Telephone Encounter (Signed)
Reaching out to patient to offer assistance regarding upcoming cardiac imaging study; pt verbalizes understanding of appt date/time, parking situation and where to check in, pre-test NPO status and medications ordered, and verified current allergies; name and call back number provided for further questions should they arise Drea Jurewicz RN Navigator Cardiac Imaging [place] [facility] [phone] office [phone] cell 

## 2019-01-04 ENCOUNTER — Other Ambulatory Visit (HOSPITAL_COMMUNITY): Payer: Medicare Other

## 2019-01-04 ENCOUNTER — Other Ambulatory Visit (HOSPITAL_COMMUNITY)
Admission: RE | Admit: 2019-01-04 | Discharge: 2019-01-04 | Disposition: A | Discharge status: Home / Self Care | Payer: Medicare Other | Source: Ambulatory Visit | Attending: Internal Medicine | Admitting: Internal Medicine

## 2019-01-04 ENCOUNTER — Other Ambulatory Visit: Payer: Self-pay

## 2019-01-04 ENCOUNTER — Ambulatory Visit (HOSPITAL_COMMUNITY)
Admission: RE | Admit: 2019-01-04 | Discharge: 2019-01-04 | Disposition: A | Discharge status: Home / Self Care | Payer: Medicare Other | Source: Ambulatory Visit | Attending: Internal Medicine | Admitting: Internal Medicine

## 2019-01-04 DIAGNOSIS — Z20828 Contact with and (suspected) exposure to other viral communicable diseases: Secondary | ICD-10-CM | POA: Insufficient documentation

## 2019-01-04 DIAGNOSIS — Z01818 Encounter for other preprocedural examination: Secondary | ICD-10-CM | POA: Diagnosis not present

## 2019-01-04 DIAGNOSIS — I480 Paroxysmal atrial fibrillation: Secondary | ICD-10-CM | POA: Insufficient documentation

## 2019-01-04 MED ORDER — IOHEXOL 350 MG/ML SOLN
80.0000 mL | Freq: Once | INTRAVENOUS | Status: AC | PRN
  Administered 2019-01-04: 80 mL via INTRAVENOUS

## 2019-01-05 LAB — NOVEL CORONAVIRUS, NAA (HOSP ORDER, SEND-OUT TO REF LAB; TAT 18-24 HRS): SARS-CoV-2, NAA: NOT DETECTED

## 2019-01-08 ENCOUNTER — Encounter (HOSPITAL_COMMUNITY): Payer: Self-pay | Admitting: Internal Medicine

## 2019-01-08 ENCOUNTER — Ambulatory Visit (HOSPITAL_COMMUNITY)
Admission: RE | Admit: 2019-01-08 | Discharge: 2019-01-08 | Disposition: A | Discharge status: Home / Self Care | Payer: Medicare Other | Attending: Internal Medicine | Admitting: Internal Medicine

## 2019-01-08 ENCOUNTER — Ambulatory Visit (HOSPITAL_COMMUNITY): Payer: Medicare Other | Admitting: Certified Registered"

## 2019-01-08 ENCOUNTER — Other Ambulatory Visit: Payer: Self-pay

## 2019-01-08 ENCOUNTER — Encounter (HOSPITAL_COMMUNITY)
Admission: RE | Disposition: A | Discharge status: Home / Self Care | Payer: Self-pay | Source: Home / Self Care | Attending: Internal Medicine

## 2019-01-08 DIAGNOSIS — Z79899 Other long term (current) drug therapy: Secondary | ICD-10-CM | POA: Diagnosis not present

## 2019-01-08 DIAGNOSIS — Z79890 Hormone replacement therapy (postmenopausal): Secondary | ICD-10-CM | POA: Insufficient documentation

## 2019-01-08 DIAGNOSIS — G4730 Sleep apnea, unspecified: Secondary | ICD-10-CM | POA: Insufficient documentation

## 2019-01-08 DIAGNOSIS — Z7901 Long term (current) use of anticoagulants: Secondary | ICD-10-CM | POA: Insufficient documentation

## 2019-01-08 DIAGNOSIS — E039 Hypothyroidism, unspecified: Secondary | ICD-10-CM | POA: Diagnosis not present

## 2019-01-08 DIAGNOSIS — Z885 Allergy status to narcotic agent status: Secondary | ICD-10-CM | POA: Insufficient documentation

## 2019-01-08 DIAGNOSIS — M8580 Other specified disorders of bone density and structure, unspecified site: Secondary | ICD-10-CM | POA: Insufficient documentation

## 2019-01-08 DIAGNOSIS — K219 Gastro-esophageal reflux disease without esophagitis: Secondary | ICD-10-CM | POA: Insufficient documentation

## 2019-01-08 DIAGNOSIS — Z8711 Personal history of peptic ulcer disease: Secondary | ICD-10-CM | POA: Diagnosis not present

## 2019-01-08 DIAGNOSIS — I480 Paroxysmal atrial fibrillation: Secondary | ICD-10-CM | POA: Diagnosis not present

## 2019-01-08 DIAGNOSIS — Z888 Allergy status to other drugs, medicaments and biological substances status: Secondary | ICD-10-CM | POA: Diagnosis not present

## 2019-01-08 DIAGNOSIS — I442 Atrioventricular block, complete: Secondary | ICD-10-CM | POA: Insufficient documentation

## 2019-01-08 DIAGNOSIS — M1990 Unspecified osteoarthritis, unspecified site: Secondary | ICD-10-CM | POA: Diagnosis not present

## 2019-01-08 HISTORY — PX: ATRIAL FIBRILLATION ABLATION: EP1191

## 2019-01-08 LAB — POCT ACTIVATED CLOTTING TIME: Activated Clotting Time: 274 seconds

## 2019-01-08 SURGERY — ATRIAL FIBRILLATION ABLATION
Anesthesia: General

## 2019-01-08 MED ORDER — PROTAMINE SULFATE 10 MG/ML IV SOLN
INTRAVENOUS | Status: DC | PRN
  Administered 2019-01-08: 40 mg via INTRAVENOUS

## 2019-01-08 MED ORDER — SODIUM CHLORIDE 0.9 % IV SOLN: INTRAVENOUS | Status: DC

## 2019-01-08 MED ORDER — ROCURONIUM BROMIDE 10 MG/ML (PF) SYRINGE
PREFILLED_SYRINGE | INTRAVENOUS | Status: DC | PRN
  Administered 2019-01-08: 50 mg via INTRAVENOUS
  Administered 2019-01-08 (×2): 20 mg via INTRAVENOUS

## 2019-01-08 MED ORDER — ISOPROTERENOL HCL 0.2 MG/ML IJ SOLN
INTRAVENOUS | Status: DC | PRN
  Administered 2019-01-08: 20 ug/min via INTRAVENOUS

## 2019-01-08 MED ORDER — LIDOCAINE 2% (20 MG/ML) 5 ML SYRINGE
INTRAMUSCULAR | Status: DC | PRN
  Administered 2019-01-08: 60 mg via INTRAVENOUS

## 2019-01-08 MED ORDER — HEPARIN SODIUM (PORCINE) 1000 UNIT/ML IJ SOLN
INTRAMUSCULAR | Status: DC | PRN
  Administered 2019-01-08: 4000 [IU] via INTRAVENOUS

## 2019-01-08 MED ORDER — SODIUM CHLORIDE 0.9 % IV SOLN: 250.0000 mL | INTRAVENOUS | Status: DC | PRN

## 2019-01-08 MED ORDER — FENTANYL CITRATE (PF) 100 MCG/2ML IJ SOLN
INTRAMUSCULAR | Status: DC | PRN
  Administered 2019-01-08: 50 ug via INTRAVENOUS

## 2019-01-08 MED ORDER — DEXAMETHASONE SODIUM PHOSPHATE 10 MG/ML IJ SOLN
INTRAMUSCULAR | Status: DC | PRN
  Administered 2019-01-08: 5 mg via INTRAVENOUS

## 2019-01-08 MED ORDER — PHENYLEPHRINE HCL-NACL 10-0.9 MG/250ML-% IV SOLN
INTRAVENOUS | Status: DC | PRN
  Administered 2019-01-08: 20 ug/min via INTRAVENOUS

## 2019-01-08 MED ORDER — APIXABAN 5 MG PO TABS
5.0000 mg | ORAL_TABLET | Freq: Once | ORAL | Status: AC
  Administered 2019-01-08: 5 mg via ORAL
  Filled 2019-01-08: qty 1

## 2019-01-08 MED ORDER — ACETAMINOPHEN 325 MG PO TABS: 650.0000 mg | ORAL_TABLET | ORAL | Status: DC | PRN

## 2019-01-08 MED ORDER — HEPARIN SODIUM (PORCINE) 1000 UNIT/ML IJ SOLN
INTRAMUSCULAR | Status: AC
  Filled 2019-01-08: qty 1

## 2019-01-08 MED ORDER — HEPARIN (PORCINE) IN NACL 1000-0.9 UT/500ML-% IV SOLN
INTRAVENOUS | Status: DC | PRN
  Administered 2019-01-08: 500 mL

## 2019-01-08 MED ORDER — MIDAZOLAM HCL 2 MG/2ML IJ SOLN
INTRAMUSCULAR | Status: DC | PRN
  Administered 2019-01-08: 2 mg via INTRAVENOUS

## 2019-01-08 MED ORDER — PROPOFOL 10 MG/ML IV BOLUS
INTRAVENOUS | Status: DC | PRN
  Administered 2019-01-08: 140 mg via INTRAVENOUS

## 2019-01-08 MED ORDER — ISOPROTERENOL HCL 0.2 MG/ML IJ SOLN
INTRAMUSCULAR | Status: AC
  Filled 2019-01-08: qty 5

## 2019-01-08 MED ORDER — HEPARIN (PORCINE) IN NACL 1000-0.9 UT/500ML-% IV SOLN
INTRAVENOUS | Status: AC
  Filled 2019-01-08: qty 500

## 2019-01-08 MED ORDER — SODIUM CHLORIDE 0.9% FLUSH: 3.0000 mL | Freq: Two times a day (BID) | INTRAVENOUS | Status: DC

## 2019-01-08 MED ORDER — ONDANSETRON HCL 4 MG/2ML IJ SOLN: 4.0000 mg | Freq: Four times a day (QID) | INTRAMUSCULAR | Status: DC | PRN

## 2019-01-08 MED ORDER — SODIUM CHLORIDE 0.9% FLUSH: 3.0000 mL | INTRAVENOUS | Status: DC | PRN

## 2019-01-08 MED ORDER — HEPARIN SODIUM (PORCINE) 1000 UNIT/ML IJ SOLN
INTRAMUSCULAR | Status: DC | PRN
  Administered 2019-01-08: 1000 [IU] via INTRAVENOUS
  Administered 2019-01-08: 14000 [IU]

## 2019-01-08 MED ORDER — ONDANSETRON HCL 4 MG/2ML IJ SOLN
INTRAMUSCULAR | Status: DC | PRN
  Administered 2019-01-08: 4 mg via INTRAVENOUS

## 2019-01-08 MED ORDER — SUGAMMADEX SODIUM 200 MG/2ML IV SOLN
INTRAVENOUS | Status: DC | PRN
  Administered 2019-01-08 (×2): 180 mg via INTRAVENOUS

## 2019-01-08 SURGICAL SUPPLY — 17 items
BLANKET WARM UNDERBOD FULL ACC (MISCELLANEOUS) ×2 IMPLANT
CATH MAPPNG PENTARAY F 2-6-2MM (CATHETERS) ×1 IMPLANT
CATH SMTCH THERMOCOOL SF DF (CATHETERS) ×2 IMPLANT
CATH SOUNDSTAR ECO REPROCESSED (CATHETERS) ×2 IMPLANT
CATH WEBSTER BI DIR CS D-F CRV (CATHETERS) ×2 IMPLANT
COVER SWIFTLINK CONNECTOR (BAG) ×2 IMPLANT
NEEDLE BAYLIS TRANSSEPTAL 71CM (NEEDLE) ×2 IMPLANT
PACK EP LATEX FREE (CUSTOM PROCEDURE TRAY) ×1
PACK EP LF (CUSTOM PROCEDURE TRAY) ×1 IMPLANT
PAD PRO RADIOLUCENT 2001M-C (PAD) ×2 IMPLANT
PATCH CARTO3 (PAD) ×2 IMPLANT
PENTARAY F 2-6-2MM (CATHETERS) ×2
SHEATH AVANTI 11F 11CM (SHEATH) ×2 IMPLANT
SHEATH PINNACLE 7F 10CM (SHEATH) ×4 IMPLANT
SHEATH PROBE COVER 6X72 (BAG) ×2 IMPLANT
SHEATH SWARTZ TS SL2 63CM 8.5F (SHEATH) ×2 IMPLANT
TUBING SMART ABLATE COOLFLOW (TUBING) ×2 IMPLANT

## 2019-01-08 NOTE — Discharge Instructions (Signed)

## 2019-01-08 NOTE — Anesthesia Preprocedure Evaluation (Addendum)
Anesthesia Evaluation  Patient identified by MRN, date of birth, ID band Patient awake    Reviewed: Allergy & Precautions, NPO status , Patient's Chart, lab work & pertinent test results  History of Anesthesia Complications Negative for: history of anesthetic complications  Airway Mallampati: II  TM Distance: >3 FB Neck ROM: Full    Dental  (+) Teeth Intact   Pulmonary sleep apnea ,    Pulmonary exam normal        Cardiovascular Normal cardiovascular exam+ dysrhythmias Atrial Fibrillation + pacemaker (CHB) + Valvular Problems/Murmurs MVP      Neuro/Psych negative neurological ROS  negative psych ROS   GI/Hepatic Neg liver ROS, hiatal hernia, PUD, GERD  ,  Endo/Other  Hypothyroidism   Renal/GU negative Renal ROS  negative genitourinary   Musculoskeletal negative musculoskeletal ROS (+)   Abdominal   Peds  Hematology negative hematology ROS (+)   Anesthesia Other Findings Echo 07/24/18: EF 60-65%, normal RV function, mild MVP  Reproductive/Obstetrics                            Anesthesia Physical Anesthesia Plan  ASA: III  Anesthesia Plan: General   Post-op Pain Management:    Induction: Intravenous  PONV Risk Score and Plan: 3 and Ondansetron, Dexamethasone, Treatment may vary due to age or medical condition and Midazolam  Airway Management Planned: Oral ETT  Additional Equipment: None  Intra-op Plan:   Post-operative Plan: Extubation in OR  Informed Consent: I have reviewed the patients History and Physical, chart, labs and discussed the procedure including the risks, benefits and alternatives for the proposed anesthesia with the patient or authorized representative who has indicated his/her understanding and acceptance.     Dental advisory given  Plan Discussed with:   Anesthesia Plan Comments:        Anesthesia Quick Evaluation

## 2019-01-08 NOTE — Progress Notes (Signed)
Rt groin 3 way stopcock removed and suture cut. No bleeding nor hematoma. Dressed w/gauze and tegaderm. Instructions reviewed w/patient.

## 2019-01-08 NOTE — Interval H&P Note (Signed)
History and Physical Interval Note:  01/08/2019 10:04 AM  Kristin [name]  has presented today for surgery, with the diagnosis of atrial fibrillation.  The various methods of treatment have been discussed with the patient and family. After consideration of risks, benefits and other options for treatment, the patient has consented to  Procedure(s): ATRIAL FIBRILLATION ABLATION (N/A) as a surgical intervention.  The patient's history has been reviewed, patient examined, no change in status, stable for surgery.  I have reviewed the patient's chart and labs.  Questions were answered to the patient's satisfaction.    Cardiac CT reviewed with her today.  She reports compliance with eliquis without interruption   [name]

## 2019-01-08 NOTE — Transfer of Care (Signed)
Immediate Anesthesia Transfer of Care Note  Patient: Kristin [name]  Procedure(s) Performed: ATRIAL FIBRILLATION ABLATION (N/A )  Patient Location: Cath Lab  Anesthesia Type:General  Level of Consciousness: awake, alert  and oriented  Airway & Oxygen Therapy: Patient Spontanous Breathing  Post-op Assessment: Report given to RN  Post vital signs: Reviewed and stable  Last Vitals:  Vitals Value Taken Time  BP    Temp    Pulse    Resp    SpO2      Last Pain:  Vitals:   01/08/19 0911  TempSrc:   PainSc: 0-No pain      Patients Stated Pain Goal: 3 ([id number])  Complications: No apparent anesthesia complications

## 2019-01-08 NOTE — Anesthesia Procedure Notes (Signed)

## 2019-01-08 NOTE — Anesthesia Postprocedure Evaluation (Signed)
Anesthesia Post Note  Patient: Kristin [name]  Procedure(s) Performed: ATRIAL FIBRILLATION ABLATION (N/A )     Anesthesia Type: General Level of consciousness: awake and alert Pain management: pain level controlled Vital Signs Assessment: post-procedure vital signs reviewed and stable Respiratory status: spontaneous breathing, nonlabored ventilation and respiratory function stable Cardiovascular status: blood pressure returned to baseline and stable Postop Assessment: no apparent nausea or vomiting Anesthetic complications: no    Last Vitals:  Vitals:   01/08/19 1355 01/08/19 1406  BP: 120/84   Pulse: 65   Resp: 12   Temp:  36.5 C  SpO2: 100%     Last Pain:  Vitals:   01/08/19 1421  TempSrc:   PainSc: 0-No pain                 Lidia Collum

## 2019-01-08 NOTE — Progress Notes (Signed)
Remote pacemaker transmission.   

## 2019-01-22 ENCOUNTER — Telehealth (HOSPITAL_COMMUNITY): Payer: Self-pay | Admitting: *Deleted

## 2019-01-22 NOTE — Telephone Encounter (Signed)
Pt called to report her HR has been 110 all day, she states since the ablation on 12/15 it has been running 70-80s however when she awoke today it felt fast and was at 110 where it has stayed all day, and even jumps up higher with activity.  She states it does not feel like her normal a-fib symptoms, just feels "fast"  She states BP was 138/90 and 125/80.  She states she is taking all meds as prescribed.  Discussed w/Donna Kayleen Memos, NP she advises pt can take her prn Dilt 30 mg q4 hours for HR >100 as long as SBP is also >100.  Call us back tomorrow if HR not slowed down for appt.  Pt is aware, agreeable and verbalized understanding.

## 2019-01-23 ENCOUNTER — Encounter (HOSPITAL_COMMUNITY): Payer: Self-pay | Admitting: Nurse Practitioner

## 2019-01-23 ENCOUNTER — Other Ambulatory Visit: Payer: Self-pay

## 2019-01-23 ENCOUNTER — Ambulatory Visit (HOSPITAL_COMMUNITY)
Admission: RE | Admit: 2019-01-23 | Discharge: 2019-01-23 | Disposition: A | Discharge status: Home / Self Care | Payer: Medicare Other | Source: Ambulatory Visit | Attending: Nurse Practitioner | Admitting: Nurse Practitioner

## 2019-01-23 ENCOUNTER — Telehealth (HOSPITAL_COMMUNITY): Payer: Self-pay | Admitting: *Deleted

## 2019-01-23 VITALS — BP 132/94 | HR 116 | Ht 67.0 in | Wt 206.8 lb

## 2019-01-23 DIAGNOSIS — M8580 Other specified disorders of bone density and structure, unspecified site: Secondary | ICD-10-CM | POA: Insufficient documentation

## 2019-01-23 DIAGNOSIS — Z79899 Other long term (current) drug therapy: Secondary | ICD-10-CM | POA: Insufficient documentation

## 2019-01-23 DIAGNOSIS — Z881 Allergy status to other antibiotic agents status: Secondary | ICD-10-CM | POA: Insufficient documentation

## 2019-01-23 DIAGNOSIS — I480 Paroxysmal atrial fibrillation: Secondary | ICD-10-CM | POA: Diagnosis present

## 2019-01-23 DIAGNOSIS — Z79890 Hormone replacement therapy (postmenopausal): Secondary | ICD-10-CM | POA: Insufficient documentation

## 2019-01-23 DIAGNOSIS — Z950 Presence of cardiac pacemaker: Secondary | ICD-10-CM | POA: Diagnosis not present

## 2019-01-23 DIAGNOSIS — Z9851 Tubal ligation status: Secondary | ICD-10-CM | POA: Diagnosis not present

## 2019-01-23 DIAGNOSIS — Z7901 Long term (current) use of anticoagulants: Secondary | ICD-10-CM | POA: Insufficient documentation

## 2019-01-23 DIAGNOSIS — M1990 Unspecified osteoarthritis, unspecified site: Secondary | ICD-10-CM | POA: Diagnosis not present

## 2019-01-23 DIAGNOSIS — Z888 Allergy status to other drugs, medicaments and biological substances status: Secondary | ICD-10-CM | POA: Insufficient documentation

## 2019-01-23 DIAGNOSIS — Z791 Long term (current) use of non-steroidal anti-inflammatories (NSAID): Secondary | ICD-10-CM | POA: Insufficient documentation

## 2019-01-23 DIAGNOSIS — Z96642 Presence of left artificial hip joint: Secondary | ICD-10-CM | POA: Insufficient documentation

## 2019-01-23 DIAGNOSIS — Z886 Allergy status to analgesic agent status: Secondary | ICD-10-CM | POA: Insufficient documentation

## 2019-01-23 DIAGNOSIS — Z8049 Family history of malignant neoplasm of other genital organs: Secondary | ICD-10-CM | POA: Insufficient documentation

## 2019-01-23 DIAGNOSIS — G4730 Sleep apnea, unspecified: Secondary | ICD-10-CM | POA: Diagnosis not present

## 2019-01-23 DIAGNOSIS — K219 Gastro-esophageal reflux disease without esophagitis: Secondary | ICD-10-CM | POA: Diagnosis not present

## 2019-01-23 DIAGNOSIS — D6869 Other thrombophilia: Secondary | ICD-10-CM

## 2019-01-23 DIAGNOSIS — R9431 Abnormal electrocardiogram [ECG] [EKG]: Secondary | ICD-10-CM | POA: Diagnosis not present

## 2019-01-23 DIAGNOSIS — E039 Hypothyroidism, unspecified: Secondary | ICD-10-CM | POA: Insufficient documentation

## 2019-01-23 DIAGNOSIS — Z833 Family history of diabetes mellitus: Secondary | ICD-10-CM | POA: Insufficient documentation

## 2019-01-23 DIAGNOSIS — I4892 Unspecified atrial flutter: Secondary | ICD-10-CM | POA: Diagnosis not present

## 2019-01-23 DIAGNOSIS — I442 Atrioventricular block, complete: Secondary | ICD-10-CM | POA: Insufficient documentation

## 2019-01-23 NOTE — Telephone Encounter (Signed)
Pt called to let us know she took the extra Dilt yesterday and this morning she took an extra 120 mg tab and her HR is still over 100.  Appt sch for 3 pm today

## 2019-01-23 NOTE — Patient Instructions (Addendum)
Tonight take Flecanide 100mg   Tomorrow take 100mg  in the am Take usual dose of Cardizem   Scheduled tomorrow for appt at 12pm.

## 2019-01-23 NOTE — Progress Notes (Signed)
Primary Care Physician: Leamon Arnt, MD Referring Physician: Dr. Chancy Hurter is a 67 y.o. female with a h/o paroxysmal afib that is in the afib clinic at pt's request as she went out of rhythm yesterday am. She  had an ablation 12/15. She  denies any shortness of breath, no extra  fluid. No triggers that she is aware of. She has taken extra 30 mg Cardizem yesterday and an extra 120 mg Cardizem this am but she persists in atrial flutter. This does not feel as uncomfortable to her that the afib did prior to ablation. She  continues on eliquis 5 mg bid for a CHA2DS2VASc score of at least 2.  She continues on her flecainide 50 mg bid. No swallowing or groin issues. EKG shows atrial flutter at 118 bpm.    Today, she denies symptoms of palpitations, chest pain, shortness of breath, orthopnea, PND, lower extremity edema, dizziness, presyncope, syncope, or neurologic sequela. The patient is tolerating medications without difficulties and is otherwise without complaint today.   Past Medical History:  Diagnosis Date  . AR (allergic rhinitis)   . Atrial fibrillation ([place])   . Clotting disorder ([place])   . Diverticulosis   . GERD (gastroesophageal reflux disease)   . Hiatal hernia   . Hiatal hernia   . Hypothyroidism   . Migraines   . Mild sleep apnea   . Osteoarthritis   . Osteopenia after menopause 06/14/2017   DEXA 08/2016 [facility]: T = -1.10 radius, -1.0 femur; lumbar spine elevated due to DJD; recheck 2-3 years.  . Pacemaker    CHB  . Paroxysmal A-fib ([place])   . PUD (peptic ulcer disease)   . Sleep apnea   . Thyroid nodule   . Transient complete heart block 1996   Past Surgical History:  Procedure Laterality Date  . ATRIAL FIBRILLATION ABLATION N/A 01/08/2019   Procedure: ATRIAL FIBRILLATION ABLATION;  Surgeon: [name], MD;  Location: [facility];  Service: Cardiovascular;  Laterality: N/A;  . HIP ARTHROPLASTY Right 2008  . PACEMAKER GENERATOR CHANGE   03/01/2007   performed by Dr Rosita Fire at [facility])  . PACEMAKER INSERTION  09/08/1994   performed in [facility] for transient complete heart block  . THYROIDECTOMY, PARTIAL  1986   BENIGN NODULES  . TOTAL HIP ARTHROPLASTY Left 02/13/2018   Procedure: TOTAL HIP ARTHROPLASTY ANTERIOR APPROACH;  Surgeon: Paralee Cancel, MD;  Location: [facility];  Service: Orthopedics;  Laterality: Left;  70 mins  . TUBAL LIGATION  1987    Current Outpatient Medications  Medication Sig Dispense Refill  . amoxicillin (AMOXIL) 500 MG capsule Take 2,000 mg by mouth See admin instructions. Take 4 capsules (2000 mg) by mouth 1 hour before dental visits    . Calcium Carb-Cholecalciferol (CALCIUM/VITAMIN D) 600-400 MG-UNIT TABS Take 1 tablet by mouth daily.     . Cyanocobalamin (B-12) 1000 MCG SUBL Place 1 mL under the tongue once a week.     . diclofenac sodium (VOLTAREN) 1 % GEL Apply 2 g topically 4 (four) times daily as needed. (Patient taking differently: Apply 2 g topically 4 (four) times daily as needed (pain.). ) 100 g 5  . diltiazem (CARDIZEM CD) 120 MG 24 hr capsule Take 1 capsule (120 mg total) by mouth 2 (two) times daily. 180 capsule 2  . diltiazem (CARDIZEM) 30 MG tablet Take 1 tablet every 4 hours AS NEEDED for AFIB heart rate >100 45 tablet 3  .  ELIQUIS 5 MG TABS tablet TAKE 1 TABLET BY MOUTH TWICE DAILY. 180 tablet 1  . fexofenadine (ALLEGRA) 180 MG tablet Take 180 mg by mouth every evening.     . flecainide (TAMBOCOR) 50 MG tablet Take 1 tablet (50 mg total) by mouth 2 (two) times daily. 60 tablet 3  . levothyroxine (SYNTHROID) 125 MCG tablet TAKE 1 TABLET BY MOUTH DAILY. (Patient taking differently: Take 125 mcg by mouth daily before breakfast. ) 90 tablet 0  . Magnesium 250 MG TABS Take 250 mg by mouth every evening.     Marland Kitchen omeprazole (PRILOSEC) 20 MG capsule TAKE (1) CAPSULE DAILY. (Patient taking differently: Take 20 mg by mouth daily before breakfast. ) 90 capsule 3  . Potassium 99 MG  TABS Take 99 mg by mouth daily.     . sodium chloride (OCEAN) 0.65 % SOLN nasal spray Place 1 spray into both nostrils 2 (two) times daily.     . traMADol (ULTRAM) 50 MG tablet Take 1 tablet (50 mg total) by mouth every 6 (six) hours as needed for moderate pain. (Patient taking differently: Take 50 mg by mouth every 12 (twelve) hours as needed for moderate pain. ) 30 tablet 0   No current facility-administered medications for this encounter.    Allergies  Allergen Reactions  . Morphine And Related Hives and Itching  . Neomycin Other (See Comments)    itching   . Adhesive [Tape] Rash    Blistering rash     Social History   Socioeconomic History  . Marital status: Married    Spouse name: Not on file  . Number of children: 0  . Years of education: Not on file  . Highest education level: Not on file  Occupational History  . Occupation: retired  Tobacco Use  . Smoking status: Never Smoker  . Smokeless tobacco: Never Used  Substance and Sexual Activity  . Alcohol use: Not Currently    Alcohol/week: 1.0 standard drinks    Types: 1 Glasses of wine per week    Comment: occasionally  . Drug use: No  . Sexual activity: Not Currently  Other Topics Concern  . 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 Difficulty of Paying Living Expenses: Not on file  Food Insecurity:   . Worried About Charity fundraiser in the Last Year: Not on file  . Ran Out of Food in the Last Year: Not on file  Transportation Needs:   . Lack of Transportation (Medical): Not on file  . Lack of Transportation (Non-Medical): Not on file  Physical Activity:   . Days of Exercise per Week: Not on file  . Minutes of Exercise per Session: Not on file  Stress:   . Feeling of Stress : Not on file  Social Connections:   . Frequency of Communication with Friends and Family: Not  on file  . Frequency of Social Gatherings with Friends and Family: Not on file  . Attends Religious Services: Not on file  . Active Member of Clubs or Organizations: Not on file  . Attends Archivist Meetings: Not on file  . Marital Status: Not on file  Intimate Partner Violence:   . Fear of Current or Ex-Partner: Not on file  . Emotionally Abused: Not on file  . Physically Abused: Not on file  .  Sexually Abused: Not on file    Family History  Problem Relation Age of Onset  . Diabetes Mellitus II Maternal Uncle   . Cancer Maternal Grandmother        ENDOMETRIAL  . Rheum arthritis Mother   . Peptic Ulcer Mother   . COPD Brother   . Drug abuse Brother   . Breast cancer Neg Hx     ROS- All systems are reviewed and negative except as per the HPI above  Physical Exam: Vitals:   01/23/19 1452  BP: (!) 132/94  Pulse: (!) 116  Weight: 93.8 kg  Height: 5\' 7"  (1.702 m)   Wt Readings from Last 3 Encounters:  01/23/19 93.8 kg  01/08/19 90.7 kg  12/31/18 92.5 kg    Labs: Lab Results  Component Value Date   NA 140 12/31/2018   K 4.6 12/31/2018   CL 101 12/31/2018   CO2 22 12/31/2018   GLUCOSE 95 12/31/2018   BUN 15 12/31/2018   CREATININE 0.97 12/31/2018   CALCIUM 9.4 12/31/2018   Lab Results  Component Value Date   INR 0.87 02/13/2018   Lab Results  Component Value Date   CHOL 187 10/08/2018   HDL 73.50 10/08/2018   [place] 95 10/08/2018   TRIG 93.0 10/08/2018     GEN- The patient is well appearing, alert and oriented x 3 today.   Head- normocephalic, atraumatic Eyes-  Sclera clear, conjunctiva pink Ears- hearing intact Oropharynx- clear Neck- supple, no JVP Lymph- no cervical lymphadenopathy Lungs- Clear to ausculation bilaterally, normal work of breathing Heart- Regular rate and rhythm, no murmurs, rubs or gallops, PMI not laterally displaced GI- soft, NT, ND, + BS Extremities- no clubbing, cyanosis, or edema MS- no significant deformity or  atrophy Skin- no rash or lesion Psych- euthymic mood, full affect Neuro- strength and sensation are intact  EKG-EKG reads stemi but does not fit clinical picture with  the pt comfortable without any chest pain. . I think the flutter waves appear like st elevation.  I think the more likely reading  is atrial flutter at 116 bpm.   Epic records reviewed.     Assessment and Plan: 1. afib S/p ablation  12/15 Now in atrial flutter.  Extra CCB has not helped to return to SR Will increase flecainide to 100 mg tonight and in am Continue usual  Cardizem dose I will see back tomorrow to further assess rhythm  2. CHA2DS2VASc score of 2 Continue eliquis 5 mg bid   Geroge Baseman. Lether Tesch, [facility] [address] [place], [place] [address] [phone]

## 2019-01-24 ENCOUNTER — Inpatient Hospital Stay (HOSPITAL_COMMUNITY): Admission: RE | Admit: 2019-01-24 | Payer: Medicare Other | Source: Ambulatory Visit

## 2019-01-24 ENCOUNTER — Other Ambulatory Visit (HOSPITAL_COMMUNITY): Payer: Self-pay | Admitting: *Deleted

## 2019-01-24 ENCOUNTER — Ambulatory Visit (HOSPITAL_COMMUNITY)
Admission: RE | Admit: 2019-01-24 | Discharge: 2019-01-24 | Disposition: A | Discharge status: Home / Self Care | Payer: Medicare Other | Source: Ambulatory Visit | Attending: Nurse Practitioner | Admitting: Nurse Practitioner

## 2019-01-24 VITALS — BP 108/80 | HR 104 | Ht 67.0 in

## 2019-01-24 DIAGNOSIS — Z20828 Contact with and (suspected) exposure to other viral communicable diseases: Secondary | ICD-10-CM | POA: Insufficient documentation

## 2019-01-24 DIAGNOSIS — E039 Hypothyroidism, unspecified: Secondary | ICD-10-CM | POA: Diagnosis not present

## 2019-01-24 DIAGNOSIS — Z79899 Other long term (current) drug therapy: Secondary | ICD-10-CM | POA: Diagnosis not present

## 2019-01-24 DIAGNOSIS — I4892 Unspecified atrial flutter: Secondary | ICD-10-CM | POA: Diagnosis not present

## 2019-01-24 DIAGNOSIS — Z7901 Long term (current) use of anticoagulants: Secondary | ICD-10-CM | POA: Insufficient documentation

## 2019-01-24 DIAGNOSIS — M1990 Unspecified osteoarthritis, unspecified site: Secondary | ICD-10-CM | POA: Insufficient documentation

## 2019-01-24 DIAGNOSIS — K219 Gastro-esophageal reflux disease without esophagitis: Secondary | ICD-10-CM | POA: Diagnosis not present

## 2019-01-24 DIAGNOSIS — Z01818 Encounter for other preprocedural examination: Secondary | ICD-10-CM | POA: Diagnosis not present

## 2019-01-24 DIAGNOSIS — I4819 Other persistent atrial fibrillation: Secondary | ICD-10-CM | POA: Diagnosis not present

## 2019-01-24 DIAGNOSIS — M8588 Other specified disorders of bone density and structure, other site: Secondary | ICD-10-CM | POA: Diagnosis not present

## 2019-01-24 DIAGNOSIS — D6869 Other thrombophilia: Secondary | ICD-10-CM | POA: Diagnosis not present

## 2019-01-24 DIAGNOSIS — K449 Diaphragmatic hernia without obstruction or gangrene: Secondary | ICD-10-CM | POA: Insufficient documentation

## 2019-01-24 LAB — CBC
HCT: 41.3 % (ref 36.0–46.0)
Hemoglobin: 13.3 g/dL (ref 12.0–15.0)
MCH: 30.2 pg (ref 26.0–34.0)
MCHC: 32.2 g/dL (ref 30.0–36.0)
MCV: 93.7 fL (ref 80.0–100.0)
Platelets: 257 10*3/uL (ref 150–400)
RBC: 4.41 MIL/uL (ref 3.87–5.11)
RDW: 13.6 % (ref 11.5–15.5)
WBC: 6.2 10*3/uL (ref 4.0–10.5)
nRBC: 0 % (ref 0.0–0.2)

## 2019-01-24 LAB — BASIC METABOLIC PANEL
Anion gap: 9 (ref 5–15)
BUN: 16 mg/dL (ref 8–23)
CO2: 23 mmol/L (ref 22–32)
Calcium: 9.5 mg/dL (ref 8.9–10.3)
Chloride: 103 mmol/L (ref 98–111)
Creatinine, Ser: 1.02 mg/dL — ABNORMAL HIGH (ref 0.44–1.00)
GFR calc Af Amer: >60 mL/min (ref >60)
GFR calc non Af Amer: 57 mL/min — ABNORMAL LOW (ref >60)
Glucose, Bld: 106 mg/dL — ABNORMAL HIGH (ref 70–99)
Potassium: 4.6 mmol/L (ref 3.5–5.1)
Sodium: 135 mmol/L (ref 135–145)

## 2019-01-24 NOTE — Progress Notes (Signed)
Primary Care Physician: Leamon Arnt, MD Referring Physician: Dr. Chancy Hurter is a 68 y.o. female with a h/o paroxysmal afib that is in the afib clinic at pt's request as she went out of rhythm yesterday am. She  had an ablation 12/15. She  denies any shortness of breath, no extra  fluid. No triggers that she is aware of. She has taken extra 30 mg Cardizem yesterday and an extra 120 mg Cardizem this am but she persists in atrial flutter. This does not feel as uncomfortable to her that the afib did prior to ablation. She  continues on eliquis 5 mg bid for a CHA2DS2VASc score of at least 2.  She continues on her flecainide 50 mg bid. No swallowing or groin issues. EKG shows atrial flutter at 118 bpm.   Returns to clinic 12/31. 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   Has not missed any anticoagulation x for at least 3 weeks.   Today, she denies symptoms of palpitations, chest pain, shortness of breath, orthopnea, PND, lower extremity edema, dizziness, presyncope, syncope, or neurologic sequela. The patient is tolerating medications without difficulties and is otherwise without complaint today.   Past Medical History:  Diagnosis Date  . AR (allergic rhinitis)   . Atrial fibrillation ([place])   . Clotting disorder ([place])   . Diverticulosis   . GERD (gastroesophageal reflux disease)   . Hiatal hernia   . Hiatal hernia   . Hypothyroidism   . Migraines   . Mild sleep apnea   . Osteoarthritis   . Osteopenia after menopause 06/14/2017   DEXA 08/2016 [facility]: T = -1.10 radius, -1.0 femur; lumbar spine elevated due to DJD; recheck 2-3 years.  . Pacemaker    CHB  . Paroxysmal A-fib ([place])   . PUD (peptic ulcer disease)   . Sleep apnea   . Thyroid nodule   . Transient complete heart block 1996   Past Surgical History:  Procedure Laterality Date  . ATRIAL  FIBRILLATION ABLATION N/A 01/08/2019   Procedure: ATRIAL FIBRILLATION ABLATION;  Surgeon: [name], MD;  Location: [facility];  Service: Cardiovascular;  Laterality: N/A;  . HIP ARTHROPLASTY Right 2008  . PACEMAKER GENERATOR CHANGE  03/01/2007   performed by Dr Rosita [facility] at [facility])  . PACEMAKER INSERTION  09/08/1994   performed in [facility] for transient complete heart block  . THYROIDECTOMY, PARTIAL  1986   BENIGN NODULES  . TOTAL HIP ARTHROPLASTY Left 02/13/2018   Procedure: TOTAL HIP ARTHROPLASTY ANTERIOR APPROACH;  Surgeon: Paralee Cancel, MD;  Location: [facility];  Service: Orthopedics;  Laterality: Left;  70 mins  . TUBAL LIGATION  1987    Current Outpatient Medications  Medication Sig Dispense Refill  . amoxicillin (AMOXIL) 500 MG capsule Take 2,000 mg by mouth See admin instructions. Take 4 capsules (2000 mg) by mouth 1 hour before dental visits    . Calcium Carb-Cholecalciferol (CALCIUM/VITAMIN D) 600-400 MG-UNIT TABS Take 1 tablet by mouth daily.     . Cyanocobalamin (B-12) 1000 MCG SUBL Place 1 mL under the tongue once a week.     . diclofenac sodium (VOLTAREN) 1 % GEL Apply 2 g topically 4 (four) times daily as needed. (Patient taking differently: Apply 2 g topically 4 (four) times daily as needed (pain.). ) 100 g  5  . diltiazem (CARDIZEM CD) 120 MG 24 hr capsule Take 1 capsule (120 mg total) by mouth 2 (two) times daily. 180 capsule 2  . diltiazem (CARDIZEM) 30 MG tablet Take 1 tablet every 4 hours AS NEEDED for AFIB heart rate >100 45 tablet 3  . ELIQUIS 5 MG TABS tablet TAKE 1 TABLET BY MOUTH TWICE DAILY. 180 tablet 1  . fexofenadine (ALLEGRA) 180 MG tablet Take 180 mg by mouth every evening.     . flecainide (TAMBOCOR) 50 MG tablet Take 1 tablet (50 mg total) by mouth 2 (two) times daily. 60 tablet 3  . levothyroxine (SYNTHROID) 125 MCG tablet TAKE 1 TABLET BY MOUTH DAILY. (Patient taking differently: Take 125 mcg by mouth daily before breakfast. )  90 tablet 0  . Magnesium 250 MG TABS Take 250 mg by mouth every evening.     Marland Kitchen omeprazole (PRILOSEC) 20 MG capsule TAKE (1) CAPSULE DAILY. (Patient taking differently: Take 20 mg by mouth daily before breakfast. ) 90 capsule 3  . Potassium 99 MG TABS Take 99 mg by mouth daily.     . sodium chloride (OCEAN) 0.65 % SOLN nasal spray Place 1 spray into both nostrils 2 (two) times daily.     . traMADol (ULTRAM) 50 MG tablet Take 1 tablet (50 mg total) by mouth every 6 (six) hours as needed for moderate pain. (Patient taking differently: Take 50 mg by mouth every 12 (twelve) hours as needed for moderate pain. ) 30 tablet 0   No current facility-administered medications for this encounter.    Allergies  Allergen Reactions  . Morphine And Related Hives and Itching  . Neomycin Other (See Comments)    itching   . Adhesive [Tape] Rash    Blistering rash     Social History   Socioeconomic History  . Marital status: Married    Spouse name: Not on file  . Number of children: 0  . Years of education: Not on file  . Highest education level: Not on file  Occupational History  . Occupation: retired  Tobacco Use  . Smoking status: Never Smoker  . Smokeless tobacco: Never Used  Substance and Sexual Activity  . Alcohol use: Not Currently    Alcohol/week: 1.0 standard drinks    Types: 1 Glasses of wine per week    Comment: occasionally  . Drug use: No  . Sexual activity: Not Currently  Other Topics Concern  . 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 Difficulty of Paying Living Expenses: Not on file  Food Insecurity:   . Worried About Charity fundraiser in the Last Year: Not on file  . Ran Out of Food in the Last Year: Not on file  Transportation Needs:   . Lack of Transportation (Medical): Not on file  . Lack of Transportation (Non-Medical): Not on  file  Physical Activity:   . Days of Exercise per Week: Not on file  . Minutes of Exercise per Session: Not on file  Stress:   . Feeling of Stress : Not on file  Social Connections:   . Frequency of Communication with Friends and Family: Not on file  . Frequency of Social Gatherings with Friends and Family: Not on file  . Attends Religious Services: Not on file  . Active Member of Clubs  or Organizations: Not on file  . Attends Archivist Meetings: Not on file  . Marital Status: Not on file  Intimate Partner Violence:   . Fear of Current or Ex-Partner: Not on file  . Emotionally Abused: Not on file  . Physically Abused: Not on file  . Sexually Abused: Not on file    Family History  Problem Relation Age of Onset  . Diabetes Mellitus II Maternal Uncle   . Cancer Maternal Grandmother        ENDOMETRIAL  . Rheum arthritis Mother   . Peptic Ulcer Mother   . COPD Brother   . Drug abuse Brother   . Breast cancer Neg Hx     ROS- All systems are reviewed and negative except as per the HPI above  Physical Exam: Vitals:   01/24/19 1157  BP: 108/80  Pulse: (!) 104  Height: 5\' 7"  (1.702 m)   Wt Readings from Last 3 Encounters:  01/23/19 93.8 kg  01/08/19 90.7 kg  12/31/18 92.5 kg    Labs: Lab Results  Component Value Date   NA 140 12/31/2018   K 4.6 12/31/2018   CL 101 12/31/2018   CO2 22 12/31/2018   GLUCOSE 95 12/31/2018   BUN 15 12/31/2018   CREATININE 0.97 12/31/2018   CALCIUM 9.4 12/31/2018   Lab Results  Component Value Date   INR 0.87 02/13/2018   Lab Results  Component Value Date   CHOL 187 10/08/2018   HDL 73.50 10/08/2018   [place] 95 10/08/2018   TRIG 93.0 10/08/2018     GEN- The patient is well appearing, alert and oriented x 3 today.   Head- normocephalic, atraumatic Eyes-  Sclera clear, conjunctiva pink Ears- hearing intact Oropharynx- clear Neck- supple, no JVP Lymph- no cervical lymphadenopathy Lungs- Clear to ausculation  bilaterally, normal work of breathing Heart- irregular rate and rhythm, no murmurs, rubs or gallops, PMI not laterally displaced GI- soft, NT, ND, + BS Extremities- no clubbing, cyanosis, or edema MS- no significant deformity or atrophy Skin- no rash or lesion Psych- euthymic mood, full affect Neuro- strength and sensation are intact  EKG-EKG reads stemi but does not fit clinical picture with  the pt comfortable without any chest pain. . I think the flutter waves appear like st elevation.  I think the more likely reading  is atrial flutter at 104 bpm, IRBBB  Epic records reviewed.     Assessment and Plan: 1. afib S/p ablation  01/08/19 Now in persistent  atrial flutter  Extra CCB nor  extra flecainide has not helped to return to SR Will  set up for DCCV Continue usual  Cardizem/flecanide dose  Covid test set up for Saturday am as the covid testing line closed today at noon Has  DCCV set up for 2 pm Monday to allow for test to result out that am Cbc/bmet  2. CHA2DS2VASc score of 2 Continue eliquis 5 mg bid, no missed does x 3 weeks   Has f/u here 02/05/19  Geroge Baseman. Rasheed Welty, [facility] [address] [place], [place] [address] [phone]

## 2019-01-24 NOTE — H&P (View-Only) (Signed)
Primary Care Physician: Leamon Arnt, MD Referring Physician: Dr. Chancy Hurter is a 68 y.o. female with a h/o paroxysmal afib that is in the afib clinic at pt's request as she went out of rhythm yesterday am. She  had an ablation 12/15. She  denies any shortness of breath, no extra  fluid. No triggers that she is aware of. She has taken extra 30 mg Cardizem yesterday and an extra 120 mg Cardizem this am but she persists in atrial flutter. This does not feel as uncomfortable to her that the afib did prior to ablation. She  continues on eliquis 5 mg bid for a CHA2DS2VASc score of at least 2.  She continues on her flecainide 50 mg bid. No swallowing or groin issues. EKG shows atrial flutter at 118 bpm.   Returns to clinic 12/31. 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   Has not missed any anticoagulation x for at least 3 weeks.   Today, she denies symptoms of palpitations, chest pain, shortness of breath, orthopnea, PND, lower extremity edema, dizziness, presyncope, syncope, or neurologic sequela. The patient is tolerating medications without difficulties and is otherwise without complaint today.   Past Medical History:  Diagnosis Date  . AR (allergic rhinitis)   . Atrial fibrillation ([place])   . Clotting disorder ([place])   . Diverticulosis   . GERD (gastroesophageal reflux disease)   . Hiatal hernia   . Hiatal hernia   . Hypothyroidism   . Migraines   . Mild sleep apnea   . Osteoarthritis   . Osteopenia after menopause 06/14/2017   DEXA 08/2016 [facility]: T = -1.10 radius, -1.0 femur; lumbar spine elevated due to DJD; recheck 2-3 years.  . Pacemaker    CHB  . Paroxysmal A-fib ([place])   . PUD (peptic ulcer disease)   . Sleep apnea   . Thyroid nodule   . Transient complete heart block 1996   Past Surgical History:  Procedure Laterality Date  . ATRIAL  FIBRILLATION ABLATION N/A 01/08/2019   Procedure: ATRIAL FIBRILLATION ABLATION;  Surgeon: [name], MD;  Location: [place] [facility];  Service: Cardiovascular;  Laterality: N/A;  . HIP ARTHROPLASTY Right 2008  . PACEMAKER GENERATOR CHANGE  03/01/2007   performed [facility] Dr Rosita Fire at [facility])  . PACEMAKER INSERTION  09/08/1994   performed in [facility] for transient complete heart block  . THYROIDECTOMY, PARTIAL  1986   BENIGN NODULES  . TOTAL HIP ARTHROPLASTY Left 02/13/2018   Procedure: TOTAL HIP ARTHROPLASTY ANTERIOR APPROACH;  Surgeon: Paralee Cancel, MD;  Location: [facility];  Service: Orthopedics;  Laterality: Left;  70 mins  . TUBAL LIGATION  1987    Current Outpatient Medications  Medication Sig Dispense Refill  . amoxicillin (AMOXIL) 500 MG capsule Take 2,000 mg by mouth See admin instructions. Take 4 capsules (2000 mg) by mouth 1 hour before dental visits    . Calcium Carb-Cholecalciferol (CALCIUM/VITAMIN D) 600-400 MG-UNIT TABS Take 1 tablet by mouth daily.     . Cyanocobalamin (B-12) 1000 MCG SUBL Place 1 mL under the tongue once a week.     . diclofenac sodium (VOLTAREN) 1 % GEL Apply 2 g topically 4 (four) times daily as needed. (Patient taking differently: Apply 2 g topically 4 (four) times daily as needed (pain.). ) 100 g  5  . diltiazem (CARDIZEM CD) 120 MG 24 hr capsule Take 1 capsule (120 mg total) by mouth 2 (two) times daily. 180 capsule 2  . diltiazem (CARDIZEM) 30 MG tablet Take 1 tablet every 4 hours AS NEEDED for AFIB heart rate >100 45 tablet 3  . ELIQUIS 5 MG TABS tablet TAKE 1 TABLET BY MOUTH TWICE DAILY. 180 tablet 1  . fexofenadine (ALLEGRA) 180 MG tablet Take 180 mg by mouth every evening.     . flecainide (TAMBOCOR) 50 MG tablet Take 1 tablet (50 mg total) by mouth 2 (two) times daily. 60 tablet 3  . levothyroxine (SYNTHROID) 125 MCG tablet TAKE 1 TABLET BY MOUTH DAILY. (Patient taking differently: Take 125 mcg by mouth daily before breakfast. )  90 tablet 0  . Magnesium 250 MG TABS Take 250 mg by mouth every evening.     Marland Kitchen omeprazole (PRILOSEC) 20 MG capsule TAKE (1) CAPSULE DAILY. (Patient taking differently: Take 20 mg by mouth daily before breakfast. ) 90 capsule 3  . Potassium 99 MG TABS Take 99 mg by mouth daily.     . sodium chloride (OCEAN) 0.65 % SOLN nasal spray Place 1 spray into both nostrils 2 (two) times daily.     . traMADol (ULTRAM) 50 MG tablet Take 1 tablet (50 mg total) by mouth every 6 (six) hours as needed for moderate pain. (Patient taking differently: Take 50 mg by mouth every 12 (twelve) hours as needed for moderate pain. ) 30 tablet 0   No current facility-administered medications for this encounter.    Allergies  Allergen Reactions  . Morphine And Related Hives and Itching  . Neomycin Other (See Comments)    itching   . Adhesive [Tape] Rash    Blistering rash     Social History   Socioeconomic History  . Marital status: Married    Spouse name: Not on file  . Number of children: 0  . Years of education: Not on file  . Highest education level: Not on file  Occupational History  . Occupation: retired  Tobacco Use  . Smoking status: Never Smoker  . Smokeless tobacco: Never Used  Substance and Sexual Activity  . Alcohol use: Not Currently    Alcohol/week: 1.0 standard drinks    Types: 1 Glasses of wine per week    Comment: occasionally  . Drug use: No  . Sexual activity: Not Currently  Other Topics Concern  . 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 Difficulty of Paying Living Expenses: Not on file  Food Insecurity:   . Worried About Charity fundraiser in the Last Year: Not on file  . Ran Out of Food in the Last Year: Not on file  Transportation Needs:   . Lack of Transportation (Medical): Not on file  . Lack of Transportation (Non-Medical): Not on  file  Physical Activity:   . Days of Exercise per Week: Not on file  . Minutes of Exercise per Session: Not on file  Stress:   . Feeling of Stress : Not on file  Social Connections:   . Frequency of Communication with Friends and Family: Not on file  . Frequency of Social Gatherings with Friends and Family: Not on file  . Attends Religious Services: Not on file  . Active Member of Clubs  or Organizations: Not on file  . Attends Archivist Meetings: Not on file  . Marital Status: Not on file  Intimate Partner Violence:   . Fear of Current or Ex-Partner: Not on file  . Emotionally Abused: Not on file  . Physically Abused: Not on file  . Sexually Abused: Not on file    Family History  Problem Relation Age of Onset  . Diabetes Mellitus II Maternal Uncle   . Cancer Maternal Grandmother        ENDOMETRIAL  . Rheum arthritis Mother   . Peptic Ulcer Mother   . COPD Brother   . Drug abuse Brother   . Breast cancer Neg Hx     ROS- All systems are reviewed and negative except as per the HPI above  Physical Exam: Vitals:   01/24/19 1157  BP: 108/80  Pulse: (!) 104  Height: 5\' 7"  (1.702 m)   Wt Readings from Last 3 Encounters:  01/23/19 93.8 kg  01/08/19 90.7 kg  12/31/18 92.5 kg    Labs: Lab Results  Component Value Date   NA 140 12/31/2018   K 4.6 12/31/2018   CL 101 12/31/2018   CO2 22 12/31/2018   GLUCOSE 95 12/31/2018   BUN 15 12/31/2018   CREATININE 0.97 12/31/2018   CALCIUM 9.4 12/31/2018   Lab Results  Component Value Date   INR 0.87 02/13/2018   Lab Results  Component Value Date   CHOL 187 10/08/2018   HDL 73.50 10/08/2018   [place] 95 10/08/2018   TRIG 93.0 10/08/2018     GEN- The patient is well appearing, alert and oriented x 3 today.   Head- normocephalic, atraumatic Eyes-  Sclera clear, conjunctiva pink Ears- hearing intact Oropharynx- clear Neck- supple, no JVP Lymph- no cervical lymphadenopathy Lungs- Clear to ausculation  bilaterally, normal work of breathing Heart- irregular rate and rhythm, no murmurs, rubs or gallops, PMI not laterally displaced GI- soft, NT, ND, + BS Extremities- no clubbing, cyanosis, or edema MS- no significant deformity or atrophy Skin- no rash or lesion Psych- euthymic mood, full affect Neuro- strength and sensation are intact  EKG-EKG reads stemi but does not fit clinical picture with  the pt comfortable without any chest pain. . I think the flutter waves appear like st elevation.  I think the more likely reading  is atrial flutter at 104 bpm, IRBBB  Epic records reviewed.     Assessment and Plan: 1. afib S/p ablation  01/08/19 Now in persistent  atrial flutter  Extra CCB nor  extra flecainide has not helped to return to SR Will  set up for DCCV Continue usual  Cardizem/flecanide dose  Covid test set up for Saturday am as the covid testing line closed today at noon Has  DCCV set up for 2 pm Monday to allow for test to result out that am Cbc/bmet  2. CHA2DS2VASc score of 2 Continue eliquis 5 mg bid, no missed does x 3 weeks   Has f/u here 02/05/19  Geroge Baseman. Nelani Schmelzle, [facility] [address] [place], [place] [address] [phone]

## 2019-01-24 NOTE — Addendum Note (Signed)
Encounter addended by: Enid Derry, CMA on: 01/24/2019 3:14 PM  Actions taken: Visit diagnoses modified, Diagnosis association updated, Order list changed

## 2019-01-24 NOTE — Patient Instructions (Addendum)
Cardioversion schedule for Monday January. 4th 2021  arrive at the Main Entrance of [facility] report to admitting at 1:00pm.  Nothing to eat or drink after Midnight. Take morning medications with a sip of water.   Cannot drive home after procedure driver must be present at discharge.

## 2019-01-26 ENCOUNTER — Other Ambulatory Visit (HOSPITAL_COMMUNITY)
Admission: RE | Admit: 2019-01-26 | Discharge: 2019-01-26 | Disposition: A | Discharge status: Home / Self Care | Payer: Medicare Other | Source: Ambulatory Visit | Attending: Cardiology | Admitting: Cardiology

## 2019-01-26 DIAGNOSIS — Z01812 Encounter for preprocedural laboratory examination: Secondary | ICD-10-CM | POA: Insufficient documentation

## 2019-01-26 DIAGNOSIS — Z20822 Contact with and (suspected) exposure to covid-19: Secondary | ICD-10-CM | POA: Diagnosis not present

## 2019-01-26 LAB — SARS CORONAVIRUS 2 (TAT 6-24 HRS): SARS Coronavirus 2: NEGATIVE

## 2019-01-28 ENCOUNTER — Encounter (HOSPITAL_COMMUNITY)
Admission: RE | Disposition: A | Discharge status: Home / Self Care | Payer: Self-pay | Source: Home / Self Care | Attending: Cardiology

## 2019-01-28 ENCOUNTER — Ambulatory Visit (HOSPITAL_COMMUNITY)
Admission: RE | Admit: 2019-01-28 | Discharge: 2019-01-28 | Disposition: A | Discharge status: Home / Self Care | Payer: Medicare PPO | Attending: Cardiology | Admitting: Cardiology

## 2019-01-28 ENCOUNTER — Ambulatory Visit (HOSPITAL_COMMUNITY): Payer: Medicare PPO | Admitting: Certified Registered Nurse Anesthetist

## 2019-01-28 ENCOUNTER — Other Ambulatory Visit: Payer: Self-pay

## 2019-01-28 ENCOUNTER — Encounter (HOSPITAL_COMMUNITY): Payer: Self-pay | Admitting: Cardiology

## 2019-01-28 DIAGNOSIS — K219 Gastro-esophageal reflux disease without esophagitis: Secondary | ICD-10-CM | POA: Insufficient documentation

## 2019-01-28 DIAGNOSIS — Z950 Presence of cardiac pacemaker: Secondary | ICD-10-CM | POA: Insufficient documentation

## 2019-01-28 DIAGNOSIS — I442 Atrioventricular block, complete: Secondary | ICD-10-CM | POA: Diagnosis not present

## 2019-01-28 DIAGNOSIS — E039 Hypothyroidism, unspecified: Secondary | ICD-10-CM | POA: Insufficient documentation

## 2019-01-28 DIAGNOSIS — Z79899 Other long term (current) drug therapy: Secondary | ICD-10-CM | POA: Diagnosis not present

## 2019-01-28 DIAGNOSIS — I4892 Unspecified atrial flutter: Secondary | ICD-10-CM

## 2019-01-28 DIAGNOSIS — I480 Paroxysmal atrial fibrillation: Secondary | ICD-10-CM | POA: Insufficient documentation

## 2019-01-28 DIAGNOSIS — Z7901 Long term (current) use of anticoagulants: Secondary | ICD-10-CM | POA: Diagnosis not present

## 2019-01-28 DIAGNOSIS — K5790 Diverticulosis of intestine, part unspecified, without perforation or abscess without bleeding: Secondary | ICD-10-CM | POA: Insufficient documentation

## 2019-01-28 DIAGNOSIS — G4730 Sleep apnea, unspecified: Secondary | ICD-10-CM | POA: Diagnosis not present

## 2019-01-28 DIAGNOSIS — Z79890 Hormone replacement therapy (postmenopausal): Secondary | ICD-10-CM | POA: Insufficient documentation

## 2019-01-28 HISTORY — PX: CARDIOVERSION: SHX1299

## 2019-01-28 SURGERY — CARDIOVERSION
Anesthesia: General

## 2019-01-28 MED ORDER — SODIUM CHLORIDE 0.9 % IV SOLN: INTRAVENOUS | Status: DC

## 2019-01-28 MED ORDER — PROPOFOL 10 MG/ML IV BOLUS
INTRAVENOUS | Status: DC | PRN
  Administered 2019-01-28: 60 mg via INTRAVENOUS

## 2019-01-28 MED ORDER — LIDOCAINE 2% (20 MG/ML) 5 ML SYRINGE
INTRAMUSCULAR | Status: DC | PRN
  Administered 2019-01-28: 60 mg via INTRAVENOUS

## 2019-01-28 NOTE — Anesthesia Preprocedure Evaluation (Signed)
Anesthesia Evaluation  Patient identified by MRN, date of birth, ID band Patient awake    Reviewed: Allergy & Precautions, H&P , NPO status , Patient's Chart, lab work & pertinent test results  Airway Mallampati: II   Neck ROM: full    Dental   Pulmonary sleep apnea ,    breath sounds clear to auscultation       Cardiovascular + dysrhythmias Atrial Fibrillation + pacemaker  Rhythm:irregular Rate:Normal     Neuro/Psych  Headaches,    GI/Hepatic hiatal hernia, PUD, GERD  ,  Endo/Other  Hypothyroidism   Renal/GU      Musculoskeletal  (+) Arthritis ,   Abdominal   Peds  Hematology   Anesthesia Other Findings   Reproductive/Obstetrics                             Anesthesia Physical Anesthesia Plan  ASA: III  Anesthesia Plan: General   Post-op Pain Management:    Induction: Intravenous  PONV Risk Score and Plan: 3 and Propofol infusion and Treatment may vary due to age or medical condition  Airway Management Planned: Mask  Additional Equipment:   Intra-op Plan:   Post-operative Plan:   Informed Consent: I have reviewed the patients History and Physical, chart, labs and discussed the procedure including the risks, benefits and alternatives for the proposed anesthesia with the patient or authorized representative who has indicated his/her understanding and acceptance.       Plan Discussed with: CRNA, Anesthesiologist and Surgeon  Anesthesia Plan Comments:         Anesthesia Quick Evaluation

## 2019-01-28 NOTE — Discharge Instructions (Signed)

## 2019-01-28 NOTE — CV Procedure (Signed)
Procedure:   DCCV  Indication:  Symptomatic atrial flutter  Procedure Note:  The patient signed informed consent.  They have had had therapeutic anticoagulation with Eliquis greater than 3 weeks.  Device was interrogated prior to procedure, confirming atrial flutter.  Anesthesia was administered by Dr. Marcie Bal and Richardean Canal, CRNA.  Adequate airway was maintained throughout and vital followed per protocol.  They were cardioverted x 1 with 100J of biphasic synchronized energy.  They converted to NSR.  There were no apparent complications.  The patient had normal neuro status and respiratory status post procedure with vitals stable as recorded elsewhere.    Follow up:  They will continue on current medical therapy and follow up with cardiology as scheduled.  Oswaldo Milian, MD 01/28/2019 2:13 PM

## 2019-01-28 NOTE — Interval H&P Note (Signed)
History and Physical Interval Note:  01/28/2019 1:55 PM  Kristin [name]  has presented today for surgery, with the diagnosis of AFIB.  The various methods of treatment have been discussed with the patient and family. After consideration of risks, benefits and other options for treatment, the patient has consented to  Procedure(s): CARDIOVERSION (N/A) as a surgical intervention.  The patient's history has been reviewed, patient examined, no change in status, stable for surgery.  I have reviewed the patient's chart and labs.  Questions were answered to the patient's satisfaction.     Donato Heinz

## 2019-01-28 NOTE — Transfer of Care (Signed)
Immediate Anesthesia Transfer of Care Note  Patient: Kristin [name]  Procedure(s) Performed: CARDIOVERSION (N/A )  Patient Location: Endoscopy Unit  Anesthesia Type:General  Level of Consciousness: drowsy and patient cooperative  Airway & Oxygen Therapy: Patient Spontanous Breathing  Post-op Assessment: Report given to RN, Post -op Vital signs reviewed and stable and Patient moving all extremities X 4  Post vital signs: Reviewed and stable  Last Vitals:  Vitals Value Taken Time  BP    Temp    Pulse    Resp    SpO2      Last Pain:  Vitals:   01/28/19 1330  TempSrc: Oral  PainSc: 0-No pain         Complications: No apparent anesthesia complications

## 2019-01-30 NOTE — Anesthesia Postprocedure Evaluation (Signed)
Anesthesia Post Note  Patient: Kristin [name]  Procedure(s) Performed: CARDIOVERSION (N/A )     Patient location during evaluation: Endoscopy Anesthesia Type: General Level of consciousness: awake and alert Pain management: pain level controlled Vital Signs Assessment: post-procedure vital signs reviewed and stable Respiratory status: spontaneous breathing, nonlabored ventilation, respiratory function stable and patient connected to nasal cannula oxygen Cardiovascular status: blood pressure returned to baseline and stable Postop Assessment: no apparent nausea or vomiting Anesthetic complications: no    Last Vitals:  Vitals:   01/28/19 1425 01/28/19 1435  BP: 98/72 112/73  Pulse: 77 75  Resp: 14 19  Temp:    SpO2: 98% 98%    Last Pain:  Vitals:   01/29/19 1256  TempSrc:   PainSc: 0-No pain                 [name] S

## 2019-02-05 ENCOUNTER — Encounter (HOSPITAL_COMMUNITY): Payer: Self-pay | Admitting: Nurse Practitioner

## 2019-02-05 ENCOUNTER — Other Ambulatory Visit: Payer: Self-pay

## 2019-02-05 ENCOUNTER — Ambulatory Visit (HOSPITAL_COMMUNITY)
Admission: RE | Admit: 2019-02-05 | Discharge: 2019-02-05 | Disposition: A | Discharge status: Home / Self Care | Payer: Medicare PPO | Source: Ambulatory Visit | Attending: Nurse Practitioner | Admitting: Nurse Practitioner

## 2019-02-05 VITALS — BP 104/74 | HR 80 | Ht 67.0 in | Wt 202.0 lb

## 2019-02-05 DIAGNOSIS — I442 Atrioventricular block, complete: Secondary | ICD-10-CM | POA: Insufficient documentation

## 2019-02-05 DIAGNOSIS — R9431 Abnormal electrocardiogram [ECG] [EKG]: Secondary | ICD-10-CM | POA: Insufficient documentation

## 2019-02-05 DIAGNOSIS — Z950 Presence of cardiac pacemaker: Secondary | ICD-10-CM | POA: Diagnosis not present

## 2019-02-05 DIAGNOSIS — Z881 Allergy status to other antibiotic agents status: Secondary | ICD-10-CM | POA: Insufficient documentation

## 2019-02-05 DIAGNOSIS — I480 Paroxysmal atrial fibrillation: Secondary | ICD-10-CM | POA: Insufficient documentation

## 2019-02-05 DIAGNOSIS — Z886 Allergy status to analgesic agent status: Secondary | ICD-10-CM | POA: Insufficient documentation

## 2019-02-05 DIAGNOSIS — Z833 Family history of diabetes mellitus: Secondary | ICD-10-CM | POA: Diagnosis not present

## 2019-02-05 DIAGNOSIS — E039 Hypothyroidism, unspecified: Secondary | ICD-10-CM | POA: Insufficient documentation

## 2019-02-05 DIAGNOSIS — I4892 Unspecified atrial flutter: Secondary | ICD-10-CM | POA: Diagnosis not present

## 2019-02-05 DIAGNOSIS — M47816 Spondylosis without myelopathy or radiculopathy, lumbar region: Secondary | ICD-10-CM | POA: Insufficient documentation

## 2019-02-05 DIAGNOSIS — Z96642 Presence of left artificial hip joint: Secondary | ICD-10-CM | POA: Diagnosis not present

## 2019-02-05 DIAGNOSIS — K219 Gastro-esophageal reflux disease without esophagitis: Secondary | ICD-10-CM | POA: Diagnosis not present

## 2019-02-05 DIAGNOSIS — Z79899 Other long term (current) drug therapy: Secondary | ICD-10-CM | POA: Diagnosis not present

## 2019-02-05 DIAGNOSIS — Z888 Allergy status to other drugs, medicaments and biological substances status: Secondary | ICD-10-CM | POA: Insufficient documentation

## 2019-02-05 DIAGNOSIS — I4510 Unspecified right bundle-branch block: Secondary | ICD-10-CM | POA: Diagnosis not present

## 2019-02-05 DIAGNOSIS — Z7901 Long term (current) use of anticoagulants: Secondary | ICD-10-CM | POA: Diagnosis not present

## 2019-02-05 DIAGNOSIS — M8580 Other specified disorders of bone density and structure, unspecified site: Secondary | ICD-10-CM | POA: Diagnosis not present

## 2019-02-05 DIAGNOSIS — Z792 Long term (current) use of antibiotics: Secondary | ICD-10-CM | POA: Insufficient documentation

## 2019-02-05 DIAGNOSIS — Z79890 Hormone replacement therapy (postmenopausal): Secondary | ICD-10-CM | POA: Diagnosis not present

## 2019-02-05 DIAGNOSIS — Z8049 Family history of malignant neoplasm of other genital organs: Secondary | ICD-10-CM | POA: Diagnosis not present

## 2019-02-05 DIAGNOSIS — D6869 Other thrombophilia: Secondary | ICD-10-CM

## 2019-02-05 DIAGNOSIS — G4730 Sleep apnea, unspecified: Secondary | ICD-10-CM | POA: Insufficient documentation

## 2019-02-05 NOTE — Progress Notes (Signed)
Primary Care Physician: Leamon Arnt, MD Referring Physician: Dr. Chancy Hurter is a 69 y.o. female with a h/o paroxysmal afib that is in the afib clinic at pt's request as she went out of rhythm yesterday am. She  had an ablation 12/15. She  denies any shortness of breath, no extra  fluid. No triggers that she is aware of. She has taken extra 30 mg Cardizem yesterday and an extra 120 mg Cardizem this am but she persists in atrial flutter. This does not feel as uncomfortable to her that the afib did prior to ablation. She  continues on eliquis 5 mg bid for a CHA2DS2VASc score of at least 2.  She continues on her flecainide 50 mg bid. No swallowing or groin issues. EKG shows atrial flutter at 118 bpm.   Returns to clinic 12/31. 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   Has not missed any anticoagulation x for at least 3 weeks.   F/u in afib clinic, 02/05/19. She had successful cardioversion and is staying in SR. She feels improved.   Today, she denies symptoms of palpitations, chest pain, shortness of breath, orthopnea, PND, lower extremity edema, dizziness, presyncope, syncope, or neurologic sequela. The patient is tolerating medications without difficulties and is otherwise without complaint today.   Past Medical History:  Diagnosis Date  . AR (allergic rhinitis)   . Atrial fibrillation ([place])   . Clotting disorder ([place])   . Diverticulosis   . GERD (gastroesophageal reflux disease)   . Hiatal hernia   . Hiatal hernia   . Hypothyroidism   . Migraines   . Mild sleep apnea   . Osteoarthritis   . Osteopenia after menopause 06/14/2017   DEXA 08/2016 [facility]: T = -1.10 radius, -1.0 femur; lumbar spine elevated due to DJD; recheck 2-3 years.  . Pacemaker    CHB  . Paroxysmal A-fib ([place])   . PUD (peptic ulcer disease)   . Sleep apnea   . Thyroid nodule   .  Transient complete heart block 1996   Past Surgical History:  Procedure Laterality Date  . ATRIAL FIBRILLATION ABLATION N/A 01/08/2019   Procedure: ATRIAL FIBRILLATION ABLATION;  Surgeon: [name], MD;  Location: [place] [facility];  Service: Cardiovascular;  Laterality: N/A;  . CARDIOVERSION N/A 01/28/2019   Procedure: CARDIOVERSION;  Surgeon: Donato Heinz, MD;  Location: [facility]OS[facility];  Service: Endoscopy;  Laterality: N/A;  . HIP ARTHROPLASTY Right 2008  . PACEMAKER GENERATOR CHANGE  03/01/2007   performed [facility] Rosita Fire at [facility])  . PACEMAKER INSERTION  09/08/1994   performed in [facility] for transient complete heart block  . THYROIDECTOMY, PARTIAL  1986   BENIGN NODULES  . TOTAL HIP ARTHROPLASTY Left 02/13/2018   Procedure: TOTAL HIP ARTHROPLASTY ANTERIOR APPROACH;  Surgeon: Paralee Cancel, MD;  Location: [facility];  Service: Orthopedics;  Laterality: Left;  70 mins  . TUBAL LIGATION  1987    Current Outpatient Medications  Medication Sig Dispense Refill  . amoxicillin (AMOXIL) 500 MG capsule Take 2,000 mg by mouth See admin instructions. Take 4 capsules (2000 mg) by mouth 1 hour before dental visits    . Calcium Carb-Cholecalciferol (CALCIUM/VITAMIN D) 600-400 MG-UNIT TABS Take 1 tablet by mouth daily.     . Cyanocobalamin (B-12) 1000 MCG SUBL Place 1 mL  under the tongue once a week.     . diclofenac sodium (VOLTAREN) 1 % GEL Apply 2 g topically 4 (four) times daily as needed. (Patient taking differently: Apply 2 g topically 4 (four) times daily as needed (pain.). ) 100 g 5  . diltiazem (CARDIZEM CD) 120 MG 24 hr capsule Take 1 capsule (120 mg total) by mouth 2 (two) times daily. 180 capsule 2  . diltiazem (CARDIZEM) 30 MG tablet Take 1 tablet every 4 hours AS NEEDED for AFIB heart rate >100 (Patient taking differently: Take 30 mg by mouth every 4 (four) hours as needed (AFIB heart rate >100). ) 45 tablet 3  . ELIQUIS 5 MG TABS tablet TAKE 1 TABLET BY  MOUTH TWICE DAILY. 180 tablet 1  . fexofenadine (ALLEGRA) 180 MG tablet Take 180 mg by mouth every evening.     . flecainide (TAMBOCOR) 50 MG tablet Take 1 tablet (50 mg total) by mouth 2 (two) times daily. 60 tablet 3  . levothyroxine (SYNTHROID) 125 MCG tablet TAKE 1 TABLET BY MOUTH DAILY. (Patient taking differently: Take 125 mcg by mouth daily before breakfast. ) 90 tablet 0  . Magnesium 250 MG TABS Take 250 mg by mouth every evening.     Marland Kitchen omeprazole (PRILOSEC) 20 MG capsule TAKE (1) CAPSULE DAILY. (Patient taking differently: Take 20 mg by mouth daily before breakfast. ) 90 capsule 3  . Potassium 99 MG TABS Take 99 mg by mouth daily.     . sodium chloride (OCEAN) 0.65 % SOLN nasal spray Place 1 spray into both nostrils 2 (two) times daily.     . traMADol (ULTRAM) 50 MG tablet Take 1 tablet (50 mg total) by mouth every 6 (six) hours as needed for moderate pain. (Patient taking differently: Take 50 mg by mouth every 12 (twelve) hours as needed for moderate pain. ) 30 tablet 0   No current facility-administered medications for this encounter.    Allergies  Allergen Reactions  . Morphine And Related Hives and Itching  . Neomycin Other (See Comments)    itching   . Adhesive [Tape] Rash    Blistering rash     Social History   Socioeconomic History  . Marital status: Married    Spouse name: Not on file  . Number of children: 0  . Years of education: Not on file  . Highest education level: Not on file  Occupational History  . Occupation: retired  Tobacco Use  . Smoking status: Never Smoker  . Smokeless tobacco: Never Used  Substance and Sexual Activity  . Alcohol use: Not Currently    Alcohol/week: 1.0 standard drinks    Types: 1 Glasses of wine per week    Comment: occasionally  . Drug use: No  . Sexual activity: Not Currently  Other Topics Concern  . 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 Difficulty of Paying Living Expenses: Not on file  Food Insecurity:   . Worried About Charity fundraiser in the Last Year: Not on file  . Ran Out of Food in the Last Year: Not on file  Transportation Needs:   . Lack of Transportation (Medical): Not on file  . Lack of Transportation (Non-Medical): Not on file  Physical Activity:   . Days of Exercise per Week: Not on file  .  Minutes of Exercise per Session: Not on file  Stress:   . Feeling of Stress : Not on file  Social Connections:   . Frequency of Communication with Friends and Family: Not on file  . Frequency of Social Gatherings with Friends and Family: Not on file  . Attends Religious Services: Not on file  . Active Member of Clubs or Organizations: Not on file  . Attends Archivist Meetings: Not on file  . Marital Status: Not on file  Intimate Partner Violence:   . Fear of Current or Ex-Partner: Not on file  . Emotionally Abused: Not on file  . Physically Abused: Not on file  . Sexually Abused: Not on file    Family History  Problem Relation Age of Onset  . Diabetes Mellitus II Maternal Uncle   . Cancer Maternal Grandmother        ENDOMETRIAL  . Rheum arthritis Mother   . Peptic Ulcer Mother   . COPD Brother   . Drug abuse Brother   . Breast cancer Neg Hx     ROS- All systems are reviewed and negative except as per the HPI above  Physical Exam: Vitals:   02/05/19 0926  BP: 104/74  Pulse: 80  Weight: 91.6 kg  Height: 5\' 7"  (1.702 m)   Wt Readings from Last 3 Encounters:  02/05/19 91.6 kg  01/28/19 93.8 kg  01/23/19 93.8 kg    Labs: Lab Results  Component Value Date   NA 135 01/24/2019   K 4.6 01/24/2019   CL 103 01/24/2019   CO2 23 01/24/2019   GLUCOSE 106 (H) 01/24/2019   BUN 16 01/24/2019   CREATININE 1.02 (H) 01/24/2019   CALCIUM 9.5 01/24/2019   Lab Results  Component Value Date   INR 0.87 02/13/2018   Lab  Results  Component Value Date   CHOL 187 10/08/2018   HDL 73.50 10/08/2018   [place] 95 10/08/2018   TRIG 93.0 10/08/2018     GEN- The patient is well appearing, alert and oriented x 3 today.   Head- normocephalic, atraumatic Eyes-  Sclera clear, conjunctiva pink Ears- hearing intact Oropharynx- clear Neck- supple, no JVP Lymph- no cervical lymphadenopathy Lungs- Clear to ausculation bilaterally, normal work of breathing Heart- regular rate and rhythm, no murmurs, rubs or gallops, PMI not laterally displaced GI- soft, NT, ND, + BS Extremities- no clubbing, cyanosis, or edema MS- no significant deformity or atrophy Skin- no rash or lesion Psych- euthymic mood, full affect Neuro- strength and sensation are intact  EKG-NSR at 80 bpm, PR int 168 ms, qrs int 110 ms, qtc 455 ms  Epic records reviewed.    Assessment and Plan: 1. afib S/p ablation 01/08/19 Persistent  atrial flutter occurred several weeks out from ablation and received  successful cardioversion 01/28/19. Staying in SR Extra CCB nor  extra flecainide did not help  to return to SR  Continue usual  Cardizem/flecanide dose  Encouraged to return to normal activities  2. CHA2DS2VASc score of 2 Continue eliquis 5 mg bid   F/u with Dr. Rayann Heman as scheduled 04/08/19  Geroge Baseman. Jacobus Colvin, [facility] [address] [place], [place] [address] [phone]

## 2019-02-25 ENCOUNTER — Other Ambulatory Visit: Payer: Self-pay

## 2019-02-25 ENCOUNTER — Ambulatory Visit (HOSPITAL_COMMUNITY)
Admission: RE | Admit: 2019-02-25 | Discharge: 2019-02-25 | Disposition: A | Discharge status: Home / Self Care | Payer: Medicare PPO | Source: Ambulatory Visit | Attending: Nurse Practitioner | Admitting: Nurse Practitioner

## 2019-02-25 ENCOUNTER — Encounter (HOSPITAL_COMMUNITY): Payer: Self-pay | Admitting: Nurse Practitioner

## 2019-02-25 ENCOUNTER — Other Ambulatory Visit (HOSPITAL_COMMUNITY)
Admission: RE | Admit: 2019-02-25 | Discharge: 2019-02-25 | Disposition: A | Discharge status: Home / Self Care | Payer: Medicare PPO | Source: Ambulatory Visit | Attending: Diagnostic Radiology | Admitting: Diagnostic Radiology

## 2019-02-25 VITALS — BP 112/70 | HR 108 | Ht 67.0 in | Wt 197.2 lb

## 2019-02-25 DIAGNOSIS — I480 Paroxysmal atrial fibrillation: Secondary | ICD-10-CM | POA: Diagnosis present

## 2019-02-25 DIAGNOSIS — Z79899 Other long term (current) drug therapy: Secondary | ICD-10-CM | POA: Diagnosis not present

## 2019-02-25 DIAGNOSIS — Z79890 Hormone replacement therapy (postmenopausal): Secondary | ICD-10-CM | POA: Insufficient documentation

## 2019-02-25 DIAGNOSIS — Z950 Presence of cardiac pacemaker: Secondary | ICD-10-CM | POA: Diagnosis not present

## 2019-02-25 DIAGNOSIS — E039 Hypothyroidism, unspecified: Secondary | ICD-10-CM | POA: Diagnosis not present

## 2019-02-25 DIAGNOSIS — D6869 Other thrombophilia: Secondary | ICD-10-CM | POA: Diagnosis not present

## 2019-02-25 DIAGNOSIS — Z20822 Contact with and (suspected) exposure to covid-19: Secondary | ICD-10-CM | POA: Insufficient documentation

## 2019-02-25 DIAGNOSIS — Z885 Allergy status to narcotic agent status: Secondary | ICD-10-CM | POA: Insufficient documentation

## 2019-02-25 DIAGNOSIS — K219 Gastro-esophageal reflux disease without esophagitis: Secondary | ICD-10-CM | POA: Insufficient documentation

## 2019-02-25 DIAGNOSIS — Z888 Allergy status to other drugs, medicaments and biological substances status: Secondary | ICD-10-CM | POA: Diagnosis not present

## 2019-02-25 DIAGNOSIS — G4730 Sleep apnea, unspecified: Secondary | ICD-10-CM | POA: Diagnosis not present

## 2019-02-25 DIAGNOSIS — I4892 Unspecified atrial flutter: Secondary | ICD-10-CM

## 2019-02-25 DIAGNOSIS — Z7901 Long term (current) use of anticoagulants: Secondary | ICD-10-CM | POA: Diagnosis not present

## 2019-02-25 LAB — BASIC METABOLIC PANEL
Anion gap: 16 — ABNORMAL HIGH (ref 5–15)
BUN: 11 mg/dL (ref 8–23)
CO2: 17 mmol/L — ABNORMAL LOW (ref 22–32)
Calcium: 9.7 mg/dL (ref 8.9–10.3)
Chloride: 103 mmol/L (ref 98–111)
Creatinine, Ser: 1 mg/dL (ref 0.44–1.00)
GFR calc Af Amer: >60 mL/min (ref >60)
GFR calc non Af Amer: 58 mL/min — ABNORMAL LOW (ref >60)
Glucose, Bld: 158 mg/dL — ABNORMAL HIGH (ref 70–99)
Potassium: 4.5 mmol/L (ref 3.5–5.1)
Sodium: 136 mmol/L (ref 135–145)

## 2019-02-25 LAB — CBC
HCT: 44.3 % (ref 36.0–46.0)
Hemoglobin: 14 g/dL (ref 12.0–15.0)
MCH: 30.1 pg (ref 26.0–34.0)
MCHC: 31.6 g/dL (ref 30.0–36.0)
MCV: 95.3 fL (ref 80.0–100.0)
Platelets: 313 10*3/uL (ref 150–400)
RBC: 4.65 MIL/uL (ref 3.87–5.11)
RDW: 13.6 % (ref 11.5–15.5)
WBC: 6.5 10*3/uL (ref 4.0–10.5)
nRBC: 0 % (ref 0.0–0.2)

## 2019-02-25 LAB — SARS CORONAVIRUS 2 (TAT 6-24 HRS): SARS Coronavirus 2: NEGATIVE

## 2019-02-25 NOTE — Progress Notes (Addendum)
Primary Care Physician: Leamon Arnt, MD Referring Physician: Dr. Chancy Hurter is a 69 y.o. female with a h/o paroxysmal afib that is in the afib clinic at pt's request as she went out of rhythm yesterday am. She  had an ablation 12/15. She  denies any shortness of breath, no extra  fluid. No triggers that she is aware of. She has taken extra 30 mg Cardizem yesterday and an extra 120 mg Cardizem this am but she persists in atrial flutter. This does not feel as uncomfortable to her that the afib did prior to ablation. She  continues on eliquis 5 mg bid for a CHA2DS2VASc score of at least 2.  She continues on her flecainide 50 mg bid. No swallowing or groin issues. EKG shows atrial flutter at 118 bpm.   Returns to clinic 12/31. 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 Marland Kitchen   Has not missed any anticoagulation x for at least 3 weeks.   F/u in afib clinic, 02/05/19. She had successful cardioversion and is staying in SR. She feels improved.   F/u in afib clinic, 2/1. She  went out of rhythm last Friday. Ablation 01/08/19. Extra Cardizem did not convert her. She  feels presyncopal with walking. Her v rate is 108 bpm today with a BP of 112/70. Last DCCV lasted almost one month. She  does not want to go to the ER for urgent cardioversion and  at this point does not  want to change antiarrythmic's. No  missed anticoagulation for CHA2DS2VASc score of 2.  Today, she denies symptoms of palpitations, chest pain, shortness of breath, orthopnea, PND, lower extremity edema, dizziness, presyncope, syncope, or neurologic sequela. The patient is tolerating medications without difficulties and is otherwise without complaint today.   Past Medical History:  Diagnosis Date  . AR (allergic rhinitis)   . Atrial fibrillation ([place])   . Clotting disorder ([place])   . Diverticulosis   . GERD  (gastroesophageal reflux disease)   . Hiatal hernia   . Hiatal hernia   . Hypothyroidism   . Migraines   . Mild sleep apnea   . Osteoarthritis   . Osteopenia after menopause 06/14/2017   DEXA 08/2016 [facility]: T = -1.10 radius, -1.0 femur; lumbar spine elevated due to DJD; recheck 2-3 years.  . Pacemaker    CHB  . Paroxysmal A-fib ([place])   . PUD (peptic ulcer disease)   . Sleep apnea   . Thyroid nodule   . Transient complete heart block 1996   Past Surgical History:  Procedure Laterality Date  . ATRIAL FIBRILLATION ABLATION N/A 01/08/2019   Procedure: ATRIAL FIBRILLATION ABLATION;  Surgeon: [name], MD;  Location: [place] [facility];  Service: Cardiovascular;  Laterality: N/A;  . CARDIOVERSION N/A 01/28/2019   Procedure: CARDIOVERSION;  Surgeon: Donato Heinz, MD;  Location: [facility] ENDOSCOPY;  Service: Endoscopy;  Laterality: N/A;  . HIP ARTHROPLASTY Right 2008  . PACEMAKER GENERATOR CHANGE  03/01/2007   performed by Dr Rosita Fire at [facility])  . PACEMAKER INSERTION  09/08/1994   performed in [facility] for transient complete heart block  . THYROIDECTOMY, PARTIAL  1986   BENIGN NODULES  . TOTAL HIP ARTHROPLASTY Left 02/13/2018   Procedure: TOTAL HIP ARTHROPLASTY ANTERIOR APPROACH;  Surgeon: Paralee Cancel, MD;  Location: [facility];  Service: Orthopedics;  Laterality: Left;  70 mins  . TUBAL LIGATION  1987    Current Outpatient Medications  Medication Sig Dispense Refill  . amoxicillin (AMOXIL) 500 MG capsule Take 2,000 mg by mouth See admin instructions. Take 4 capsules (2000 mg) by mouth 1 hour before dental visits    . Calcium Carb-Cholecalciferol (CALCIUM/VITAMIN D) 600-400 MG-UNIT TABS Take 1 tablet by mouth daily.     . Cyanocobalamin (B-12) 1000 MCG SUBL Place 1 mL under the tongue once a week.     . diclofenac sodium (VOLTAREN) 1 % GEL Apply 2 g topically 4 (four) times daily as needed. (Patient taking differently: Apply 2 g topically 4 (four) times daily  as needed (pain.). ) 100 g 5  . diltiazem (CARDIZEM CD) 120 MG 24 hr capsule Take 1 capsule (120 mg total) by mouth 2 (two) times daily. 180 capsule 2  . diltiazem (CARDIZEM) 30 MG tablet Take 1 tablet every 4 hours AS NEEDED for AFIB heart rate >100 (Patient taking differently: Take 30 mg by mouth every 4 (four) hours as needed (AFIB heart rate >100). ) 45 tablet 3  . ELIQUIS 5 MG TABS tablet TAKE 1 TABLET BY MOUTH TWICE DAILY. 180 tablet 1  . fexofenadine (ALLEGRA) 180 MG tablet Take 180 mg by mouth every evening.     . flecainide (TAMBOCOR) 50 MG tablet Take 1 tablet (50 mg total) by mouth 2 (two) times daily. 60 tablet 3  . levothyroxine (SYNTHROID) 125 MCG tablet TAKE 1 TABLET BY MOUTH DAILY. (Patient taking differently: Take 125 mcg by mouth daily before breakfast. ) 90 tablet 0  . Magnesium 250 MG TABS Take 250 mg by mouth every evening.     Marland Kitchen omeprazole (PRILOSEC) 20 MG capsule TAKE (1) CAPSULE DAILY. (Patient taking differently: Take 20 mg by mouth daily before breakfast. ) 90 capsule 3  . Potassium 99 MG TABS Take 99 mg by mouth daily.     . sodium chloride (OCEAN) 0.65 % SOLN nasal spray Place 1 spray into both nostrils 2 (two) times daily.     . traMADol (ULTRAM) 50 MG tablet Take 1 tablet (50 mg total) by mouth every 6 (six) hours as needed for moderate pain. (Patient taking differently: Take 50 mg by mouth every 12 (twelve) hours as needed for moderate pain. ) 30 tablet 0   No current facility-administered medications for this encounter.    Allergies  Allergen Reactions  . Morphine And Related Hives and Itching  . Neomycin Other (See Comments)    itching   . Adhesive [Tape] Rash    Blistering rash     Social History   Socioeconomic History  . Marital status: Married    Spouse name: Not on file  . Number of children: 0  . Years of education: Not on file  . Highest education level: Not on file  Occupational History  . Occupation: retired  Tobacco Use  . Smoking status:  Never Smoker  . Smokeless tobacco: Never Used  Substance and Sexual Activity  . Alcohol use: Not Currently    Alcohol/week: 1.0 standard drinks    Types: 1 Glasses of wine per week    Comment: occasionally  . Drug use: No  . Sexual activity: Not Currently  Other Topics Concern  . 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 Difficulty of Paying Living Expenses: Not on file  Food Insecurity:   . Worried About Charity fundraiser in the Last Year: Not on file  . Ran Out of Food in the Last Year: Not on file  Transportation Needs:   . Lack of Transportation (Medical): Not on file  . Lack of Transportation (Non-Medical): Not on file  Physical Activity:   . Days of Exercise per Week: Not on file  . Minutes of Exercise per Session: Not on file  Stress:   . Feeling of Stress : Not on file  Social Connections:   . Frequency of Communication with Friends and Family: Not on file  . Frequency of Social Gatherings with Friends and Family: Not on file  . Attends Religious Services: Not on file  . Active Member of Clubs or Organizations: Not on file  . Attends Archivist Meetings: Not on file  . Marital Status: Not on file  Intimate Partner Violence:   . Fear of Current or Ex-Partner: Not on file  . Emotionally Abused: Not on file  . Physically Abused: Not on file  . Sexually Abused: Not on file    Family History  Problem Relation Age of Onset  . Diabetes Mellitus II Maternal Uncle   . Cancer Maternal Grandmother        ENDOMETRIAL  . Rheum arthritis Mother   . Peptic Ulcer Mother   . COPD Brother   . Drug abuse Brother   . Breast cancer Neg Hx     ROS- All systems are reviewed and negative except as per the HPI above  Physical Exam: Vitals:   02/25/19 1039  BP: 112/70  Pulse: (!) 108  SpO2: 95%  Weight: 89.4 kg  Height: 5'  7" (1.702 m)   Wt Readings from Last 3 Encounters:  02/25/19 89.4 kg  02/05/19 91.6 kg  01/28/19 93.8 kg    Labs: Lab Results  Component Value Date   NA 135 01/24/2019   K 4.6 01/24/2019   CL 103 01/24/2019   CO2 23 01/24/2019   GLUCOSE 106 (H) 01/24/2019   BUN 16 01/24/2019   CREATININE 1.02 (H) 01/24/2019   CALCIUM 9.5 01/24/2019   Lab Results  Component Value Date   INR 0.87 02/13/2018   Lab Results  Component Value Date   CHOL 187 10/08/2018   HDL 73.50 10/08/2018   [place] 95 10/08/2018   TRIG 93.0 10/08/2018     GEN- The patient is well appearing, alert and oriented x 3 today.   Head- normocephalic, atraumatic Eyes-  Sclera clear, conjunctiva pink Ears- hearing intact Oropharynx- clear Neck- supple, no JVP Lymph- no cervical lymphadenopathy Lungs- Clear to ausculation bilaterally, normal work of breathing Heart- regular rate and rhythm, no murmurs, rubs or gallops, PMI not laterally displaced GI- soft, NT, ND, + BS Extremities- no clubbing, cyanosis, or edema MS- no significant deformity or atrophy Skin- no rash or lesion Psych- euthymic mood, full affect Neuro- strength and sensation are intact  EKG-atrial flutter at 108 bpm, IRBBB qrs int 96 bpm, qtc 450 ms  Epic records reviewed.    Assessment and Plan: 1. afib S/p ablation 01/08/19 Persistent  atrial flutter occurred several weeks out from ablation and received  successful cardioversion 01/28/19. Was  in [place] until 1/29 no trigger for recent flutter Extra CCB nor  extra flecainide did not help  to return to SR, I will not increase flecainide as she has IRBBB at  baseline  Discussed change of antiarrythmic or try another cardioversion, she would prefer the latter She would prefer not to go to ER Has elective cardioversion set up for 2/3.   I discussed with Dr. Rayann Heman and he is in agreement Cbc/bmet  2. CHA2DS2VASc score of 2 Continue eliquis 5 mg bid  States no missed doses  F/u with afib  clinic one week   Addendum: 1:25 pm - I did forward the EKG to Dr. Rayann Heman for review as it appeared a typical atrial flutter today. He agreed.  She  did not have a flutter ablation at time of her atrial fib ablation. He said to go ahead with plans for cardioversion but if arrhythmia returns quickly after this DCCV and if it still looks to be a typical atrial flutter,  he will  consider taking her back to the lab for a flutter ablation.   Geroge Baseman Alenah Sarria, [facility] [address] [place], [place] [address] [phone]

## 2019-02-25 NOTE — Patient Instructions (Signed)
Cardioversion scheduled for Wednesday, February 3rd  - Arrive at the [facility] and go to admitting at 1130AM  -Do not eat or drink anything after midnight the night prior to your procedure.  - Take all your morning medication with a sip of water prior to arrival.  - You will not be able to drive home after your procedure.

## 2019-02-25 NOTE — H&P (View-Only) (Signed)
Primary Care Physician: Leamon Arnt, MD Referring Physician: Dr. Chancy Hurter is a 69 y.o. female with a h/o paroxysmal afib that is in the afib clinic at pt's request as she went out of rhythm yesterday am. She  had an ablation 12/15. She  denies any shortness of breath, no extra  fluid. No triggers that she is aware of. She has taken extra 30 mg Cardizem yesterday and an extra 120 mg Cardizem this am but she persists in atrial flutter. This does not feel as uncomfortable to her that the afib did prior to ablation. She  continues on eliquis 5 mg bid for a CHA2DS2VASc score of at least 2.  She continues on her flecainide 50 mg bid. No swallowing or groin issues. EKG shows atrial flutter at 118 bpm.   Returns to clinic 12/31. 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 Marland Kitchen   Has not missed any anticoagulation x for at least 3 weeks.   F/u in afib clinic, 02/05/19. She had successful cardioversion and is staying in SR. She feels improved.   F/u in afib clinic, 2/1. She  went out of rhythm last Friday. Ablation 01/08/19. Extra Cardizem did not convert her. She  feels presyncopal with walking. Her v rate is 108 bpm today with a BP of 112/70. Last DCCV lasted almost one month. She  does not want to go to the ER for urgent cardioversion and  at this point does not  want to change antiarrythmic's. No  missed anticoagulation for CHA2DS2VASc score of 2.  Today, she denies symptoms of palpitations, chest pain, shortness of breath, orthopnea, PND, lower extremity edema, dizziness, presyncope, syncope, or neurologic sequela. The patient is tolerating medications without difficulties and is otherwise without complaint today.   Past Medical History:  Diagnosis Date  . AR (allergic rhinitis)   . Atrial fibrillation ([place])   . Clotting disorder ([place])   . Diverticulosis   . GERD  (gastroesophageal reflux disease)   . Hiatal hernia   . Hiatal hernia   . Hypothyroidism   . Migraines   . Mild sleep apnea   . Osteoarthritis   . Osteopenia after menopause 06/14/2017   DEXA 08/2016 [facility]: T = -1.10 radius, -1.0 femur; lumbar spine elevated due to DJD; recheck 2-3 years.  . Pacemaker    CHB  . Paroxysmal A-fib ([place])   . PUD (peptic ulcer disease)   . Sleep apnea   . Thyroid nodule   . Transient complete heart block 1996   Past Surgical History:  Procedure Laterality Date  . ATRIAL FIBRILLATION ABLATION N/A 01/08/2019   Procedure: ATRIAL FIBRILLATION ABLATION;  Surgeon: [name], MD;  Location: [place] [facility];  Service: Cardiovascular;  Laterality: N/A;  . CARDIOVERSION N/A 01/28/2019   Procedure: CARDIOVERSION;  Surgeon: Donato Heinz, MD;  Location: [facility] ENDOSCOPY;  Service: Endoscopy;  Laterality: N/A;  . HIP ARTHROPLASTY Right 2008  . PACEMAKER GENERATOR CHANGE  03/01/2007   performed by Dr Rosita Fire at [facility])  . PACEMAKER INSERTION  09/08/1994   performed in [facility] for transient complete heart block  . THYROIDECTOMY, PARTIAL  1986   BENIGN NODULES  . TOTAL HIP ARTHROPLASTY Left 02/13/2018   Procedure: TOTAL HIP ARTHROPLASTY ANTERIOR APPROACH;  Surgeon: Paralee Cancel, MD;  Location: [facility];  Service: Orthopedics;  Laterality: Left;  70 mins  . TUBAL LIGATION  1987    Current Outpatient Medications  Medication Sig Dispense Refill  . amoxicillin (AMOXIL) 500 MG capsule Take 2,000 mg by mouth See admin instructions. Take 4 capsules (2000 mg) by mouth 1 hour before dental visits    . Calcium Carb-Cholecalciferol (CALCIUM/VITAMIN D) 600-400 MG-UNIT TABS Take 1 tablet by mouth daily.     . Cyanocobalamin (B-12) 1000 MCG SUBL Place 1 mL under the tongue once a week.     . diclofenac sodium (VOLTAREN) 1 % GEL Apply 2 g topically 4 (four) times daily as needed. (Patient taking differently: Apply 2 g topically 4 (four) times daily  as needed (pain.). ) 100 g 5  . diltiazem (CARDIZEM CD) 120 MG 24 hr capsule Take 1 capsule (120 mg total) by mouth 2 (two) times daily. 180 capsule 2  . diltiazem (CARDIZEM) 30 MG tablet Take 1 tablet every 4 hours AS NEEDED for AFIB heart rate >100 (Patient taking differently: Take 30 mg by mouth every 4 (four) hours as needed (AFIB heart rate >100). ) 45 tablet 3  . ELIQUIS 5 MG TABS tablet TAKE 1 TABLET BY MOUTH TWICE DAILY. 180 tablet 1  . fexofenadine (ALLEGRA) 180 MG tablet Take 180 mg by mouth every evening.     . flecainide (TAMBOCOR) 50 MG tablet Take 1 tablet (50 mg total) by mouth 2 (two) times daily. 60 tablet 3  . levothyroxine (SYNTHROID) 125 MCG tablet TAKE 1 TABLET BY MOUTH DAILY. (Patient taking differently: Take 125 mcg by mouth daily before breakfast. ) 90 tablet 0  . Magnesium 250 MG TABS Take 250 mg by mouth every evening.     Marland Kitchen omeprazole (PRILOSEC) 20 MG capsule TAKE (1) CAPSULE DAILY. (Patient taking differently: Take 20 mg by mouth daily before breakfast. ) 90 capsule 3  . Potassium 99 MG TABS Take 99 mg by mouth daily.     . sodium chloride (OCEAN) 0.65 % SOLN nasal spray Place 1 spray into both nostrils 2 (two) times daily.     . traMADol (ULTRAM) 50 MG tablet Take 1 tablet (50 mg total) by mouth every 6 (six) hours as needed for moderate pain. (Patient taking differently: Take 50 mg by mouth every 12 (twelve) hours as needed for moderate pain. ) 30 tablet 0   No current facility-administered medications for this encounter.    Allergies  Allergen Reactions  . Morphine And Related Hives and Itching  . Neomycin Other (See Comments)    itching   . Adhesive [Tape] Rash    Blistering rash     Social History   Socioeconomic History  . Marital status: Married    Spouse name: Not on file  . Number of children: 0  . Years of education: Not on file  . Highest education level: Not on file  Occupational History  . Occupation: retired  Tobacco Use  . Smoking status:  Never Smoker  . Smokeless tobacco: Never Used  Substance and Sexual Activity  . Alcohol use: Not Currently    Alcohol/week: 1.0 standard drinks    Types: 1 Glasses of wine per week    Comment: occasionally  . Drug use: No  . Sexual activity: Not Currently  Other Topics Concern  . 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 Difficulty of Paying Living Expenses: Not on file  Food Insecurity:   . Worried About Charity fundraiser in the Last Year: Not on file  . Ran Out of Food in the Last Year: Not on file  Transportation Needs:   . Lack of Transportation (Medical): Not on file  . Lack of Transportation (Non-Medical): Not on file  Physical Activity:   . Days of Exercise per Week: Not on file  . Minutes of Exercise per Session: Not on file  Stress:   . Feeling of Stress : Not on file  Social Connections:   . Frequency of Communication with Friends and Family: Not on file  . Frequency of Social Gatherings with Friends and Family: Not on file  . Attends Religious Services: Not on file  . Active Member of Clubs or Organizations: Not on file  . Attends Archivist Meetings: Not on file  . Marital Status: Not on file  Intimate Partner Violence:   . Fear of Current or Ex-Partner: Not on file  . Emotionally Abused: Not on file  . Physically Abused: Not on file  . Sexually Abused: Not on file    Family History  Problem Relation Age of Onset  . Diabetes Mellitus II Maternal Uncle   . Cancer Maternal Grandmother        ENDOMETRIAL  . Rheum arthritis Mother   . Peptic Ulcer Mother   . COPD Brother   . Drug abuse Brother   . Breast cancer Neg Hx     ROS- All systems are reviewed and negative except as per the HPI above  Physical Exam: Vitals:   02/25/19 1039  BP: 112/70  Pulse: (!) 108  SpO2: 95%  Weight: 89.4 kg  Height: 5'  7" (1.702 m)   Wt Readings from Last 3 Encounters:  02/25/19 89.4 kg  02/05/19 91.6 kg  01/28/19 93.8 kg    Labs: Lab Results  Component Value Date   NA 135 01/24/2019   K 4.6 01/24/2019   CL 103 01/24/2019   CO2 23 01/24/2019   GLUCOSE 106 (H) 01/24/2019   BUN 16 01/24/2019   CREATININE 1.02 (H) 01/24/2019   CALCIUM 9.5 01/24/2019   Lab Results  Component Value Date   INR 0.87 02/13/2018   Lab Results  Component Value Date   CHOL 187 10/08/2018   HDL 73.50 10/08/2018   [place] 95 10/08/2018   TRIG 93.0 10/08/2018     GEN- The patient is well appearing, alert and oriented x 3 today.   Head- normocephalic, atraumatic Eyes-  Sclera clear, conjunctiva pink Ears- hearing intact Oropharynx- clear Neck- supple, no JVP Lymph- no cervical lymphadenopathy Lungs- Clear to ausculation bilaterally, normal work of breathing Heart- regular rate and rhythm, no murmurs, rubs or gallops, PMI not laterally displaced GI- soft, NT, ND, + BS Extremities- no clubbing, cyanosis, or edema MS- no significant deformity or atrophy Skin- no rash or lesion Psych- euthymic mood, full affect Neuro- strength and sensation are intact  EKG-atrial flutter at 108 bpm, IRBBB qrs int 96 bpm, qtc 450 ms  Epic records reviewed.    Assessment and Plan: 1. afib S/p ablation 01/08/19 Persistent  atrial flutter occurred several weeks out from ablation and received  successful cardioversion 01/28/19. Was  in [place] until 1/29 no trigger for recent flutter Extra CCB nor  extra flecainide did not help  to return to SR, I will not increase flecainide as she has IRBBB at  baseline  Discussed change of antiarrythmic or try another cardioversion, she would prefer the latter She would prefer not to go to ER Has elective cardioversion set up for 2/3.   I discussed with Dr. Rayann Heman and he is in agreement Cbc/bmet  2. CHA2DS2VASc score of 2 Continue eliquis 5 mg bid  States no missed doses  F/u with afib  clinic one week   Addendum: 1:25 pm - I did forward the EKG to Dr. Rayann Heman for review as it appeared a typical atrial flutter today. He agreed.  She  did not have a flutter ablation at time of her atrial fib ablation. He said to go ahead with plans for cardioversion but if arrhythmia returns quickly after this DCCV and if it still looks to be a typical atrial flutter,  he will  consider taking her back to the lab for a flutter ablation.   Geroge Baseman Oma Alpert, [facility] [address] [place], [place] [address] [phone]

## 2019-02-27 ENCOUNTER — Ambulatory Visit (HOSPITAL_COMMUNITY): Payer: Medicare PPO | Admitting: Certified Registered Nurse Anesthetist

## 2019-02-27 ENCOUNTER — Other Ambulatory Visit: Payer: Self-pay

## 2019-02-27 ENCOUNTER — Ambulatory Visit (HOSPITAL_COMMUNITY)
Admission: RE | Admit: 2019-02-27 | Discharge: 2019-02-27 | Disposition: A | Discharge status: Home / Self Care | Payer: Medicare PPO | Attending: Cardiology | Admitting: Cardiology

## 2019-02-27 ENCOUNTER — Encounter (HOSPITAL_COMMUNITY)
Admission: RE | Disposition: A | Discharge status: Home / Self Care | Payer: Self-pay | Source: Home / Self Care | Attending: Cardiology

## 2019-02-27 DIAGNOSIS — D689 Coagulation defect, unspecified: Secondary | ICD-10-CM | POA: Diagnosis not present

## 2019-02-27 DIAGNOSIS — K219 Gastro-esophageal reflux disease without esophagitis: Secondary | ICD-10-CM | POA: Diagnosis not present

## 2019-02-27 DIAGNOSIS — I442 Atrioventricular block, complete: Secondary | ICD-10-CM | POA: Insufficient documentation

## 2019-02-27 DIAGNOSIS — G4730 Sleep apnea, unspecified: Secondary | ICD-10-CM | POA: Insufficient documentation

## 2019-02-27 DIAGNOSIS — K5790 Diverticulosis of intestine, part unspecified, without perforation or abscess without bleeding: Secondary | ICD-10-CM | POA: Diagnosis not present

## 2019-02-27 DIAGNOSIS — E039 Hypothyroidism, unspecified: Secondary | ICD-10-CM | POA: Diagnosis not present

## 2019-02-27 DIAGNOSIS — Z79890 Hormone replacement therapy (postmenopausal): Secondary | ICD-10-CM | POA: Diagnosis not present

## 2019-02-27 DIAGNOSIS — I4892 Unspecified atrial flutter: Secondary | ICD-10-CM | POA: Diagnosis not present

## 2019-02-27 DIAGNOSIS — I4819 Other persistent atrial fibrillation: Secondary | ICD-10-CM | POA: Diagnosis not present

## 2019-02-27 DIAGNOSIS — Z950 Presence of cardiac pacemaker: Secondary | ICD-10-CM | POA: Insufficient documentation

## 2019-02-27 DIAGNOSIS — Z79899 Other long term (current) drug therapy: Secondary | ICD-10-CM | POA: Insufficient documentation

## 2019-02-27 DIAGNOSIS — Z7901 Long term (current) use of anticoagulants: Secondary | ICD-10-CM | POA: Diagnosis not present

## 2019-02-27 HISTORY — PX: CARDIOVERSION: SHX1299

## 2019-02-27 SURGERY — CARDIOVERSION
Anesthesia: General

## 2019-02-27 MED ORDER — SODIUM CHLORIDE 0.9 % IV SOLN: INTRAVENOUS | Status: DC | PRN

## 2019-02-27 MED ORDER — PROPOFOL 10 MG/ML IV BOLUS
INTRAVENOUS | Status: DC | PRN
  Administered 2019-02-27: 90 mg via INTRAVENOUS

## 2019-02-27 MED ORDER — SODIUM CHLORIDE 0.9 % IV SOLN
INTRAVENOUS | Status: AC | PRN
  Administered 2019-02-27: 500 mL via INTRAMUSCULAR

## 2019-02-27 MED ORDER — LIDOCAINE 2% (20 MG/ML) 5 ML SYRINGE
INTRAMUSCULAR | Status: DC | PRN
  Administered 2019-02-27: 40 mg via INTRAVENOUS

## 2019-02-27 NOTE — CV Procedure (Signed)
Procedure:   DCCV  Indication:  Symptomatic atrial flutter  Procedure Note:  The patient signed informed consent.  They have had had therapeutic anticoagulation with Eliquis greater than 3 weeks.  Anesthesia was administered by Dr. Jillyn Hidden.  Adequate airway was maintained throughout and vital followed per protocol.  They were cardioverted x 1 with 100J of biphasic synchronized energy.  They converted to NSR in 80s.  There were no apparent complications.  The patient had normal neuro status and respiratory status post procedure with vitals stable as recorded elsewhere.    Follow up:  They will continue on current medical therapy and follow up with cardiology as scheduled.  Oswaldo Milian, MD 02/27/2019 6:53 PM

## 2019-02-27 NOTE — Transfer of Care (Signed)
Immediate Anesthesia Transfer of Care Note  Patient: Kristin [name]  Procedure(s) Performed: CARDIOVERSION (N/A )  Patient Location: Endoscopy Unit  Anesthesia Type:MAC  Level of Consciousness: awake, alert  and oriented  Airway & Oxygen Therapy: Patient Spontanous Breathing  Post-op Assessment: Report given to RN and Post -op Vital signs reviewed and stable  Post vital signs: Reviewed and stable  Last Vitals:  Vitals Value Taken Time  BP 95/57   Temp    Pulse 88 02/27/19 1229  Resp 15 02/27/19 1229  SpO2 99 % 02/27/19 1229    Last Pain:  Vitals:   02/27/19 1140  TempSrc: Oral  PainSc: 2       Patients Stated Pain Goal: 0 (16/38/46 6599)  Complications: No apparent anesthesia complications

## 2019-02-27 NOTE — Anesthesia Preprocedure Evaluation (Signed)
Anesthesia Evaluation  Patient identified by MRN, date of birth, ID band Patient awake    Reviewed: Allergy & Precautions, H&P , NPO status , Patient's Chart, lab work & pertinent test results  Airway Mallampati: II   Neck ROM: full    Dental  (+) Teeth Intact   Pulmonary sleep apnea ,    breath sounds clear to auscultation       Cardiovascular + dysrhythmias Atrial Fibrillation + pacemaker  Rhythm:irregular Rate:Normal     Neuro/Psych  Headaches,    GI/Hepatic hiatal hernia, PUD, GERD  ,  Endo/Other  Hypothyroidism   Renal/GU      Musculoskeletal  (+) Arthritis ,   Abdominal Normal abdominal exam  (+)   Peds  Hematology   Anesthesia Other Findings   Reproductive/Obstetrics                             Anesthesia Physical  Anesthesia Plan  ASA: III  Anesthesia Plan: General   Post-op Pain Management:    Induction: Intravenous  PONV Risk Score and Plan: 3 and Propofol infusion and Treatment may vary due to age or medical condition  Airway Management Planned: Mask  Additional Equipment: None  Intra-op Plan:   Post-operative Plan:   Informed Consent: I have reviewed the patients History and Physical, chart, labs and discussed the procedure including the risks, benefits and alternatives for the proposed anesthesia with the patient or authorized representative who has indicated his/her understanding and acceptance.       Plan Discussed with: CRNA  Anesthesia Plan Comments:         Anesthesia Quick Evaluation

## 2019-02-27 NOTE — Discharge Instructions (Signed)
Electrical Cardioversion Electrical cardioversion is the delivery of a jolt of electricity to restore a normal rhythm to the heart. A rhythm that is too fast or is not regular keeps the heart from pumping well. In this procedure, sticky patches or metal paddles are placed on the chest to deliver electricity to the heart from a device. This procedure may be done in an emergency if:  There is low or no blood pressure as a result of the heart rhythm.  Normal rhythm must be restored as fast as possible to protect the brain and heart from further damage.  It may save a life. This may also be a scheduled procedure for irregular or fast heart rhythms that are not immediately life-threatening. Tell a health care provider about:  Any allergies you have.  All medicines you are taking, including vitamins, herbs, eye drops, creams, and over-the-counter medicines.  Any problems you or family members have had with anesthetic medicines.  Any blood disorders you have.  Any surgeries you have had.  Any medical conditions you have.  Whether you are pregnant or may be pregnant. What are the risks? Generally, this is a safe procedure. However, problems may occur, including:  Allergic reactions to medicines.  A blood clot that breaks free and travels to other parts of your body.  The possible return of an abnormal heart rhythm within hours or days after the procedure.  Your heart stopping (cardiac arrest). This is rare. What happens before the procedure? Medicines  Your health care provider may have you start taking: ? Blood-thinning medicines (anticoagulants) so your blood does not clot as easily. ? Medicines to help stabilize your heart rate and rhythm.  Ask your health care provider about: ? Changing or stopping your regular medicines. This is especially important if you are taking diabetes medicines or blood thinners. ? Taking medicines such as aspirin and ibuprofen. These medicines can  thin your blood. Do not take these medicines unless your health care provider tells you to take them. ? Taking over-the-counter medicines, vitamins, herbs, and supplements. General instructions  Follow instructions from your health care provider about eating or drinking restrictions.  Plan to have someone take you home from the hospital or clinic.  If you will be going home right after the procedure, plan to have someone with you for 24 hours.  Ask your health care provider what steps will be taken to help prevent infection. These may include washing your skin with a germ-killing soap. What happens during the procedure?   An IV will be inserted into one of your veins.  Sticky patches (electrodes) or metal paddles may be placed on your chest.  You will be given a medicine to help you relax (sedative).  An electrical shock will be delivered. The procedure may vary among health care providers and hospitals. What can I expect after the procedure?  Your blood pressure, heart rate, breathing rate, and blood oxygen level will be monitored until you leave the hospital or clinic.  Your heart rhythm will be watched to make sure it does not change.  You may have some redness on the skin where the shocks were given. Follow these instructions at home:  Do not drive for 24 hours if you were given a sedative during your procedure.  Take over-the-counter and prescription medicines only as told by your health care provider.  Ask your health care provider how to check your pulse. Check it often.  Rest for 48 hours after the procedure or   as told by your health care provider.  Avoid or limit your caffeine use as told by your health care provider.  Keep all follow-up visits as told by your health care provider. This is important. Contact a health care provider if:  You feel like your heart is beating too quickly or your pulse is not regular.  You have a serious muscle cramp that does not go  away. Get help right away if:  You have discomfort in your chest.  You are dizzy or you feel faint.  You have trouble breathing or you are short of breath.  Your speech is slurred.  You have trouble moving an arm or leg on one side of your body.  Your fingers or toes turn cold or blue. Summary  Electrical cardioversion is the delivery of a jolt of electricity to restore a normal rhythm to the heart.  This procedure may be done right away in an emergency or may be a scheduled procedure if the condition is not an emergency.  Generally, this is a safe procedure.  After the procedure, check your pulse often as told by your health care provider. This information is not intended to replace advice given to you by your health care provider. Make sure you discuss any questions you have with your health care provider. Document Revised: 08/13/2018 Document Reviewed: 08/13/2018 Elsevier Patient Education  2020 [employer]  

## 2019-02-27 NOTE — Anesthesia Postprocedure Evaluation (Signed)
Anesthesia Post Note  Patient: Kristin [name]  Procedure(s) Performed: CARDIOVERSION (N/A )     Patient location during evaluation: Endoscopy Anesthesia Type: General Level of consciousness: awake Pain management: pain level controlled Vital Signs Assessment: post-procedure vital signs reviewed and stable Respiratory status: spontaneous breathing Cardiovascular status: stable Postop Assessment: no apparent nausea or vomiting Anesthetic complications: no    Last Vitals:  Vitals:   02/27/19 1240 02/27/19 1250  BP: 96/61 (!) 96/54  Pulse: 83 82  Resp: 18 12  Temp:    SpO2: 100% 100%    Last Pain:  Vitals:   02/27/19 1230  TempSrc:   PainSc: 0-No pain   Pain Goal: Patients Stated Pain Goal: 0 (02/27/19 1140)                 Huston Foley

## 2019-03-01 NOTE — Interval H&P Note (Signed)
History and Physical Interval Note:  03/01/2019 9:46 PM  Dede Query Thammavong  has presented today for surgery, with the diagnosis of A-FIB.  The various methods of treatment have been discussed with the patient and family. After consideration of risks, benefits and other options for treatment, the patient has consented to  Procedure(s): CARDIOVERSION (N/A) as a surgical intervention.  The patient's history has been reviewed, patient examined, no change in status, stable for surgery.  I have reviewed the patient's chart and labs.  Questions were answered to the patient's satisfaction.     Donato Heinz

## 2019-03-06 ENCOUNTER — Ambulatory Visit (HOSPITAL_COMMUNITY)
Admission: RE | Admit: 2019-03-06 | Discharge: 2019-03-06 | Disposition: A | Discharge status: Home / Self Care | Payer: Medicare PPO | Source: Ambulatory Visit | Attending: Nurse Practitioner | Admitting: Nurse Practitioner

## 2019-03-06 ENCOUNTER — Other Ambulatory Visit: Payer: Self-pay

## 2019-03-06 ENCOUNTER — Encounter (HOSPITAL_COMMUNITY): Payer: Self-pay | Admitting: Nurse Practitioner

## 2019-03-06 VITALS — BP 114/76 | HR 64 | Ht 67.0 in | Wt 198.6 lb

## 2019-03-06 DIAGNOSIS — Z79899 Other long term (current) drug therapy: Secondary | ICD-10-CM | POA: Insufficient documentation

## 2019-03-06 DIAGNOSIS — M8580 Other specified disorders of bone density and structure, unspecified site: Secondary | ICD-10-CM | POA: Diagnosis not present

## 2019-03-06 DIAGNOSIS — I480 Paroxysmal atrial fibrillation: Secondary | ICD-10-CM | POA: Diagnosis present

## 2019-03-06 DIAGNOSIS — M1990 Unspecified osteoarthritis, unspecified site: Secondary | ICD-10-CM | POA: Insufficient documentation

## 2019-03-06 DIAGNOSIS — Z7901 Long term (current) use of anticoagulants: Secondary | ICD-10-CM | POA: Diagnosis not present

## 2019-03-06 DIAGNOSIS — I483 Typical atrial flutter: Secondary | ICD-10-CM | POA: Diagnosis not present

## 2019-03-06 DIAGNOSIS — K219 Gastro-esophageal reflux disease without esophagitis: Secondary | ICD-10-CM | POA: Insufficient documentation

## 2019-03-06 DIAGNOSIS — G4733 Obstructive sleep apnea (adult) (pediatric): Secondary | ICD-10-CM | POA: Insufficient documentation

## 2019-03-06 DIAGNOSIS — Z79890 Hormone replacement therapy (postmenopausal): Secondary | ICD-10-CM | POA: Insufficient documentation

## 2019-03-06 DIAGNOSIS — I4892 Unspecified atrial flutter: Secondary | ICD-10-CM | POA: Insufficient documentation

## 2019-03-06 DIAGNOSIS — D6869 Other thrombophilia: Secondary | ICD-10-CM | POA: Diagnosis not present

## 2019-03-06 DIAGNOSIS — G43909 Migraine, unspecified, not intractable, without status migrainosus: Secondary | ICD-10-CM | POA: Diagnosis not present

## 2019-03-06 DIAGNOSIS — K5790 Diverticulosis of intestine, part unspecified, without perforation or abscess without bleeding: Secondary | ICD-10-CM | POA: Insufficient documentation

## 2019-03-06 DIAGNOSIS — E039 Hypothyroidism, unspecified: Secondary | ICD-10-CM | POA: Diagnosis not present

## 2019-03-06 NOTE — Progress Notes (Signed)
Primary Care Physician: Leamon Arnt, MD Referring Physician: Dr. Chancy Hurter is a 68 y.o. female with a h/o paroxysmal afib that is in the afib clinic at pt's request as she went out of rhythm yesterday am. She  had an ablation 12/15. She  denies any shortness of breath, no extra  fluid. No triggers that she is aware of. She has taken extra 30 mg Cardizem yesterday and an extra 120 mg Cardizem this am but she persists in atrial flutter. This does not feel as uncomfortable to her that the afib did prior to ablation. She  continues on eliquis 5 mg bid for a CHA2DS2VASc score of at least 2.  She continues on her flecainide 50 mg bid. No swallowing or groin issues. EKG shows atrial flutter at 118 bpm.   Returns to clinic 12/31. 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 Marland Kitchen   Has not missed any anticoagulation x for at least 3 weeks.   F/u in afib clinic, 02/05/19. She had successful cardioversion and is staying in SR. She feels improved.   F/u in afib clinic, 2/1. She  went out of rhythm last Friday. Ablation 01/08/19. Extra Cardizem did not convert her. She  feels presyncopal with walking. Her v rate is 108 bpm today with a BP of 112/70. Last DCCV lasted almost one month. She  does not want to go to the ER for urgent cardioversion and  at this point does not  want to change antiarrythmic's. No  missed anticoagulation for CHA2DS2VASc score of 2.  F/u in afib clinic, 03/06/19. She had successful cardioversion 02/27/19. She is in SR today.   Today, she denies symptoms of palpitations, chest pain, shortness of breath, orthopnea, PND, lower extremity edema, dizziness, presyncope, syncope, or neurologic sequela. The patient is tolerating medications without difficulties and is otherwise without complaint today.   Past Medical History:  Diagnosis Date  . AR (allergic  rhinitis)   . Atrial fibrillation ([place])   . Clotting disorder ([place])   . Diverticulosis   . GERD (gastroesophageal reflux disease)   . Hiatal hernia   . Hiatal hernia   . Hypothyroidism   . Migraines   . Mild sleep apnea   . Osteoarthritis   . Osteopenia after menopause 06/14/2017   DEXA 08/2016 [facility]: T = -1.10 radius, -1.0 femur; lumbar spine elevated due to DJD; recheck 2-3 years.  . Pacemaker    CHB  . Paroxysmal A-fib ([place])   . PUD (peptic ulcer disease)   . Sleep apnea   . Thyroid nodule   . Transient complete heart block 1996   Past Surgical History:  Procedure Laterality Date  . ATRIAL FIBRILLATION ABLATION N/A 01/08/2019   Procedure: ATRIAL FIBRILLATION ABLATION;  Surgeon: [name], MD;  Location: [facility];  Service: Cardiovascular;  Laterality: N/A;  . CARDIOVERSION N/A 01/28/2019   Procedure: CARDIOVERSION;  Surgeon: Donato Heinz, MD;  Location: [facility] ENDOSCOPY;  Service: Endoscopy;  Laterality: N/A;  . CARDIOVERSION N/A 02/27/2019   Procedure: CARDIOVERSION;  Surgeon: Donato Heinz, MD;  Location: [facility] ENDOSCOPY;  Service: Endoscopy;  Laterality: N/A;  . HIP ARTHROPLASTY Right 2008  . PACEMAKER GENERATOR CHANGE  03/01/2007   performed by Dr Rosita Fire at [facility])  . PACEMAKER INSERTION  09/08/1994   performed in  [facility] for transient complete heart block  . THYROIDECTOMY, PARTIAL  1986   BENIGN NODULES  . TOTAL HIP ARTHROPLASTY Left 02/13/2018   Procedure: TOTAL HIP ARTHROPLASTY ANTERIOR APPROACH;  Surgeon: Paralee Cancel, MD;  Location: [facility] ORS;  Service: Orthopedics;  Laterality: Left;  70 mins  . TUBAL LIGATION  1987    Current Outpatient Medications  Medication Sig Dispense Refill  . amoxicillin (AMOXIL) 500 MG capsule Take 2,000 mg by mouth See admin instructions. Take 4 capsules (2000 mg) by mouth 1 hour before dental visits    . CALCIUM PO Take 400 mg by mouth daily.    . cholecalciferol (VITAMIN D) 25 MCG (1000  UNIT) tablet Take 100 Units by mouth daily.    . Cyanocobalamin (B-12) 1000 MCG SUBL Place 1 mL under the tongue once a week.     . diclofenac sodium (VOLTAREN) 1 % GEL Apply 2 g topically 4 (four) times daily as needed. (Patient taking differently: Apply 2 g topically as needed (pain.). ) 100 g 5  . diltiazem (CARDIZEM CD) 120 MG 24 hr capsule Take 1 capsule (120 mg total) by mouth 2 (two) times daily. 180 capsule 2  . diltiazem (CARDIZEM) 30 MG tablet Take 1 tablet every 4 hours AS NEEDED for AFIB heart rate >100 (Patient taking differently: No sig reported) 45 tablet 3  . ELIQUIS 5 MG TABS tablet TAKE 1 TABLET BY MOUTH TWICE DAILY. 180 tablet 1  . fexofenadine (ALLEGRA) 180 MG tablet Take 180 mg by mouth every evening.     . flecainide (TAMBOCOR) 50 MG tablet Take 1 tablet (50 mg total) by mouth 2 (two) times daily. 60 tablet 3  . levothyroxine (SYNTHROID) 125 MCG tablet TAKE 1 TABLET BY MOUTH DAILY. 90 tablet 0  . Magnesium 250 MG TABS Take 250 mg by mouth every evening.     Marland Kitchen omeprazole (PRILOSEC) 20 MG capsule TAKE (1) CAPSULE DAILY. 90 capsule 3  . Potassium 99 MG TABS Take 99 mg by mouth daily.     . sodium chloride (OCEAN) 0.65 % SOLN nasal spray Place 1 spray into both nostrils 2 (two) times daily.     . traMADol (ULTRAM) 50 MG tablet Take 1 tablet (50 mg total) by mouth every 6 (six) hours as needed for moderate pain. (Patient taking differently: Take 50 mg by mouth as needed for moderate pain. ) 30 tablet 0   No current facility-administered medications for this encounter.    Allergies  Allergen Reactions  . Morphine And Related Hives and Itching  . Neomycin Other (See Comments)    itching   . Adhesive [Tape] Rash    Blistering rash     Social History   Socioeconomic History  . Marital status: Married    Spouse name: Not on file  . Number of children: 0  . Years of education: Not on file  . Highest education level: Not on file  Occupational History  . Occupation:  retired  Tobacco Use  . Smoking status: Never Smoker  . Smokeless tobacco: Never Used  Substance and Sexual Activity  . Alcohol use: Not Currently    Alcohol/week: 1.0 standard drinks    Types: 1 Glasses of wine per week    Comment: occasionally  . Drug use: No  . Sexual activity: Not Currently  Other Topics Concern  . 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 Difficulty of Paying Living Expenses: Not on file  Food Insecurity:   . Worried About Charity fundraiser in the Last Year: Not on file  . Ran Out of Food in the Last Year: Not on file  Transportation Needs:   . Lack of Transportation (Medical): Not on file  . Lack of Transportation (Non-Medical): Not on file  Physical Activity:   . Days of Exercise per Week: Not on file  . Minutes of Exercise per Session: Not on file  Stress:   . Feeling of Stress : Not on file  Social Connections:   . Frequency of Communication with Friends and Family: Not on file  . Frequency of Social Gatherings with Friends and Family: Not on file  . Attends Religious Services: Not on file  . Active Member of Clubs or Organizations: Not on file  . Attends Archivist Meetings: Not on file  . Marital Status: Not on file  Intimate Partner Violence:   . Fear of Current or Ex-Partner: Not on file  . Emotionally Abused: Not on file  . Physically Abused: Not on file  . Sexually Abused: Not on file    Family History  Problem Relation Age of Onset  . Diabetes Mellitus II Maternal Uncle   . Cancer Maternal Grandmother        ENDOMETRIAL  . Rheum arthritis Mother   . Peptic Ulcer Mother   . COPD Brother   . Drug abuse Brother   . Breast cancer Neg Hx     ROS- All systems are reviewed and negative except as per the HPI above  Physical Exam: Vitals:   03/06/19 1128  BP: 114/76  Pulse: 64    Weight: 90.1 kg  Height: 5\' 7"  (1.702 m)   Wt Readings from Last 3 Encounters:  03/06/19 90.1 kg  02/27/19 89.4 kg  02/25/19 89.4 kg    Labs: Lab Results  Component Value Date   NA 136 02/25/2019   K 4.5 02/25/2019   CL 103 02/25/2019   CO2 17 (L) 02/25/2019   GLUCOSE 158 (H) 02/25/2019   BUN 11 02/25/2019   CREATININE 1.00 02/25/2019   CALCIUM 9.7 02/25/2019   Lab Results  Component Value Date   INR 0.87 02/13/2018   Lab Results  Component Value Date   CHOL 187 10/08/2018   HDL 73.50 10/08/2018   Hydro 95 10/08/2018   TRIG 93.0 10/08/2018     GEN- The patient is well appearing, alert and oriented x 3 today.   Head- normocephalic, atraumatic Eyes-  Sclera clear, conjunctiva pink Ears- hearing intact Oropharynx- clear Neck- supple, no JVP Lymph- no cervical lymphadenopathy Lungs- Clear to ausculation bilaterally, normal work of breathing Heart- regular rate and rhythm, no murmurs, rubs or gallops, PMI not laterally displaced GI- soft, NT, ND, + BS Extremities- no clubbing, cyanosis, or edema MS- no significant deformity or atrophy Skin- no rash or lesion Psych- euthymic mood, full affect Neuro- strength and sensation are intact  EKG- typical atrial flutter at 108 bpm, IRBBB qrs int 96 bpm, qtc 450 ms  Epic records reviewed.    Assessment and Plan: 1. afib S/p ablation 01/08/19 Persistent  atrial flutter occurred several weeks out from ablation and received  successful cardioversion 01/28/19. Then had typical flutter with RVR, successful cardioversion 02/27/19.   Extra CCB nor  extra flecainide did not help  to return to SR, I will not  increase flecainide as she has IRBBB at baseline    I did forward the last  EKG to Dr. Rayann Heman for review as it appeared a typical atrial flutter today. He agreed.  She  did not have a flutter ablation at time of her atrial fib ablation. He said if typical atrail flutter  arrhythmia returns quickly after this DCCV and if it  still looks to be a typical atrial flutter,  he will  consider taking her back to the lab for a flutter ablation.    2. CHA2DS2VASc score of 2 Continue eliquis 5 mg bid   F/u with Dr. Rayann Heman 04/08/19   Geroge Baseman. Tytianna Greenley, [facility] [address] [place], [place] [address] [phone]

## 2019-03-06 NOTE — H&P (View-Only) (Signed)
Primary Care Physician: Leamon Arnt, MD Referring Physician: Dr. Chancy Hurter is a 69 y.o. female with a h/o paroxysmal afib that is in the afib clinic at pt's request as she went out of rhythm yesterday am. She  had an ablation 12/15. She  denies any shortness of breath, no extra  fluid. No triggers that she is aware of. She has taken extra 30 mg Cardizem yesterday and an extra 120 mg Cardizem this am but she persists in atrial flutter. This does not feel as uncomfortable to her that the afib did prior to ablation. She  continues on eliquis 5 mg bid for a CHA2DS2VASc score of at least 2.  She continues on her flecainide 50 mg bid. No swallowing or groin issues. EKG shows atrial flutter at 118 bpm.   Returns to clinic 12/31. 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 Marland Kitchen   Has not missed any anticoagulation x for at least 3 weeks.   F/u in afib clinic, 02/05/19. She had successful cardioversion and is staying in SR. She feels improved.   F/u in afib clinic, 2/1. She  went out of rhythm last Friday. Ablation 01/08/19. Extra Cardizem did not convert her. She  feels presyncopal with walking. Her v rate is 108 bpm today with a BP of 112/70. Last DCCV lasted almost one month. She  does not want to go to the ER for urgent cardioversion and  at this point does not  want to change antiarrythmic's. No  missed anticoagulation for CHA2DS2VASc score of 2.  F/u in afib clinic, 03/06/19. She had successful cardioversion 02/27/19. She is in SR today.   Today, she denies symptoms of palpitations, chest pain, shortness of breath, orthopnea, PND, lower extremity edema, dizziness, presyncope, syncope, or neurologic sequela. The patient is tolerating medications without difficulties and is otherwise without complaint today.   Past Medical History:  Diagnosis Date  . AR (allergic  rhinitis)   . Atrial fibrillation ([place])   . Clotting disorder ([place])   . Diverticulosis   . GERD (gastroesophageal reflux disease)   . Hiatal hernia   . Hiatal hernia   . Hypothyroidism   . Migraines   . Mild sleep apnea   . Osteoarthritis   . Osteopenia after menopause 06/14/2017   DEXA 08/2016 [facility]: T = -1.10 radius, -1.0 femur; lumbar spine elevated due to DJD; recheck 2-3 years.  . Pacemaker    CHB  . Paroxysmal A-fib ([place])   . PUD (peptic ulcer disease)   . Sleep apnea   . Thyroid nodule   . Transient complete heart block 1996   Past Surgical History:  Procedure Laterality Date  . ATRIAL FIBRILLATION ABLATION N/A 01/08/2019   Procedure: ATRIAL FIBRILLATION ABLATION;  Surgeon: [name], MD;  Location: [facility];  Service: Cardiovascular;  Laterality: N/A;  . CARDIOVERSION N/A 01/28/2019   Procedure: CARDIOVERSION;  Surgeon: Donato Heinz, MD;  Location: [facility] ENDOSCOPY;  Service: Endoscopy;  Laterality: N/A;  . CARDIOVERSION N/A 02/27/2019   Procedure: CARDIOVERSION;  Surgeon: Donato Heinz, MD;  Location: [facility] ENDOSCOPY;  Service: Endoscopy;  Laterality: N/A;  . HIP ARTHROPLASTY Right 2008  . PACEMAKER GENERATOR CHANGE  03/01/2007   performed [facility] Dr Rosita Fire at [facility])  . PACEMAKER INSERTION  09/08/1994   performed in  [facility] for transient complete heart block  . THYROIDECTOMY, PARTIAL  1986   BENIGN NODULES  . TOTAL HIP ARTHROPLASTY Left 02/13/2018   Procedure: TOTAL HIP ARTHROPLASTY ANTERIOR APPROACH;  Surgeon: Paralee Cancel, MD;  Location: [facility] ORS;  Service: Orthopedics;  Laterality: Left;  70 mins  . TUBAL LIGATION  1987    Current Outpatient Medications  Medication Sig Dispense Refill  . amoxicillin (AMOXIL) 500 MG capsule Take 2,000 mg by mouth See admin instructions. Take 4 capsules (2000 mg) by mouth 1 hour before dental visits    . CALCIUM PO Take 400 mg by mouth daily.    . cholecalciferol (VITAMIN D) 25 MCG (1000  UNIT) tablet Take 100 Units by mouth daily.    . Cyanocobalamin (B-12) 1000 MCG SUBL Place 1 mL under the tongue once a week.     . diclofenac sodium (VOLTAREN) 1 % GEL Apply 2 g topically 4 (four) times daily as needed. (Patient taking differently: Apply 2 g topically as needed (pain.). ) 100 g 5  . diltiazem (CARDIZEM CD) 120 MG 24 hr capsule Take 1 capsule (120 mg total) by mouth 2 (two) times daily. 180 capsule 2  . diltiazem (CARDIZEM) 30 MG tablet Take 1 tablet every 4 hours AS NEEDED for AFIB heart rate >100 (Patient taking differently: No sig reported) 45 tablet 3  . ELIQUIS 5 MG TABS tablet TAKE 1 TABLET BY MOUTH TWICE DAILY. 180 tablet 1  . fexofenadine (ALLEGRA) 180 MG tablet Take 180 mg by mouth every evening.     . flecainide (TAMBOCOR) 50 MG tablet Take 1 tablet (50 mg total) by mouth 2 (two) times daily. 60 tablet 3  . levothyroxine (SYNTHROID) 125 MCG tablet TAKE 1 TABLET BY MOUTH DAILY. 90 tablet 0  . Magnesium 250 MG TABS Take 250 mg by mouth every evening.     Marland Kitchen omeprazole (PRILOSEC) 20 MG capsule TAKE (1) CAPSULE DAILY. 90 capsule 3  . Potassium 99 MG TABS Take 99 mg by mouth daily.     . sodium chloride (OCEAN) 0.65 % SOLN nasal spray Place 1 spray into both nostrils 2 (two) times daily.     . traMADol (ULTRAM) 50 MG tablet Take 1 tablet (50 mg total) by mouth every 6 (six) hours as needed for moderate pain. (Patient taking differently: Take 50 mg by mouth as needed for moderate pain. ) 30 tablet 0   No current facility-administered medications for this encounter.    Allergies  Allergen Reactions  . Morphine And Related Hives and Itching  . Neomycin Other (See Comments)    itching   . Adhesive [Tape] Rash    Blistering rash     Social History   Socioeconomic History  . Marital status: Married    Spouse name: Not on file  . Number of children: 0  . Years of education: Not on file  . Highest education level: Not on file  Occupational History  . Occupation:  retired  Tobacco Use  . Smoking status: Never Smoker  . Smokeless tobacco: Never Used  Substance and Sexual Activity  . Alcohol use: Not Currently    Alcohol/week: 1.0 standard drinks    Types: 1 Glasses of wine per week    Comment: occasionally  . Drug use: No  . Sexual activity: Not Currently  Other Topics Concern  . 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 Difficulty of Paying Living Expenses: Not on file  Food Insecurity:   . Worried About Charity fundraiser in the Last Year: Not on file  . Ran Out of Food in the Last Year: Not on file  Transportation Needs:   . Lack of Transportation (Medical): Not on file  . Lack of Transportation (Non-Medical): Not on file  Physical Activity:   . Days of Exercise per Week: Not on file  . Minutes of Exercise per Session: Not on file  Stress:   . Feeling of Stress : Not on file  Social Connections:   . Frequency of Communication with Friends and Family: Not on file  . Frequency of Social Gatherings with Friends and Family: Not on file  . Attends Religious Services: Not on file  . Active Member of Clubs or Organizations: Not on file  . Attends Archivist Meetings: Not on file  . Marital Status: Not on file  Intimate Partner Violence:   . Fear of Current or Ex-Partner: Not on file  . Emotionally Abused: Not on file  . Physically Abused: Not on file  . Sexually Abused: Not on file    Family History  Problem Relation Age of Onset  . Diabetes Mellitus II Maternal Uncle   . Cancer Maternal Grandmother        ENDOMETRIAL  . Rheum arthritis Mother   . Peptic Ulcer Mother   . COPD Brother   . Drug abuse Brother   . Breast cancer Neg Hx     ROS- All systems are reviewed and negative except as per the HPI above  Physical Exam: Vitals:   03/06/19 1128  BP: 114/76  Pulse: 64    Weight: 90.1 kg  Height: 5\' 7"  (1.702 m)   Wt Readings from Last 3 Encounters:  03/06/19 90.1 kg  02/27/19 89.4 kg  02/25/19 89.4 kg    Labs: Lab Results  Component Value Date   NA 136 02/25/2019   K 4.5 02/25/2019   CL 103 02/25/2019   CO2 17 (L) 02/25/2019   GLUCOSE 158 (H) 02/25/2019   BUN 11 02/25/2019   CREATININE 1.00 02/25/2019   CALCIUM 9.7 02/25/2019   Lab Results  Component Value Date   INR 0.87 02/13/2018   Lab Results  Component Value Date   CHOL 187 10/08/2018   HDL 73.50 10/08/2018   Mora 95 10/08/2018   TRIG 93.0 10/08/2018     GEN- The patient is well appearing, alert and oriented x 3 today.   Head- normocephalic, atraumatic Eyes-  Sclera clear, conjunctiva pink Ears- hearing intact Oropharynx- clear Neck- supple, no JVP Lymph- no cervical lymphadenopathy Lungs- Clear to ausculation bilaterally, normal work of breathing Heart- regular rate and rhythm, no murmurs, rubs or gallops, PMI not laterally displaced GI- soft, NT, ND, + BS Extremities- no clubbing, cyanosis, or edema MS- no significant deformity or atrophy Skin- no rash or lesion Psych- euthymic mood, full affect Neuro- strength and sensation are intact  EKG- typical atrial flutter at 108 bpm, IRBBB qrs int 96 bpm, qtc 450 ms  Epic records reviewed.    Assessment and Plan: 1. afib S/p ablation 01/08/19 Persistent  atrial flutter occurred several weeks out from ablation and received  successful cardioversion 01/28/19. Then had typical flutter with RVR, successful cardioversion 02/27/19.   Extra CCB nor  extra flecainide did not help  to return to SR, I will not  increase flecainide as she has IRBBB at baseline    I did forward the last  EKG to Dr. Rayann Heman for review as it appeared a typical atrial flutter today. He agreed.  She  did not have a flutter ablation at time of her atrial fib ablation. He said if typical atrail flutter  arrhythmia returns quickly after this DCCV and if it  still looks to be a typical atrial flutter,  he will  consider taking her back to the lab for a flutter ablation.    2. CHA2DS2VASc score of 2 Continue eliquis 5 mg bid   F/u with Dr. Rayann Heman 04/08/19   Geroge Baseman. Alexandera Kuntzman, [facility] [address] [place], [place] [address] [phone]

## 2019-03-12 ENCOUNTER — Ambulatory Visit (INDEPENDENT_AMBULATORY_CARE_PROVIDER_SITE_OTHER): Payer: Medicare PPO | Admitting: *Deleted

## 2019-03-12 DIAGNOSIS — I442 Atrioventricular block, complete: Secondary | ICD-10-CM

## 2019-03-12 LAB — CUP PACEART REMOTE DEVICE CHECK
Battery Impedance: 4760 Ohm
Battery Remaining Longevity: 17 mo
Battery Voltage: 2.67 V
Brady Statistic RV Percent Paced: 0 %
Date Time Interrogation Session: 20210216141155
Implantable Lead Implant Date: 19960815
Implantable Lead Implant Date: 19960815
Implantable Lead Location: 753859
Implantable Lead Location: 753860
Implantable Lead Model: 5024
Implantable Pulse Generator Implant Date: 20090205
Lead Channel Impedance Value: 67 Ohm
Lead Channel Impedance Value: 762 Ohm
Lead Channel Pacing Threshold Amplitude: 1.375 V
Lead Channel Pacing Threshold Pulse Width: 0.4 ms
Lead Channel Setting Pacing Amplitude: 2.75 V
Lead Channel Setting Pacing Pulse Width: 0.4 ms
Lead Channel Setting Sensing Sensitivity: 2.8 mV

## 2019-03-13 NOTE — Progress Notes (Signed)
PPM Remote  

## 2019-03-19 ENCOUNTER — Ambulatory Visit (HOSPITAL_COMMUNITY)
Admission: RE | Admit: 2019-03-19 | Discharge: 2019-03-19 | Disposition: A | Discharge status: Home / Self Care | Payer: Medicare PPO | Source: Ambulatory Visit | Attending: Nurse Practitioner | Admitting: Nurse Practitioner

## 2019-03-19 ENCOUNTER — Other Ambulatory Visit: Payer: Self-pay

## 2019-03-19 ENCOUNTER — Telehealth: Payer: Self-pay

## 2019-03-19 VITALS — BP 112/72 | HR 106

## 2019-03-19 DIAGNOSIS — I4891 Unspecified atrial fibrillation: Secondary | ICD-10-CM | POA: Diagnosis present

## 2019-03-19 DIAGNOSIS — I4510 Unspecified right bundle-branch block: Secondary | ICD-10-CM | POA: Diagnosis not present

## 2019-03-19 DIAGNOSIS — R9431 Abnormal electrocardiogram [ECG] [EKG]: Secondary | ICD-10-CM | POA: Insufficient documentation

## 2019-03-19 DIAGNOSIS — I4892 Unspecified atrial flutter: Secondary | ICD-10-CM

## 2019-03-19 MED ORDER — DILTIAZEM HCL ER COATED BEADS 120 MG PO CP24: 120.0000 mg | ORAL_CAPSULE | Freq: Two times a day (BID) | ORAL | 2 refills | Status: DC

## 2019-03-19 NOTE — Progress Notes (Signed)
Pt in for ekg as she went back out of rhythm this am. Coincidentally she had her second covid shot yesterday.Last covid shot, she went out or rhythm within 3 days.  Her ekg today appears to be more an atypical flutter but her last ekg looked more typical. She has already been cardioverted x 2, 1/4 and 2/3. EKG reviewed with Dr. Allred and this EKG appears less typical than her lst EKG that appeared to be more typcial. He is willing to take her back to the lab, map her and see if flutter ablation is indicated. His nurse witll set this up. Pt would like to go back to lab.  She is very symptomatic with being out of rhythm.   EKG reads 2/2 flutter waves acute MI, but this is not consistent with clinical picture with pt not having any chest pain or other symptoms associated with MI.  

## 2019-03-19 NOTE — Telephone Encounter (Signed)
-----   Message from Juluis Mire, RN sent at 03/19/2019 11:41 AM EST ----- Regarding: ablation Per donna who just talked to allred - he said to schedule mrs Ing "like af ablation" but no tee/ct Thanks Marzetta Board

## 2019-03-20 NOTE — Telephone Encounter (Signed)
Pt scheduled for aflutter/afib ablation on April 02, 2019 at 7:30 am  Labs/covid test scheduled  Instruction letter sent via mychart.  Work up complete.

## 2019-03-21 ENCOUNTER — Other Ambulatory Visit: Payer: Self-pay | Admitting: Family Medicine

## 2019-03-29 ENCOUNTER — Other Ambulatory Visit: Payer: Medicare PPO | Admitting: *Deleted

## 2019-03-29 ENCOUNTER — Other Ambulatory Visit: Payer: Self-pay

## 2019-03-29 ENCOUNTER — Other Ambulatory Visit (HOSPITAL_COMMUNITY)
Admission: RE | Admit: 2019-03-29 | Discharge: 2019-03-29 | Disposition: A | Discharge status: Home / Self Care | Payer: Medicare PPO | Source: Ambulatory Visit | Attending: Internal Medicine | Admitting: Internal Medicine

## 2019-03-29 DIAGNOSIS — Z01812 Encounter for preprocedural laboratory examination: Secondary | ICD-10-CM | POA: Insufficient documentation

## 2019-03-29 DIAGNOSIS — I4892 Unspecified atrial flutter: Secondary | ICD-10-CM

## 2019-03-29 DIAGNOSIS — Z20822 Contact with and (suspected) exposure to covid-19: Secondary | ICD-10-CM | POA: Diagnosis not present

## 2019-03-29 LAB — CBC WITH DIFFERENTIAL/PLATELET
Basophils Absolute: 0.1 10*3/uL (ref 0.0–0.2)
Basos: 1 %
EOS (ABSOLUTE): 0.1 10*3/uL (ref 0.0–0.4)
Eos: 1 %
Hematocrit: 37.8 % (ref 34.0–46.6)
Hemoglobin: 12.9 g/dL (ref 11.1–15.9)
Immature Grans (Abs): 0 10*3/uL (ref 0.0–0.1)
Immature Granulocytes: 0 %
Lymphocytes Absolute: 1 10*3/uL (ref 0.7–3.1)
Lymphs: 22 %
MCH: 30.6 pg (ref 26.6–33.0)
MCHC: 34.1 g/dL (ref 31.5–35.7)
MCV: 90 fL (ref 79–97)
Monocytes Absolute: 0.4 10*3/uL (ref 0.1–0.9)
Monocytes: 9 %
Neutrophils Absolute: 3.1 10*3/uL (ref 1.4–7.0)
Neutrophils: 67 %
Platelets: 283 10*3/uL (ref 150–450)
RBC: 4.22 x10E6/uL (ref 3.77–5.28)
RDW: 13.1 % (ref 11.7–15.4)
WBC: 4.7 10*3/uL (ref 3.4–10.8)

## 2019-03-29 LAB — BASIC METABOLIC PANEL
BUN/Creatinine Ratio: 16 (ref 12–28)
BUN: 15 mg/dL (ref 8–27)
CO2: 20 mmol/L (ref 20–29)
Calcium: 9.6 mg/dL (ref 8.7–10.3)
Chloride: 100 mmol/L (ref 96–106)
Creatinine, Ser: 0.93 mg/dL (ref 0.57–1.00)
GFR calc Af Amer: 74 mL/min/{1.73_m2} (ref >59)
GFR calc non Af Amer: 64 mL/min/{1.73_m2} (ref >59)
Glucose: 94 mg/dL (ref 65–99)
Potassium: 4.2 mmol/L (ref 3.5–5.2)
Sodium: 138 mmol/L (ref 134–144)

## 2019-03-29 LAB — SARS CORONAVIRUS 2 (TAT 6-24 HRS): SARS Coronavirus 2: NEGATIVE

## 2019-04-01 NOTE — Progress Notes (Signed)
Instructed patient on the following items: Arrival time 0530 Nothing to eat or drink after midnight No meds AM of procedure Responsible person to drive you home and stay with you for 24 hrs  Have you missed any doses of anti-coagulant on Eliquis- haven't missed any doses, make sure to take Eliquis today, but don't take in the morning

## 2019-04-02 ENCOUNTER — Encounter (HOSPITAL_COMMUNITY): Payer: Self-pay | Admitting: Internal Medicine

## 2019-04-02 ENCOUNTER — Ambulatory Visit (HOSPITAL_COMMUNITY)
Admission: RE | Admit: 2019-04-02 | Discharge: 2019-04-02 | Disposition: A | Discharge status: Home / Self Care | Payer: Medicare PPO | Attending: Internal Medicine | Admitting: Internal Medicine

## 2019-04-02 ENCOUNTER — Ambulatory Visit (HOSPITAL_COMMUNITY): Payer: Medicare PPO | Admitting: Anesthesiology

## 2019-04-02 ENCOUNTER — Other Ambulatory Visit: Payer: Self-pay

## 2019-04-02 ENCOUNTER — Encounter (HOSPITAL_COMMUNITY)
Admission: RE | Disposition: A | Discharge status: Home / Self Care | Payer: Self-pay | Source: Home / Self Care | Attending: Internal Medicine

## 2019-04-02 DIAGNOSIS — Z7901 Long term (current) use of anticoagulants: Secondary | ICD-10-CM | POA: Insufficient documentation

## 2019-04-02 DIAGNOSIS — K219 Gastro-esophageal reflux disease without esophagitis: Secondary | ICD-10-CM | POA: Diagnosis not present

## 2019-04-02 DIAGNOSIS — Z79890 Hormone replacement therapy (postmenopausal): Secondary | ICD-10-CM | POA: Diagnosis not present

## 2019-04-02 DIAGNOSIS — I483 Typical atrial flutter: Secondary | ICD-10-CM | POA: Insufficient documentation

## 2019-04-02 DIAGNOSIS — E039 Hypothyroidism, unspecified: Secondary | ICD-10-CM | POA: Diagnosis not present

## 2019-04-02 DIAGNOSIS — Z79899 Other long term (current) drug therapy: Secondary | ICD-10-CM | POA: Diagnosis not present

## 2019-04-02 DIAGNOSIS — I4891 Unspecified atrial fibrillation: Secondary | ICD-10-CM

## 2019-04-02 DIAGNOSIS — G4730 Sleep apnea, unspecified: Secondary | ICD-10-CM | POA: Insufficient documentation

## 2019-04-02 DIAGNOSIS — I4819 Other persistent atrial fibrillation: Secondary | ICD-10-CM | POA: Diagnosis not present

## 2019-04-02 HISTORY — PX: ATRIAL FIBRILLATION ABLATION: EP1191

## 2019-04-02 SURGERY — ATRIAL FIBRILLATION ABLATION
Anesthesia: General

## 2019-04-02 MED ORDER — DEXAMETHASONE SODIUM PHOSPHATE 10 MG/ML IJ SOLN
INTRAMUSCULAR | Status: DC | PRN
  Administered 2019-04-02: 8 mg via INTRAVENOUS

## 2019-04-02 MED ORDER — ONDANSETRON HCL 4 MG/2ML IJ SOLN
INTRAMUSCULAR | Status: DC | PRN
  Administered 2019-04-02: 4 mg via INTRAVENOUS

## 2019-04-02 MED ORDER — ROCURONIUM BROMIDE 10 MG/ML (PF) SYRINGE
PREFILLED_SYRINGE | INTRAVENOUS | Status: DC | PRN
  Administered 2019-04-02: 20 mg via INTRAVENOUS
  Administered 2019-04-02: 10 mg via INTRAVENOUS
  Administered 2019-04-02: 60 mg via INTRAVENOUS

## 2019-04-02 MED ORDER — PHENYLEPHRINE HCL-NACL 10-0.9 MG/250ML-% IV SOLN
INTRAVENOUS | Status: DC | PRN
  Administered 2019-04-02: 20 ug/min via INTRAVENOUS

## 2019-04-02 MED ORDER — HEPARIN SODIUM (PORCINE) 1000 UNIT/ML IJ SOLN
INTRAMUSCULAR | Status: AC
  Filled 2019-04-02: qty 1

## 2019-04-02 MED ORDER — PROPOFOL 10 MG/ML IV BOLUS
INTRAVENOUS | Status: DC | PRN
  Administered 2019-04-02: 130 mg via INTRAVENOUS

## 2019-04-02 MED ORDER — SODIUM CHLORIDE 0.9% FLUSH: 3.0000 mL | INTRAVENOUS | Status: DC | PRN

## 2019-04-02 MED ORDER — LIDOCAINE 2% (20 MG/ML) 5 ML SYRINGE
INTRAMUSCULAR | Status: DC | PRN
  Administered 2019-04-02: 60 mg via INTRAVENOUS

## 2019-04-02 MED ORDER — ONDANSETRON HCL 4 MG/2ML IJ SOLN: 4.0000 mg | Freq: Once | INTRAMUSCULAR | Status: DC | PRN

## 2019-04-02 MED ORDER — ACETAMINOPHEN 325 MG PO TABS: 650.0000 mg | ORAL_TABLET | ORAL | Status: DC | PRN

## 2019-04-02 MED ORDER — SODIUM CHLORIDE 0.9 % IV SOLN: 250.0000 mL | INTRAVENOUS | Status: DC | PRN

## 2019-04-02 MED ORDER — PROTAMINE SULFATE 10 MG/ML IV SOLN
INTRAVENOUS | Status: DC | PRN
  Administered 2019-04-02: 30 mg via INTRAVENOUS

## 2019-04-02 MED ORDER — HYDROCODONE-ACETAMINOPHEN 5-325 MG PO TABS: 1.0000 | ORAL_TABLET | ORAL | Status: DC | PRN

## 2019-04-02 MED ORDER — HEPARIN (PORCINE) IN NACL 1000-0.9 UT/500ML-% IV SOLN
INTRAVENOUS | Status: DC | PRN
  Administered 2019-04-02: 500 mL

## 2019-04-02 MED ORDER — SODIUM CHLORIDE 0.9% FLUSH: 3.0000 mL | Freq: Two times a day (BID) | INTRAVENOUS | Status: DC

## 2019-04-02 MED ORDER — HEPARIN SODIUM (PORCINE) 1000 UNIT/ML IJ SOLN
INTRAMUSCULAR | Status: DC | PRN
  Administered 2019-04-02: 1000 [IU] via INTRAVENOUS
  Administered 2019-04-02: 14000 [IU] via INTRAVENOUS
  Administered 2019-04-02: 1000 [IU] via INTRAVENOUS

## 2019-04-02 MED ORDER — FENTANYL CITRATE (PF) 250 MCG/5ML IJ SOLN
INTRAMUSCULAR | Status: DC | PRN
  Administered 2019-04-02: 50 ug via INTRAVENOUS

## 2019-04-02 MED ORDER — OXYCODONE HCL 5 MG/5ML PO SOLN: 5.0000 mg | Freq: Once | ORAL | Status: DC | PRN

## 2019-04-02 MED ORDER — MIDAZOLAM HCL 2 MG/2ML IJ SOLN
INTRAMUSCULAR | Status: DC | PRN
  Administered 2019-04-02 (×2): 1 mg via INTRAVENOUS

## 2019-04-02 MED ORDER — PHENYLEPHRINE 40 MCG/ML (10ML) SYRINGE FOR IV PUSH (FOR BLOOD PRESSURE SUPPORT)
PREFILLED_SYRINGE | INTRAVENOUS | Status: DC | PRN
  Administered 2019-04-02: 40 ug via INTRAVENOUS
  Administered 2019-04-02 (×4): 80 ug via INTRAVENOUS

## 2019-04-02 MED ORDER — ONDANSETRON HCL 4 MG/2ML IJ SOLN: 4.0000 mg | Freq: Four times a day (QID) | INTRAMUSCULAR | Status: DC | PRN

## 2019-04-02 MED ORDER — SUGAMMADEX SODIUM 200 MG/2ML IV SOLN
INTRAVENOUS | Status: DC | PRN
  Administered 2019-04-02: 200 mg via INTRAVENOUS

## 2019-04-02 MED ORDER — HEPARIN SODIUM (PORCINE) 1000 UNIT/ML IJ SOLN
INTRAMUSCULAR | Status: AC
  Filled 2019-04-02: qty 2

## 2019-04-02 MED ORDER — OXYCODONE HCL 5 MG PO TABS: 5.0000 mg | ORAL_TABLET | Freq: Once | ORAL | Status: DC | PRN

## 2019-04-02 MED ORDER — FENTANYL CITRATE (PF) 100 MCG/2ML IJ SOLN: 25.0000 ug | INTRAMUSCULAR | Status: DC | PRN

## 2019-04-02 MED ORDER — SODIUM CHLORIDE 0.9 % IV SOLN: INTRAVENOUS | Status: DC

## 2019-04-02 MED ORDER — HEPARIN (PORCINE) IN NACL 1000-0.9 UT/500ML-% IV SOLN
INTRAVENOUS | Status: AC
  Filled 2019-04-02: qty 500

## 2019-04-02 SURGICAL SUPPLY — 19 items
CATH MAPPNG PENTARAY F 2-6-2MM (CATHETERS) ×1 IMPLANT
CATH NAVISTAR SMARTTOUCH FJ (ABLATOR) IMPLANT
CATH SMTCH THERMOCOOL SF DF (CATHETERS) ×2 IMPLANT
CATH SOUNDSTAR ECO 8FR (CATHETERS) ×2 IMPLANT
CATH WEBSTER BI DIR CS D-F CRV (CATHETERS) ×2 IMPLANT
COVER SWIFTLINK CONNECTOR (BAG) ×2 IMPLANT
DEVICE CLOSURE PERCLS PRGLD 6F (VASCULAR PRODUCTS) ×2 IMPLANT
NEEDLE BAYLIS TRANSSEPTAL 71CM (NEEDLE) ×2 IMPLANT
PACK EP LATEX FREE (CUSTOM PROCEDURE TRAY) ×2
PACK EP LF (CUSTOM PROCEDURE TRAY) ×1 IMPLANT
PAD PRO RADIOLUCENT 2001M-C (PAD) ×2 IMPLANT
PATCH CARTO3 (PAD) ×2 IMPLANT
PENTARAY F 2-6-2MM (CATHETERS) ×2
PERCLOSE PROGLIDE 6F (VASCULAR PRODUCTS) ×4
SHEATH PINNACLE 7F 10CM (SHEATH) ×4 IMPLANT
SHEATH PINNACLE 9F 10CM (SHEATH) ×2 IMPLANT
SHEATH PROBE COVER 6X72 (BAG) ×2 IMPLANT
SHEATH SWARTZ TS SL2 63CM 8.5F (SHEATH) ×2 IMPLANT
TUBING SMART ABLATE COOLFLOW (TUBING) ×2 IMPLANT

## 2019-04-02 NOTE — Transfer of Care (Signed)
Immediate Anesthesia Transfer of Care Note  Patient: Kristin [name]  Procedure(s) Performed: ATRIAL FIBRILLATION ABLATION (N/A )  Patient Location: PACU and Cath Lab  Anesthesia Type:Regional  Level of Consciousness: awake, alert , oriented, patient cooperative and responds to stimulation  Airway & Oxygen Therapy: Patient Spontanous Breathing and Patient connected to nasal cannula oxygen  Post-op Assessment: Report given to RN and Post -op Vital signs reviewed and stable  Post vital signs: Reviewed and stable  Last Vitals:  Vitals Value Taken Time  BP    Temp    Pulse 83 04/02/19 1144  Resp 13 04/02/19 1144  SpO2 100 % 04/02/19 1144  Vitals shown include unvalidated device data.  Last Pain:  Vitals:   04/02/19 0601  TempSrc:   PainSc: 0-No pain      Patients Stated Pain Goal: 3 ([id number])  Complications: No apparent anesthesia complications

## 2019-04-02 NOTE — Discharge Instructions (Signed)

## 2019-04-02 NOTE — Anesthesia Procedure Notes (Signed)
Procedure Name: Intubation Date/Time: 04/02/2019 7:45 AM Performed by: Verdie Drown, CRNA Pre-anesthesia Checklist: Patient identified, Emergency Drugs available, Suction available and Patient being monitored Patient Re-evaluated:Patient Re-evaluated prior to induction Oxygen Delivery Method: Circle System Utilized Preoxygenation: Pre-oxygenation with 100% oxygen Induction Type: IV induction Ventilation: Mask ventilation without difficulty Laryngoscope Size: Mac and 3 Grade View: Grade II Tube type: Oral Tube size: 7.0 mm Number of attempts: 1 Airway Equipment and Method: Stylet and Oral airway Placement Confirmation: ETT inserted through vocal cords under direct vision,  positive ETCO2 and breath sounds checked- equal and bilateral Secured at: 22 cm Tube secured with: Tape Dental Injury: Teeth and Oropharynx as per pre-operative assessment

## 2019-04-02 NOTE — Anesthesia Postprocedure Evaluation (Signed)
Anesthesia Post Note  Patient: Tansy Brownson Hellstrom  Procedure(s) Performed: ATRIAL FIBRILLATION ABLATION (N/A )     Anesthesia Type: General Level of consciousness: awake and alert Pain management: pain level controlled Vital Signs Assessment: post-procedure vital signs reviewed and stable Respiratory status: spontaneous breathing, nonlabored ventilation and respiratory function stable Cardiovascular status: blood pressure returned to baseline and stable Postop Assessment: no apparent nausea or vomiting Anesthetic complications: no    Last Vitals:  Vitals:   04/02/19 1215 04/02/19 1230  BP: (!) 88/53 (!) 91/56  Pulse: 85 85  Resp: 11 13  Temp:  36.8 C  SpO2: 96% 97%    Last Pain:  Vitals:   04/02/19 1245  TempSrc:   PainSc: 0-No pain                 Lidia Collum

## 2019-04-02 NOTE — Interval H&P Note (Signed)
History and Physical Interval Note:  04/02/2019 7:24 AM  Kristin [name]  has presented today for surgery, with the diagnosis of Atrial Flutter and atrial fibrilation.  The various methods of treatment have been discussed with the patient and family. After consideration of risks, benefits and other options for treatment, the patient has consented to  Procedure(s): ATRIAL FIBRILLATION ABLATION (N/A) as a surgical intervention.  The patient's history has been reviewed, patient examined, no change in status, stable for surgery.  I have reviewed the patient's chart and labs.  Questions were answered to the patient's satisfaction.    The patient has symptomatic, recurrent atrial fibrillation and atrial flutter post ablation. she has failed medical therapy with flecainide.  she is anticoagulated with eliquis.  She reports compliance without interruption. Therapeutic strategies for afib/ atrial flutter including medicine and ablation were discussed in detail with the patient today. Risk, benefits, and alternatives to EP study and radiofrequency ablation were also discussed in detail today. These risks include but are not limited to stroke, bleeding, vascular damage, tamponade, perforation, damage to the esophagus, lungs, and other structures, pulmonary vein stenosis, pacemaker lead dislodgement, worsening renal function, and death. The patient understands these risk and wishes to proceed.       Co Sign: [name], MD 04/02/2019 7:25 AM   [name]

## 2019-04-02 NOTE — Anesthesia Preprocedure Evaluation (Signed)
Anesthesia Evaluation  Patient identified by MRN, date of birth, ID band Patient awake    Reviewed: Allergy & Precautions, NPO status , Patient's Chart, lab work & pertinent test results  History of Anesthesia Complications Negative for: history of anesthetic complications  Airway Mallampati: II  TM Distance: >3 FB Neck ROM: Full    Dental  (+) Teeth Intact   Pulmonary sleep apnea ,    Pulmonary exam normal        Cardiovascular + dysrhythmias Atrial Fibrillation + pacemaker  Rhythm:Irregular Rate:Tachycardia     Neuro/Psych negative neurological ROS  negative psych ROS   GI/Hepatic Neg liver ROS, hiatal hernia, PUD, GERD  ,  Endo/Other  Hypothyroidism   Renal/GU negative Renal ROS  negative genitourinary   Musculoskeletal  (+) Arthritis , Osteoarthritis,    Abdominal   Peds  Hematology negative hematology ROS (+)   Anesthesia Other Findings  Echo 07/24/18: EF 60-65%, mild LAE, mild MVP  Reproductive/Obstetrics                          Anesthesia Physical Anesthesia Plan  ASA: III  Anesthesia Plan: General   Post-op Pain Management:    Induction: Intravenous  PONV Risk Score and Plan: 3 and Ondansetron, Dexamethasone, Treatment may vary due to age or medical condition and Midazolam  Airway Management Planned: Oral ETT  Additional Equipment: None  Intra-op Plan:   Post-operative Plan: Extubation in OR  Informed Consent: I have reviewed the patients History and Physical, chart, labs and discussed the procedure including the risks, benefits and alternatives for the proposed anesthesia with the patient or authorized representative who has indicated his/her understanding and acceptance.     Dental advisory given  Plan Discussed with:   Anesthesia Plan Comments:        Anesthesia Quick Evaluation

## 2019-04-03 LAB — POCT ACTIVATED CLOTTING TIME: Activated Clotting Time: 268 seconds

## 2019-04-08 ENCOUNTER — Ambulatory Visit: Payer: Medicare Other | Admitting: Internal Medicine

## 2019-04-18 ENCOUNTER — Other Ambulatory Visit (HOSPITAL_COMMUNITY): Payer: Self-pay | Admitting: Physician Assistant

## 2019-04-30 ENCOUNTER — Ambulatory Visit (HOSPITAL_COMMUNITY)
Admission: RE | Admit: 2019-04-30 | Discharge: 2019-04-30 | Disposition: A | Discharge status: Home / Self Care | Payer: Medicare PPO | Source: Ambulatory Visit | Attending: Nurse Practitioner | Admitting: Nurse Practitioner

## 2019-04-30 ENCOUNTER — Other Ambulatory Visit: Payer: Self-pay

## 2019-04-30 VITALS — BP 104/64 | HR 83 | Ht 67.0 in | Wt 199.4 lb

## 2019-04-30 DIAGNOSIS — Z8379 Family history of other diseases of the digestive system: Secondary | ICD-10-CM | POA: Diagnosis not present

## 2019-04-30 DIAGNOSIS — I442 Atrioventricular block, complete: Secondary | ICD-10-CM | POA: Insufficient documentation

## 2019-04-30 DIAGNOSIS — Z96643 Presence of artificial hip joint, bilateral: Secondary | ICD-10-CM | POA: Diagnosis not present

## 2019-04-30 DIAGNOSIS — E039 Hypothyroidism, unspecified: Secondary | ICD-10-CM | POA: Insufficient documentation

## 2019-04-30 DIAGNOSIS — Z888 Allergy status to other drugs, medicaments and biological substances status: Secondary | ICD-10-CM | POA: Diagnosis not present

## 2019-04-30 DIAGNOSIS — G4730 Sleep apnea, unspecified: Secondary | ICD-10-CM | POA: Insufficient documentation

## 2019-04-30 DIAGNOSIS — D6869 Other thrombophilia: Secondary | ICD-10-CM | POA: Diagnosis not present

## 2019-04-30 DIAGNOSIS — Z7901 Long term (current) use of anticoagulants: Secondary | ICD-10-CM | POA: Diagnosis not present

## 2019-04-30 DIAGNOSIS — K219 Gastro-esophageal reflux disease without esophagitis: Secondary | ICD-10-CM | POA: Diagnosis not present

## 2019-04-30 DIAGNOSIS — Z950 Presence of cardiac pacemaker: Secondary | ICD-10-CM | POA: Diagnosis not present

## 2019-04-30 DIAGNOSIS — Z825 Family history of asthma and other chronic lower respiratory diseases: Secondary | ICD-10-CM | POA: Diagnosis not present

## 2019-04-30 DIAGNOSIS — M1990 Unspecified osteoarthritis, unspecified site: Secondary | ICD-10-CM | POA: Insufficient documentation

## 2019-04-30 DIAGNOSIS — Z885 Allergy status to narcotic agent status: Secondary | ICD-10-CM | POA: Diagnosis not present

## 2019-04-30 DIAGNOSIS — Z79899 Other long term (current) drug therapy: Secondary | ICD-10-CM | POA: Insufficient documentation

## 2019-04-30 DIAGNOSIS — Z79890 Hormone replacement therapy (postmenopausal): Secondary | ICD-10-CM | POA: Insufficient documentation

## 2019-04-30 DIAGNOSIS — I483 Typical atrial flutter: Secondary | ICD-10-CM | POA: Diagnosis not present

## 2019-04-30 DIAGNOSIS — I480 Paroxysmal atrial fibrillation: Secondary | ICD-10-CM | POA: Diagnosis present

## 2019-04-30 DIAGNOSIS — Z8049 Family history of malignant neoplasm of other genital organs: Secondary | ICD-10-CM | POA: Diagnosis not present

## 2019-05-01 ENCOUNTER — Encounter (HOSPITAL_COMMUNITY): Payer: Self-pay | Admitting: Nurse Practitioner

## 2019-05-01 NOTE — Progress Notes (Signed)
Primary Care Physician: Leamon Arnt, MD Referring Physician: Dr. Chancy Hurter is a 70 y.o. female with a h/o paroxysmal afib that is in the afib clinic at pt's request as she went out of rhythm yesterday am. She  had an ablation 12/15. She  denies any shortness of breath, no extra  fluid. No triggers that she is aware of. She has taken extra 30 mg Cardizem yesterday and an extra 120 mg Cardizem this am but she persists in atrial flutter. This does not feel as uncomfortable to her that the afib did prior to ablation. She  continues on eliquis 5 mg bid for a CHA2DS2VASc score of at least 2.  She continues on her flecainide 50 mg bid. No swallowing or groin issues. EKG shows atrial flutter at 118 bpm.   Returns to clinic 12/31. 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 Marland Kitchen   Has not missed any anticoagulation x for at least 3 weeks.   F/u in afib clinic, 02/05/19. She had successful cardioversion and is staying in SR. She feels improved.   F/u in afib clinic, 2/1. She  went out of rhythm last Friday. Ablation 01/08/19. Extra Cardizem did not convert her. She  feels presyncopal with walking. Her v rate is 108 bpm today with a BP of 112/70. Last DCCV lasted almost one month. She  does not want to go to the ER for urgent cardioversion and  at this point does not  want to change antiarrythmic's. No  missed anticoagulation for CHA2DS2VASc score of 2.  F/u in afib clinic, 03/06/19. She had successful cardioversion 02/27/19. She is in SR today.   F/u in afib clinic 04/30/19. She is now one month out from  her second ablation. She had a typical atrial flutter ablation and some atypical flutter  circuits were found and ablated as well although there were a few atypical flutter circuits that could not be treated as too close to phrenic nerve. She has enjoyed an arrhythmia free period  since 2nd ablation. She is feeling improved.   Today, she denies symptoms of palpitations, chest pain, shortness of breath, orthopnea, PND, lower extremity edema, dizziness, presyncope, syncope, or neurologic sequela. The patient is tolerating medications without difficulties and is otherwise without complaint today.   Past Medical History:  Diagnosis Date  . AR (allergic rhinitis)   . Atrial fibrillation ([place])   . Clotting disorder ([place])   . Diverticulosis   . GERD (gastroesophageal reflux disease)   . Hiatal hernia   . Hiatal hernia   . Hypothyroidism   . Migraines   . Mild sleep apnea   . Osteoarthritis   . Osteopenia after menopause 06/14/2017   DEXA 08/2016 [facility]: T = -1.10 radius, -1.0 femur; lumbar spine elevated due to DJD; recheck 2-3 years.  . Pacemaker    CHB  . Paroxysmal A-fib ([place])   . PUD (peptic ulcer disease)   . Sleep apnea   . Thyroid nodule   . Transient complete heart block 1996   Past Surgical History:  Procedure Laterality Date  . ATRIAL FIBRILLATION ABLATION N/A 01/08/2019   Procedure: ATRIAL FIBRILLATION ABLATION;  Surgeon: [name], MD;  Location: [place] [facility] LAB;  Service: Cardiovascular;  Laterality: N/A;  . ATRIAL FIBRILLATION ABLATION N/A 04/02/2019   Procedure: ATRIAL FIBRILLATION ABLATION;  Surgeon: [name], MD;  Location: [place] [facility] LAB;  Service: Cardiovascular;  Laterality: N/A;  . CARDIOVERSION N/A 01/28/2019   Procedure: CARDIOVERSION;  Surgeon: Donato Heinz, MD;  Location: [facility] ENDOSCOPY;  Service: Endoscopy;  Laterality: N/A;  . CARDIOVERSION N/A 02/27/2019   Procedure: CARDIOVERSION;  Surgeon: Donato Heinz, MD;  Location: [facility] ENDOSCOPY;  Service: Endoscopy;  Laterality: N/A;  . HIP ARTHROPLASTY Right 2008  . PACEMAKER GENERATOR CHANGE  03/01/2007   performed by Dr Rosita Fire at [facility])  . PACEMAKER INSERTION  09/08/1994   performed in [facility] for transient complete heart block  .  THYROIDECTOMY, PARTIAL  1986   BENIGN NODULES  . TOTAL HIP ARTHROPLASTY Left 02/13/2018   Procedure: TOTAL HIP ARTHROPLASTY ANTERIOR APPROACH;  Surgeon: Paralee Cancel, MD;  Location: [facility];  Service: Orthopedics;  Laterality: Left;  70 mins  . TUBAL LIGATION  1987    Current Outpatient Medications  Medication Sig Dispense Refill  . amoxicillin (AMOXIL) 500 MG capsule Take 2,000 mg by mouth See admin instructions. Take 4 capsules (2000 mg) by mouth 1 hour before dental visits    . CALCIUM PO Take 400 mg by mouth daily.    . cholecalciferol (VITAMIN D) 25 MCG (1000 UNIT) tablet Take 1,000 Units by mouth daily.     . Cyanocobalamin (B-12) 1000 MCG SUBL Place 1 mL under the tongue once a week.     . diclofenac sodium (VOLTAREN) 1 % GEL Apply 2 g topically 4 (four) times daily as needed. (Patient taking differently: Apply 2 g topically 4 (four) times daily as needed (pain.). ) 100 g 5  . diltiazem (CARDIZEM CD) 120 MG 24 hr capsule Take 1 capsule (120 mg total) by mouth 2 (two) times daily. May take extra 1 tablet daily for breakthrough afib. 190 capsule 2  . diltiazem (CARDIZEM) 30 MG tablet Take 1 tablet every 4 hours AS NEEDED for AFIB heart rate >100 (Patient taking differently: No sig reported) 45 tablet 3  . ELIQUIS 5 MG TABS tablet TAKE 1 TABLET BY MOUTH TWICE DAILY. 180 tablet 1  . fexofenadine (ALLEGRA) 180 MG tablet Take 180 mg by mouth every evening.     . flecainide (TAMBOCOR) 50 MG tablet TAKE 1 TABLET BY MOUTH TWICE DAILY. 60 tablet 3  . levothyroxine (SYNTHROID) 125 MCG tablet TAKE 1 TABLET BY MOUTH DAILY. (Patient taking differently: Take 125 mcg by mouth daily before breakfast. ) 90 tablet 0  . Magnesium 250 MG TABS Take 250 mg by mouth every evening.     Marland Kitchen omeprazole (PRILOSEC) 20 MG capsule TAKE (1) CAPSULE DAILY. (Patient taking differently: Take 20 mg by mouth daily before breakfast. ) 90 capsule 3  . Potassium 99 MG TABS Take 99 mg by mouth daily.     . sodium chloride (OCEAN)  0.65 % SOLN nasal spray Place 1 spray into both nostrils 2 (two) times daily.     . traMADol (ULTRAM) 50 MG tablet Take 1 tablet (50 mg total) by mouth every 6 (six) hours as needed for moderate pain. 30 tablet 0   No current facility-administered medications for this encounter.    Allergies  Allergen Reactions  . Morphine And Related Hives and Itching  . Neomycin Other (See Comments)    itching   . Adhesive [Tape] Rash    Blistering rash     Social History   Socioeconomic History  . Marital status: Married    Spouse name: Not on file  .  Number of children: 0  . Years of education: Not on file  . Highest education level: Not on file  Occupational History  . Occupation: retired  Tobacco Use  . Smoking status: Never Smoker  . Smokeless tobacco: Never Used  Substance and Sexual Activity  . Alcohol use: Not Currently    Alcohol/week: 1.0 standard drinks    Types: 1 Glasses of wine per week    Comment: occasionally  . Drug use: No  . Sexual activity: Not Currently  Other Topics Concern  . 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 Difficulty of Paying Living Expenses:   Food Insecurity:   . Worried About Charity fundraiser in the Last Year:   . Arboriculturist in the Last Year:   Transportation Needs:   . Film/video editor (Medical):   Marland Kitchen Lack of Transportation (Non-Medical):   Physical Activity:   . Days of Exercise per Week:   . Minutes of Exercise per Session:   Stress:   . Feeling of Stress :   Social Connections:   . Frequency of Communication with Friends and Family:   . Frequency of Social Gatherings with Friends and Family:   . Attends Religious Services:   . Active Member of Clubs or Organizations:   . Attends Archivist Meetings:   Marland Kitchen Marital Status:   Intimate Partner Violence:   . Fear of Current or  Ex-Partner:   . Emotionally Abused:   Marland Kitchen Physically Abused:   . Sexually Abused:     Family History  Problem Relation Age of Onset  . Diabetes Mellitus II Maternal Uncle   . Cancer Maternal Grandmother        ENDOMETRIAL  . Rheum arthritis Mother   . Peptic Ulcer Mother   . COPD Brother   . Drug abuse Brother   . Breast cancer Neg Hx     ROS- All systems are reviewed and negative except as per the HPI above  Physical Exam: Vitals:   04/30/19 1135  BP: 104/64  Pulse: 83  Weight: 90.4 kg  Height: 5\' 7"  (1.702 m)   Wt Readings from Last 3 Encounters:  04/30/19 90.4 kg  04/02/19 88.5 kg  03/06/19 90.1 kg    Labs: Lab Results  Component Value Date   NA 138 03/29/2019   K 4.2 03/29/2019   CL 100 03/29/2019   CO2 20 03/29/2019   GLUCOSE 94 03/29/2019   BUN 15 03/29/2019   CREATININE 0.93 03/29/2019   CALCIUM 9.6 03/29/2019   Lab Results  Component Value Date   INR 0.87 02/13/2018   Lab Results  Component Value Date   CHOL 187 10/08/2018   HDL 73.50 10/08/2018   [place] 95 10/08/2018   TRIG 93.0 10/08/2018     GEN- The patient is well appearing, alert and oriented x 3 today.   Head- normocephalic, atraumatic Eyes-  Sclera clear, conjunctiva pink Ears- hearing intact Oropharynx- clear Neck- supple, no JVP Lymph- no cervical lymphadenopathy Lungs- Clear to ausculation bilaterally, normal work of breathing Heart- regular rate and rhythm, no murmurs, rubs or gallops, PMI not laterally displaced GI- soft, NT, ND, + BS Extremities- no clubbing, cyanosis, or edema MS- no significant deformity or atrophy Skin- no rash or lesion Psych- euthymic mood, full affect Neuro- strength and  sensation are intact  EKG- NSR at 81 bpm  Epic records reviewed.    Assessment and Plan: 1. afib S/p ablation 01/08/19 Persistent  atrial flutter occurred several weeks out from ablation and received  successful cardioversion 01/28/19. Then had typical flutter with RVR,  successful cardioversion 02/27/19.  Then had recurrence of arrhythmia, went back for EP study with ablation of typical and atypical flutter Maintaining  SR and feels improved    2. CHA2DS2VASc score of 2 Continue eliquis 5 mg bid   F/u with Dr. Rayann Heman 07/17/19   Geroge Baseman. Willim Turnage, [facility] [address] [place], [place] [address] [phone]

## 2019-05-10 ENCOUNTER — Telehealth (HOSPITAL_COMMUNITY): Payer: Self-pay | Admitting: *Deleted

## 2019-05-10 NOTE — Telephone Encounter (Signed)
Patient called in back in AF since 6am with HRs in the 100-110s. She has taken 3 doses of PRN cardizem 30mg  tabs without relief. HR 104 BP 127/82 pt is very tearful on the phone, some shortness of breath and weakness with exertion but ok if staying still. Discussed with Roderic Palau NP will take 1 time dose of 50mg  of flecainide now and continue with PRN cardizem for rate control. IF still out of rhythm on Monday will bring in for outpatient cardioversion scheduling. ER precautions reviewed with patient. Pt upset shes back out of rhythm since she had been doing so well but agreeable to the plan.

## 2019-05-13 ENCOUNTER — Other Ambulatory Visit: Payer: Self-pay

## 2019-05-13 ENCOUNTER — Encounter (HOSPITAL_COMMUNITY): Payer: Self-pay | Admitting: Student

## 2019-05-13 ENCOUNTER — Emergency Department (HOSPITAL_COMMUNITY): Payer: Medicare PPO

## 2019-05-13 ENCOUNTER — Emergency Department (HOSPITAL_COMMUNITY)
Admission: EM | Admit: 2019-05-13 | Discharge: 2019-05-13 | Disposition: A | Discharge status: Home / Self Care | Payer: Medicare PPO | Attending: Emergency Medicine | Admitting: Emergency Medicine

## 2019-05-13 ENCOUNTER — Ambulatory Visit (HOSPITAL_COMMUNITY)
Admission: RE | Admit: 2019-05-13 | Discharge: 2019-05-13 | Disposition: A | Discharge status: Home / Self Care | Payer: Medicare PPO | Source: Ambulatory Visit | Attending: Nurse Practitioner | Admitting: Nurse Practitioner

## 2019-05-13 VITALS — BP 94/74 | HR 203 | Ht 67.0 in | Wt 196.0 lb

## 2019-05-13 DIAGNOSIS — M25552 Pain in left hip: Secondary | ICD-10-CM | POA: Diagnosis not present

## 2019-05-13 DIAGNOSIS — E039 Hypothyroidism, unspecified: Secondary | ICD-10-CM | POA: Diagnosis not present

## 2019-05-13 DIAGNOSIS — Z950 Presence of cardiac pacemaker: Secondary | ICD-10-CM | POA: Insufficient documentation

## 2019-05-13 DIAGNOSIS — I4891 Unspecified atrial fibrillation: Secondary | ICD-10-CM | POA: Insufficient documentation

## 2019-05-13 DIAGNOSIS — Z79899 Other long term (current) drug therapy: Secondary | ICD-10-CM | POA: Diagnosis not present

## 2019-05-13 DIAGNOSIS — Z7901 Long term (current) use of anticoagulants: Secondary | ICD-10-CM | POA: Insufficient documentation

## 2019-05-13 DIAGNOSIS — Z96643 Presence of artificial hip joint, bilateral: Secondary | ICD-10-CM | POA: Diagnosis not present

## 2019-05-13 DIAGNOSIS — I4892 Unspecified atrial flutter: Secondary | ICD-10-CM

## 2019-05-13 DIAGNOSIS — D6869 Other thrombophilia: Secondary | ICD-10-CM

## 2019-05-13 DIAGNOSIS — R002 Palpitations: Secondary | ICD-10-CM | POA: Diagnosis present

## 2019-05-13 LAB — CBC
HCT: 49.5 % — ABNORMAL HIGH (ref 36.0–46.0)
Hemoglobin: 15.6 g/dL — ABNORMAL HIGH (ref 12.0–15.0)
MCH: 29.7 pg (ref 26.0–34.0)
MCHC: 31.5 g/dL (ref 30.0–36.0)
MCV: 94.1 fL (ref 80.0–100.0)
Platelets: 337 10*3/uL (ref 150–400)
RBC: 5.26 MIL/uL — ABNORMAL HIGH (ref 3.87–5.11)
RDW: 13.8 % (ref 11.5–15.5)
WBC: 9.2 10*3/uL (ref 4.0–10.5)
nRBC: 0 % (ref 0.0–0.2)

## 2019-05-13 LAB — PHOSPHORUS: Phosphorus: 4 mg/dL (ref 2.5–4.6)

## 2019-05-13 LAB — BASIC METABOLIC PANEL
Anion gap: 16 — ABNORMAL HIGH (ref 5–15)
BUN: 24 mg/dL — ABNORMAL HIGH (ref 8–23)
CO2: 20 mmol/L — ABNORMAL LOW (ref 22–32)
Calcium: 10.1 mg/dL (ref 8.9–10.3)
Chloride: 101 mmol/L (ref 98–111)
Creatinine, Ser: 1.33 mg/dL — ABNORMAL HIGH (ref 0.44–1.00)
GFR calc Af Amer: 48 mL/min — ABNORMAL LOW (ref >60)
GFR calc non Af Amer: 41 mL/min — ABNORMAL LOW (ref >60)
Glucose, Bld: 114 mg/dL — ABNORMAL HIGH (ref 70–99)
Potassium: 4.4 mmol/L (ref 3.5–5.1)
Sodium: 137 mmol/L (ref 135–145)

## 2019-05-13 LAB — TROPONIN I (HIGH SENSITIVITY)
Troponin I (High Sensitivity): 8 ng/L (ref <18)
Troponin I (High Sensitivity): 7 ng/L (ref <18)

## 2019-05-13 LAB — MAGNESIUM: Magnesium: 2.3 mg/dL (ref 1.7–2.4)

## 2019-05-13 LAB — TSH: TSH: 1.011 u[IU]/mL (ref 0.350–4.500)

## 2019-05-13 MED ORDER — FENTANYL CITRATE (PF) 100 MCG/2ML IJ SOLN
INTRAMUSCULAR | Status: AC
  Filled 2019-05-13: qty 2

## 2019-05-13 MED ORDER — SODIUM CHLORIDE 0.9 % IV SOLN
INTRAVENOUS | Status: AC | PRN
  Administered 2019-05-13: 1000 mL via INTRAVENOUS

## 2019-05-13 MED ORDER — FENTANYL CITRATE (PF) 100 MCG/2ML IJ SOLN
INTRAMUSCULAR | Status: AC | PRN
  Administered 2019-05-13 (×3): 25 ug via INTRAVENOUS

## 2019-05-13 MED ORDER — MIDAZOLAM HCL 2 MG/2ML IJ SOLN
INTRAMUSCULAR | Status: AC | PRN
  Administered 2019-05-13: 1 mg via INTRAVENOUS
  Administered 2019-05-13: 2 mg via INTRAVENOUS
  Administered 2019-05-13: 1 mg via INTRAVENOUS
  Administered 2019-05-13: 2 mg via INTRAVENOUS

## 2019-05-13 MED ORDER — MIDAZOLAM HCL 2 MG/2ML IJ SOLN
INTRAMUSCULAR | Status: AC
  Filled 2019-05-13: qty 6

## 2019-05-13 NOTE — Sedation Documentation (Signed)
Shock delivered at [place] by Cristopher Peru MD

## 2019-05-13 NOTE — ED Notes (Signed)
Pt verbalized understanding of discharge instructions. Follow up care reviewed. Pt AOX4 after conscious sedation. Pt left ED with husband.

## 2019-05-13 NOTE — Consult Note (Signed)
Cardiology Consultation:   Patient ID: Kristin [name] MRN: [id number]; DOB: July 03, 1950  Admit date: 05/13/2019 Date of Consult: 05/13/2019  Primary Care Provider: Leamon Arnt, MD Primary Cardiologist: No primary care provider on file.  Primary Electrophysiologist: Allred   Patient Profile:   Kristin [name] is a 69 y.o. female with a hx of atrial fib and flutter who is being seen today for the evaluation of atrial flutter at the request of Dr. Johnney Killian.  History of Present Illness:   Ms. Wigren has a h/o atrial fib and has had 2 atrial fib ablations, the last 5 weeks ago. She is adamant that she has not missed her anti-coagulation. She was well until 3 days ago when she went out of rhythm. She was seen in the atrial fib clinic with 1:1 atrial flutter at 200/min. She was hypotensive and transferred for additional eval. She has not eaten since noon and then only minimal amounts of food. She has not had syncope but she feels poorly. No chest pain. Her most recent ablation reviewed and she had some limitation because of concerns over phrenic nerve damage.   Past Medical History:  Diagnosis Date  . AR (allergic rhinitis)   . Atrial fibrillation ([place])   . Clotting disorder ([place])   . Diverticulosis   . GERD (gastroesophageal reflux disease)   . Hiatal hernia   . Hiatal hernia   . Hypothyroidism   . Migraines   . Mild sleep apnea   . Osteoarthritis   . Osteopenia after menopause 06/14/2017   DEXA 08/2016 [facility]: T = -1.10 radius, -1.0 femur; lumbar spine elevated due to DJD; recheck 2-3 years.  . Pacemaker    CHB  . Paroxysmal A-fib ([place])   . PUD (peptic ulcer disease)   . Sleep apnea   . Thyroid nodule   . Transient complete heart block 1996    Past Surgical History:  Procedure Laterality Date  . ATRIAL FIBRILLATION ABLATION N/A 01/08/2019   Procedure: ATRIAL FIBRILLATION ABLATION;  Surgeon: [name], MD;  Location: [place] [facility] LAB;  Service: Cardiovascular;   Laterality: N/A;  . ATRIAL FIBRILLATION ABLATION N/A 04/02/2019   Procedure: ATRIAL FIBRILLATION ABLATION;  Surgeon: [name], MD;  Location: [place] [facility] LAB;  Service: Cardiovascular;  Laterality: N/A;  . CARDIOVERSION N/A 01/28/2019   Procedure: CARDIOVERSION;  Surgeon: Donato Heinz, MD;  Location: [facility] ENDOSCOPY;  Service: Endoscopy;  Laterality: N/A;  . CARDIOVERSION N/A 02/27/2019   Procedure: CARDIOVERSION;  Surgeon: Donato Heinz, MD;  Location: [facility] ENDOSCOPY;  Service: Endoscopy;  Laterality: N/A;  . HIP ARTHROPLASTY Right 2008  . PACEMAKER GENERATOR CHANGE  03/01/2007   performed by Dr Rosita [facility] at [facility])  . PACEMAKER INSERTION  09/08/1994   performed in [facility] for transient complete heart block  . THYROIDECTOMY, PARTIAL  1986   BENIGN NODULES  . TOTAL HIP ARTHROPLASTY Left 02/13/2018   Procedure: TOTAL HIP ARTHROPLASTY ANTERIOR APPROACH;  Surgeon: Paralee Cancel, MD;  Location: [facility];  Service: Orthopedics;  Laterality: Left;  70 mins  . TUBAL LIGATION  1987     Home Medications:  Prior to Admission medications   Medication Sig Start Date End Date Taking? Authorizing Provider  amoxicillin (AMOXIL) 500 MG capsule Take 2,000 mg by mouth See admin instructions. Take 4 capsules (2000 mg) by mouth 1 hour before dental visits 07/18/17  Yes [provider]  CALCIUM PO Take 400 mg by mouth daily.   Yes [provider]  cholecalciferol (VITAMIN D) 25 MCG (1000 UNIT) tablet Take 1,000 Units by mouth daily.    Yes [provider]  Cyanocobalamin (B-12) 1000 MCG SUBL Place 1 mL under the tongue once a week.    Yes [provider]  diclofenac sodium (VOLTAREN) 1 % GEL Apply 2 g topically 4 (four) times daily as needed. Patient taking differently: Apply 2 g topically 4 (four) times daily as needed (pain.).  10/08/18  Yes Leamon Arnt, MD  diltiazem (CARDIZEM CD) 120 MG 24 hr capsule Take 1 capsule (120 mg total) by  mouth 2 (two) times daily. May take extra 1 tablet daily for breakthrough afib. 03/19/19  Yes Sherran Needs, NP  diltiazem (CARDIZEM) 30 MG tablet Take 1 tablet every 4 hours AS NEEDED for AFIB heart rate >100 Patient taking differently: No sig reported 05/18/18  Yes Fenton, Clint R, PA  ELIQUIS 5 MG TABS tablet TAKE 1 TABLET BY MOUTH TWICE DAILY. Patient taking differently: Take 5 mg by mouth 2 (two) times daily.  12/26/18  Yes Allred, Jeneen Rinks, MD  flecainide (TAMBOCOR) 50 MG tablet TAKE 1 TABLET BY MOUTH TWICE DAILY. Patient taking differently: Take 50 mg by mouth 2 (two) times daily.  04/18/19  Yes Sherran Needs, NP  levothyroxine (SYNTHROID) 125 MCG tablet TAKE 1 TABLET BY MOUTH DAILY. Patient taking differently: Take 125 mcg by mouth daily before breakfast.  03/21/19  Yes Leamon Arnt, MD  Magnesium 250 MG TABS Take 250 mg by mouth every evening.    Yes [provider]  omeprazole (PRILOSEC) 20 MG capsule TAKE (1) CAPSULE DAILY. Patient taking differently: Take 20 mg by mouth daily before breakfast.  09/20/18  Yes Leamon Arnt, MD  Potassium 99 MG TABS Take 99 mg by mouth daily.    Yes [provider]  sodium chloride (OCEAN) 0.65 % SOLN nasal spray Place 1 spray into both nostrils 2 (two) times daily.    Yes [provider]  traMADol (ULTRAM) 50 MG tablet Take 1 tablet (50 mg total) by mouth every 6 (six) hours as needed for moderate pain. 10/08/18  Yes Leamon Arnt, MD    Inpatient Medications: Scheduled Meds:  Continuous Infusions:  PRN Meds:   Allergies:    Allergies  Allergen Reactions  . Morphine And Related Hives and Itching  . Neomycin Other (See Comments)    itching   . Adhesive [Tape] Rash    Blistering rash     Social History:   Social History   Socioeconomic History  . Marital status: Married    Spouse name: Not on file  . Number of children: 0  . Years of education: Not on file  . Highest education level: Not on file    Occupational History  . Occupation: retired  Tobacco Use  . Smoking status: Never Smoker  . Smokeless tobacco: Never Used  Substance and Sexual Activity  . Alcohol use: Not Currently    Alcohol/week: 1.0 standard drinks    Types: 1 Glasses of wine per week    Comment: occasionally  . Drug use: No  . Sexual activity: Not Currently  Other Topics Concern  . 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 Difficulty of Paying Living Expenses:   Food Insecurity:   . Worried About Charity fundraiser  in the Last Year:   . Advance in the Last Year:   Transportation Needs:   . Film/video editor (Medical):   Marland Kitchen Lack of Transportation (Non-Medical):   Physical Activity:   . Days of Exercise per Week:   . Minutes of Exercise per Session:   Stress:   . Feeling of Stress :   Social Connections:   . Frequency of Communication with Friends and Family:   . Frequency of Social Gatherings with Friends and Family:   . Attends Religious Services:   . Active Member of Clubs or Organizations:   . Attends Archivist Meetings:   Marland Kitchen Marital Status:   Intimate Partner Violence:   . Fear of Current or Ex-Partner:   . Emotionally Abused:   Marland Kitchen Physically Abused:   . Sexually Abused:     Family History:    Family History  Problem Relation Age of Onset  . Diabetes Mellitus II Maternal Uncle   . Cancer Maternal Grandmother        ENDOMETRIAL  . Rheum arthritis Mother   . Peptic Ulcer Mother   . COPD Brother   . Drug abuse Brother   . Breast cancer Neg Hx      ROS:  Please see the history of present illness.   All other ROS reviewed and negative.     Physical Exam/Data:   Vitals:   05/13/19 1700 05/13/19 1715 05/13/19 1730 05/13/19 1745  BP: 127/88 111/82 100/75 96/81  Pulse: (!) 105 (!) 107 (!) 109 (!) 110  Resp: 20 15 15 10    Temp:      TempSrc:      SpO2: 98% 97% 97% 98%  Weight:      Height:       No intake or output data in the 24 hours ending 05/13/19 1806 Last 3 Weights 05/13/2019 05/13/2019 04/30/2019  Weight (lbs) 195 lb 15.8 oz 196 lb 199 lb 6.4 oz  Weight (kg) 88.9 kg 88.905 kg 90.447 kg     Body mass index is 30.7 kg/m.  General:  Well nourished, well developed, in no acute distress HEENT: normal Lymph: no adenopathy Neck: no JVD Endocrine:  No thryomegaly Vascular: No carotid bruits; FA pulses 2+ bilaterally without bruits  Cardiac:  Regular tachy Lungs:  clear to auscultation bilaterally, no wheezing, rhonchi or rales  Abd: soft, nontender, no hepatomegaly  Ext: no edema Musculoskeletal:  No deformities, BUE and BLE strength normal and equal Skin: warm and dry  Neuro:  CNs 2-12 intact, no focal abnormalities noted Psych:  Normal affect   EKG:  The EKG was personally reviewed and demonstrates:  Typical atrial flutter Telemetry:  Telemetry was personally reviewed and demonstrates:  Atrial flutter  Relevant CV Studies: none  Laboratory Data:  High Sensitivity Troponin:   Recent Labs  Lab 05/13/19 1443 05/13/19 1643  TROPONINIHS 8 7     Chemistry Recent Labs  Lab 05/13/19 1443  NA 137  K 4.4  CL 101  CO2 20*  GLUCOSE 114*  BUN 24*  CREATININE 1.33*  CALCIUM 10.1  GFRNONAA 41*  GFRAA 48*  ANIONGAP 16*    No results for input(s): PROT, ALBUMIN, AST, ALT, ALKPHOS, BILITOT in the last 168 hours. Hematology Recent Labs  Lab 05/13/19 1443  WBC 9.2  RBC 5.26*  HGB 15.6*  HCT 49.5*  MCV 94.1  MCH 29.7  MCHC 31.5  RDW 13.8  PLT 337   BNPNo results for input(s): BNP,  PROBNP in the last 168 hours.  DDimer No results for input(s): DDIMER in the last 168 hours.   Radiology/Studies:  DG Chest Port 1 View  Result Date: 05/13/2019 CLINICAL DATA:  69 year old female with shortness of breath. EXAM: PORTABLE CHEST 1 VIEW COMPARISON:  Chest radiograph dated 07/26/2006.  FINDINGS: There is no focal consolidation, pleural effusion, pneumothorax. There is mild cardiomegaly. Left pectoral pacemaker device. Probable left hilar calcified granuloma. No acute osseous pathology. IMPRESSION: 1. No acute cardiopulmonary process. 2. Mild cardiomegaly. Electronically Signed   By: Anner Crete M.D.   On: 05/13/2019 15:23     Assessment and Plan:   1. Atrial flutter - she is quite symptomatic and has not missed any of her Home. She would like to be cardioverted. I have reviewed the findings with the patient and her husband, Dr. Lanny Cramp and we will proceed with DCCV.   2. Disp. - if she remains in NSR, then she will be able to be discharged home in the next 2 hours. No driving for 24 hours.  For questions or updates, please contact [place] Please consult [email or web address] for contact info under   Signed, Cristopher Peru, MD  05/13/2019 6:06 PM

## 2019-05-13 NOTE — CV Procedure (Signed)
EP Procedure Note  Procedure performed: DCCV  Preoperative diagnosis: symptomatic atrial flutter with a RVR  Postoperative diagnosis: same as preoperative diagnosis  Description of the procedure: after informed consent was obtained,the patient was sedated under my direct supervision with 6 mg of IV versed and 75 mcg of IV fentanyl. She was cardioverted with 120 joules of synchronized biphasic energy, restoring NSR. She was observed and had no recurrent atrial fib.  Complications: none immediately  Conclusion: successful DCCV in a patient with symptomatic atrial flutter after Atrial fib ablation.  Mikle Bosworth.D.

## 2019-05-13 NOTE — ED Notes (Signed)
Pt ambulated to and from bathroom w/ assistance from RN. Upon returning, pt's HR was 190, gradually decreased to 111. Pt endorsed lightheadedness and SOB.

## 2019-05-13 NOTE — ED Triage Notes (Signed)
Pt here from afib clinic for [place] in 200s and BP 90/70. Pt has hx of afib and had second ablation 6 weeks ago, went into afib on Friday. Pt takes eliquis, has hx of cardioversion in December. AOX4, HR currently 105, BP 124/83.

## 2019-05-13 NOTE — ED Provider Notes (Signed)
[facility] EMERGENCY DEPARTMENT Provider Note   CSN: [id number] Arrival date & time: 05/13/19  1416     History Chief Complaint  Patient presents with  . afib    Kristin [name] is a 69 y.o. female with a history of atrial fibrillation on Eliquis status post ablation x2 most recently 5- 6 weeks prior, hypothyroidism, mild sleep apnea, and complete heart block who presents to the emergency department from the atrial fibrillation clinic today with complaints of palpitations x 4 days.  Patient states that she has had palpitations as if her heart is racing with associated shortness of breath and lightheadedness which is very mild when she is at rest but becomes quite prominent when she is up moving around.  No other alleviating or aggravating factors.  This feels similar to prior atrial fibrillation.  She has been compliant with her Eliquis, has not missed any doses.  She went to the A. fib clinic due to her symptoms, they stated she was in rapid atrial flutter and her blood pressure was soft therefore she was sent to the emergency department for possible cardioversion.  Patient states she is feeling better sitting/resting in the stretcher.  She denies chest pain, syncope, leg pain, leg swelling, vomiting, or diarrhea.  HPI     Past Medical History:  Diagnosis Date  . AR (allergic rhinitis)   . Atrial fibrillation ([place])   . Clotting disorder ([place])   . Diverticulosis   . GERD (gastroesophageal reflux disease)   . Hiatal hernia   . Hiatal hernia   . Hypothyroidism   . Migraines   . Mild sleep apnea   . Osteoarthritis   . Osteopenia after menopause 06/14/2017   DEXA 08/2016 [facility]: T = -1.10 radius, -1.0 femur; lumbar spine elevated due to DJD; recheck 2-3 years.  . Pacemaker    CHB  . Paroxysmal A-fib ([place])   . PUD (peptic ulcer disease)   . Sleep apnea   . Thyroid nodule   . Transient complete heart block 1996    Patient Active Problem List   Diagnosis Date  Noted  . Atrial flutter ([place])   . Overweight (BMI 25.0-29.9) 02/14/2018  . S/P left THA, AA 02/13/2018  . OSA (obstructive sleep apnea) 10/20/2017    Class: Chronic  . Osteopenia after menopause 06/14/2017  . Osteoarthritis of left hip 05/11/2017  . Diverticulosis of large intestine without hemorrhage 01/04/2016  . Internal hemorrhoids without complication [id number]  . Complete heart block ([place]) 10/06/2015  . Long term current use of anticoagulant 08/05/2015  . Pacemaker 10/14/2014  . Paroxysmal atrial fibrillation ([place]) 10/14/2014  . AR (allergic rhinitis) 01/08/2013  . GERD (gastroesophageal reflux disease) 01/08/2013  . Hypothyroidism, postsurgical 01/08/2013  . OA (osteoarthritis) 01/08/2013  . History of peptic ulcer disease - nsaid 01/08/2013    Past Surgical History:  Procedure Laterality Date  . ATRIAL FIBRILLATION ABLATION N/A 01/08/2019   Procedure: ATRIAL FIBRILLATION ABLATION;  Surgeon: [name], MD;  Location: [facility];  Service: Cardiovascular;  Laterality: N/A;  . ATRIAL FIBRILLATION ABLATION N/A 04/02/2019   Procedure: ATRIAL FIBRILLATION ABLATION;  Surgeon: [name], MD;  Location: [facility];  Service: Cardiovascular;  Laterality: N/A;  . CARDIOVERSION N/A 01/28/2019   Procedure: CARDIOVERSION;  Surgeon: Donato Heinz, MD;  Location: [facility] ENDOSCOPY;  Service: Endoscopy;  Laterality: N/A;  . CARDIOVERSION N/A 02/27/2019   Procedure: CARDIOVERSION;  Surgeon: Donato Heinz, MD;  Location: [facility] ENDOSCOPY;  Service: Endoscopy;  Laterality: N/A;  . HIP ARTHROPLASTY Right 2008  . PACEMAKER GENERATOR CHANGE  03/01/2007   performed by Dr Rosita Fire at [facility])  . PACEMAKER INSERTION  09/08/1994   performed in [facility] for transient complete heart block  . THYROIDECTOMY, PARTIAL  1986   BENIGN NODULES  . TOTAL HIP ARTHROPLASTY Left 02/13/2018   Procedure: TOTAL HIP ARTHROPLASTY ANTERIOR APPROACH;  Surgeon: Paralee Cancel, MD;  Location: [facility] ORS;  Service: Orthopedics;  Laterality: Left;  70 mins  . TUBAL LIGATION  1987     OB History   No obstetric history on file.     Family History  Problem Relation Age of Onset  . Diabetes Mellitus II Maternal Uncle   . Cancer Maternal Grandmother        ENDOMETRIAL  . Rheum arthritis Mother   . Peptic Ulcer Mother   . COPD Brother   . Drug abuse Brother   . Breast cancer Neg Hx     Social History   Tobacco Use  . Smoking status: Never Smoker  . Smokeless tobacco: Never Used  Substance Use Topics  . Alcohol use: Not Currently    Alcohol/week: 1.0 standard drinks    Types: 1 Glasses of wine per week    Comment: occasionally  . Drug use: No    Home Medications Prior to Admission medications   Medication Sig Start Date End Date Taking? Authorizing Provider  amoxicillin (AMOXIL) 500 MG capsule Take 2,000 mg by mouth See admin instructions. Take 4 capsules (2000 mg) by mouth 1 hour before dental visits 07/18/17   [provider]  CALCIUM PO Take 400 mg by mouth daily.    [provider]  cholecalciferol (VITAMIN D) 25 MCG (1000 UNIT) tablet Take 1,000 Units by mouth daily.     [provider]  Cyanocobalamin (B-12) 1000 MCG SUBL Place 1 mL under the tongue once a week.     [provider]  diclofenac sodium (VOLTAREN) 1 % GEL Apply 2 g topically 4 (four) times daily as needed. Patient taking differently: Apply 2 g topically 4 (four) times daily as needed (pain.).  10/08/18   Leamon Arnt, MD  diltiazem (CARDIZEM CD) 120 MG 24 hr capsule Take 1 capsule (120 mg total) by mouth 2 (two) times daily. May take extra 1 tablet daily for breakthrough afib. 03/19/19   Sherran Needs, NP  diltiazem (CARDIZEM) 30 MG tablet Take 1 tablet every 4 hours AS NEEDED for AFIB heart rate >100 Patient taking differently: No sig reported 05/18/18   Fenton, Clint R, PA  ELIQUIS 5 MG TABS tablet TAKE 1 TABLET BY MOUTH TWICE DAILY.  12/26/18   Allred, Jeneen Rinks, MD  flecainide (TAMBOCOR) 50 MG tablet TAKE 1 TABLET BY MOUTH TWICE DAILY. 04/18/19   Sherran Needs, NP  levothyroxine (SYNTHROID) 125 MCG tablet TAKE 1 TABLET BY MOUTH DAILY. Patient taking differently: Take 125 mcg by mouth daily before breakfast.  03/21/19   Leamon Arnt, MD  Magnesium 250 MG TABS Take 250 mg by mouth every evening.     [provider]  omeprazole (PRILOSEC) 20 MG capsule TAKE (1) CAPSULE DAILY. Patient taking differently: Take 20 mg by mouth daily before breakfast.  09/20/18   Leamon Arnt, MD  Potassium 99 MG TABS Take 99 mg by mouth daily.     [provider]  sodium chloride (OCEAN) 0.65 % SOLN nasal spray Place 1 spray into both nostrils 2 (two) times  daily.     [provider]  traMADol (ULTRAM) 50 MG tablet Take 1 tablet (50 mg total) by mouth every 6 (six) hours as needed for moderate pain. 10/08/18   Leamon Arnt, MD    Allergies    Morphine and related, Neomycin, and Adhesive [tape]  Review of Systems   Review of Systems  Constitutional: Negative for chills and fever.  Respiratory: Positive for shortness of breath. Negative for cough.   Cardiovascular: Positive for palpitations. Negative for chest pain and leg swelling.  Gastrointestinal: Negative for abdominal pain and vomiting.  Genitourinary: Negative for dysuria.  Neurological: Positive for light-headedness. Negative for syncope.  All other systems reviewed and are negative.   Physical Exam Updated Vital Signs BP 117/81 (BP Location: Left Arm)   Pulse (!) 103   Temp 98.7 F (37.1 C) (Oral)   Resp 18   SpO2 97%   Physical Exam Vitals and nursing note reviewed.  Constitutional:      General: She is not in acute distress.    Appearance: She is well-developed. She is not toxic-appearing.  HENT:     Head: Normocephalic and atraumatic.  Eyes:     General:        Right eye: No discharge.        Left eye: No discharge.      Conjunctiva/sclera: Conjunctivae normal.  Cardiovascular:     Rate and Rhythm: Tachycardia present.     Comments: 2+ symmetric radial pulses. Pulmonary:     Effort: Pulmonary effort is normal. No respiratory distress.     Breath sounds: Normal breath sounds. No wheezing, rhonchi or rales.  Abdominal:     General: There is no distension.     Palpations: Abdomen is soft.     Tenderness: There is no abdominal tenderness.  Musculoskeletal:     Cervical back: Neck supple.     Right lower leg: No edema.     Left lower leg: No edema.  Skin:    General: Skin is warm and dry.     Findings: No rash.  Neurological:     Mental Status: She is alert.     Comments: Clear speech.   Psychiatric:        Behavior: Behavior normal.    ED Results / Procedures / Treatments   Labs (all labs ordered are listed, but only abnormal results are displayed) Labs Reviewed  CBC - Abnormal; Notable for the following components:      Result Value   RBC 5.26 (*)    Hemoglobin 15.6 (*)    HCT 49.5 (*)    All other components within normal limits  BASIC METABOLIC PANEL  MAGNESIUM  TSH  PHOSPHORUS  TROPONIN I (HIGH SENSITIVITY)    EKG None  Radiology No results found.  Procedures Procedures (including critical care time)  Medications Ordered in ED Medications - No data to display  ED Course  I have reviewed the triage vital signs and the nursing notes.  Pertinent labs & imaging results that were available during my care of the patient were reviewed by me and considered in my medical decision making (see chart for details).  KAYAH CHINO was evaluated in Emergency Department on 05/13/2019 for the symptoms described in the history of present illness. He/she was evaluated in the context of the global COVID-19 pandemic, which necessitated consideration that the patient might be at risk for infection with the SARS-CoV-2 virus that causes COVID-19. Institutional protocols and algorithms that pertain  to the evaluation of patients at risk for COVID-19 are in a state of rapid change based on information released by regulatory bodies including the CDC and federal and state organizations. These policies and algorithms were followed during the patient's care in the ED.    MDM Rules/Calculators/A&P                      Patient presents to the emergency department from [facility] clinic for evaluation of palpitations/dyspnea for the past 4 days.  Found to be in rapid atrial flutter with soft pressure in clinic therefore she was sent to the emergency department.  On arrival she is nontoxic, resting comfortably, vitals WNL with the exception of her tachycardia.  EKG and cardiac monitor with findings of possible atrial flutter- rate significantly improved.  She otherwise has benign physical exam.  Plan for labs, CXR, and cardiology consultation.  15:15: Patient care signed out to oncoming ED team Dr. Zenia Resides & Dr. Dallie Piles pending work-up, cardiology consult & disposition.   Final Clinical Impression(s) / ED Diagnoses Final diagnoses:  None    Rx / DC Orders ED Discharge Orders    None       Amaryllis Dyke, PA-C 05/13/19 1515    Charlesetta Shanks, MD 05/13/19 1528

## 2019-05-13 NOTE — Progress Notes (Signed)
Pt in for appointment for her c/o that she went out of rhythm on Friday. She  had her second ablation around 5 weeks ago at which time she had an atrial flutter ablation. 6-8 weeks prior to that, she had an afib ablation by Dr. Rayann Heman. Her EKG shows atrial flutter at 203 bpm with a BP of 94/60. To ER to be considered for urgent cardioversion. Dr. Rayann Heman notified.

## 2019-05-13 NOTE — ED Provider Notes (Signed)
Care of the patient was assumed from [facility] PA-C at 1500; see this physician's note for complete history of present illness, review of systems, and physical exam.  Briefly, the patient is a 69 y.o. female who presented to the ED with palpitations.  History significant for A fib on eliquis, s/p ablation x2 most recently 6 weeks ago, hypothyroidism, complete heart block s/p pacemaker placement. Patient was initially sent from A. Fib clinic today for consideration of cardioversion. Patient was in A Fib w/ RVR with associated hypoTN in clinic. No longer unstable upon arrival to the ED. Her sxs are worse with exertion. Sxs began 3 days ago.  Plan at time of handoff:  -Follow up labs -Follow up imaging -Discuss with cardiology   MDM/ED Course: On my initial exam, the patient was mildly tachycardic in the low 100s, stable.    Labs demonstrated no significant leukocytosis, hemoglobin 15.6 with MCV of 94.1, platelets 337, mildly diminished bicarb to 20, BUN elevated 24 with a creatinine of 1.33 which appears significantly elevated compared to previous of 0.93, AG 16, magnesium 2.3, phosphorus 4.0, initial troponin 8, delta troponin VII  CXR interpreted by me demonstrated mild cardiomegaly otherwise no acute cardiopulmonary processes  Cardiology consulted and subsequently cardioverted the patient in the emergency department.  They recommend observation for 2 hours status post cardioversion.  Upon reassessment 2 hours following cardioversion at 2046, patient remains in sinus rhythm with rates in the upper 60s to mid 70s.  Patient symptoms are significant improved.  I feel that the patient is likely safe and stable for discharge at this time with strict return precautions and plan for follow-up care in place.  Patient seen in conjunction with the attending physician, Vivi Martens, MD, who participated in all aspects of the patient's care and was in agreement with the above plan.   Emergency Department  Medication Summary: Medications  0.9 %  sodium chloride infusion (1,000 mLs Intravenous New Bag/Given 05/13/19 1826)  midazolam (VERSED) injection (1 mg Intravenous Given 05/13/19 1840)  fentaNYL (SUBLIMAZE) injection (25 mcg Intravenous Given 05/13/19 1840)   Clinical Impression: 1. Atrial fibrillation, unspecified type [facility])        Filbert Berthold, MD 05/13/19 [id number]    Charlesetta Shanks, MD 05/20/19 2352

## 2019-05-13 NOTE — ED Notes (Signed)
Pt received 6mg  total of versed, and 75 mcg of fentanyl total for cardioversion. Shock delivered at [place]. Pt responsive to voice, denies pain.

## 2019-05-20 ENCOUNTER — Encounter (HOSPITAL_COMMUNITY): Payer: Self-pay | Admitting: Nurse Practitioner

## 2019-05-20 ENCOUNTER — Other Ambulatory Visit: Payer: Self-pay

## 2019-05-20 ENCOUNTER — Ambulatory Visit (HOSPITAL_COMMUNITY)
Admission: RE | Admit: 2019-05-20 | Discharge: 2019-05-20 | Disposition: A | Discharge status: Home / Self Care | Payer: Medicare PPO | Source: Ambulatory Visit | Attending: Nurse Practitioner | Admitting: Nurse Practitioner

## 2019-05-20 ENCOUNTER — Telehealth: Payer: Self-pay | Admitting: Pharmacist

## 2019-05-20 VITALS — BP 120/72 | HR 75 | Ht 67.0 in | Wt 199.8 lb

## 2019-05-20 DIAGNOSIS — Z79899 Other long term (current) drug therapy: Secondary | ICD-10-CM | POA: Diagnosis not present

## 2019-05-20 DIAGNOSIS — K219 Gastro-esophageal reflux disease without esophagitis: Secondary | ICD-10-CM | POA: Insufficient documentation

## 2019-05-20 DIAGNOSIS — Z825 Family history of asthma and other chronic lower respiratory diseases: Secondary | ICD-10-CM | POA: Diagnosis not present

## 2019-05-20 DIAGNOSIS — G4730 Sleep apnea, unspecified: Secondary | ICD-10-CM | POA: Diagnosis not present

## 2019-05-20 DIAGNOSIS — Z7901 Long term (current) use of anticoagulants: Secondary | ICD-10-CM | POA: Insufficient documentation

## 2019-05-20 DIAGNOSIS — I442 Atrioventricular block, complete: Secondary | ICD-10-CM | POA: Diagnosis not present

## 2019-05-20 DIAGNOSIS — M1990 Unspecified osteoarthritis, unspecified site: Secondary | ICD-10-CM | POA: Insufficient documentation

## 2019-05-20 DIAGNOSIS — E890 Postprocedural hypothyroidism: Secondary | ICD-10-CM | POA: Insufficient documentation

## 2019-05-20 DIAGNOSIS — I4892 Unspecified atrial flutter: Secondary | ICD-10-CM

## 2019-05-20 DIAGNOSIS — Z8711 Personal history of peptic ulcer disease: Secondary | ICD-10-CM | POA: Diagnosis not present

## 2019-05-20 DIAGNOSIS — Z950 Presence of cardiac pacemaker: Secondary | ICD-10-CM | POA: Insufficient documentation

## 2019-05-20 DIAGNOSIS — I480 Paroxysmal atrial fibrillation: Secondary | ICD-10-CM | POA: Insufficient documentation

## 2019-05-20 DIAGNOSIS — Z79890 Hormone replacement therapy (postmenopausal): Secondary | ICD-10-CM | POA: Insufficient documentation

## 2019-05-20 DIAGNOSIS — Z96643 Presence of artificial hip joint, bilateral: Secondary | ICD-10-CM | POA: Diagnosis not present

## 2019-05-20 DIAGNOSIS — D6869 Other thrombophilia: Secondary | ICD-10-CM | POA: Diagnosis not present

## 2019-05-20 DIAGNOSIS — Z885 Allergy status to narcotic agent status: Secondary | ICD-10-CM | POA: Insufficient documentation

## 2019-05-20 DIAGNOSIS — Z8261 Family history of arthritis: Secondary | ICD-10-CM | POA: Insufficient documentation

## 2019-05-20 NOTE — Patient Instructions (Addendum)
STOP flecainide after Thursday PM

## 2019-05-20 NOTE — Progress Notes (Signed)
Primary Care Physician: Leamon Arnt, MD Referring Physician: Dr. Chancy Hurter is a 69 y.o. female with a h/o paroxysmal afib that is in the afib clinic at pt's request as she went out of rhythm yesterday am. She  had an ablation 12/15. She  denies any shortness of breath, no extra  fluid. No triggers that she is aware of. She has taken extra 30 mg Cardizem yesterday and an extra 120 mg Cardizem this am but she persists in atrial flutter. This does not feel as uncomfortable to her that the afib did prior to ablation. She  continues on eliquis 5 mg bid for a CHA2DS2VASc score of at least 2.  She continues on her flecainide 50 mg bid. No swallowing or groin issues. EKG shows atrial flutter at 118 bpm.   Returns to clinic 12/31. 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 Marland Kitchen   Has not missed any anticoagulation x for at least 3 weeks.   F/u in afib clinic, 02/05/19. She had successful cardioversion and is staying in SR. She feels improved.   F/u in afib clinic, 2/1. She  went out of rhythm last Friday. Ablation 01/08/19. Extra Cardizem did not convert her. She  feels presyncopal with walking. Her v rate is 108 bpm today with a BP of 112/70. Last DCCV lasted almost one month. She  does not want to go to the ER for urgent cardioversion and  at this point does not  want to change antiarrythmic's. No  missed anticoagulation for CHA2DS2VASc score of 2.  F/u in afib clinic, 03/06/19. She had successful cardioversion 02/27/19. She is in SR today.   F/u in afib clinic 04/30/19. She is now one month out from  her second ablation. She had a typical atrial flutter ablation and some atypical flutter  circuits were found and ablated as well although there were a few atypical flutter circuits that could not be treated as too close to phrenic nerve. She has enjoyed an arrhythmia free period  since 2nd ablation. She is feeling improved.  F/u in afib clinic, 4/26 for a ER visit last week for a 1:1 atrial flutter . She  was cardioverted in the  ER by Dr. Lovena Le. I discussed with Dr. Rayann Heman and he feels that a change in antiarrythmic is needed to tikosyn. Discussed with pt today and she is agreeable to this. No further arrhythmia since cardioversion.   Today, she denies symptoms of palpitations, chest pain, shortness of breath, orthopnea, PND, lower extremity edema, dizziness, presyncope, syncope, or neurologic sequela. The patient is tolerating medications without difficulties and is otherwise without complaint today.   Past Medical History:  Diagnosis Date  . AR (allergic rhinitis)   . Atrial fibrillation ([place])   . Clotting disorder ([place])   . Diverticulosis   . GERD (gastroesophageal reflux disease)   . Hiatal hernia   . Hiatal hernia   . Hypothyroidism   . Migraines   . Mild sleep apnea   . Osteoarthritis   . Osteopenia after menopause 06/14/2017   DEXA 08/2016 [facility]: T = -1.10 radius, -1.0 femur; lumbar spine elevated due to DJD; recheck 2-3 years.  . Pacemaker    CHB  . Paroxysmal A-fib ([place])   . PUD (peptic ulcer disease)   . Sleep apnea   . Thyroid  nodule   . Transient complete heart block 1996   Past Surgical History:  Procedure Laterality Date  . ATRIAL FIBRILLATION ABLATION N/A 01/08/2019   Procedure: ATRIAL FIBRILLATION ABLATION;  Surgeon: [name], MD;  Location: [place] [facility];  Service: Cardiovascular;  Laterality: N/A;  . ATRIAL FIBRILLATION ABLATION N/A 04/02/2019   Procedure: ATRIAL FIBRILLATION ABLATION;  Surgeon: [name], MD;  Location: [place] [facility];  Service: Cardiovascular;  Laterality: N/A;  . CARDIOVERSION N/A 01/28/2019   Procedure: CARDIOVERSION;  Surgeon: Donato Heinz, MD;  Location: [facility] ENDOSCOPY;  Service: Endoscopy;  Laterality: N/A;  . CARDIOVERSION N/A 02/27/2019   Procedure: CARDIOVERSION;  Surgeon: Donato Heinz, MD;  Location: [facility] ENDOSCOPY;  Service: Endoscopy;  Laterality: N/A;  . HIP ARTHROPLASTY Right 2008  . PACEMAKER GENERATOR CHANGE  03/01/2007   performed by Dr Rosita Fire at [facility])  . PACEMAKER INSERTION  09/08/1994   performed in [facility] for transient complete heart block  . THYROIDECTOMY, PARTIAL  1986   BENIGN NODULES  . TOTAL HIP ARTHROPLASTY Left 02/13/2018   Procedure: TOTAL HIP ARTHROPLASTY ANTERIOR APPROACH;  Surgeon: Paralee Cancel, MD;  Location: [facility] ORS;  Service: Orthopedics;  Laterality: Left;  70 mins  . TUBAL LIGATION  1987    Current Outpatient Medications  Medication Sig Dispense Refill  . amoxicillin (AMOXIL) 500 MG capsule Take 2,000 mg by mouth See admin instructions. Take 4 capsules (2000 mg) by mouth 1 hour before dental visits    . CALCIUM PO Take 400 mg by mouth daily.    . cholecalciferol (VITAMIN D) 25 MCG (1000 UNIT) tablet Take 1,000 Units by mouth daily.     . Cyanocobalamin (B-12) 1000 MCG SUBL Place 1 mL under the tongue once a week.     . diclofenac sodium (VOLTAREN) 1 % GEL Apply 2 g topically 4 (four) times daily as needed. (Patient taking differently: Apply 2 g topically 4 (four) times daily as needed (pain.). ) 100 g 5  . diltiazem (CARDIZEM CD) 120 MG 24 hr capsule Take 1 capsule (120 mg total) by mouth 2 (two) times daily. May take extra 1 tablet daily for breakthrough afib. 190 capsule 2  . diltiazem (CARDIZEM) 30 MG tablet Take 1 tablet every 4 hours AS NEEDED for AFIB heart rate >100 (Patient taking differently: No sig reported) 45 tablet 3  . ELIQUIS 5 MG TABS tablet TAKE 1 TABLET BY MOUTH TWICE DAILY. (Patient taking differently: Take 5 mg by mouth 2 (two) times daily. ) 180 tablet 1  . flecainide (TAMBOCOR) 50 MG tablet TAKE 1 TABLET BY MOUTH TWICE DAILY. (Patient taking differently: Take 50 mg by mouth 2 (two) times daily. ) 60 tablet 3  . levothyroxine (SYNTHROID) 125 MCG tablet TAKE 1 TABLET BY MOUTH DAILY.  (Patient taking differently: Take 125 mcg by mouth daily before breakfast. ) 90 tablet 0  . Magnesium 250 MG TABS Take 250 mg by mouth every evening.     Marland Kitchen omeprazole (PRILOSEC) 20 MG capsule TAKE (1) CAPSULE DAILY. (Patient taking differently: Take 20 mg by mouth daily before breakfast. ) 90 capsule 3  . Potassium 99 MG TABS Take 99 mg by mouth daily.     . sodium chloride (OCEAN) 0.65 % SOLN nasal spray Place 1 spray into both nostrils 2 (two) times daily.     . traMADol (ULTRAM) 50 MG tablet Take 1 tablet (50 mg total) by mouth every 6 (six) hours as needed for moderate pain.  30 tablet 0   No current facility-administered medications for this encounter.    Allergies  Allergen Reactions  . Morphine And Related Hives and Itching  . Neomycin Other (See Comments)    itching   . Adhesive [Tape] Rash    Blistering rash     Social History   Socioeconomic History  . Marital status: Married    Spouse name: Not on file  . Number of children: 0  . Years of education: Not on file  . Highest education level: Not on file  Occupational History  . Occupation: retired  Tobacco Use  . Smoking status: Never Smoker  . Smokeless tobacco: Never Used  Substance and Sexual Activity  . Alcohol use: Not Currently    Alcohol/week: 1.0 standard drinks    Types: 1 Glasses of wine per week    Comment: occasionally  . Drug use: No  . Sexual activity: Not Currently  Other Topics Concern  . 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 Difficulty of Paying Living Expenses:   Food Insecurity:   . Worried About Charity fundraiser in the Last Year:   . Arboriculturist in the Last Year:   Transportation Needs:   . Film/video editor (Medical):   Marland Kitchen Lack of Transportation (Non-Medical):   Physical Activity:   . Days of Exercise per Week:   . Minutes of Exercise  per Session:   Stress:   . Feeling of Stress :   Social Connections:   . Frequency of Communication with Friends and Family:   . Frequency of Social Gatherings with Friends and Family:   . Attends Religious Services:   . Active Member of Clubs or Organizations:   . Attends Archivist Meetings:   Marland Kitchen Marital Status:   Intimate Partner Violence:   . Fear of Current or Ex-Partner:   . Emotionally Abused:   Marland Kitchen Physically Abused:   . Sexually Abused:     Family History  Problem Relation Age of Onset  . Diabetes Mellitus II Maternal Uncle   . Cancer Maternal Grandmother        ENDOMETRIAL  . Rheum arthritis Mother   . Peptic Ulcer Mother   . COPD Brother   . Drug abuse Brother   . Breast cancer Neg Hx     ROS- All systems are reviewed and negative except as per the HPI above  Physical Exam: There were no vitals filed for this visit. Wt Readings from Last 3 Encounters:  05/13/19 88.9 kg  05/13/19 88.9 kg  04/30/19 90.4 kg    Labs: Lab Results  Component Value Date   NA 137 05/13/2019   K 4.4 05/13/2019   CL 101 05/13/2019   CO2 20 (L) 05/13/2019   GLUCOSE 114 (H) 05/13/2019   BUN 24 (H) 05/13/2019   CREATININE 1.33 (H) 05/13/2019   CALCIUM 10.1 05/13/2019   PHOS 4.0 05/13/2019   MG 2.3 05/13/2019   Lab Results  Component Value Date   INR 0.87 02/13/2018   Lab Results  Component Value Date   CHOL 187 10/08/2018   HDL 73.50 10/08/2018   [place] 95 10/08/2018   TRIG 93.0 10/08/2018     GEN- The patient is well appearing, alert and oriented x 3 today.   Head- normocephalic, atraumatic Eyes-  Sclera clear, conjunctiva pink Ears- hearing intact Oropharynx- clear Neck- supple, no JVP Lymph- no cervical lymphadenopathy Lungs- Clear to ausculation bilaterally, normal work of breathing Heart- regular rate and rhythm, no murmurs, rubs or gallops, PMI not laterally displaced GI- soft, NT, ND, + BS Extremities- no clubbing, cyanosis, or edema MS- no  significant deformity or atrophy Skin- no rash or lesion Psych- euthymic mood, full affect Neuro- strength and sensation are intact  EKG- NSR at 75 bpm, pr int 172 bpm, qrs int 108 ms, qtc 453 ms( per Dr. Rayann Heman acceptable qt for consideration for tikosyn)  Epic records reviewed.    Assessment and Plan: 1. afib S/p ablation 01/08/19 Persistent  atrial flutter occurred several weeks out from ablation and received  successful cardioversion 01/28/19. Then had typical flutter with RVR, successful cardioversion 02/27/19.  Then had recurrence of arrhythmia, went back for EP study with ablation of typical and atypical flutter Maintaining  SR until 4/19 when presenting with a 1:1 atrial flutter, at 200 bpm, to ER with successful cardioversion. Feel that flecainide has run its course and a different AAD may serve the pt better as she is very symptomatic out of rhythm.  Discussed use of Tikosyn as Dr. Rayann Heman feels this is her next AAD choice will plan on admission on May 3rd Will have to stop flecainide 3 days prior to admission Not using any benadryl products  Will check on price of drug with drug plan   Last mag/K+ in ER at acceptable levels for tikosyn use  Drug list sent for screening by PharmD  2. CHA2DS2VASc score of 2 Continue eliquis 5 mg bid  No missed doses   F/u here for admission 5/3   Butch Penny C. Marwah Disbro, [facility] [address] [place], [place] [address] [phone]

## 2019-05-20 NOTE — Telephone Encounter (Signed)
Medication list reviewed in anticipation of upcoming Tikosyn initiation. Patient is taking flecainide which she will need to stop 3 days prior to Tikosyn start. She is not taking any other contraindicated or QTc prolonging medications.   Patient is anticoagulated on Eliquis 5mg  BID on the appropriate dose. Please ensure that patient has not missed any anticoagulation doses in the 3 weeks prior to Tikosyn initiation.   Patient will need to be counseled to avoid use of Benadryl while on Tikosyn and in the 2-3 days prior to Tikosyn initiation.

## 2019-05-23 ENCOUNTER — Other Ambulatory Visit (HOSPITAL_COMMUNITY)
Admission: RE | Admit: 2019-05-23 | Discharge: 2019-05-23 | Disposition: A | Discharge status: Home / Self Care | Payer: Medicare PPO | Source: Ambulatory Visit | Attending: Nurse Practitioner | Admitting: Nurse Practitioner

## 2019-05-23 DIAGNOSIS — Z01812 Encounter for preprocedural laboratory examination: Secondary | ICD-10-CM | POA: Diagnosis present

## 2019-05-23 DIAGNOSIS — Z20822 Contact with and (suspected) exposure to covid-19: Secondary | ICD-10-CM | POA: Insufficient documentation

## 2019-05-23 LAB — SARS CORONAVIRUS 2 (TAT 6-24 HRS): SARS Coronavirus 2: NEGATIVE

## 2019-05-27 ENCOUNTER — Encounter (HOSPITAL_COMMUNITY): Payer: Self-pay | Admitting: Nurse Practitioner

## 2019-05-27 ENCOUNTER — Ambulatory Visit (HOSPITAL_COMMUNITY)
Admission: RE | Admit: 2019-05-27 | Discharge: 2019-05-27 | Disposition: A | Discharge status: Home / Self Care | Payer: Medicare PPO | Source: Ambulatory Visit | Attending: Nurse Practitioner | Admitting: Nurse Practitioner

## 2019-05-27 ENCOUNTER — Other Ambulatory Visit: Payer: Self-pay

## 2019-05-27 ENCOUNTER — Inpatient Hospital Stay (HOSPITAL_COMMUNITY)
Admission: AD | Admit: 2019-05-27 | Discharge: 2019-05-30 | DRG: 310 | Disposition: A | Discharge status: Home / Self Care | Payer: Medicare PPO | Source: Ambulatory Visit | Attending: Internal Medicine | Admitting: Internal Medicine

## 2019-05-27 VITALS — BP 116/74 | HR 125 | Ht 67.0 in | Wt 196.4 lb

## 2019-05-27 DIAGNOSIS — G43909 Migraine, unspecified, not intractable, without status migrainosus: Secondary | ICD-10-CM | POA: Diagnosis present

## 2019-05-27 DIAGNOSIS — I4819 Other persistent atrial fibrillation: Secondary | ICD-10-CM | POA: Diagnosis present

## 2019-05-27 DIAGNOSIS — I442 Atrioventricular block, complete: Secondary | ICD-10-CM | POA: Diagnosis not present

## 2019-05-27 DIAGNOSIS — Z833 Family history of diabetes mellitus: Secondary | ICD-10-CM | POA: Diagnosis not present

## 2019-05-27 DIAGNOSIS — Z79890 Hormone replacement therapy (postmenopausal): Secondary | ICD-10-CM

## 2019-05-27 DIAGNOSIS — Z808 Family history of malignant neoplasm of other organs or systems: Secondary | ICD-10-CM

## 2019-05-27 DIAGNOSIS — I4892 Unspecified atrial flutter: Secondary | ICD-10-CM

## 2019-05-27 DIAGNOSIS — K219 Gastro-esophageal reflux disease without esophagitis: Secondary | ICD-10-CM | POA: Diagnosis present

## 2019-05-27 DIAGNOSIS — Z881 Allergy status to other antibiotic agents status: Secondary | ICD-10-CM | POA: Diagnosis not present

## 2019-05-27 DIAGNOSIS — M8580 Other specified disorders of bone density and structure, unspecified site: Secondary | ICD-10-CM | POA: Diagnosis not present

## 2019-05-27 DIAGNOSIS — D6869 Other thrombophilia: Secondary | ICD-10-CM

## 2019-05-27 DIAGNOSIS — Z825 Family history of asthma and other chronic lower respiratory diseases: Secondary | ICD-10-CM

## 2019-05-27 DIAGNOSIS — I484 Atypical atrial flutter: Principal | ICD-10-CM | POA: Diagnosis present

## 2019-05-27 DIAGNOSIS — Z8261 Family history of arthritis: Secondary | ICD-10-CM | POA: Diagnosis not present

## 2019-05-27 DIAGNOSIS — G4730 Sleep apnea, unspecified: Secondary | ICD-10-CM | POA: Diagnosis present

## 2019-05-27 DIAGNOSIS — Z888 Allergy status to other drugs, medicaments and biological substances status: Secondary | ICD-10-CM

## 2019-05-27 DIAGNOSIS — Z950 Presence of cardiac pacemaker: Secondary | ICD-10-CM | POA: Diagnosis not present

## 2019-05-27 DIAGNOSIS — K449 Diaphragmatic hernia without obstruction or gangrene: Secondary | ICD-10-CM | POA: Diagnosis not present

## 2019-05-27 DIAGNOSIS — Z885 Allergy status to narcotic agent status: Secondary | ICD-10-CM | POA: Diagnosis not present

## 2019-05-27 DIAGNOSIS — Z813 Family history of other psychoactive substance abuse and dependence: Secondary | ICD-10-CM

## 2019-05-27 DIAGNOSIS — Z7901 Long term (current) use of anticoagulants: Secondary | ICD-10-CM | POA: Diagnosis not present

## 2019-05-27 DIAGNOSIS — Z96643 Presence of artificial hip joint, bilateral: Secondary | ICD-10-CM | POA: Diagnosis not present

## 2019-05-27 DIAGNOSIS — M1990 Unspecified osteoarthritis, unspecified site: Secondary | ICD-10-CM | POA: Diagnosis present

## 2019-05-27 DIAGNOSIS — E039 Hypothyroidism, unspecified: Secondary | ICD-10-CM | POA: Diagnosis present

## 2019-05-27 LAB — BASIC METABOLIC PANEL
Anion gap: 9 (ref 5–15)
BUN: 11 mg/dL (ref 8–23)
CO2: 24 mmol/L (ref 22–32)
Calcium: 9.4 mg/dL (ref 8.9–10.3)
Chloride: 105 mmol/L (ref 98–111)
Creatinine, Ser: 0.86 mg/dL (ref 0.44–1.00)
GFR calc Af Amer: >60 mL/min (ref >60)
GFR calc non Af Amer: >60 mL/min (ref >60)
Glucose, Bld: 107 mg/dL — ABNORMAL HIGH (ref 70–99)
Potassium: 4.6 mmol/L (ref 3.5–5.1)
Sodium: 138 mmol/L (ref 135–145)

## 2019-05-27 LAB — MAGNESIUM: Magnesium: 2.1 mg/dL (ref 1.7–2.4)

## 2019-05-27 MED ORDER — MAGNESIUM OXIDE 400 (241.3 MG) MG PO TABS
200.0000 mg | ORAL_TABLET | Freq: Every day | ORAL | Status: DC
  Administered 2019-05-27 – 2019-05-29 (×3): 200 mg via ORAL
  Filled 2019-05-27 (×3): qty 1

## 2019-05-27 MED ORDER — DOFETILIDE 500 MCG PO CAPS
500.0000 ug | ORAL_CAPSULE | Freq: Two times a day (BID) | ORAL | Status: DC
  Administered 2019-05-27 – 2019-05-30 (×6): 500 ug via ORAL
  Filled 2019-05-27 (×6): qty 1

## 2019-05-27 MED ORDER — SODIUM CHLORIDE 0.9% FLUSH: 3.0000 mL | INTRAVENOUS | Status: DC | PRN

## 2019-05-27 MED ORDER — SODIUM CHLORIDE 0.9% FLUSH
3.0000 mL | Freq: Two times a day (BID) | INTRAVENOUS | Status: DC
  Administered 2019-05-27 – 2019-05-30 (×4): 3 mL via INTRAVENOUS

## 2019-05-27 MED ORDER — LEVOTHYROXINE SODIUM 25 MCG PO TABS
125.0000 ug | ORAL_TABLET | Freq: Every day | ORAL | Status: DC
  Administered 2019-05-28 – 2019-05-30 (×3): 125 ug via ORAL
  Filled 2019-05-27 (×3): qty 1

## 2019-05-27 MED ORDER — APIXABAN 5 MG PO TABS
5.0000 mg | ORAL_TABLET | Freq: Two times a day (BID) | ORAL | Status: DC
  Administered 2019-05-27 – 2019-05-30 (×6): 5 mg via ORAL
  Filled 2019-05-27 (×7): qty 1

## 2019-05-27 MED ORDER — MAGNESIUM 250 MG PO TABS: 250.0000 mg | ORAL_TABLET | Freq: Every evening | ORAL | Status: DC

## 2019-05-27 MED ORDER — DILTIAZEM HCL ER COATED BEADS 120 MG PO CP24
120.0000 mg | ORAL_CAPSULE | Freq: Two times a day (BID) | ORAL | Status: DC
  Administered 2019-05-27 – 2019-05-30 (×6): 120 mg via ORAL
  Filled 2019-05-27 (×6): qty 1

## 2019-05-27 MED ORDER — SODIUM CHLORIDE 0.9 % IV SOLN: 250.0000 mL | INTRAVENOUS | Status: DC | PRN

## 2019-05-27 MED ORDER — PANTOPRAZOLE SODIUM 40 MG PO TBEC
40.0000 mg | DELAYED_RELEASE_TABLET | Freq: Every day | ORAL | Status: DC
  Administered 2019-05-28 – 2019-05-30 (×3): 40 mg via ORAL
  Filled 2019-05-27 (×3): qty 1

## 2019-05-27 NOTE — Progress Notes (Signed)
Pt QTC post tikosyn 582, pt with hx of RBBB and prolonged QTC.  Cardiology notified.  No new orders at this time.  Wikll continue to monitor pt closely.

## 2019-05-27 NOTE — Care Management (Signed)
Per Bria H. W/[employer] : Co-pay amount for Dofetilide 500 mcg., 250 ,mcg,125 mcg. twice a day  for a 30 day supply retail pharmacy $10.00 .  Co-pay amount for Dofetilide 500 mcg., 250 ,mcg,125 mcg. twice a day  for a 90 day supply mail order pharmacy  $24.00.  No PA required Deductible not met. Tier 1 Retail pharmacy : [facility] pharmacy  Mail order : [facility]

## 2019-05-27 NOTE — Progress Notes (Signed)
Primary Care Physician: Leamon Arnt, MD Referring Physician: Dr. Chancy Hurter is a 69 y.o. female with a h/o paroxysmal afib that is in the afib clinic at pt's request as she went out of rhythm yesterday am. She  had an ablation 12/15. She  denies any shortness of breath, no extra  fluid. No triggers that she is aware of. She has taken extra 30 mg Cardizem yesterday and an extra 120 mg Cardizem this am but she persists in atrial flutter. This does not feel as uncomfortable to her that the afib did prior to ablation. She  continues on eliquis 5 mg bid for a CHA2DS2VASc score of at least 2.  She continues on her flecainide 50 mg bid. No swallowing or groin issues. EKG shows atrial flutter at 118 bpm.   Returns to clinic 12/31. 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 Marland Kitchen   Has not missed any anticoagulation x for at least 3 weeks.   F/u in afib clinic, 02/05/19. She had successful cardioversion and is staying in SR. She feels improved.   F/u in afib clinic, 2/1. She  went out of rhythm last Friday. Ablation 01/08/19. Extra Cardizem did not convert her. She  feels presyncopal with walking. Her v rate is 108 bpm today with a BP of 112/70. Last DCCV lasted almost one month. She  does not want to go to the ER for urgent cardioversion and  at this point does not  want to change antiarrythmic's. No  missed anticoagulation for CHA2DS2VASc score of 2.  F/u in afib clinic, 03/06/19. She had successful cardioversion 02/27/19. She is in SR today.   F/u in afib clinic 04/30/19. She is now one month out from  her second ablation. She had a typical atrial flutter ablation and some atypical flutter  circuits were found and ablated as well although there were a few atypical flutter circuits that could not be treated as too close to phrenic nerve. She has enjoyed an arrhythmia free period  since 2nd ablation. She is feeling improved.  F/u in afib clinic, 4/26 for a ER visit last week for a 1:1 atrial flutter . She  was cardioverted in the  ER by Dr. Lovena Le. I discussed with Dr. Rayann Heman and he feels that a change in antiarrythmic is needed to tikosyn. Discussed with pt today and she is agreeable to this. No further arrhythmia since cardioversion.   F/u in clinic for [facility] admission, 5/3.She  has been off flecainide since Thursday PM and went out of rhythm Saturday  am. Has not noted very rapid v rates that she has had in the past around 200 bpm. She is in flutter today at 125 bpm and tolerating ok. . No missed anticoagulation, can afford dofetilide at $ 10 a month.  Today, she denies symptoms of palpitations, chest pain, shortness of breath, orthopnea, PND, lower extremity edema, dizziness, presyncope, syncope, or neurologic sequela. The patient is tolerating medications without difficulties and is otherwise without complaint today.   Past Medical History:  Diagnosis Date  . AR (allergic rhinitis)   . Atrial fibrillation ([place])   . Clotting disorder ([place])   . Diverticulosis   . GERD (gastroesophageal reflux disease)   . Hiatal hernia   . Hiatal hernia   . Hypothyroidism   . Migraines   .  Mild sleep apnea   . Osteoarthritis   . Osteopenia after menopause 06/14/2017   DEXA 08/2016 [facility]: T = -1.10 radius, -1.0 femur; lumbar spine elevated due to DJD; recheck 2-3 years.  . Pacemaker    CHB  . Paroxysmal A-fib ([place])   . PUD (peptic ulcer disease)   . Sleep apnea   . Thyroid nodule   . Transient complete heart block 1996   Past Surgical History:  Procedure Laterality Date  . ATRIAL FIBRILLATION ABLATION N/A 01/08/2019   Procedure: ATRIAL FIBRILLATION ABLATION;  Surgeon: [name], MD;  Location: [place] [facility];  Service: Cardiovascular;  Laterality: N/A;  . ATRIAL FIBRILLATION ABLATION N/A 04/02/2019   Procedure: ATRIAL FIBRILLATION ABLATION;  Surgeon: [name],  MD;  Location: [place] [facility];  Service: Cardiovascular;  Laterality: N/A;  . CARDIOVERSION N/A 01/28/2019   Procedure: CARDIOVERSION;  Surgeon: Donato Heinz, MD;  Location: [facility] ENDOSCOPY;  Service: Endoscopy;  Laterality: N/A;  . CARDIOVERSION N/A 02/27/2019   Procedure: CARDIOVERSION;  Surgeon: Donato Heinz, MD;  Location: [facility] ENDOSCOPY;  Service: Endoscopy;  Laterality: N/A;  . HIP ARTHROPLASTY Right 2008  . PACEMAKER GENERATOR CHANGE  03/01/2007   performed by Dr Rosita Fire at [facility])  . PACEMAKER INSERTION  09/08/1994   performed in [facility] for transient complete heart block  . THYROIDECTOMY, PARTIAL  1986   BENIGN NODULES  . TOTAL HIP ARTHROPLASTY Left 02/13/2018   Procedure: TOTAL HIP ARTHROPLASTY ANTERIOR APPROACH;  Surgeon: Paralee Cancel, MD;  Location: [facility];  Service: Orthopedics;  Laterality: Left;  70 mins  . TUBAL LIGATION  1987    Current Outpatient Medications  Medication Sig Dispense Refill  . amoxicillin (AMOXIL) 500 MG capsule Take 2,000 mg by mouth See admin instructions. Take 4 capsules (2000 mg) by mouth 1 hour before dental visits    . CALCIUM PO Take 400 mg by mouth daily.    . cholecalciferol (VITAMIN D) 25 MCG (1000 UNIT) tablet Take 1,000 Units by mouth daily.     . Cyanocobalamin (B-12) 1000 MCG SUBL Place 1 mL under the tongue once a week.     . diclofenac sodium (VOLTAREN) 1 % GEL Apply 2 g topically 4 (four) times daily as needed. (Patient taking differently: Apply 2 g topically 4 (four) times daily as needed (pain.). ) 100 g 5  . diltiazem (CARDIZEM CD) 120 MG 24 hr capsule Take 1 capsule (120 mg total) by mouth 2 (two) times daily. May take extra 1 tablet daily for breakthrough afib. 190 capsule 2  . diltiazem (CARDIZEM) 30 MG tablet Take 1 tablet every 4 hours AS NEEDED for AFIB heart rate >100 (Patient taking differently: No sig reported) 45 tablet 3  . ELIQUIS 5 MG TABS tablet TAKE 1 TABLET BY MOUTH TWICE DAILY.  (Patient taking differently: Take 5 mg by mouth 2 (two) times daily. ) 180 tablet 1  . flecainide (TAMBOCOR) 50 MG tablet TAKE 1 TABLET BY MOUTH TWICE DAILY. (Patient taking differently: medication is on hold as of 4/29) 60 tablet 3  . levothyroxine (SYNTHROID) 125 MCG tablet TAKE 1 TABLET BY MOUTH DAILY. (Patient taking differently: Take 125 mcg by mouth daily before breakfast. ) 90 tablet 0  . Magnesium 250 MG TABS Take 250 mg by mouth every evening.     Marland Kitchen omeprazole (PRILOSEC) 20 MG capsule TAKE (1) CAPSULE DAILY. (Patient taking differently: Take 20 mg by mouth daily before breakfast. ) 90 capsule 3  . Potassium  99 MG TABS Take 99 mg by mouth daily.     . sodium chloride (OCEAN) 0.65 % SOLN nasal spray Place 1 spray into both nostrils 2 (two) times daily.     . traMADol (ULTRAM) 50 MG tablet Take 1 tablet (50 mg total) by mouth every 6 (six) hours as needed for moderate pain. 30 tablet 0  . Wheat Dextrin (BENEFIBER) POWD Taking by mouth 1 tablespoon in water once daily     No current facility-administered medications for this encounter.    Allergies  Allergen Reactions  . Morphine And Related Hives and Itching  . Neomycin Other (See Comments)    itching   . Adhesive [Tape] Rash    Blistering rash     Social History   Socioeconomic History  . Marital status: Married    Spouse name: Not on file  . Number of children: 0  . Years of education: Not on file  . Highest education level: Not on file  Occupational History  . Occupation: retired  Tobacco Use  . Smoking status: Never Smoker  . Smokeless tobacco: Never Used  Substance and Sexual Activity  . Alcohol use: Not Currently    Alcohol/week: 1.0 standard drinks    Types: 1 Glasses of wine per week    Comment: occasionally  . Drug use: No  . Sexual activity: Not Currently  Other Topics Concern  . 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 Difficulty of Paying Living Expenses:   Food Insecurity:   . Worried About Charity fundraiser in the Last Year:   . Arboriculturist in the Last Year:   Transportation Needs:   . Film/video editor (Medical):   Marland Kitchen Lack of Transportation (Non-Medical):   Physical Activity:   . Days of Exercise per Week:   . Minutes of Exercise per Session:   Stress:   . Feeling of Stress :   Social Connections:   . Frequency of Communication with Friends and Family:   . Frequency of Social Gatherings with Friends and Family:   . Attends Religious Services:   . Active Member of Clubs or Organizations:   . Attends Archivist Meetings:   Marland Kitchen Marital Status:   Intimate Partner Violence:   . Fear of Current or Ex-Partner:   . Emotionally Abused:   Marland Kitchen Physically Abused:   . Sexually Abused:     Family History  Problem Relation Age of Onset  . Diabetes Mellitus II Maternal Uncle   . Cancer Maternal Grandmother        ENDOMETRIAL  . Rheum arthritis Mother   . Peptic Ulcer Mother   . COPD Brother   . Drug abuse Brother   . Breast cancer Neg Hx     ROS- All systems are reviewed and negative except as per the HPI above  Physical Exam: Vitals:   05/27/19 1120  BP: 116/74  Pulse: (!) 125  Weight: 89.1 kg  Height: 5\' 7"  (1.702 m)   Wt Readings from Last 3 Encounters:  05/27/19 89.1 kg  05/20/19 90.6 kg  05/13/19 88.9 kg    Labs: Lab Results  Component Value Date   NA 137 05/13/2019   K 4.4 05/13/2019   CL 101 05/13/2019   CO2 20 (L) 05/13/2019   GLUCOSE 114 (  H) 05/13/2019   BUN 24 (H) 05/13/2019   CREATININE 1.33 (H) 05/13/2019   CALCIUM 10.1 05/13/2019   PHOS 4.0 05/13/2019   MG 2.3 05/13/2019   Lab Results  Component Value Date   INR 0.87 02/13/2018   Lab Results  Component Value Date   CHOL 187 10/08/2018   HDL 73.50 10/08/2018   [place] 95 10/08/2018   TRIG 93.0 10/08/2018     GEN- The patient is  well appearing, alert and oriented x 3 today.   Head- normocephalic, atraumatic Eyes-  Sclera clear, conjunctiva pink Ears- hearing intact Oropharynx- clear Neck- supple, no JVP Lymph- no cervical lymphadenopathy Lungs- Clear to ausculation bilaterally, normal work of breathing Heart- irregular rate and rhythm, no murmurs, rubs or gallops, PMI not laterally displaced GI- soft, NT, ND, + BS Extremities- no clubbing, cyanosis, or edema MS- no significant deformity or atrophy Skin- no rash or lesion Psych- euthymic mood, full affect Neuro- strength and sensation are intact  EKG- atrial flutter at 125 bpm, IRBBB  Last EKG in  NSR at 75 bpm, pr int 172 bpm, qrs int 108 ms, qtc 453 ms( per Dr. Rayann Heman acceptable qt for consideration for tikosyn)  Epic records reviewed.    Assessment and Plan: 1. afib S/p ablation 01/08/19 Persistent  atrial flutter occurred several weeks out from ablation and received  successful cardioversion 01/28/19. Then had typical flutter with RVR, successful cardioversion 02/27/19.  Then had recurrence of arrhythmia, went back for EP study with ablation of typical and atypical flutter, had a few atypical atrial flutters that could not be ablated due to vicinity of phrenic nerve  Maintaining  SR until 4/19 when presenting with a 1:1 atrial flutter, at 200 bpm, to ER with successful cardioversion. Discussed use of Tikosyn as Dr. Rayann Heman feels this is her next AAD choice will plan on admission on May 3rd Has stopped  flecainide 3 days prior to admission Not using any benadryl products  Checked  on price of drug with drug plan, $10    Bmet/mag stable for admit   2. CHA2DS2VASc score of 2 Continue eliquis 5 mg bid  No missed doses for ast least 3 weeks    To 6 E when bed available    Butch Penny C. Jahayra Mazo, [facility] [address] [place], [place] [address] [phone]

## 2019-05-27 NOTE — Progress Notes (Signed)
Pharmacy: Dofetilide (Tikosyn) - Initial Consult Assessment and Electrolyte Replacement  Pharmacy consulted to assist in monitoring and replacing electrolytes in this 69 y.o. female admitted on 05/27/2019 undergoing dofetilide initiation. First dofetilide dose: 5/3@2000   Assessment:  Patient Exclusion Criteria: If any screening criteria checked as "Yes", then  patient  should NOT receive dofetilide until criteria item is corrected.  If "Yes" please indicate correction plan.  YES  NO Patient  Exclusion Criteria Correction Plan   [x]   []   Baseline QTc interval is greater than or equal to 440 msec. IF above YES box checked dofetilide contraindicated unless patient has ICD; then may proceed if QTc 500-550 msec or with known ventricular conduction abnormalities may proceed with QTc 550-600 msec.  QTc 583 w/ RBBB and HR 120s - okay to initiate per EP (based on previous EKG when in NSR)    []   [x]   Patient is known or suspected to have a digoxin level greater than 2 ng/ml: No results found for: DIGOXIN     []   [x]   Creatinine clearance less than 20 ml/min (calculated using Cockcroft-Gault, actual body weight and serum creatinine): Estimated Creatinine Clearance: 72.8 mL/min (by C-G formula based on SCr of 0.86 mg/dL).     []   [x]  Patient has received drugs known to prolong the QT intervals within the last 48 hours (phenothiazines, tricyclics or tetracyclic antidepressants, erythromycin, H-1 antihistamines, cisapride, fluoroquinolones, azithromycin). Updated information on QT prolonging agents is available to be searched on the following database:QT prolonging agents     []   [x]   Patient received a dose of hydrochlorothiazide (Oretic) alone or in any combination including triamterene (Dyazide, Maxzide) in the last 48 hours.    []   [x]  Patient received a medication known to increase dofetilide plasma concentrations prior to initial dofetilide dose:  . Trimethoprim (Primsol, Proloprim) in  the last 36 hours . Verapamil (Calan, Verelan) in the last 36 hours or a sustained release dose in the last 72 hours . Megestrol (Megace) in the last 5 days  . Cimetidine (Tagamet) in the last 6 hours . Ketoconazole (Nizoral) in the last 24 hours . Itraconazole (Sporanox) in the last 48 hours  . Prochlorperazine (Compazine) in the last 36 hours     []   [x]   Patient is known to have a history of torsades de pointes; congenital or acquired long QT syndromes.    []   [x]   Patient has received a Class 1 antiarrhythmic with less than 2 half-lives since last dose. (Disopyramide, Quinidine, Procainamide, Lidocaine, Mexiletine, Flecainide, Propafenone) Has been off flecainide for 3 days   []   [x]   Patient has received amiodarone therapy in the past 3 months or amiodarone level is greater than 0.3 ng/ml.    Patient has been appropriately anticoagulated with Apixaban.  Labs:    Component Value Date/Time   K 4.6 05/27/2019 1117   MG 2.1 05/27/2019 1117     Plan: Potassium: K >/= 4: Appropriate to initiate Tikosyn, no replacement needed    Magnesium: Mg >2: Appropriate to initiate Tikosyn, no replacement needed     Thank you for allowing pharmacy to participate in this patient's care   Antonietta Jewel, PharmD, [place] Pharmacist  Phone: [phone] 05/27/2019 1:46 PM  Please check AMION for all [place] phone numbers After 10:00 PM, call [place] [phone]

## 2019-05-27 NOTE — H&P (Signed)
Primary Care Physician: Leamon Arnt, MD  Referring Physician: Dr. Chancy Hurter is a 69 y.o. female with a h/o paroxysmal afib that is in the afib clinic at pt's request as she went out of rhythm yesterday am. She had an ablation 12/15. She denies any shortness of breath, no extra fluid. No triggers that she is aware of. She has taken extra 30 mg Cardizem yesterday and an extra 120 mg Cardizem this am but she persists in atrial flutter. This does not feel as uncomfortable to her that the afib did prior to ablation. She continues on eliquis 5 mg bid for a CHA2DS2VASc score of at least 2. She continues on her flecainide 50 mg bid. No swallowing or groin issues. EKG shows atrial flutter at 118 bpm.  Returns to clinic 12/31. Taking extra flecainide did not convert pt from atrial flutter, so she will go back to 50 mg bid as she has a IRBBB at baseline.will plan for DCCV on Monday, 1/4. Marland Kitchen Has not missed any anticoagulation x for at least 3 weeks.  F/u in afib clinic, 02/05/19. She had successful cardioversion and is staying in SR. She feels improved.  F/u in afib clinic, 2/1. She went out of rhythm last Friday. Ablation 01/08/19. Extra Cardizem did not convert her. She feels presyncopal with walking. Her v rate is 108 bpm today with a BP of 112/70. Last DCCV lasted almost one month. She does not want to go to the ER for urgent cardioversion and at this point does not want to change antiarrythmic's. No missed anticoagulation for CHA2DS2VASc score of 2.  F/u in afib clinic, 03/06/19. She had successful cardioversion 02/27/19. She is in SR today.  F/u in afib clinic 04/30/19. She is now one month out from her second ablation. She had a typical atrial flutter ablation and some atypical flutter circuits were found and ablated as well although there were a few atypical flutter circuits that could not be treated as too close to phrenic nerve. She has enjoyed an arrhythmia free period since 2nd ablation. She is  feeling improved.  F/u in afib clinic, 4/26 for a ER visit last week for a 1:1 atrial flutter . She was cardioverted in the ER by Dr. Lovena Le. I discussed with Dr. Rayann Heman and he feels that a change in antiarrythmic is needed to tikosyn. Discussed with pt today and she is agreeable to this. No further arrhythmia since cardioversion.  F/u in clinic for [place] admission, 5/3.She has been off flecainide since Thursday PM and went out of rhythm Saturday am. Has not noted very rapid v rates that she has had in the past around 200 bpm. She is in flutter today at 125 bpm and tolerating ok. . No missed anticoagulation, can afford dofetilide at $ 10 a month.  Today, she denies symptoms of palpitations, chest pain, shortness of breath, orthopnea, PND, lower extremity edema, dizziness, presyncope, syncope, or neurologic sequela. The patient is tolerating medications without difficulties and is otherwise without complaint today.      Past Medical History:  Diagnosis Date  . AR (allergic rhinitis)   . Atrial fibrillation ([place])   . Clotting disorder ([place])   . Diverticulosis   . GERD (gastroesophageal reflux disease)   . Hiatal hernia   . Hiatal hernia   . Hypothyroidism   . Migraines   . Mild sleep apnea   . Osteoarthritis   . Osteopenia after menopause 06/14/2017   DEXA 08/2016 [facility]: T = -1.10  radius, -1.0 femur; lumbar spine elevated due to DJD; recheck 2-3 years.  . Pacemaker    CHB  . Paroxysmal A-fib ([place])   . PUD (peptic ulcer disease)   . Sleep apnea   . Thyroid nodule   . Transient complete heart block 1996        Past Surgical History:  Procedure Laterality Date  . ATRIAL FIBRILLATION ABLATION N/A 01/08/2019   Procedure: ATRIAL FIBRILLATION ABLATION; Surgeon: [name], MD; Location: [facility] LAB; Service: Cardiovascular; Laterality: N/A;  . ATRIAL FIBRILLATION ABLATION N/A 04/02/2019   Procedure: ATRIAL FIBRILLATION ABLATION; Surgeon: [name], MD; Location: [facility]  LAB; Service: Cardiovascular; Laterality: N/A;  . CARDIOVERSION N/A 01/28/2019   Procedure: CARDIOVERSION; Surgeon: Donato Heinz, MD; Location: [facility] ENDOSCOPY; Service: Endoscopy; Laterality: N/A;  . CARDIOVERSION N/A 02/27/2019   Procedure: CARDIOVERSION; Surgeon: Donato Heinz, MD; Location: [facility] ENDOSCOPY; Service: Endoscopy; Laterality: N/A;  . HIP ARTHROPLASTY Right 2008  . PACEMAKER GENERATOR CHANGE  03/01/2007   performed by Dr Rosita Fire at [facility])  . PACEMAKER INSERTION  09/08/1994   performed in [facility] for transient complete heart block  . THYROIDECTOMY, PARTIAL  1986   BENIGN NODULES  . TOTAL HIP ARTHROPLASTY Left 02/13/2018   Procedure: TOTAL HIP ARTHROPLASTY ANTERIOR APPROACH; Surgeon: Paralee Cancel, MD; Location: [facility] ORS; Service: Orthopedics; Laterality: Left; 70 mins  . TUBAL LIGATION  1987         Current Outpatient Medications  Medication Sig Dispense Refill  . amoxicillin (AMOXIL) 500 MG capsule Take 2,000 mg by mouth See admin instructions. Take 4 capsules (2000 mg) by mouth 1 hour before dental visits    . CALCIUM PO Take 400 mg by mouth daily.    . cholecalciferol (VITAMIN D) 25 MCG (1000 UNIT) tablet Take 1,000 Units by mouth daily.     . Cyanocobalamin (B-12) 1000 MCG SUBL Place 1 mL under the tongue once a week.     . diclofenac sodium (VOLTAREN) 1 % GEL Apply 2 g topically 4 (four) times daily as needed. (Patient taking differently: Apply 2 g topically 4 (four) times daily as needed (pain.). ) 100 g 5  . diltiazem (CARDIZEM CD) 120 MG 24 hr capsule Take 1 capsule (120 mg total) by mouth 2 (two) times daily. May take extra 1 tablet daily for breakthrough afib. 190 capsule 2  . diltiazem (CARDIZEM) 30 MG tablet Take 1 tablet every 4 hours AS NEEDED for AFIB heart rate >100 (Patient taking differently: No sig reported) 45 tablet 3  . ELIQUIS 5 MG TABS tablet TAKE 1 TABLET BY MOUTH TWICE DAILY. (Patient taking differently: Take 5 mg by  mouth 2 (two) times daily. ) 180 tablet 1  . flecainide (TAMBOCOR) 50 MG tablet TAKE 1 TABLET BY MOUTH TWICE DAILY. (Patient taking differently: medication is on hold as of 4/29) 60 tablet 3  . levothyroxine (SYNTHROID) 125 MCG tablet TAKE 1 TABLET BY MOUTH DAILY. (Patient taking differently: Take 125 mcg by mouth daily before breakfast. ) 90 tablet 0  . Magnesium 250 MG TABS Take 250 mg by mouth every evening.     Marland Kitchen omeprazole (PRILOSEC) 20 MG capsule TAKE (1) CAPSULE DAILY. (Patient taking differently: Take 20 mg by mouth daily before breakfast. ) 90 capsule 3  . Potassium 99 MG TABS Take 99 mg by mouth daily.     . sodium chloride (OCEAN) 0.65 % SOLN nasal spray Place 1 spray into both nostrils 2 (two) times daily.     Marland Kitchen  traMADol (ULTRAM) 50 MG tablet Take 1 tablet (50 mg total) by mouth every 6 (six) hours as needed for moderate pain. 30 tablet 0  . Wheat Dextrin (BENEFIBER) POWD Taking by mouth 1 tablespoon in water once daily     No current facility-administered medications for this encounter.        Allergies  Allergen Reactions  . Morphine And Related Hives and Itching  . Neomycin Other (See Comments)    itching   . Adhesive [Tape] Rash    Blistering rash    Social History        Socioeconomic History  . Marital status: Married    Spouse name: Not on file  . Number of children: 0  . Years of education: Not on file  . Highest education level: Not on file  Occupational History  . Occupation: retired  Tobacco Use  . Smoking status: Never Smoker  . Smokeless tobacco: Never Used  Substance and Sexual Activity  . Alcohol use: Not Currently    Alcohol/week: 1.0 standard drinks    Types: 1 Glasses of wine per week    Comment: occasionally  . Drug use: No  . Sexual activity: Not Currently  Other Topics Concern  . 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 Difficulty of Paying Living Expenses:   Food Insecurity:   . Worried About Charity fundraiser in the Last Year:   . Arboriculturist in the Last Year:   Transportation Needs:   . Film/video editor (Medical):   Marland Kitchen Lack of Transportation (Non-Medical):   Physical Activity:   . Days of Exercise per Week:   . Minutes of Exercise per Session:   Stress:   . Feeling of Stress :   Social Connections:   . Frequency of Communication with Friends and Family:   . Frequency of Social Gatherings with Friends and Family:   . Attends Religious Services:   . Active Member of Clubs or Organizations:   . Attends Archivist Meetings:   Marland Kitchen Marital Status:   Intimate Partner Violence:   . Fear of Current or Ex-Partner:   . Emotionally Abused:   Marland Kitchen Physically Abused:   . Sexually Abused:         Family History  Problem Relation Age of Onset  . Diabetes Mellitus II Maternal Uncle   . Cancer Maternal Grandmother    ENDOMETRIAL  . Rheum arthritis Mother   . Peptic Ulcer Mother   . COPD Brother   . Drug abuse Brother   . Breast cancer Neg Hx    ROS- All systems are reviewed and negative except as per the HPI above  Physical Exam:     Vitals:   05/27/19 1120  BP: 116/74  Pulse: (!) 125  Weight: 89.1 kg  Height: 5\' 7"  (1.702 m)      Wt Readings from Last 3 Encounters:  05/27/19 89.1 kg  05/20/19 90.6 kg  05/13/19 88.9 kg   Labs:  Recent Labs  Recent Labs                       Recent Labs                                        GEN- The patient is well appearing, alert and oriented x 3 today.  Head- normocephalic, atraumatic  Eyes- Sclera clear, conjunctiva pink  Ears- hearing intact  Oropharynx- clear  Neck- supple, no JVP  Lymph- no cervical lymphadenopathy  Lungs- Clear to ausculation bilaterally, normal work of breathing  Heart-  irregular rate and rhythm, no murmurs, rubs or gallops, PMI not laterally displaced  GI- soft, NT, ND, + BS  Extremities- no clubbing, cyanosis, or edema  MS- no significant deformity or atrophy  Skin- no rash or lesion  Psych- euthymic mood, full affect  Neuro- strength and sensation are intact  EKG- atrial flutter at 125 bpm, IRBBB  Last EKG in NSR at 75 bpm, pr int 172 bpm, qrs int 108 ms, qtc 453 ms( per Dr. Rayann Heman acceptable qt for consideration for tikosyn)  Epic records reviewed.  Assessment and Plan:  1. afib  S/p ablation 01/08/19  Persistent atrial flutter occurred several weeks out from ablation and received successful cardioversion 01/28/19.  Then had typical flutter with RVR, successful cardioversion 02/27/19.  Then had recurrence of arrhythmia, went back for EP study with ablation of typical and atypical flutter, had a few atypical atrial flutters that could not be ablated due to vicinity of phrenic nerve  Maintaining SR until 4/19 when presenting with a 1:1 atrial flutter, at 200 bpm, to ER with successful cardioversion.  Discussed use of Tikosyn as Dr. Rayann Heman feels this is her next AAD choice will plan on admission on May 3rd  Has stopped flecainide 3 days prior to admission  Not using any benadryl products  Checked on price of drug with drug plan, $10  Bmet/mag stable for admit  2. CHA2DS2VASc score of 2  Continue eliquis 5 mg bid  No missed doses for ast least 3 weeks  To 6 E when bed available  Butch Penny C. Mila Homer  [facility]  [address]  [place], [place] [address]  [phone]  EP Attending  Patient seen and examined. Agree with the findings as above. The patient is presents for initiation of dofetilide. She was cardioverted 2 weeks ago and has been on flecainide which she stopped a couple of days ago and has reverted back to atrial fib/flutter. She presents for initiation of dofetilide. Findings as noted above by Roderic Palau,  ANP-C. We will follow her electrolytes and her QT carefully. DCCV on 5/5 if she has not reverted back to NSR.  Mikle Bosworth.D.

## 2019-05-27 NOTE — Progress Notes (Signed)
Noted patient hx of prolonged QTC.  Spoke with pharmacist and stated medication approved with hx per EP.  Rechecked EKG with QTC 426.  Tikosyn administered as prescribed.

## 2019-05-28 ENCOUNTER — Encounter (HOSPITAL_COMMUNITY): Payer: Self-pay | Admitting: Certified Registered Nurse Anesthetist

## 2019-05-28 DIAGNOSIS — I4819 Other persistent atrial fibrillation: Secondary | ICD-10-CM

## 2019-05-28 DIAGNOSIS — I484 Atypical atrial flutter: Secondary | ICD-10-CM | POA: Diagnosis not present

## 2019-05-28 LAB — BASIC METABOLIC PANEL
Anion gap: 9 (ref 5–15)
BUN: 16 mg/dL (ref 8–23)
CO2: 23 mmol/L (ref 22–32)
Calcium: 9.1 mg/dL (ref 8.9–10.3)
Chloride: 104 mmol/L (ref 98–111)
Creatinine, Ser: 0.86 mg/dL (ref 0.44–1.00)
GFR calc Af Amer: >60 mL/min (ref >60)
GFR calc non Af Amer: >60 mL/min (ref >60)
Glucose, Bld: 103 mg/dL — ABNORMAL HIGH (ref 70–99)
Potassium: 3.9 mmol/L (ref 3.5–5.1)
Sodium: 136 mmol/L (ref 135–145)

## 2019-05-28 LAB — MAGNESIUM: Magnesium: 2.1 mg/dL (ref 1.7–2.4)

## 2019-05-28 LAB — HIV ANTIBODY (ROUTINE TESTING W REFLEX): HIV Screen 4th Generation wRfx: NONREACTIVE

## 2019-05-28 MED ORDER — POTASSIUM CHLORIDE CRYS ER 20 MEQ PO TBCR
40.0000 meq | EXTENDED_RELEASE_TABLET | Freq: Once | ORAL | Status: AC
  Administered 2019-05-28: 09:00:00 40 meq via ORAL
  Filled 2019-05-28: qty 2

## 2019-05-28 NOTE — Care Management (Signed)
05-28-19 1128 Patient presented for Tikosyn Load. Patient is aware of cost. Patient uses [place] [facility] and they have [place] in [place]. Patient would like a Rx for 30 day supply e-scribed to [facility].  No further needs from Case Manager at this time. Bethena Roys, RN,BSN Case Manager

## 2019-05-28 NOTE — Progress Notes (Signed)
VS taken during admission caused a Yellow MEWS score due to elevated HR. MEWS protocol initiated. Agricultural consultant, [employer] notified.    05/27/19 1400  Assess: MEWS Score  Temp 98 F (36.7 C)  BP 105/69  ECG Heart Rate (!) 124  Resp 16  Level of Consciousness Alert  SpO2 99 %  O2 Device Room Air  Assess: MEWS Score  MEWS Temp 0  MEWS Systolic 0  MEWS Pulse 2  MEWS RR 0  MEWS LOC 0  MEWS Score 2  MEWS Score Color Yellow  Assess: if the MEWS score is Yellow or Red  Were vital signs taken at a resting state? Yes  Focused Assessment Documented focused assessment  Early Detection of Sepsis Score *See Row Information* Low  MEWS guidelines implemented *See Row Information* Yes  Treat  MEWS Interventions Escalated (See documentation below) (HR up - Tikosyn load (atrial fib))  Take Vital Signs  Increase Vital Sign Frequency  Yellow: Q 2hr X 2 then Q 4hr X 2, if remains yellow, continue Q 4hrs  Escalate  MEWS: Escalate Yellow: discuss with charge nurse/RN and consider discussing with provider and RRT  Notify: Charge Nurse/RN  Name of Charge Nurse/RN Notified Clint Lipps  Date Charge Nurse/RN Notified 05/27/19  Time Charge Nurse/RN Notified 1410  Document  Patient Outcome Other (Comment) (admit with elevated HR - medication initiation this evening)   Albertina Senegal E

## 2019-05-28 NOTE — Progress Notes (Signed)
Pt converted to NSR. Bhagat, [place] notified. EKG performed.

## 2019-05-28 NOTE — Progress Notes (Signed)
Pharmacy: Dofetilide (Tikosyn) - Follow Up Assessment and Electrolyte Replacement  Pharmacy consulted to assist in monitoring and replacing electrolytes in this 69 y.o. female admitted on 05/27/2019 undergoing dofetilide initiation. First dofetilide dose: 500 mcg q 12 hrs  Labs:    Component Value Date/Time   K 3.9 05/28/2019 0354   MG 2.1 05/28/2019 0354     Plan: Potassium: K 3.8-3.9:  Give KCl 40 mEq po x1   Magnesium: Mg > 2: No additional supplementation needed    Thank you for allowing pharmacy to participate in this patient's care   Marguerite Olea, [facility] Clinical Pharmacist Phone [phone]  05/28/2019 7:30 AM

## 2019-05-28 NOTE — Progress Notes (Addendum)
   Progress Note   Subjective   Doing well today, the patient denies CP or SOB.  No new concerns  Inpatient Medications    Scheduled Meds: . apixaban  5 mg Oral BID  . diltiazem  120 mg Oral BID  . dofetilide  500 mcg Oral BID  . levothyroxine  125 mcg Oral Q0600  . magnesium oxide  200 mg Oral Daily  . pantoprazole  40 mg Oral Daily  . potassium chloride  40 mEq Oral Once  . sodium chloride flush  3 mL Intravenous Q12H   Continuous Infusions: . sodium chloride     PRN Meds: sodium chloride, sodium chloride flush   Vital Signs    Vitals:   05/27/19 1857 05/27/19 2017 05/28/19 0011 05/28/19 0407  BP: 129/83 111/85 118/78 95/65  Pulse:  (!) 103 (!) 109 78  Resp:      Temp:  98 F (36.7 C) 98.1 F (36.7 C) 97.8 F (36.6 C)  TempSrc:  Oral Oral Oral  SpO2:  96% 98% 97%  Weight:    88.4 kg   No intake or output data in the 24 hours ending 05/28/19 0756 Filed Weights   05/28/19 0407  Weight: 88.4 kg    Telemetry    Atypical atrial flutter - Personally Reviewed  Physical Exam   GEN- The patient is well appearing, alert and oriented x 3 today.   Head- normocephalic, atraumatic Eyes-  Sclera clear, conjunctiva pink Ears- hearing intact Oropharynx- clear Neck- supple, Lungs-  normal work of breathing Heart- Regular rate and rhythm  GI- soft  Extremities- no clubbing, cyanosis, or edema  MS- no significant deformity or atrophy Skin- no rash or lesion Psych- euthymic mood, full affect Neuro- strength and sensation are intact   Labs    Chemistry Recent Labs  Lab 05/27/19 1117 05/28/19 0354  NA 138 136  K 4.6 3.9  CL 105 104  CO2 24 23  GLUCOSE 107* 103*  BUN 11 16  CREATININE 0.86 0.86  CALCIUM 9.4 9.1  GFRNONAA >60 >60  GFRAA >60 >60  ANIONGAP 9 9     HematologyNo results for input(s): WBC, RBC, HGB, HCT, MCV, MCH, MCHC, RDW, PLT in the last 168 hours.    Assessment & Plan    1.  Afib/ atypical atrial flutter Qt is stable thus far.   Continue tikosyn load. Will make NPO after midnight for cardioversion tomorrow if still in AF Continue eliquis  2. Transient complete heart block S/p PPM Stable with device programmed VVI 35 bpm No need to interrogate pacemaker with cardioversion tomorrow No change required today   [name] MD, [facility] 05/28/2019 7:56 AM

## 2019-05-29 ENCOUNTER — Encounter (HOSPITAL_COMMUNITY): Payer: Self-pay | Admitting: Internal Medicine

## 2019-05-29 ENCOUNTER — Encounter (HOSPITAL_COMMUNITY)
Admission: AD | Disposition: A | Discharge status: Home / Self Care | Payer: Self-pay | Source: Ambulatory Visit | Attending: Internal Medicine

## 2019-05-29 DIAGNOSIS — I484 Atypical atrial flutter: Secondary | ICD-10-CM | POA: Diagnosis not present

## 2019-05-29 DIAGNOSIS — I4819 Other persistent atrial fibrillation: Secondary | ICD-10-CM | POA: Diagnosis not present

## 2019-05-29 LAB — BASIC METABOLIC PANEL
Anion gap: 10 (ref 5–15)
BUN: 14 mg/dL (ref 8–23)
CO2: 24 mmol/L (ref 22–32)
Calcium: 8.9 mg/dL (ref 8.9–10.3)
Chloride: 99 mmol/L (ref 98–111)
Creatinine, Ser: 0.77 mg/dL (ref 0.44–1.00)
GFR calc Af Amer: >60 mL/min (ref >60)
GFR calc non Af Amer: >60 mL/min (ref >60)
Glucose, Bld: 100 mg/dL — ABNORMAL HIGH (ref 70–99)
Potassium: 4.2 mmol/L (ref 3.5–5.1)
Sodium: 133 mmol/L — ABNORMAL LOW (ref 135–145)

## 2019-05-29 LAB — MAGNESIUM: Magnesium: 2.1 mg/dL (ref 1.7–2.4)

## 2019-05-29 SURGERY — CARDIOVERSION
Anesthesia: General

## 2019-05-29 NOTE — Progress Notes (Signed)
Pharmacy: Dofetilide (Tikosyn) - Follow Up Assessment and Electrolyte Replacement  Pharmacy consulted to assist in monitoring and replacing electrolytes in this 69 y.o. female admitted on 05/27/2019 undergoing dofetilide initiation. First dofetilide dose: 500 mcg q 12 hrs  Labs:    Component Value Date/Time   K 4.2 05/29/2019 0512   MG 2.1 05/29/2019 [id number]     Plan: Potassium: K >/= 4: No additional supplementation needed  Magnesium: Mg > 2: No additional supplementation needed    Thank you for allowing pharmacy to participate in this patient's care   Antonietta Jewel, PharmD, [place] Pharmacist  Phone: [phone] 05/29/2019 7:16 AM  Please check AMION for all [place] phone numbers After 10:00 PM, call [place] [phone]

## 2019-05-29 NOTE — Progress Notes (Addendum)
Progress Note  Patient Name: Kristin [name] Date of Encounter: 05/29/2019  Primary Electrophysiologist: Dr. Rayann Heman   Subjective   Please she is back in [place], feels well  Inpatient Medications    Scheduled Meds: . apixaban  5 mg Oral BID  . diltiazem  120 mg Oral BID  . dofetilide  500 mcg Oral BID  . levothyroxine  125 mcg Oral Q0600  . magnesium oxide  200 mg Oral Daily  . pantoprazole  40 mg Oral Daily  . sodium chloride flush  3 mL Intravenous Q12H   Continuous Infusions: . sodium chloride     PRN Meds: sodium chloride, sodium chloride flush   Vital Signs    Vitals:   05/28/19 2009 05/28/19 2348 05/29/19 0424 05/29/19 0805  BP: 119/66 98/63 98/61  110/72  Pulse: 79 73 69 64  Resp:    17  Temp: 98.3 F (36.8 C) 97.8 F (36.6 C) 97.6 F (36.4 C) 98.3 F (36.8 C)  TempSrc: Oral Oral Oral Oral  SpO2: 100% 99% 100% 100%  Weight:   89.9 kg     Intake/Output Summary (Last 24 hours) at 05/29/2019 0835 Last data filed at 05/28/2019 2000 Gross per 24 hour  Intake 724 ml  Output --  Net 724 ml   Last 3 Weights 05/29/2019 05/28/2019 05/27/2019  Weight (lbs) 198 lb 1.6 oz 194 lb 12.8 oz 196 lb 6.4 oz  Weight (kg) 89.858 kg 88.361 kg 89.086 kg      Telemetry    SR 70's - Personally Reviewed  ECG    SR 76bpm, measured QT 420-440, QTc 473-495 - Personally Reviewed  Physical Exam   GEN: No acute distress.   Neck: No JVD Cardiac: RRR, no murmurs, rubs, or gallops.  Respiratory: CTA b/l, normal WOB. GI: Soft, nontender, non-distended  MS: No edema; No deformity. Neuro:  Nonfocal  Psych: Normal affect   Labs    High Sensitivity Troponin:   Recent Labs  Lab 05/13/19 1443 05/13/19 1643  TROPONINIHS 8 7      Chemistry Recent Labs  Lab 05/27/19 1117 05/28/19 0354 05/29/19 0512  NA 138 136 133*  K 4.6 3.9 4.2  CL 105 104 99  CO2 24 23 24   GLUCOSE 107* 103* 100*  BUN 11 16 14   CREATININE 0.86 0.86 0.77  CALCIUM 9.4 9.1 8.9  GFRNONAA >60 >60 >60   GFRAA >60 >60 >60  ANIONGAP 9 9 10      HematologyNo results for input(s): WBC, RBC, HGB, HCT, MCV, MCH, MCHC, RDW, PLT in the last 168 hours.  BNPNo results for input(s): BNP, PROBNP in the last 168 hours.   DDimer No results for input(s): DDIMER in the last 168 hours.   Radiology    No results found.  Cardiac Studies   07/24/2018: TTE IMPRESSIONS  1. The left ventricle has normal systolic function with an ejection  fraction of 60-65%. The cavity size was normal. Left ventricular diastolic  Doppler parameters are consistent with impaired relaxation. No evidence of  left ventricular regional wall  motion abnormalities.  2. The right ventricle has normal systolic function. The cavity was  normal. There is no increase in right ventricular wall thickness.  3. Left atrial size was mildly dilated.  4. Right atrial size was mildly dilated.  5. Mild mitral valve prolapse.  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 Mild thickening of the mitral valve  leaflet.  7. The aortic valve is tricuspid.   Patient Profile  69 y.o. female w/PMHx of CHB/PPM, hypothyroidism, persistent AFib/flutter  Stopped Flecainide 3 days prior to her admission  PVI ablation 01/08/2019 04/02/2019 EPS/ablation (multiple AFlutter circuits)  Assessment & Plan    1. Persistent AFib, flutter     CHA2DS2Vasc is 2, on Eliquis     K+ 4.2     Mag 2.1     Creat 0.77     QT borderline, EKG was reviewed wby Dr. Rayann Heman, continue 547mcg this AM  Follow QT Anticipate discharge tomorrow afternoon  2. PPM     Stable with device programmed VVI 35 bpm  3. Hypothyroid     Home regime  For questions or updates, please contact [place] Please consult [email or web address] for contact info under        Signed, Baldwin Jamaica, PA-C  05/29/2019, 8:35 AM     I have seen, examined the patient, and reviewed the above assessment and plan.  Changes to above are made where necessary.  On exam, RRR.  conveted to  sinus yesterday with tikosyn.  Qt is stable.  Continue tikosyn loading.  Co Sign: [name], MD 05/29/2019 9:18 PM

## 2019-05-29 NOTE — Progress Notes (Signed)
Post dose EKG is reviewed manually measured QT 440-478ms, QTc 475-547ms Telemetry reviewed   Discussed with Dr. Rayann Heman, will proceed with 558mcg dose tonight  Tommye Standard, PA-C

## 2019-05-30 LAB — BASIC METABOLIC PANEL
Anion gap: 9 (ref 5–15)
BUN: 16 mg/dL (ref 8–23)
CO2: 24 mmol/L (ref 22–32)
Calcium: 9.2 mg/dL (ref 8.9–10.3)
Chloride: 102 mmol/L (ref 98–111)
Creatinine, Ser: 0.8 mg/dL (ref 0.44–1.00)
GFR calc Af Amer: >60 mL/min (ref >60)
GFR calc non Af Amer: >60 mL/min (ref >60)
Glucose, Bld: 96 mg/dL (ref 70–99)
Potassium: 4.2 mmol/L (ref 3.5–5.1)
Sodium: 135 mmol/L (ref 135–145)

## 2019-05-30 LAB — MAGNESIUM: Magnesium: 2.1 mg/dL (ref 1.7–2.4)

## 2019-05-30 MED ORDER — DOFETILIDE 500 MCG PO CAPS: 500.0000 ug | ORAL_CAPSULE | Freq: Two times a day (BID) | ORAL | 6 refills | Status: DC

## 2019-05-30 NOTE — Discharge Instructions (Signed)

## 2019-05-30 NOTE — Progress Notes (Signed)
Discharge instructions & information including but not limited to f/u appointments & medications. All information verified via teachback method & all questions answered. All belongings gathered & sent with pt. Telemetry box off & Iv removed.  Hoover Brunette, RN

## 2019-05-30 NOTE — Plan of Care (Signed)
  Problem: Education: Goal: Knowledge of disease or condition will improve Outcome: Completed/Met Goal: Understanding of medication regimen will improve Outcome: Completed/Met Goal: Individualized Educational Video(s) Outcome: Completed/Met   Problem: Activity: Goal: Ability to tolerate increased activity will improve Outcome: Completed/Met   Problem: Cardiac: Goal: Ability to achieve and maintain adequate cardiopulmonary perfusion will improve Outcome: Completed/Met   Problem: Health Behavior/Discharge Planning: Goal: Ability to safely manage health-related needs after discharge will improve Outcome: Completed/Met

## 2019-05-30 NOTE — Progress Notes (Signed)
Pharmacy: Dofetilide (Tikosyn) - Follow Up Assessment and Electrolyte Replacement  Pharmacy consulted to assist in monitoring and replacing electrolytes in this 69 y.o. female admitted on 05/27/2019 undergoing dofetilide initiation. First dofetilide dose: 500 mcg q 12 hrs  Labs:    Component Value Date/Time   K 4.2 05/30/2019 0332   MG 2.1 05/30/2019 [id number]     Plan: Potassium: K >/= 4: No additional supplementation needed  Magnesium: Mg > 2: No additional supplementation needed   As patient has required 40 mEq of potassium replacement once during admission, could consider discharging patient with prescription for: Potassium chloride 10 mEq  daily   Thank you for allowing pharmacy to participate in this patient's care   Antonietta Jewel, PharmD, [place] Pharmacist  Phone: [phone] 05/30/2019 7:17 AM  Please check AMION for all [place] phone numbers After 10:00 PM, call [place] [phone]

## 2019-05-30 NOTE — Discharge Summary (Addendum)
ELECTROPHYSIOLOGY PROCEDURE DISCHARGE SUMMARY    Patient ID: Kristin [name],  MRN: [id number], DOB/AGE: 1950-07-09 69 y.o.  Admit date: 05/27/2019 Discharge date: 05/30/2019  Primary Care Physician: Leamon Arnt, MD  Electrophysiologist: Dr. Rayann Heman  Primary Discharge Diagnosis:  1.  Persistent atrial fibrillation/flutter status post Tikosyn loading this admission      CHA2DS2Vasc is 2, on Eliquis, appropriately dosed  Secondary Discharge Diagnosis:  1. Hypothyroidism 2. CHB w/PPM  Allergies  Allergen Reactions  . Morphine And Related Hives and Itching  . Neomycin Itching  . Adhesive [Tape] Rash and Other (See Comments)    "Blistering rash"     Procedures This Admission:  1.  Tikosyn loading    Brief HPI: Kristin [name] is a 69 y.o. female with a past medical history as noted above.  She is followed by Dr. Rayann Heman out patient.  Risks, benefits, and alternatives to Tikosyn were reviewed with the patient who wished to proceed.    Hospital Course:  The patient was admitted and Tikosyn was initiated.  Renal function and electrolytes were followed during the hospitalization.  Her QTc remained stable.  She converted with drug and did not required DCCV.    They were monitored until discharge on telemetry which demonstrated SR.  On the day of discharge, she feels well, was were examined by Dr Rayann Heman who considered the patient stable for discharge to home.  Follow-up has been arranged with the AFib clinic in 1 week and with Dr Rayann Heman in June.   Physical Exam: Vitals:   05/29/19 1950 05/30/19 0023 05/30/19 0336 05/30/19 0739  BP:   112/65 108/71  Pulse:  66 65 67  Resp:  16 18   Temp:   (!) 97.4 F (36.3 C) (!) 97.4 F (36.3 C)  TempSrc:   Oral Oral  SpO2:   100% 100%  Weight:   89 kg   Height: 5\' 7"  (1.702 m)       GEN- The patient is well appearing, alert and oriented x 3 today.   HEENT: normocephalic, atraumatic; sclera clear, conjunctiva pink; hearing intact;  oropharynx clear; neck supple, no JVP Lymph- no cervical lymphadenopathy Lungs- CTA B/L, normal work of breathing.  No wheezes, rales, rhonchi Heart- RRR, no murmurs, rubs or gallops GI- soft, non-tender, non-distended Extremities- no clubbing, cyanosis, or edema MS- no significant deformity or atrophy Skin- warm and dry, no rash or lesion Psych- euthymic mood, full affect Neuro- strength and sensation are intact   Labs:   Lab Results  Component Value Date   WBC 9.2 05/13/2019   HGB 15.6 (H) 05/13/2019   HCT 49.5 (H) 05/13/2019   MCV 94.1 05/13/2019   PLT 337 05/13/2019    Recent Labs  Lab 05/30/19 0332  NA 135  K 4.2  CL 102  CO2 24  BUN 16  CREATININE 0.80  CALCIUM 9.2  GLUCOSE 96     Discharge Medications:  Allergies as of 05/30/2019      Reactions   Morphine And Related Hives, Itching   Neomycin Itching   Adhesive [tape] Rash, Other (See Comments)   "Blistering rash"      Medication List    TAKE these medications   acetaminophen 500 MG tablet Commonly known as: TYLENOL Take 1,000 mg by mouth every 6 (six) hours as needed (for headaches or mild pain).   amoxicillin 500 MG capsule Commonly known as: AMOXIL Take 2,000 mg by mouth See admin instructions. Take 4 capsules (2,000  mg) by mouth 1 hour before dental visits   B-12 1000 MCG Subl Place 1,000 mcg under the tongue every Friday.   Benefiber Powd Take by mouth See admin instructions. Mix 1 tablespoonful of powder into 4-8 ounces of water and drink once a day   CALCIUM PO Take 400 mg by mouth daily.   diclofenac sodium 1 % Gel Commonly known as: VOLTAREN Apply 2 g topically 4 (four) times daily as needed. What changed: reasons to take this   diltiazem 120 MG 24 hr capsule Commonly known as: CARDIZEM CD Take 1 capsule (120 mg total) by mouth 2 (two) times daily. May take extra 1 tablet daily for breakthrough afib. What changed:   when to take this  additional instructions   diltiazem 30  MG tablet Commonly known as: Cardizem Take 1 tablet every 4 hours AS NEEDED for AFIB heart rate >100 What changed:   how much to take  how to take this  when to take this  reasons to take this  additional instructions   dofetilide 500 MCG capsule Commonly known as: TIKOSYN Take 1 capsule (500 mcg total) by mouth 2 (two) times daily.   Eliquis 5 MG Tabs tablet Generic drug: apixaban TAKE 1 TABLET BY MOUTH TWICE DAILY. What changed: how much to take   levothyroxine 125 MCG tablet Commonly known as: SYNTHROID TAKE 1 TABLET BY MOUTH DAILY. What changed: when to take this   Magnesium 250 MG Tabs Take 250 mg by mouth every evening.   omeprazole 20 MG capsule Commonly known as: PRILOSEC TAKE (1) CAPSULE DAILY. What changed: See the new instructions.   Potassium 99 MG Tabs Take 99 mg by mouth daily.   sodium chloride 0.65 % Soln nasal spray Commonly known as: OCEAN Place 1 spray into both nostrils as needed for congestion.   traMADol 50 MG tablet Commonly known as: ULTRAM Take 1 tablet (50 mg total) by mouth every 6 (six) hours as needed for moderate pain.   Vitamin D3 25 MCG (1000 UT) Caps Take 1,000 Units by mouth daily.       Disposition: Home Discharge Instructions    Diet - low sodium heart healthy   Complete by: As directed    Increase activity slowly   Complete by: As directed      Follow-up Information    [facility] Follow up.   Specialty: Cardiology Why: 06/06/2019 @ 1:30PM with Maximino Greenland, NP Contact information: [address] [id number] [place] [phone]       [name], MD Follow up.   Specialty: Cardiology Why: 07/17/2019 @ 11:00AM Contact information: [place] [address] [phone]           Duration of Discharge Encounter: Greater than 30 minutes including physician time.  Signed, Tommye Standard, PA-C 05/30/2019 11:52 AM  I  have seen, examined the patient, and reviewed the above assessment and plan.  Changes to above are made where necessary.  On exam, RRR.  Doing well s/p tikosyn load.  I have personally reviewed ekgs and telemetry.  She has had no ectopy.  QT is stable for discharge.  Will need close follow-up in the AF clinic.  Co Sign: [name], MD 05/30/2019 4:54 PM

## 2019-06-06 ENCOUNTER — Other Ambulatory Visit: Payer: Self-pay

## 2019-06-06 ENCOUNTER — Ambulatory Visit (HOSPITAL_COMMUNITY)
Admit: 2019-06-06 | Discharge: 2019-06-06 | Disposition: A | Discharge status: Home / Self Care | Payer: Medicare PPO | Source: Ambulatory Visit | Attending: Nurse Practitioner | Admitting: Nurse Practitioner

## 2019-06-06 ENCOUNTER — Encounter (HOSPITAL_COMMUNITY): Payer: Self-pay | Admitting: Nurse Practitioner

## 2019-06-06 VITALS — BP 100/66 | HR 69 | Ht 67.0 in | Wt 199.6 lb

## 2019-06-06 DIAGNOSIS — Z888 Allergy status to other drugs, medicaments and biological substances status: Secondary | ICD-10-CM | POA: Diagnosis not present

## 2019-06-06 DIAGNOSIS — Z7901 Long term (current) use of anticoagulants: Secondary | ICD-10-CM | POA: Insufficient documentation

## 2019-06-06 DIAGNOSIS — I480 Paroxysmal atrial fibrillation: Secondary | ICD-10-CM | POA: Diagnosis not present

## 2019-06-06 DIAGNOSIS — E890 Postprocedural hypothyroidism: Secondary | ICD-10-CM | POA: Diagnosis not present

## 2019-06-06 DIAGNOSIS — Z885 Allergy status to narcotic agent status: Secondary | ICD-10-CM | POA: Insufficient documentation

## 2019-06-06 DIAGNOSIS — Z8711 Personal history of peptic ulcer disease: Secondary | ICD-10-CM | POA: Diagnosis not present

## 2019-06-06 DIAGNOSIS — Z79890 Hormone replacement therapy (postmenopausal): Secondary | ICD-10-CM | POA: Diagnosis not present

## 2019-06-06 DIAGNOSIS — D6869 Other thrombophilia: Secondary | ICD-10-CM

## 2019-06-06 DIAGNOSIS — Z825 Family history of asthma and other chronic lower respiratory diseases: Secondary | ICD-10-CM | POA: Insufficient documentation

## 2019-06-06 DIAGNOSIS — G4730 Sleep apnea, unspecified: Secondary | ICD-10-CM | POA: Insufficient documentation

## 2019-06-06 DIAGNOSIS — Z96643 Presence of artificial hip joint, bilateral: Secondary | ICD-10-CM | POA: Diagnosis not present

## 2019-06-06 DIAGNOSIS — Z79899 Other long term (current) drug therapy: Secondary | ICD-10-CM | POA: Diagnosis not present

## 2019-06-06 DIAGNOSIS — Z8379 Family history of other diseases of the digestive system: Secondary | ICD-10-CM | POA: Diagnosis not present

## 2019-06-06 DIAGNOSIS — Z8049 Family history of malignant neoplasm of other genital organs: Secondary | ICD-10-CM | POA: Insufficient documentation

## 2019-06-06 DIAGNOSIS — Z950 Presence of cardiac pacemaker: Secondary | ICD-10-CM | POA: Diagnosis not present

## 2019-06-06 DIAGNOSIS — I4891 Unspecified atrial fibrillation: Secondary | ICD-10-CM | POA: Diagnosis present

## 2019-06-06 DIAGNOSIS — K219 Gastro-esophageal reflux disease without esophagitis: Secondary | ICD-10-CM | POA: Insufficient documentation

## 2019-06-06 DIAGNOSIS — Z833 Family history of diabetes mellitus: Secondary | ICD-10-CM | POA: Insufficient documentation

## 2019-06-06 DIAGNOSIS — I4892 Unspecified atrial flutter: Secondary | ICD-10-CM | POA: Insufficient documentation

## 2019-06-06 LAB — MAGNESIUM: Magnesium: 2 mg/dL (ref 1.7–2.4)

## 2019-06-06 LAB — BASIC METABOLIC PANEL
Anion gap: 12 (ref 5–15)
BUN: 15 mg/dL (ref 8–23)
CO2: 25 mmol/L (ref 22–32)
Calcium: 9.7 mg/dL (ref 8.9–10.3)
Chloride: 102 mmol/L (ref 98–111)
Creatinine, Ser: 0.89 mg/dL (ref 0.44–1.00)
GFR calc Af Amer: >60 mL/min (ref >60)
GFR calc non Af Amer: >60 mL/min (ref >60)
Glucose, Bld: 100 mg/dL — ABNORMAL HIGH (ref 70–99)
Potassium: 4.6 mmol/L (ref 3.5–5.1)
Sodium: 139 mmol/L (ref 135–145)

## 2019-06-06 NOTE — Progress Notes (Signed)
Primary Care Physician: Leamon Arnt, MD Referring Physician: Dr. Chancy Hurter is a 69 y.o. female with a h/o paroxysmal afib that is in the afib clinic at pt's request as she went out of rhythm yesterday am. She  had an ablation 12/15. She  denies any shortness of breath, no extra  fluid. No triggers that she is aware of. She has taken extra 30 mg Cardizem yesterday and an extra 120 mg Cardizem this am but she persists in atrial flutter. This does not feel as uncomfortable to her that the afib did prior to ablation. She  continues on eliquis 5 mg bid for a CHA2DS2VASc score of at least 2.  She continues on her flecainide 50 mg bid. No swallowing or groin issues. EKG shows atrial flutter at 118 bpm.   Returns to clinic 12/31. 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 Marland Kitchen   Has not missed any anticoagulation x for at least 3 weeks.   F/u in afib clinic, 02/05/19. She had successful cardioversion and is staying in SR. She feels improved.   F/u in afib clinic, 2/1. She  went out of rhythm last Friday. Ablation 01/08/19. Extra Cardizem did not convert her. She  feels presyncopal with walking. Her v rate is 108 bpm today with a BP of 112/70. Last DCCV lasted almost one month. She  does not want to go to the ER for urgent cardioversion and  at this point does not  want to change antiarrythmic's. No  missed anticoagulation for CHA2DS2VASc score of 2.  F/u in afib clinic, 03/06/19. She had successful cardioversion 02/27/19. She is in SR today.   F/u in afib clinic 04/30/19. She is now one month out from  her second ablation. She had a typical atrial flutter ablation and some atypical flutter  circuits were found and ablated as well although there were a few atypical flutter circuits that could not be treated as too close to phrenic nerve. She has enjoyed an arrhythmia free period  since 2nd ablation. She is feeling improved.  F/u in afib clinic, 4/26 for a ER visit last week for a 1:1 atrial flutter . She  was cardioverted in the  ER by Dr. Lovena Le. I discussed with Dr. Rayann Heman and he feels that a change in antiarrythmic is needed to tikosyn. Discussed with pt today and she is agreeable to this. No further arrhythmia since cardioversion.   F/u in clinic for [place] admission, 5/3.She  has been off flecainide since Thursday PM and went out of rhythm Saturday  am. Has not noted very rapid v rates that she has had in the past around 200 bpm. She is in flutter today at 125 bpm and tolerating ok. . No missed anticoagulation, can afford dofetilide at $ 10 a month.  F/u in afib clinic 06/06/19. F/u Tikosyn load. She  is in SR. She has not noted any afib. Continues on dofetilide 500 mcg bid. Qtc is stable.   Today, she denies symptoms of palpitations, chest pain, shortness of breath, orthopnea, PND, lower extremity edema, dizziness, presyncope, syncope, or neurologic sequela. The patient is tolerating medications without difficulties and is otherwise without complaint today.   Past Medical History:  Diagnosis Date  . AR (allergic rhinitis)   . Atrial fibrillation ([place])   . Clotting disorder ([place])   .  Diverticulosis   . GERD (gastroesophageal reflux disease)   . Hiatal hernia   . Hiatal hernia   . Hypothyroidism   . Migraines   . Mild sleep apnea   . Osteoarthritis   . Osteopenia after menopause 06/14/2017   DEXA 08/2016 [facility]: T = -1.10 radius, -1.0 femur; lumbar spine elevated due to DJD; recheck 2-3 years.  . Pacemaker    CHB  . Paroxysmal A-fib ([place])   . PUD (peptic ulcer disease)   . Sleep apnea   . Thyroid nodule   . Transient complete heart block 1996   Past Surgical History:  Procedure Laterality Date  . ATRIAL FIBRILLATION ABLATION N/A 01/08/2019   Procedure: ATRIAL FIBRILLATION ABLATION;  Surgeon: [name], MD;  Location: [facility] CV LAB;  Service:  Cardiovascular;  Laterality: N/A;  . ATRIAL FIBRILLATION ABLATION N/A 04/02/2019   Procedure: ATRIAL FIBRILLATION ABLATION;  Surgeon: [name], MD;  Location: [facility] LAB;  Service: Cardiovascular;  Laterality: N/A;  . CARDIOVERSION N/A 01/28/2019   Procedure: CARDIOVERSION;  Surgeon: Donato Heinz, MD;  Location[facility] ENDOSCOPY;  Service: Endoscopy;  Laterality: N/A;  . CARDIOVERSION N/A 02/27/2019   Procedure: CARDIOVERSION;  Surgeon: Donato Heinz, MD;  Location: [facility] ENDOSCOPY;  Service: Endoscopy;  Laterality: N/A;  . HIP ARTHROPLASTY Right 2008  . PACEMAKER GENERATOR CHANGE  03/01/2007   performed [facility] Dr Rosita [facility])  . PACEMAKER INSERTION  09/08/1994   performed in [facility] for transient complete heart block  . THYROIDECTOMY, PARTIAL  1986   BENIGN NODULES  . TOTAL HIP ARTHROPLASTY Left 02/13/2018   Procedure: TOTAL HIP ARTHROPLASTY ANTERIOR APPROACH;  Surgeon: Paralee Cancel, MD;  Location: [facility] ORS;  Service: Orthopedics;  Laterality: Left;  70 mins  . TUBAL LIGATION  1987    Current Outpatient Medications  Medication Sig Dispense Refill  . acetaminophen (TYLENOL) 500 MG tablet Take 1,000 mg by mouth every 6 (six) hours as needed (for headaches or mild pain).    Marland Kitchen amoxicillin (AMOXIL) 500 MG capsule Take 2,000 mg by mouth See admin instructions. Take 4 capsules (2,000 mg) by mouth 1 hour before dental visits    . CALCIUM PO Take 400 mg by mouth daily.    . Cholecalciferol (VITAMIN D3) 25 MCG (1000 UT) CAPS Take 1,000 Units by mouth daily.    . Cyanocobalamin (B-12) 1000 MCG SUBL Place 1,000 mcg under the tongue every Friday.     . diclofenac sodium (VOLTAREN) 1 % GEL Apply 2 g topically 4 (four) times daily as needed. (Patient taking differently: Apply 2 g topically 4 (four) times daily as needed (to painful sites). ) 100 g 5  . diltiazem (CARDIZEM CD) 120 MG 24 hr capsule Take 1 capsule (120 mg total) by mouth 2 (two) times daily. May  take extra 1 tablet daily for breakthrough afib. (Patient taking differently: Take 120 mg by mouth See admin instructions. Take 120 mg by mouth two times a day and an additional 120 mg daily as needed for "breakthrough A-Fib") 190 capsule 2  . diltiazem (CARDIZEM) 30 MG tablet Take 1 tablet every 4 hours AS NEEDED for AFIB heart rate >100 (Patient taking differently: Take 30 mg by mouth every 4 (four) hours as needed (for AFIB heart rate >100). ) 45 tablet 3  . dofetilide (TIKOSYN) 500 MCG capsule Take 1 capsule (500 mcg total) by mouth 2 (two) times daily. 60 capsule 6  . ELIQUIS 5 MG TABS tablet TAKE 1  TABLET BY MOUTH TWICE DAILY. (Patient taking differently: Take 5 mg by mouth 2 (two) times daily. ) 180 tablet 1  . levothyroxine (SYNTHROID) 125 MCG tablet TAKE 1 TABLET BY MOUTH DAILY. (Patient taking differently: Take 125 mcg by mouth daily before breakfast. ) 90 tablet 0  . Magnesium 250 MG TABS Take 250 mg by mouth every evening.     Marland Kitchen omeprazole (PRILOSEC) 20 MG capsule TAKE (1) CAPSULE DAILY. (Patient taking differently: Take 20 mg by mouth daily before breakfast. ) 90 capsule 3  . Potassium 99 MG TABS Take 99 mg by mouth daily.     . sodium chloride (OCEAN) 0.65 % SOLN nasal spray Place 1 spray into both nostrils as needed for congestion.     . Wheat Dextrin (BENEFIBER) POWD Take by mouth See admin instructions. Mix 1 tablespoonful of powder into 4-8 ounces of water and drink once a day    . traMADol (ULTRAM) 50 MG tablet Take 1 tablet (50 mg total) by mouth every 6 (six) hours as needed for moderate pain. (Patient not taking: Reported on 05/27/2019) 30 tablet 0   No current facility-administered medications for this encounter.    Allergies  Allergen Reactions  . Morphine And Related Hives and Itching  . Neomycin Itching  . Adhesive [Tape] Rash and Other (See Comments)    "Blistering rash"    Social History   Socioeconomic History  . Marital status: Married    Spouse name: Not on  file  . Number of children: 0  . Years of education: Not on file  . Highest education level: Not on file  Occupational History  . Occupation: retired  Tobacco Use  . Smoking status: Never Smoker  . Smokeless tobacco: Never Used  Substance and Sexual Activity  . Alcohol use: Not Currently    Alcohol/week: 1.0 standard drinks    Types: 1 Glasses of wine per week    Comment: occasionally  . Drug use: No  . Sexual activity: Not Currently  Other Topics Concern  . 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 Difficulty of Paying Living Expenses:   Food Insecurity:   . Worried About Charity fundraiser in the Last Year:   . Arboriculturist in the Last Year:   Transportation Needs:   . Film/video editor (Medical):   Marland Kitchen Lack of Transportation (Non-Medical):   Physical Activity:   . Days of Exercise per Week:   . Minutes of Exercise per Session:   Stress:   . Feeling of Stress :   Social Connections:   . Frequency of Communication with Friends and Family:   . Frequency of Social Gatherings with Friends and Family:   . Attends Religious Services:   . Active Member of Clubs or Organizations:   . Attends Archivist Meetings:   Marland Kitchen Marital Status:   Intimate Partner Violence:   . Fear of Current or Ex-Partner:   . Emotionally Abused:   Marland Kitchen Physically Abused:   . Sexually Abused:     Family History  Problem Relation Age of Onset  . Diabetes Mellitus II Maternal Uncle   . Cancer Maternal Grandmother        ENDOMETRIAL  . Rheum arthritis Mother   . Peptic Ulcer Mother   . COPD Brother   .  Drug abuse Brother   . Breast cancer Neg Hx     ROS- All systems are reviewed and negative except as per the HPI above  Physical Exam: Vitals:   06/06/19 1333  BP: 100/66  Pulse: 69  Weight: 90.5 kg  Height: 5\' 7"  (1.702 m)   Wt  Readings from Last 3 Encounters:  06/06/19 90.5 kg  05/30/19 89 kg  05/27/19 89.1 kg    Labs: Lab Results  Component Value Date   NA 135 05/30/2019   K 4.2 05/30/2019   CL 102 05/30/2019   CO2 24 05/30/2019   GLUCOSE 96 05/30/2019   BUN 16 05/30/2019   CREATININE 0.80 05/30/2019   CALCIUM 9.2 05/30/2019   PHOS 4.0 05/13/2019   MG 2.1 05/30/2019   Lab Results  Component Value Date   INR 0.87 02/13/2018   Lab Results  Component Value Date   CHOL 187 10/08/2018   HDL 73.50 10/08/2018   [place] 95 10/08/2018   TRIG 93.0 10/08/2018     GEN- The patient is well appearing, alert and oriented x 3 today.   Head- normocephalic, atraumatic Eyes-  Sclera clear, conjunctiva pink Ears- hearing intact Oropharynx- clear Neck- supple, no JVP Lymph- no cervical lymphadenopathy Lungs- Clear to ausculation bilaterally, normal work of breathing Heart- regular rate and rhythm, no murmurs, rubs or gallops, PMI not laterally displaced GI- soft, NT, ND, + BS Extremities- no clubbing, cyanosis, or edema MS- no significant deformity or atrophy Skin- no rash or lesion Psych- euthymic mood, full affect Neuro- strength and sensation are intact  EKG-  Normal sinus rhythm at 69 bpm, pr int 152 ms, qrs int 104 ms, qtc 447 ms (stable)   Epic records reviewed.    Assessment and Plan: 1. afib S/p ablation 01/08/19 Persistent  atrial flutter occurred several weeks out from ablation and received  successful cardioversion 01/28/19. Then had typical flutter with RVR, successful cardioversion 02/27/19.  Then had recurrence of arrhythmia, went back for EP study with ablation of typical and atypical flutter, had a few atypical atrial flutters that could not be ablated due to vicinity of phrenic nerve  Maintaining  SR until 4/19 when presenting with a 1:1 atrial flutter, at 200 bpm, to ER with successful cardioversion. Admitted 5/3 for successful Tikosyn admit  In SR today with stable qtc  Tikosyn  guidelines reviewed  Bmet/mag   2. CHA2DS2VASc score of 2 Continue eliquis 5 mg bid   F/u with Dr. Rayann Heman 6/23  Geroge Baseman. Kajsa Butrum, [facility] [address] [place], [place] [address] [phone]

## 2019-06-11 ENCOUNTER — Ambulatory Visit (INDEPENDENT_AMBULATORY_CARE_PROVIDER_SITE_OTHER): Payer: Medicare PPO | Admitting: *Deleted

## 2019-06-11 DIAGNOSIS — I442 Atrioventricular block, complete: Secondary | ICD-10-CM | POA: Diagnosis not present

## 2019-06-11 DIAGNOSIS — I480 Paroxysmal atrial fibrillation: Secondary | ICD-10-CM

## 2019-06-11 LAB — CUP PACEART REMOTE DEVICE CHECK
Battery Impedance: 5347 Ohm
Battery Remaining Longevity: 13 mo
Battery Voltage: 2.67 V
Brady Statistic RV Percent Paced: 0 %
Date Time Interrogation Session: 20210518121326
Implantable Lead Implant Date: 19960815
Implantable Lead Implant Date: 19960815
Implantable Lead Location: 753859
Implantable Lead Location: 753860
Implantable Lead Model: 5024
Implantable Pulse Generator Implant Date: 20090205
Lead Channel Impedance Value: 67 Ohm
Lead Channel Impedance Value: 768 Ohm
Lead Channel Pacing Threshold Amplitude: 1.25 V
Lead Channel Pacing Threshold Pulse Width: 0.4 ms
Lead Channel Setting Pacing Amplitude: 2.5 V
Lead Channel Setting Pacing Pulse Width: 0.4 ms
Lead Channel Setting Sensing Sensitivity: 2.8 mV

## 2019-06-13 NOTE — Progress Notes (Signed)
Remote pacemaker transmission.   

## 2019-06-14 ENCOUNTER — Encounter: Payer: Self-pay | Admitting: Gastroenterology

## 2019-06-21 ENCOUNTER — Other Ambulatory Visit: Payer: Self-pay | Admitting: Family Medicine

## 2019-06-21 DIAGNOSIS — Z1231 Encounter for screening mammogram for malignant neoplasm of breast: Secondary | ICD-10-CM

## 2019-06-25 ENCOUNTER — Other Ambulatory Visit: Payer: Self-pay | Admitting: Family Medicine

## 2019-06-25 ENCOUNTER — Other Ambulatory Visit: Payer: Self-pay | Admitting: Internal Medicine

## 2019-06-25 NOTE — Telephone Encounter (Signed)
Eliquis 5mg  refill request received. Patient is 69 years old, weight-90.5kg, Crea-0.89 on 06/06/2019, Diagnosis-Afib, and last seen by Roderic Palau on 06/06/2019. Dose is appropriate based on dosing criteria. Will send in refill to requested pharmacy.

## 2019-07-17 ENCOUNTER — Ambulatory Visit: Payer: Medicare PPO | Admitting: Internal Medicine

## 2019-07-17 ENCOUNTER — Encounter: Payer: Self-pay | Admitting: Internal Medicine

## 2019-07-17 ENCOUNTER — Other Ambulatory Visit: Payer: Self-pay

## 2019-07-17 VITALS — BP 118/76 | HR 79 | Ht 67.0 in | Wt 204.0 lb

## 2019-07-17 DIAGNOSIS — I480 Paroxysmal atrial fibrillation: Secondary | ICD-10-CM

## 2019-07-17 DIAGNOSIS — I442 Atrioventricular block, complete: Secondary | ICD-10-CM

## 2019-07-17 DIAGNOSIS — Z950 Presence of cardiac pacemaker: Secondary | ICD-10-CM | POA: Diagnosis not present

## 2019-07-17 LAB — CUP PACEART INCLINIC DEVICE CHECK
Battery Impedance: 5315 Ohm
Battery Remaining Longevity: 13 mo
Battery Voltage: 2.68 V
Brady Statistic RV Percent Paced: 0 %
Date Time Interrogation Session: 20210623112600
Implantable Lead Implant Date: 19960815
Implantable Lead Implant Date: 19960815
Implantable Lead Location: 753859
Implantable Lead Location: 753860
Implantable Lead Model: 5024
Implantable Pulse Generator Implant Date: 20090205
Lead Channel Impedance Value: 67 Ohm
Lead Channel Impedance Value: 771 Ohm
Lead Channel Pacing Threshold Amplitude: 1.25 V
Lead Channel Pacing Threshold Pulse Width: 0.4 ms
Lead Channel Sensing Intrinsic Amplitude: 8 mV
Lead Channel Setting Pacing Amplitude: 2.5 V
Lead Channel Setting Pacing Pulse Width: 0.4 ms
Lead Channel Setting Sensing Sensitivity: 2.8 mV

## 2019-07-17 NOTE — Progress Notes (Signed)
PCP: Leamon Arnt, MD    Kristin [name] is a 69 y.o. female who presents today for routine electrophysiology followup.  Since his recent afib ablation, the patient reports doing very well.  she denies procedure related complications and is pleased with the results of the procedure.  She has had ERAF.  Today, she denies symptoms of  chest pain, shortness of breath,  lower extremity edema, dizziness, presyncope, or syncope.  The patient is otherwise without complaint today.   Past Medical History:  Diagnosis Date  . AR (allergic rhinitis)   . Atrial fibrillation ([place])   . Clotting disorder ([place])   . Diverticulosis   . GERD (gastroesophageal reflux disease)   . Hiatal hernia   . Hiatal hernia   . Hypothyroidism   . Migraines   . Mild sleep apnea   . Osteoarthritis   . Osteopenia after menopause 06/14/2017   DEXA 08/2016 [facility]: T = -1.10 radius, -1.0 femur; lumbar spine elevated due to DJD; recheck 2-3 years.  . Pacemaker    CHB  . Paroxysmal A-fib ([place])   . PUD (peptic ulcer disease)   . Sleep apnea   . Thyroid nodule   . Transient complete heart block 1996   Past Surgical History:  Procedure Laterality Date  . ATRIAL FIBRILLATION ABLATION N/A 01/08/2019   Procedure: ATRIAL FIBRILLATION ABLATION;  Surgeon: [name], MD;  Location: [place] [facility];  Service: Cardiovascular;  Laterality: N/A;  . ATRIAL FIBRILLATION ABLATION N/A 04/02/2019   Procedure: ATRIAL FIBRILLATION ABLATION;  Surgeon: [name], MD;  Location: [place] [facility] LAB;  Service: Cardiovascular;  Laterality: N/A;  . CARDIOVERSION N/A 01/28/2019   Procedure: CARDIOVERSION;  Surgeon: Donato Heinz, MD;  Location: [facility] ENDOSCOPY;  Service: Endoscopy;  Laterality: N/A;  . CARDIOVERSION N/A 02/27/2019   Procedure: CARDIOVERSION;  Surgeon: Donato Heinz, MD;  Location: [facility] ENDOSCOPY;  Service: Endoscopy;  Laterality: N/A;  . HIP ARTHROPLASTY Right 2008  . PACEMAKER GENERATOR CHANGE  03/01/2007    performed by Dr Rosita Fire at [facility])  . PACEMAKER INSERTION  09/08/1994   performed in [facility] for transient complete heart block  . THYROIDECTOMY, PARTIAL  1986   BENIGN NODULES  . TOTAL HIP ARTHROPLASTY Left 02/13/2018   Procedure: TOTAL HIP ARTHROPLASTY ANTERIOR APPROACH;  Surgeon: Paralee Cancel, MD;  Location: [facility];  Service: Orthopedics;  Laterality: Left;  70 mins  . TUBAL LIGATION  1987    ROS- all systems are personally reviewed and negatives except as per HPI above  Current Outpatient Medications  Medication Sig Dispense Refill  . acetaminophen (TYLENOL) 500 MG tablet Take 1,000 mg by mouth every 6 (six) hours as needed (for headaches or mild pain).    Marland Kitchen amoxicillin (AMOXIL) 500 MG capsule Take 2,000 mg by mouth See admin instructions. Take 4 capsules (2,000 mg) by mouth 1 hour before dental visits    . CALCIUM PO Take 400 mg by mouth daily.    . Cholecalciferol (VITAMIN D3) 25 MCG (1000 UT) CAPS Take 1,000 Units by mouth daily.    . Cyanocobalamin (B-12) 1000 MCG SUBL Place 1,000 mcg under the tongue every Friday.     . diclofenac sodium (VOLTAREN) 1 % GEL Apply 2 g topically 4 (four) times daily as needed. 100 g 5  . diltiazem (CARDIZEM CD) 120 MG 24 hr capsule Take 1 capsule (120 mg total) by mouth 2 (two) times daily. May take extra 1 tablet daily for breakthrough afib.  190 capsule 2  . diltiazem (CARDIZEM) 30 MG tablet Take 1 tablet every 4 hours AS NEEDED for AFIB heart rate >100 45 tablet 3  . dofetilide (TIKOSYN) 500 MCG capsule Take 1 capsule (500 mcg total) by mouth 2 (two) times daily. 60 capsule 6  . ELIQUIS 5 MG TABS tablet TAKE 1 TABLET BY MOUTH TWICE DAILY. 180 tablet 1  . levothyroxine (SYNTHROID) 125 MCG tablet TAKE 1 TABLET BY MOUTH DAILY. 90 tablet 0  . Magnesium 250 MG TABS Take 250 mg by mouth every evening.     Marland Kitchen omeprazole (PRILOSEC) 20 MG capsule TAKE (1) CAPSULE DAILY. 90 capsule 3  . Potassium 99 MG TABS Take 99 mg by mouth daily.      . sodium chloride (OCEAN) 0.65 % SOLN nasal spray Place 1 spray into both nostrils as needed for congestion.     . traMADol (ULTRAM) 50 MG tablet Take 1 tablet (50 mg total) by mouth every 6 (six) hours as needed for moderate pain. 30 tablet 0  . Wheat Dextrin (BENEFIBER) POWD Take by mouth See admin instructions. Mix 1 tablespoonful of powder into 4-8 ounces of water and drink once a day     No current facility-administered medications for this visit.    Physical Exam: Vitals:   07/17/19 1123  BP: 118/76  Pulse: 79  SpO2: 97%  Weight: 204 lb (92.5 kg)  Height: 5\' 7"  (1.702 m)    GEN- The patient is well appearing, alert and oriented x 3 today.   Head- normocephalic, atraumatic Eyes-  Sclera clear, conjunctiva pink Ears- hearing intact Oropharynx- clear Lungs-   normal work of breathing Heart- Regular rate and rhythm  GI- sof  Extremities- no clubbing, cyanosis, or edema  EKG tracing ordered today is personally reviewed and shows sinus rhythm, Qtc 470 msec  Assessment and Plan:  1. Persistent atrial fibrillation with both typical and atypical atrial flutter circuits ablated Doing well s/p ablation chads2vasc score is 2 She is on eliquis Doing well with tikosyn Bmet, mg from 06/06/19 reviewed We will need to follow closely to avoid toxicity with this medicine  2. Remote transient complete heart block Remote 06/11/19 is up to date 13 months longevity remains She may opt to not have her device replaced once ERI (she paces 0%) Device interrogation today reveals V rates have been improved since April, suggesting less afib  3. Severe RA enlargment We discussed weight loss Her prior sleep apnea was mild Consider echo on return to further evaluate TR/ RV function We could consider further workup pending results of follow-up echo  Return to see me in 3 months  [name] MD, [facility] 07/17/2019 11:30 AM

## 2019-07-17 NOTE — Patient Instructions (Addendum)
Medication Instructions:  Your physician recommends that you continue on your current medications as directed. Please refer to the Current Medication list given to you today.  Labwork: None ordered.  Testing/Procedures: None ordered.  Follow-Up: Your physician wants you to follow-up in: 3 months with Dr. Rayann Heman.     October 16, 2019 at 12:15 pm at the [facility] office-in person  Remote monitoring is used to monitor your Pacemaker from home. This monitoring reduces the number of office visits required to check your device to one time per year. It allows [place] to keep an eye on the functioning of your device to ensure it is working properly. You are scheduled for a device check from home on 09/10/2019. You may send your transmission at any time that day. If you have a wireless device, the transmission will be sent automatically. After your physician reviews your transmission, you will receive a postcard with your next transmission date.   Any Other Special Instructions Will Be Listed Below (If Applicable).  If you need a refill on your cardiac medications before your next appointment, please call your pharmacy.

## 2019-08-13 ENCOUNTER — Ambulatory Visit: Payer: Medicare PPO | Admitting: Gastroenterology

## 2019-08-13 ENCOUNTER — Encounter: Payer: Self-pay | Admitting: Gastroenterology

## 2019-08-13 VITALS — BP 110/64 | HR 76 | Ht 67.0 in | Wt 210.2 lb

## 2019-08-13 DIAGNOSIS — K219 Gastro-esophageal reflux disease without esophagitis: Secondary | ICD-10-CM | POA: Diagnosis not present

## 2019-08-13 DIAGNOSIS — R1012 Left upper quadrant pain: Secondary | ICD-10-CM | POA: Diagnosis not present

## 2019-08-13 NOTE — Patient Instructions (Signed)
If you are age 68 or older, your body mass index should be between 23-30. Your Body mass index is 32.93 kg/m. If this is out of the aforementioned range listed, please consider follow up with your Primary Care Provider.  If you are age 43 or younger, your body mass index should be between 19-25. Your Body mass index is 32.93 kg/m. If this is out of the aformentioned range listed, please consider follow up with your Primary Care Provider.  You can discontinue your omeprazole.  You will follow up with our office on an as needed basis or if symptoms worsen or fail to improve.  Thank you for entrusting me with your care and choosing [facility].  Dr Ardis Hughs

## 2019-08-13 NOTE — Progress Notes (Signed)
CT scan abdomen pelvis with IV and oral contrast October 2020 indication "left-sided abdominal pain for several months"; findings "sigmoid diverticulosis.  No radiographic evidence of diverticulitis or other acute findings"  EGD December 2017 Dr. Alice Reichert in [place].  Indication "dysphagia, peptic stricture, chronic gastric ulcer" findings normal esophagus, normal duodenum, polyp in the gastric body, polyps in the cardia and fundus.  Biopsied.  I do not have the pathology results at the time of this visit.  A letter to the patient was written with regard to the biopsies and it said that the "small polyp we biopsied was not cancerous, as I had expected.  Good news."  Colonoscopy December 2017 Dr. Alice Reichert in [place], indication "screening" findings "grade 2 internal hemorrhoids, moderate diverticulosis of the sigmoid colon"  EGD December 2007 Dr. Penelope Coop at [facility] gastroenterology indication "heartburn, dysphagia".  Findings LA grade B esophagitis.  Biopsies were taken.  Mild stenosis was found 34 cm from the incisors.  Hiatal hernia was noted.  She was recommended be on proton pump inhibitor twice daily.  Biopsies showed no H. pylori,, GE junction biopsies showed "ulceration and marked squamous atypia"  Colonoscopy December 2007 Dr. Lahoma Rocker gastroenterology, indication average risk screening" nodular mucosa was seen at the base of the cecum these were biopsied.  Pathology report suggested "ulcerated colonic mucosa" comment section showed that this was "strongly suggestive of ischemia" Follow-up office note with Dr. Penelope Coop at [facility] gastroenterology January 2008 he felt that the area at her cecum was probably related to a lot of Motrin which she had been taking around that time.  Blood work September 2020 shows normal CBC, normal complete metabolic profile, normal pancreatic enzymes, normal sedimentation rate.  EGD December 2020 Dr. Silverio Decamp: Described small hiatal hernia, fundic gland polyps.   The examination was otherwise completely normal.  She was given a trial of dicyclomine 10 mg 3 times daily as needed for abdominal pains.  She was instructed to follow-up in 2 or 3 months.     HPI: This is a very pleasant 69 year old woman  Her weight is up 6 pounds since her last office visit here 7 months ago, same scale in our office  She never really tried the dicyclomine that was recommended after her EGD several months ago.  She explained that she started having a lot of troubles with atrial fibrillation and that dominated her health care efforts for quite some time.  She stopped taking Benefiber several months ago and since then she has noticed an improvement in her left upper quadrant discomfort.  She was taking it not to alter her bowels but just because she was told she had diverticulosis.  Since stopping the Benefiber she has actually noticed her bowels are better, less bulky and hard.  She asked about the fundic gland polyps noted in her stomach.  She has been trying to wean herself off of omeprazole and is down to every other day 20 mg without any troubles.   ROS: complete GI ROS as described in HPI, all other review negative.  Constitutional:  No unintentional weight loss   Past Medical History:  Diagnosis Date  . AR (allergic rhinitis)   . Atrial fibrillation ([place])   . Atrial flutter ([place])   . Clotting disorder ([place])   . Diverticulosis   . GERD (gastroesophageal reflux disease)   . Hiatal hernia   . Hiatal hernia   . Hypothyroidism   . Migraines   . Mild sleep apnea   . Osteoarthritis   .  Osteopenia after menopause 06/14/2017   DEXA 08/2016 [facility]: T = -1.10 radius, -1.0 femur; lumbar spine elevated due to DJD; recheck 2-3 years.  . Pacemaker    CHB  . Paroxysmal A-fib ([place])   . PUD (peptic ulcer disease)   . Sleep apnea   . Thyroid nodule   . Transient complete heart block 1996    Past Surgical History:  Procedure Laterality Date  . ATRIAL FIBRILLATION  ABLATION N/A 01/08/2019   Procedure: ATRIAL FIBRILLATION ABLATION;  Surgeon: [name], MD;  Location: [place] CV LAB;  Service: Cardiovascular;  Laterality: N/A;  . ATRIAL FIBRILLATION ABLATION N/A 04/02/2019   Procedure: ATRIAL FIBRILLATION ABLATION;  Surgeon: [name], MD;  Location: [place] CV LAB;  Service: Cardiovascular;  Laterality: N/A;  . CARDIOVERSION N/A 01/28/2019   Procedure: CARDIOVERSION;  Surgeon: Donato Heinz, MD;  Location: [facility] ENDOSCOPY;  Service: Endoscopy;  Laterality: N/A;  . CARDIOVERSION N/A 02/27/2019   Procedure: CARDIOVERSION;  Surgeon: Donato Heinz, MD;  Location: [facility] ENDOSCOPY;  Service: Endoscopy;  Laterality: N/A;  . HIP ARTHROPLASTY Right 2008  . PACEMAKER GENERATOR CHANGE  03/01/2007   performed by Dr Rosita Fire at [facility])  . PACEMAKER INSERTION  09/08/1994   performed in [facility] for transient complete heart block  . THYROIDECTOMY, PARTIAL  1986   BENIGN NODULES  . TOTAL HIP ARTHROPLASTY Left 02/13/2018   Procedure: TOTAL HIP ARTHROPLASTY ANTERIOR APPROACH;  Surgeon: Paralee Cancel, MD;  [facility]: [facility];  Service: Orthopedics;  Laterality: Left;  70 mins  . TUBAL LIGATION  1987    Current Outpatient Medications  Medication Sig Dispense Refill  . acetaminophen (TYLENOL) 500 MG tablet Take 1,000 mg by mouth every 6 (six) hours as needed (for headaches or mild pain).    . CALCIUM PO Take 400 mg by mouth daily.    . Cholecalciferol (VITAMIN D3) 25 MCG (1000 UT) CAPS Take 1,000 Units by mouth daily.    . Cyanocobalamin (B-12) 1000 MCG SUBL Place 1,000 mcg under the tongue every Friday.     . diclofenac sodium (VOLTAREN) 1 % GEL Apply 2 g topically 4 (four) times daily as needed. 100 g 5  . diltiazem (CARDIZEM CD) 120 MG 24 hr capsule Take 1 capsule (120 mg total) by mouth 2 (two) times daily. May take extra 1 tablet daily for breakthrough afib. 190 capsule 2  . diltiazem (CARDIZEM) 30 MG tablet Take 1 tablet  every 4 hours AS NEEDED for AFIB heart rate >100 45 tablet 3  . dofetilide (TIKOSYN) 500 MCG capsule Take 1 capsule (500 mcg total) by mouth 2 (two) times daily. 60 capsule 6  . ELIQUIS 5 MG TABS tablet TAKE 1 TABLET BY MOUTH TWICE DAILY. 180 tablet 1  . levothyroxine (SYNTHROID) 125 MCG tablet TAKE 1 TABLET BY MOUTH DAILY. 90 tablet 0  . Magnesium 250 MG TABS Take 250 mg by mouth every evening.     Kristin [name] omeprazole (PRILOSEC) 20 MG capsule TAKE (1) CAPSULE DAILY. (Patient taking differently: Take 20 mg by mouth every other day. ) 90 capsule 3  . Potassium 99 MG TABS Take 99 mg by mouth daily.     . sodium chloride (OCEAN) 0.65 % SOLN nasal spray Place 1 spray into both nostrils as needed for congestion.     . traMADol (ULTRAM) 50 MG tablet Take 1 tablet (50 mg total) by mouth every 6 (six) hours as needed for moderate pain. 30 tablet 0  . amoxicillin (AMOXIL)  500 MG capsule Take 2,000 mg by mouth See admin instructions. Take 4 capsules (2,000 mg) by mouth 1 hour before dental visits (Patient not taking: Reported on 08/13/2019)    . Wheat Dextrin (BENEFIBER) POWD Take by mouth See admin instructions. Mix 1 tablespoonful of powder into 4-8 ounces of water and drink once a day (Patient not taking: Reported on 08/13/2019)     No current facility-administered medications for this visit.    Allergies as of 08/13/2019 - Review Complete 08/13/2019  Allergen Reaction Noted  . Morphine and related Hives and Itching 01/08/2013  . Neomycin Itching 01/08/2013  . Adhesive [tape] Rash and Other (See Comments) 10/06/2015    Family History  Problem Relation Age of Onset  . Diabetes Mellitus II Maternal Uncle   . Cancer Maternal Grandmother        ENDOMETRIAL  . Rheum arthritis Mother   . Peptic Ulcer Mother   . COPD Brother   . Drug abuse Brother   . Breast cancer Neg Hx     Social History   Socioeconomic History  . Marital status: Married    Spouse name: Not on file  . Number of children: 0  .  Years of education: Not on file  . Highest education level: Not on file  Occupational History  . Occupation: retired  Tobacco Use  . Smoking status: Never Smoker  . Smokeless tobacco: Never Used  Vaping Use  . Vaping Use: Never used  Substance and Sexual Activity  . Alcohol use: Not Currently    Alcohol/week: 1.0 standard drink    Types: 1 Glasses of wine per week    Comment: occasionally  . Drug use: No  . Sexual activity: Not Currently  Other Topics Concern  . 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 Difficulty of Paying Living Expenses:   Food Insecurity:   . Worried About Charity fundraiser in the Last Year:   . Arboriculturist in the Last Year:   Transportation Needs:   . Film/video editor (Medical):   Kristin [name] Lack of Transportation (Non-Medical):   Physical Activity:   . Days of Exercise per Week:   . Minutes of Exercise per Session:   Stress:   . Feeling of Stress :   Social Connections:   . Frequency of Communication with Friends and Family:   . Frequency of Social Gatherings with Friends and Family:   . Attends Religious Services:   . Active Member of Clubs or Organizations:   . Attends Archivist Meetings:   Kristin [name] Marital Status:   Intimate Partner Violence:   . Fear of Current or Ex-Partner:   . Emotionally Abused:   Kristin [name] Physically Abused:   . Sexually Abused:      Physical Exam: BP 110/64 (BP Location: Left Arm, Patient Position: Sitting, Cuff Size: Normal)   Pulse 76   Ht 5\' 7"  (1.702 m)   Wt 210 lb 4 oz (95.4 kg)   BMI 32.93 kg/m  Constitutional: generally well-appearing Psychiatric: alert and oriented x3 Abdomen: soft, nontender, nondistended, no obvious ascites, no peritoneal signs, normal bowel sounds No peripheral edema noted in lower extremities  Assessment and plan: 69 y.o. female with left  upper quadrant pain, GERD  First I think that Benefiber certainly might have been causing some  of her left upper quadrant discomfort since it is pretty clear that since she stopped it the left upper quadrant pains are quite a bit improved.  She is still bothered periodically but only about once a month or so.  This can last for a few hours up to overnight.  No associated nausea or vomiting.  She has had a lot of testing, see those summarized above and I recommended no further testing for now.  She is gaining weight and has no alarm symptoms.  I commended her on trying to cut back her proton pump inhibitor successfully to every other day.  She is going to cut back to as needed basis only.  She has had no worsening of her chronic mild GERD.  Please see the "Patient Instructions" section for addition details about the plan.  Owens Loffler, MD [facility] 08/13/2019, 2:13 PM   Total time on date of encounter was 31 minutes (this included time spent preparing to see the patient reviewing records; obtaining and/or reviewing separately obtained history; performing a medically appropriate exam and/or evaluation; counseling and educating the patient and family if present; ordering medications, tests or procedures if applicable; and documenting clinical information in the health record).

## 2019-09-05 ENCOUNTER — Ambulatory Visit (INDEPENDENT_AMBULATORY_CARE_PROVIDER_SITE_OTHER): Payer: Medicare PPO

## 2019-09-05 VITALS — Wt 195.0 lb

## 2019-09-05 DIAGNOSIS — Z0000 Encounter for general adult medical examination without abnormal findings: Secondary | ICD-10-CM

## 2019-09-05 NOTE — Patient Instructions (Addendum)
Kristin [name] , Thank you for taking time to come for your Medicare Wellness Visit. I appreciate your ongoing commitment to your health goals. Please review the following plan we discussed and let me know if I can assist you in the future.   Screening recommendations/referrals: Colonoscopy: Done 12/85/17 Mammogram: Done 11/19/18 Bone Density: Done 10/29/18 Recommended yearly ophthalmology/optometry visit for glaucoma screening and checkup Recommended yearly dental visit for hygiene and checkup  Vaccinations: Influenza vaccine: Up to date Pneumococcal vaccine: Up to date Tdap vaccine: Up to date Shingles vaccine: Compoleted 7/3 & 11/20/17 Covid-19:Pt will call back with dates  Advanced directives: Copy in chart  Conditions/risks identified: Stay healthy  Next appointment: Follow up in one year for your annual wellness visit    Preventive Care 68 Years and Older, Female Preventive care refers to lifestyle choices and visits with your health care provider that can promote health and wellness. What does preventive care include?  A yearly physical exam. This is also called an annual well check.  Dental exams once or twice a year.  Routine eye exams. Ask your health care provider how often you should have your eyes checked.  Personal lifestyle choices, including:  Daily care of your teeth and gums.  Regular physical activity.  Eating a healthy diet.  Avoiding tobacco and drug use.  Limiting alcohol use.  Practicing safe sex.  Taking low-dose aspirin every day.  Taking vitamin and mineral supplements as recommended by your health care provider. What happens during an annual well check? The services and screenings done by your health care provider during your annual well check will depend on your age, overall health, lifestyle risk factors, and family history of disease. Counseling  Your health care provider may ask you questions about your:  Alcohol use.  Tobacco  use.  Drug use.  Emotional well-being.  Home and relationship well-being.  Sexual activity.  Eating habits.  History of falls.  Memory and ability to understand (cognition).  Work and work Statistician.  Reproductive health. Screening  You may have the following tests or measurements:  Height, weight, and BMI.  Blood pressure.  Lipid and cholesterol levels. These may be checked every 5 years, or more frequently if you are over 23 years old.  Skin check.  Lung cancer screening. You may have this screening every year starting at age 20 if you have a 30-pack-year history of smoking and currently smoke or have quit within the past 15 years.  Fecal occult blood test (FOBT) of the stool. You may have this test every year starting at age 30.  Flexible sigmoidoscopy or colonoscopy. You may have a sigmoidoscopy every 5 years or a colonoscopy every 10 years starting at age 39.  Hepatitis C blood test.  Hepatitis B blood test.  Sexually transmitted disease (STD) testing.  Diabetes screening. This is done by checking your blood sugar (glucose) after you have not eaten for a while (fasting). You may have this done every 1-3 years.  Bone density scan. This is done to screen for osteoporosis. You may have this done starting at age 38.  Mammogram. This may be done every 1-2 years. Talk to your health care provider about how often you should have regular mammograms. Talk with your health care provider about your test results, treatment options, and if necessary, the need for more tests. Vaccines  Your health care provider may recommend certain vaccines, such as:  Influenza vaccine. This is recommended every year.  Tetanus, diphtheria, and acellular pertussis (  Tdap, Td) vaccine. You may need a Td booster every 10 years.  Zoster vaccine. You may need this after age 68.  Pneumococcal 13-valent conjugate (PCV13) vaccine. One dose is recommended after age 13.  Pneumococcal  polysaccharide (PPSV23) vaccine. One dose is recommended after age 44. Talk to your health care provider about which screenings and vaccines you need and how often you need them. This information is not intended to replace advice given to you by your health care provider. Make sure you discuss any questions you have with your health care provider. Document Released: 02/06/2015 Document Revised: 09/30/2015 Document Reviewed: 11/11/2014 Elsevier Interactive Patient Education  2017 [employer] Prevention in the Home Falls can cause injuries. They can happen to people of all ages. There are many things you can do to make your home safe and to help prevent falls. What can I do on the outside of my home?  Regularly fix the edges of walkways and driveways and fix any cracks.  Remove anything that might make you trip as you walk through a door, such as a raised step or threshold.  Trim any bushes or trees on the path to your home.  Use bright outdoor lighting.  Clear any walking paths of anything that might make someone trip, such as rocks or tools.  Regularly check to see if handrails are loose or broken. Make sure that both sides of any steps have handrails.  Any raised decks and porches should have guardrails on the edges.  Have any leaves, snow, or ice cleared regularly.  Use sand or salt on walking paths during winter.  Clean up any spills in your garage right away. This includes oil or grease spills. What can I do in the bathroom?  Use night lights.  Install grab bars by the toilet and in the tub and shower. Do not use towel bars as grab bars.  Use non-skid mats or decals in the tub or shower.  If you need to sit down in the shower, use a plastic, non-slip stool.  Keep the floor dry. Clean up any water that spills on the floor as soon as it happens.  Remove soap buildup in the tub or shower regularly.  Attach bath mats securely with double-sided non-slip rug  tape.  Do not have throw rugs and other things on the floor that can make you trip. What can I do in the bedroom?  Use night lights.  Make sure that you have a light by your bed that is easy to reach.  Do not use any sheets or blankets that are too big for your bed. They should not hang down onto the floor.  Have a firm chair that has side arms. You can use this for support while you get dressed.  Do not have throw rugs and other things on the floor that can make you trip. What can I do in the kitchen?  Clean up any spills right away.  Avoid walking on wet floors.  Keep items that you use a lot in easy-to-reach places.  If you need to reach something above you, use a strong step stool that has a grab bar.  Keep electrical cords out of the way.  Do not use floor polish or wax that makes floors slippery. If you must use wax, use non-skid floor wax.  Do not have throw rugs and other things on the floor that can make you trip. What can I do with my stairs?  Do  not leave any items on the stairs.  Make sure that there are handrails on both sides of the stairs and use them. Fix handrails that are broken or loose. Make sure that handrails are as long as the stairways.  Check any carpeting to make sure that it is firmly attached to the stairs. Fix any carpet that is loose or worn.  Avoid having throw rugs at the top or bottom of the stairs. If you do have throw rugs, attach them to the floor with carpet tape.  Make sure that you have a light switch at the top of the stairs and the bottom of the stairs. If you do not have them, ask someone to add them for you. What else can I do to help prevent falls?  Wear shoes that:  Do not have high heels.  Have rubber bottoms.  Are comfortable and fit you well.  Are closed at the toe. Do not wear sandals.  If you use a stepladder:  Make sure that it is fully opened. Do not climb a closed stepladder.  Make sure that both sides of the  stepladder are locked into place.  Ask someone to hold it for you, if possible.  Clearly mark and make sure that you can see:  Any grab bars or handrails.  First and last steps.  Where the edge of each step is.  Use tools that help you move around (mobility aids) if they are needed. These include:  Canes.  Walkers.  Scooters.  Crutches.  Turn on the lights when you go into a dark area. Replace any light bulbs as soon as they burn out.  Set up your furniture so you have a clear path. Avoid moving your furniture around.  If any of your floors are uneven, fix them.  If there are any pets around you, be aware of where they are.  Review your medicines with your doctor. Some medicines can make you feel dizzy. This can increase your chance of falling. Ask your doctor what other things that you can do to help prevent falls. This information is not intended to replace advice given to you by your health care provider. Make sure you discuss any questions you have with your health care provider. Document Released: 11/06/2008 Document Revised: 06/18/2015 Document Reviewed: 02/14/2014 Elsevier Interactive Patient Education  2017 [employer].

## 2019-09-05 NOTE — Progress Notes (Signed)
Virtual Visit via Telephone Note  I connected with  Kristin [name] on 09/05/19 at 11:45 AM EDT by telephone and verified that I am speaking with the correct person using two identifiers.  Medicare Annual Wellness visit completed telephonically due to Covid-19 pandemic.   Persons participating in this call: This Health Coach and this patient.   Location: Patient: Home Provider: Office   I discussed the limitations, risks, security and privacy concerns of performing an evaluation and management service by telephone and the availability of in person appointments. The patient expressed understanding and agreed to proceed.  Unable to perform video visit due to video visit attempted and failed and/or patient does not have video capability.   Some vital signs may be absent or patient reported.   Willette Brace, LPN    Subjective:   Kristin [name] is a 69 y.o. female who presents for an Initial Medicare Annual Wellness Visit.  Review of Systems     Cardiac Risk Factors include: advanced age (>53men, >69 women);obesity (BMI >30kg/m2)     Objective:    Today's Vitals   09/05/19 1140  Weight: 195 lb (88.5 kg)   Body mass index is 30.54 kg/m.  Advanced Directives 09/05/2019 05/29/2019 05/13/2019 04/02/2019 02/27/2019 01/28/2019 01/08/2019  Does Patient Have a Medical Advance Directive? Yes Yes No Yes Yes Yes Yes  Type of Paramedic of [place];Living will [place];Living will - [place];Living will [place];Living will Healthcare Power of [place]  Does patient want to make changes to medical advance directive? - No - Patient declined - No - Patient declined - - No - Patient declined  Copy of [place] in Chart? Yes - validated most recent copy scanned in chart (See row information) No - copy requested - No - copy requested Yes - validated most recent copy scanned  in chart (See row information) No - copy requested No - copy requested  Would patient like information on creating a medical advance directive? - - No - Patient declined - - - -    Current Medications (verified) Outpatient Encounter Medications as of 09/05/2019  Medication Sig  . acetaminophen (TYLENOL) 500 MG tablet Take 1,000 mg by mouth every 6 (six) hours as needed (for headaches or mild pain).  . CALCIUM PO Take 400 mg by mouth daily.  . Cholecalciferol (VITAMIN D3) 25 MCG (1000 UT) CAPS Take 1,000 Units by mouth daily.  . Cyanocobalamin (B-12) 1000 MCG SUBL Place 1,000 mcg under the tongue every Friday.   . diclofenac sodium (VOLTAREN) 1 % GEL Apply 2 g topically 4 (four) times daily as needed.  . diltiazem (CARDIZEM CD) 120 MG 24 hr capsule Take 1 capsule (120 mg total) by mouth 2 (two) times daily. May take extra 1 tablet daily for breakthrough afib.  Marland Kitchen diltiazem (CARDIZEM) 30 MG tablet Take 1 tablet every 4 hours AS NEEDED for AFIB heart rate >100  . dofetilide (TIKOSYN) 500 MCG capsule Take 1 capsule (500 mcg total) by mouth 2 (two) times daily.  Marland Kitchen ELIQUIS 5 MG TABS tablet TAKE 1 TABLET BY MOUTH TWICE DAILY.  Marland Kitchen levothyroxine (SYNTHROID) 125 MCG tablet TAKE 1 TABLET BY MOUTH DAILY.  . Magnesium 250 MG TABS Take 250 mg by mouth every evening.   . Potassium 99 MG TABS Take 99 mg by mouth daily.   . sodium chloride (OCEAN) 0.65 % SOLN nasal spray Place 1 spray  into both nostrils as needed for congestion.   . traMADol (ULTRAM) 50 MG tablet Take 1 tablet (50 mg total) by mouth every 6 (six) hours as needed for moderate pain. (Patient not taking: Reported on 09/05/2019)  . [DISCONTINUED] amoxicillin (AMOXIL) 500 MG capsule Take 2,000 mg by mouth See admin instructions. Take 4 capsules (2,000 mg) by mouth 1 hour before dental visits (Patient not taking: Reported on 08/13/2019)  . [DISCONTINUED] Wheat Dextrin (BENEFIBER) POWD Take by mouth See admin instructions. Mix 1 tablespoonful of powder  into 4-8 ounces of water and drink once a day (Patient not taking: Reported on 08/13/2019)   No facility-administered encounter medications on file as of 09/05/2019.    Allergies (verified) Morphine and related, Neomycin, and Adhesive [tape]   History: Past Medical History:  Diagnosis Date  . AR (allergic rhinitis)   . Atrial fibrillation ([place])   . Atrial flutter ([place])   . Clotting disorder ([place])   . Diverticulosis   . GERD (gastroesophageal reflux disease)   . Hiatal hernia   . Hiatal hernia   . Hypothyroidism   . Migraines   . Mild sleep apnea   . Osteoarthritis   . Osteopenia after menopause 06/14/2017   DEXA 08/2016 [facility]: T = -1.10 radius, -1.0 femur; lumbar spine elevated due to DJD; recheck 2-3 years.  . Pacemaker    CHB  . Paroxysmal A-fib ([place])   . PUD (peptic ulcer disease)   . Sleep apnea   . Thyroid nodule   . Transient complete heart block 1996   Past Surgical History:  Procedure Laterality Date  . ATRIAL FIBRILLATION ABLATION N/A 01/08/2019   Procedure: ATRIAL FIBRILLATION ABLATION;  Surgeon: [name], MD;  Location: [facility];  Service: Cardiovascular;  Laterality: N/A;  . ATRIAL FIBRILLATION ABLATION N/A 04/02/2019   Procedure: ATRIAL FIBRILLATION ABLATION;  Surgeon: [name], MD;  Location: [facility];  Service: Cardiovascular;  Laterality: N/A;  . CARDIOVERSION N/A 01/28/2019   Procedure: CARDIOVERSION;  Surgeon: Donato Heinz, MD;  Location: [facility] ENDOSCOPY;  Service: Endoscopy;  Laterality: N/A;  . CARDIOVERSION N/A 02/27/2019   Procedure: CARDIOVERSION;  Surgeon: Donato Heinz, MD;  Location[facility] ENDOSCOPY;  Service: Endoscopy;  Laterality: N/A;  . HIP ARTHROPLASTY Right 2008  . PACEMAKER GENERATOR CHANGE  03/01/2007   performed by Dr Rosita Fire at [facility])  . PACEMAKER INSERTION  09/08/1994   performed in [facility] for transient complete heart block  . THYROIDECTOMY, PARTIAL  1986   BENIGN  NODULES  . TOTAL HIP ARTHROPLASTY Left 02/13/2018   Procedure: TOTAL HIP ARTHROPLASTY ANTERIOR APPROACH;  Surgeon: Paralee Cancel, MD;  Location: [facility] ORS;  Service: Orthopedics;  Laterality: Left;  70 mins  . TUBAL LIGATION  1987   Family History  Problem Relation Age of Onset  . Diabetes Mellitus II Maternal Uncle   . Cancer Maternal Grandmother        ENDOMETRIAL  . Rheum arthritis Mother   . Peptic Ulcer Mother   . COPD Brother   . Drug abuse Brother   . Breast cancer Neg Hx    Social History   Socioeconomic History  . Marital status: Married    Spouse name: Not on file  . Number of children: 0  . Years of education: Not on file  . Highest education level: Not on file  Occupational History  . Occupation: retired  Tobacco Use  . Smoking status: Never Smoker  . Smokeless tobacco: Never Used  Vaping Use  . Vaping Use: Never used  Substance and Sexual Activity  . Alcohol use: Never    Alcohol/week: 1.0 standard drink    Types: 1 Glasses of wine per week  . Drug use: No  . Sexual activity: Not Currently  Other Topics Concern  . 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 Difficulty of Paying Living Expenses: Not hard at all  Food Insecurity: No Food Insecurity  . Worried About Charity fundraiser in the Last Year: Never true  . Ran Out of Food in the Last Year: Never true  Transportation Needs: No Transportation Needs  . Lack of Transportation (Medical): No  . Lack of Transportation (Non-Medical): No  Physical Activity: Sufficiently Active  . Days of Exercise per Week: 5 days  . Minutes of Exercise per Session: 30 min  Stress: No Stress Concern Present  . Feeling of Stress : Not at all  Social Connections: Unknown  . Frequency of Communication with Friends and Family: More than three times a week  . Frequency of Social  Gatherings with Friends and Family: Once a week  . Attends Religious Services: Never  . Active Member of Clubs or Organizations: Yes  . Attends Archivist Meetings: 1 to 4 times per year  . Marital Status: Not on file    Tobacco Counseling Counseling given: Not Answered   Clinical Intake:  Pre-visit preparation completed: Yes  Pain : No/denies pain     BMI - recorded: 30.54 Nutritional Status: BMI > 30  Obese Diabetes: No  How often do you need to have someone help you when you read instructions, pamphlets, or other written materials from your doctor or pharmacy?: 1 - Never  Diabetic?No  Interpreter Needed?: No  Information entered by :: Charlott Rakes, LPN   Activities of Daily Living In your present state of health, do you have any difficulty performing the following activities: 09/05/2019 05/29/2019  Hearing? N N  Vision? N N  Difficulty concentrating or making decisions? N N  Walking or climbing stairs? N N  Dressing or bathing? N N  Doing errands, shopping? N Y  Conservation officer, nature and eating ? N -  Using the Toilet? N -  In the past six months, have you accidently leaked urine? N -  Do you have problems with loss of bowel control? N -  Managing your Medications? N -  Managing your Finances? N -  Housekeeping or managing your Housekeeping? N -  Some recent data might be hidden    Patient Care Team: Leamon Arnt, MD as PCP - General (Family Medicine) Melvenia Beam, MD as Consulting Physician (Neurology) Paralee Cancel, MD as Consulting Physician (Orthopedic Surgery) [name], MD as Consulting Physician (Cardiology) Efrain Sella, MD as Consulting Physician (Gastroenterology)  Indicate any recent Medical Services you may have received from other than [facility] providers in the past year (date may be approximate).     Assessment:   This is a routine wellness examination for Kristin [name].  Hearing/Vision screen  Hearing Screening   125Hz  250Hz   500Hz  1000Hz  2000Hz  3000Hz  4000Hz  6000Hz  8000Hz   Right ear:           Left ear:           Comments: Pt denies any difficulty hearing  Vision  Screening Comments: Pt follows up with eye Dr at [facility] eye   Dietary issues and exercise activities discussed: Current Exercise Habits: Home exercise routine, Type of exercise: walking, Time (Minutes): 30, Frequency (Times/Week): 5, Weekly Exercise (Minutes/Week): 150  Goals    . Patient Stated     Stay healthy       Depression Screen PHQ 2/9 Scores 09/05/2019 10/08/2018 06/14/2017  PHQ - 2 Score 0 0 0  PHQ- 9 Score - - 0    Fall Risk Fall Risk  09/05/2019 10/08/2018 06/14/2017  Falls in the past year? 0 0 No  Number falls in past yr: 0 0 -  Injury with Fall? 0 0 -  Risk for fall due to : Impaired vision - -  Follow up Falls prevention discussed Falls evaluation completed -    Any stairs in or around the home? Yes  If so, are there any without handrails? No  Home free of loose throw rugs in walkways, pet beds, electrical cords, etc? Yes  Adequate lighting in your home to reduce risk of falls? Yes   ASSISTIVE DEVICES UTILIZED TO PREVENT FALLS:  Life alert? No  Use of a cane, walker or w/c? No  Grab bars in the bathroom? No  Shower chair or bench in shower? No  Elevated toilet seat or a handicapped toilet? No   TIMED UP AND GO:  Was the test performed? No .    Cognitive Function:        Immunizations Immunization History  Administered Date(s) Administered  . Fluad Quad(high Dose 65+) 10/08/2018  . Hepatitis B, ped/adol 11/24/2009  . Influenza, High Dose Seasonal PF 11/01/2016, 10/20/2017  . Influenza, Quadrivalent, Recombinant, Inj, Pf 01/08/2013  . Influenza, Seasonal, Injecte, Preservative Fre 11/11/2014  . Influenza,inj,Quad PF,6+ Mos 12/24/2015  . Pneumococcal Conjugate-13 08/17/2016  . Pneumococcal Polysaccharide-23 10/20/2017  . Tdap 09/07/2010, 09/07/2010, 07/01/2013  . Zoster 01/08/2013  . Zoster Recombinat  (Shingrix) 07/26/2017, 11/20/2017    TDAP status: Up to date Flu Vaccine status: Up to date Pneumococcal vaccine status: Up to date Covid-19 vaccine status: Completed vaccines  Qualifies for Shingles Vaccine? Yes   Zostavax completed Yes   Shingrix Completed?: Yes  Screening Tests Health Maintenance  Topic Date Due  . COVID-19 Vaccine (1) Never done  . INFLUENZA VACCINE  08/25/2019  . MAMMOGRAM  11/19/2019  . DEXA SCAN  10/28/2020  . TETANUS/TDAP  07/02/2023  . COLONOSCOPY  12/28/2025  . Hepatitis C Screening  Completed  . PNA vac Low Risk Adult  Completed    Health Maintenance  Health Maintenance Due  Topic Date Due  . COVID-19 Vaccine (1) Never done  . INFLUENZA VACCINE  08/25/2019    Colorectal cancer screening: Completed 12/29/15. Repeat every 10 years Mammogram status: Completed 11/19/18. Repeat every year Bone Density status: Completed 10/29/18. Results reflect: Bone density results: OSTEOPENIA. Repeat every 2 years.    Additional Screening:  Hepatitis C Screening:  Completed 07/31/15  Vision Screening: Recommended annual ophthalmology exams for early detection of glaucoma and other disorders of the eye. Is the patient up to date with their annual eye exam?  Yes  Who is the provider or what is the name of the office in which the patient attends annual eye exams? [facility]   Dental Screening: Recommended annual dental exams for proper oral hygiene  Community Resource Referral / Chronic Care Management: CRR required this visit?  No   CCM required this visit?  No  Plan:     I have personally reviewed and noted the following in the patient's chart:   . Medical and social history . Use of alcohol, tobacco or illicit drugs  . Current medications and supplements . Functional ability and status . Nutritional status . Physical activity . Advanced directives . List of other physicians . Hospitalizations, surgeries, and ER visits in previous 12  months . Vitals . Screenings to include cognitive, depression, and falls . Referrals and appointments  In addition, I have reviewed and discussed with patient certain preventive protocols, quality metrics, and best practice recommendations. A written personalized care plan for preventive services as well as general preventive health recommendations were provided to patient.     Willette Brace, LPN   04/07/9456   Nurse Notes: None

## 2019-09-06 ENCOUNTER — Telehealth: Payer: Self-pay | Admitting: Family Medicine

## 2019-09-06 NOTE — Telephone Encounter (Signed)
Patients Covid vaccine dates   1st dose 02/18/19  2nd dose  03/18/19

## 2019-09-06 NOTE — Telephone Encounter (Signed)
Pfizer or Moderna?

## 2019-09-10 ENCOUNTER — Ambulatory Visit (INDEPENDENT_AMBULATORY_CARE_PROVIDER_SITE_OTHER): Payer: Medicare PPO | Admitting: *Deleted

## 2019-09-10 DIAGNOSIS — I442 Atrioventricular block, complete: Secondary | ICD-10-CM

## 2019-09-10 LAB — CUP PACEART REMOTE DEVICE CHECK
Battery Impedance: 5777 Ohm
Battery Remaining Longevity: 12 mo
Battery Voltage: 2.65 V
Brady Statistic RV Percent Paced: 0 %
Date Time Interrogation Session: 20210817112239
Implantable Lead Implant Date: 19960815
Implantable Lead Implant Date: 19960815
Implantable Lead Location: 753859
Implantable Lead Location: 753860
Implantable Lead Model: 5024
Implantable Pulse Generator Implant Date: 20090205
Lead Channel Impedance Value: 67 Ohm
Lead Channel Impedance Value: 814 Ohm
Lead Channel Pacing Threshold Amplitude: 1.375 V
Lead Channel Pacing Threshold Pulse Width: 0.4 ms
Lead Channel Setting Pacing Amplitude: 2.75 V
Lead Channel Setting Pacing Pulse Width: 0.4 ms
Lead Channel Setting Sensing Sensitivity: 2.8 mV

## 2019-09-12 NOTE — Telephone Encounter (Signed)
Immunization report UTD

## 2019-09-12 NOTE — Progress Notes (Signed)
Remote pacemaker transmission.   

## 2019-09-12 NOTE — Telephone Encounter (Signed)
Patient received [facility]

## 2019-09-20 ENCOUNTER — Other Ambulatory Visit: Payer: Self-pay | Admitting: Family Medicine

## 2019-10-16 ENCOUNTER — Ambulatory Visit: Payer: Medicare PPO | Admitting: Internal Medicine

## 2019-10-16 ENCOUNTER — Other Ambulatory Visit: Payer: Self-pay

## 2019-10-16 ENCOUNTER — Encounter: Payer: Self-pay | Admitting: Internal Medicine

## 2019-10-16 VITALS — BP 138/78 | HR 68 | Ht 67.0 in | Wt 207.2 lb

## 2019-10-16 DIAGNOSIS — I4892 Unspecified atrial flutter: Secondary | ICD-10-CM | POA: Diagnosis not present

## 2019-10-16 DIAGNOSIS — I442 Atrioventricular block, complete: Secondary | ICD-10-CM | POA: Diagnosis not present

## 2019-10-16 DIAGNOSIS — Z950 Presence of cardiac pacemaker: Secondary | ICD-10-CM

## 2019-10-16 DIAGNOSIS — I480 Paroxysmal atrial fibrillation: Secondary | ICD-10-CM

## 2019-10-16 LAB — CUP PACEART INCLINIC DEVICE CHECK
Battery Impedance: 6015 Ohm
Battery Remaining Longevity: 10 mo
Battery Voltage: 2.66 V
Brady Statistic RV Percent Paced: 0 %
Date Time Interrogation Session: 20210922124111
Implantable Lead Implant Date: 19960815
Implantable Lead Implant Date: 19960815
Implantable Lead Location: 753859
Implantable Lead Location: 753860
Implantable Lead Model: 5024
Implantable Pulse Generator Implant Date: 20090205
Lead Channel Impedance Value: 67 Ohm
Lead Channel Impedance Value: 784 Ohm
Lead Channel Pacing Threshold Amplitude: 1.25 V
Lead Channel Pacing Threshold Pulse Width: 0.4 ms
Lead Channel Sensing Intrinsic Amplitude: 8 mV
Lead Channel Setting Pacing Amplitude: 2.25 V
Lead Channel Setting Pacing Pulse Width: 0.4 ms
Lead Channel Setting Sensing Sensitivity: 2.8 mV

## 2019-10-16 NOTE — Patient Instructions (Addendum)
Medication Instructions:  Your physician recommends that you continue on your current medications as directed. Please refer to the Current Medication list given to you today. *If you need a refill on your cardiac medications before your next appointment, please call your pharmacy*  Lab Work: BMP, Mag If you have labs (blood work) drawn today and your tests are completely normal, you will receive your results only by: Marland Kitchen MyChart Message (if you have MyChart) OR . A paper copy in the mail If you have any lab test that is abnormal or we need to change your treatment, we will call you to review the results.  Testing/Procedures: Please schedule Echo before next follow up.  Your physician has requested that you have an echocardiogram. Echocardiography is a painless test that uses sound waves to create images of your heart. It provides your doctor with information about the size and shape of your heart and how well your heart's chambers and valves are working. This procedure takes approximately one hour. There are no restrictions for this procedure.  Follow-Up: At [facility], you and your health needs are our priority.  As part of our continuing mission to provide you with exceptional heart care, we have created designated Provider Care Teams.  These Care Teams include your primary Cardiologist (physician) and Advanced Practice Providers (APPs -  Physician Assistants and Nurse Practitioners) who all work together to provide you with the care you need, when you need it.  We recommend signing up for the patient portal called "MyChart".  Sign up information is provided on this After Visit Summary.  MyChart is used to connect with patients for Virtual Visits (Telemedicine).  Patients are able to view lab/test results, encounter notes, upcoming appointments, etc.  Non-urgent messages can be sent to your provider as well.   To learn more about what you can do with MyChart, go to [email or web address].     Your next appointment:   Your physician wants you to follow-up in: 3 months with Dr. Rayann Heman..  Remote monitoring is used to monitor your Pacemaker from home. This monitoring reduces the number of office visits required to check your device to one time per year. It allows [place] to keep an eye on the functioning of your device to ensure it is working properly. You are scheduled for a device check from home on 11/11/19. You may send your transmission at any time that day. If you have a wireless device, the transmission will be sent automatically. After your physician reviews your transmission, you will receive a postcard with your next transmission date.  Other Instructions:

## 2019-10-16 NOTE — Progress Notes (Signed)
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 female who presents today for routine electrophysiology followup.  Since last being seen in our clinic, the patient reports doing very well.  Today, she denies symptoms of palpitations, chest pain, shortness of breath,  lower extremity edema, dizziness, presyncope, or syncope.  The patient is otherwise without complaint today.   Past Medical History:  Diagnosis Date  . AR (allergic rhinitis)   . Atrial fibrillation ([place])   . Atrial flutter ([place])   . Clotting disorder ([place])   . Diverticulosis   . GERD (gastroesophageal reflux disease)   . Hiatal hernia   . Hiatal hernia   . Hypothyroidism   . Migraines   . Mild sleep apnea   . Osteoarthritis   . Osteopenia after menopause 06/14/2017   DEXA 08/2016 [facility]: T = -1.10 radius, -1.0 femur; lumbar spine elevated due to DJD; recheck 2-3 years.  . Pacemaker    CHB  . Paroxysmal A-fib ([place])   . PUD (peptic ulcer disease)   . Sleep apnea   . Thyroid nodule   . Transient complete heart block 1996   Past Surgical History:  Procedure Laterality Date  . ATRIAL FIBRILLATION ABLATION N/A 01/08/2019   Procedure: ATRIAL FIBRILLATION ABLATION;  Surgeon: [name], MD;  Location: [place] [facility];  Service: Cardiovascular;  Laterality: N/A;  . ATRIAL FIBRILLATION ABLATION N/A 04/02/2019   Procedure: ATRIAL FIBRILLATION ABLATION;  Surgeon: [name], MD;  Location: [place] [facility];  Service: Cardiovascular;  Laterality: N/A;  . CARDIOVERSION N/A 01/28/2019   Procedure: CARDIOVERSION;  Surgeon: Donato Heinz, MD;  Location: [facility] ENDOSCOPY;  Service: Endoscopy;  Laterality: N/A;  . CARDIOVERSION N/A 02/27/2019   Procedure: CARDIOVERSION;  Surgeon: Donato Heinz, MD;  Location: [facility] ENDOSCOPY;  Service: Endoscopy;  Laterality: N/A;  . HIP ARTHROPLASTY Right 2008  . PACEMAKER GENERATOR CHANGE  03/01/2007   performed by Dr Rosita Fire at [facility])  . PACEMAKER INSERTION  09/08/1994   performed in [facility] for transient complete heart block  . THYROIDECTOMY, PARTIAL  1986   BENIGN NODULES  . TOTAL HIP ARTHROPLASTY Left 02/13/2018   Procedure: TOTAL HIP ARTHROPLASTY ANTERIOR APPROACH;  Surgeon: Paralee Cancel, MD;  Location: [facility] ORS;  Service: Orthopedics;  Laterality: Left;  70 mins  . TUBAL LIGATION  1987    ROS- all systems are reviewed and negative except as per HPI above  Current Outpatient Medications  Medication Sig Dispense Refill  . acetaminophen (TYLENOL) 500 MG tablet Take 1,000 mg by mouth every 6 (six) hours as needed (for headaches or mild pain).    . CALCIUM PO Take 400 mg by mouth daily.    . Cholecalciferol (VITAMIN D3) 25 MCG (1000 UT) CAPS Take 1,000 Units by mouth daily.    . Cyanocobalamin (B-12) 1000 MCG SUBL Place 1,000 mcg under the tongue every Friday.     . diclofenac sodium (VOLTAREN) 1 % GEL Apply 2 g topically 4 (four) times daily as needed. 100 g 5  . diltiazem (CARDIZEM CD) 120 MG 24 hr capsule Take 1 capsule (120 mg total) by mouth 2 (two) times daily. May take extra 1 tablet daily for breakthrough afib. 190 capsule 2  . diltiazem (CARDIZEM) 30 MG tablet Take 1 tablet every 4 hours AS NEEDED for AFIB heart rate >100 45 tablet 3  . dofetilide (TIKOSYN) 500 MCG capsule Take 1 capsule (  500 mcg total) by mouth 2 (two) times daily. 60 capsule 6  . ELIQUIS 5 MG TABS tablet TAKE 1 TABLET BY MOUTH TWICE DAILY. 180 tablet 1  . levothyroxine (SYNTHROID) 125 MCG tablet TAKE 1 TABLET BY MOUTH DAILY. 90 tablet 0  . Magnesium 250 MG TABS Take 250 mg by mouth every evening.     Marland Kitchen omeprazole (PRILOSEC) 20 MG capsule TAKE (1) CAPSULE DAILY. 60 capsule 0  . Potassium 99 MG TABS Take 99 mg by mouth daily.     . sodium chloride (OCEAN) 0.65 % SOLN nasal spray Place 1 spray into both nostrils as needed for congestion.     . traMADol (ULTRAM) 50 MG tablet Take 1 tablet (50 mg total) by mouth every 6 (six)  hours as needed for moderate pain. 30 tablet 0   No current facility-administered medications for this visit.    Physical Exam: Vitals:   10/16/19 1216  BP: 138/78  Pulse: 68  SpO2: 97%  Weight: 207 lb 3.2 oz (94 kg)  Height: 5\' 7"  (1.702 m)    GEN- The patient is well appearing, alert and oriented x 3 today.   Head- normocephalic, atraumatic Eyes-  Sclera clear, conjunctiva pink Ears- hearing intact Oropharynx- clear Lungs-  normal work of breathing Chest- pacemaker pocket is well healed Heart- Regular rate and rhythm  GI- soft,   Extremities- no clubbing, cyanosis, or edema  Pacemaker interrogation- reviewed in detail today,  See PACEART report  ekg tracing ordered today is personally reviewed and shows sinus, qt is stable  Assessment and Plan:  1. 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  MDT rep says her leads are MRI conditional.  We could consider replacement her generator with an MRI conditional device once ERI to allow her to proceed with the MRI that she needs for her hip. We also discussed options of device system extraction or MRI of her nonMRI conditional system at a tertiary center.  She will make no changes at this time.  2. Paroxysmal atrial fibrillation with both typical and atypical atrial flutter circuits ablated Doing very well post ablation.  She is on [place].  We follow her closely to avoid toxicity with this medicine.  She has severe RA enlargement and will likely require long term AAD to maintain sinus chads2vasc score is 2. Bmet, mg today echo  3. Severe RA enlargement I will order an echo to evaluate TR/ RV function  Risks, benefits and potential toxicities for medications prescribed and/or refilled reviewed with patient today.   Return to see me in 3 months  [name] MD, [facility] 10/16/2019 12:46  PM

## 2019-10-17 ENCOUNTER — Other Ambulatory Visit (HOSPITAL_COMMUNITY): Payer: Self-pay | Admitting: Nurse Practitioner

## 2019-10-17 LAB — BASIC METABOLIC PANEL
BUN/Creatinine Ratio: 18 (ref 12–28)
BUN: 16 mg/dL (ref 8–27)
CO2: 21 mmol/L (ref 20–29)
Calcium: 9.7 mg/dL (ref 8.7–10.3)
Chloride: 101 mmol/L (ref 96–106)
Creatinine, Ser: 0.89 mg/dL (ref 0.57–1.00)
GFR calc Af Amer: 77 mL/min/{1.73_m2} (ref >59)
GFR calc non Af Amer: 67 mL/min/{1.73_m2} (ref >59)
Glucose: 93 mg/dL (ref 65–99)
Potassium: 4.8 mmol/L (ref 3.5–5.2)
Sodium: 137 mmol/L (ref 134–144)

## 2019-10-17 LAB — MAGNESIUM: Magnesium: 2.2 mg/dL (ref 1.6–2.3)

## 2019-10-31 ENCOUNTER — Other Ambulatory Visit (HOSPITAL_COMMUNITY): Payer: Medicare PPO

## 2019-11-01 IMAGING — MG DIGITAL SCREENING BILAT W/ TOMO
6 of 12 series · 6 of 36 positions shown · non-contrast
Comparison: Previous exam(s).

CLINICAL DATA: Screening.

EXAM:
DIGITAL SCREENING BILATERAL MAMMOGRAM WITH TOMO AND CAD

[L MLO synth-2D (1 of 2)]
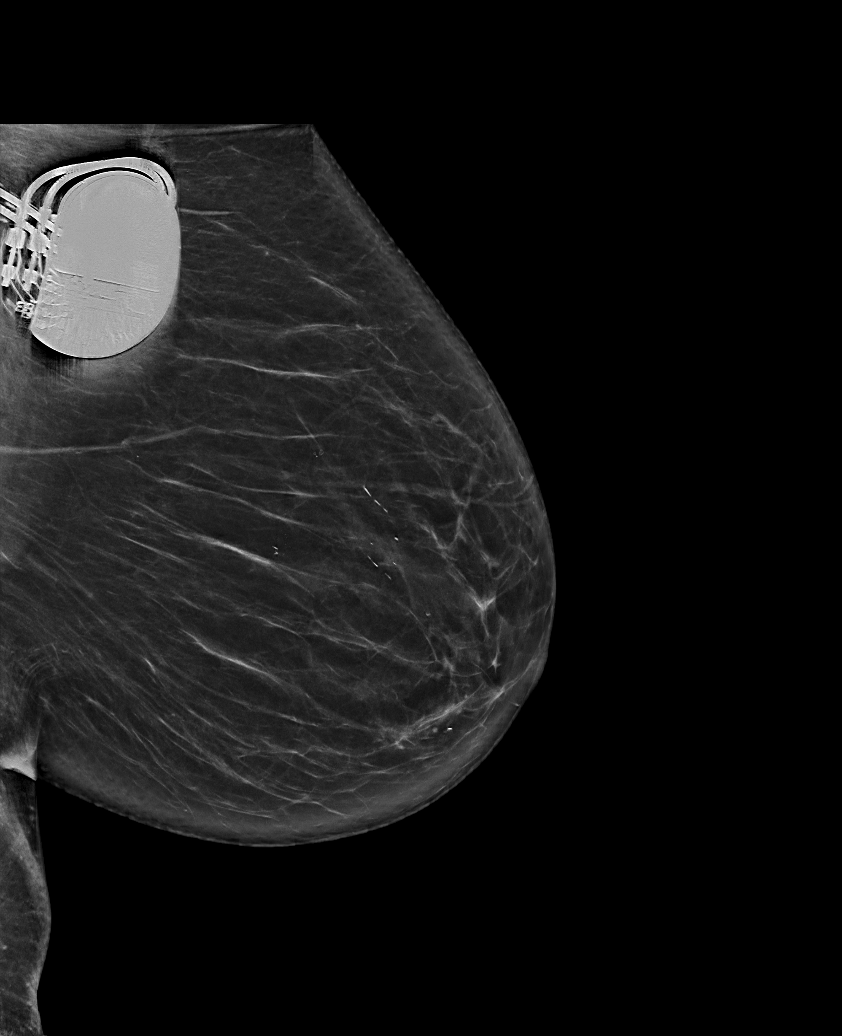

[L MLO synth-2D (2 of 2)]
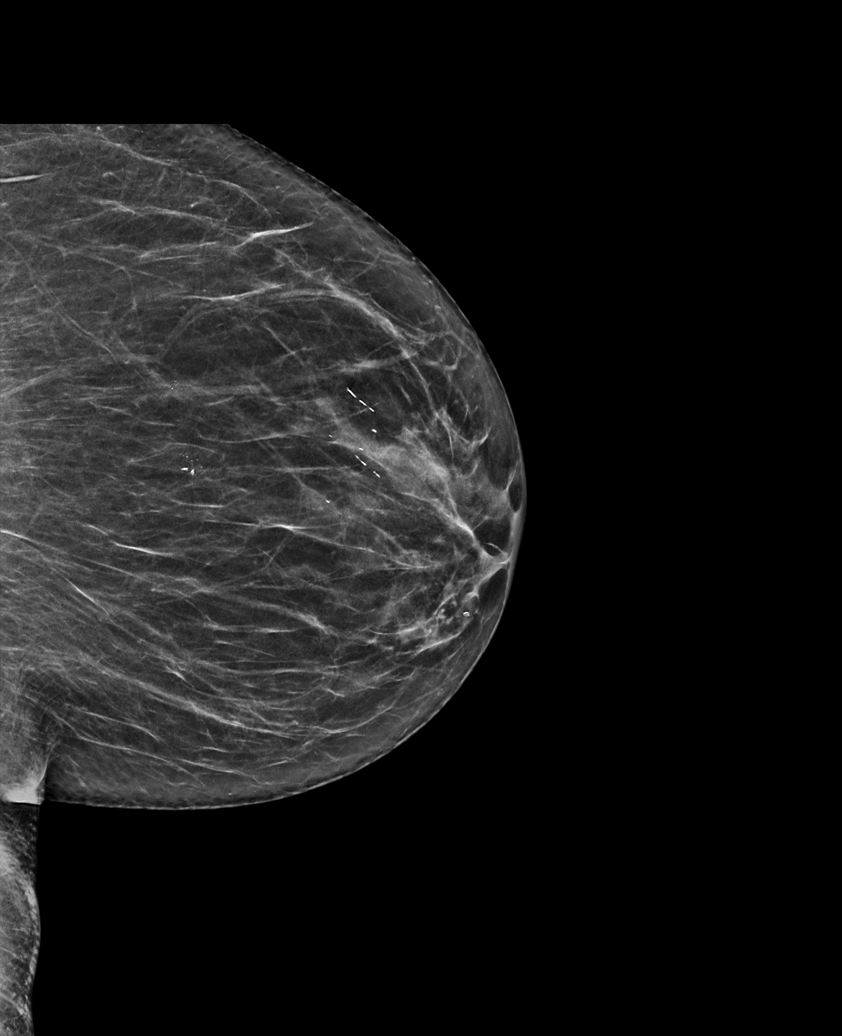

[L CC synth-2D (1 of 2)]
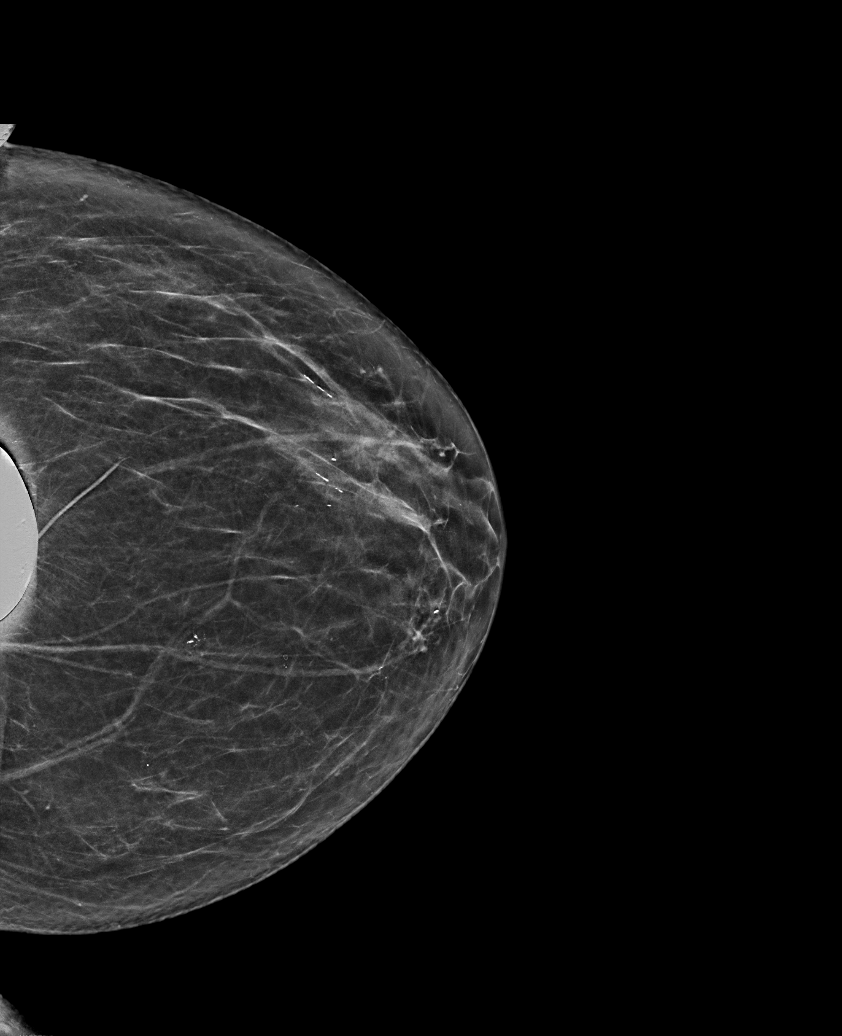

[L CC synth-2D (2 of 2)]
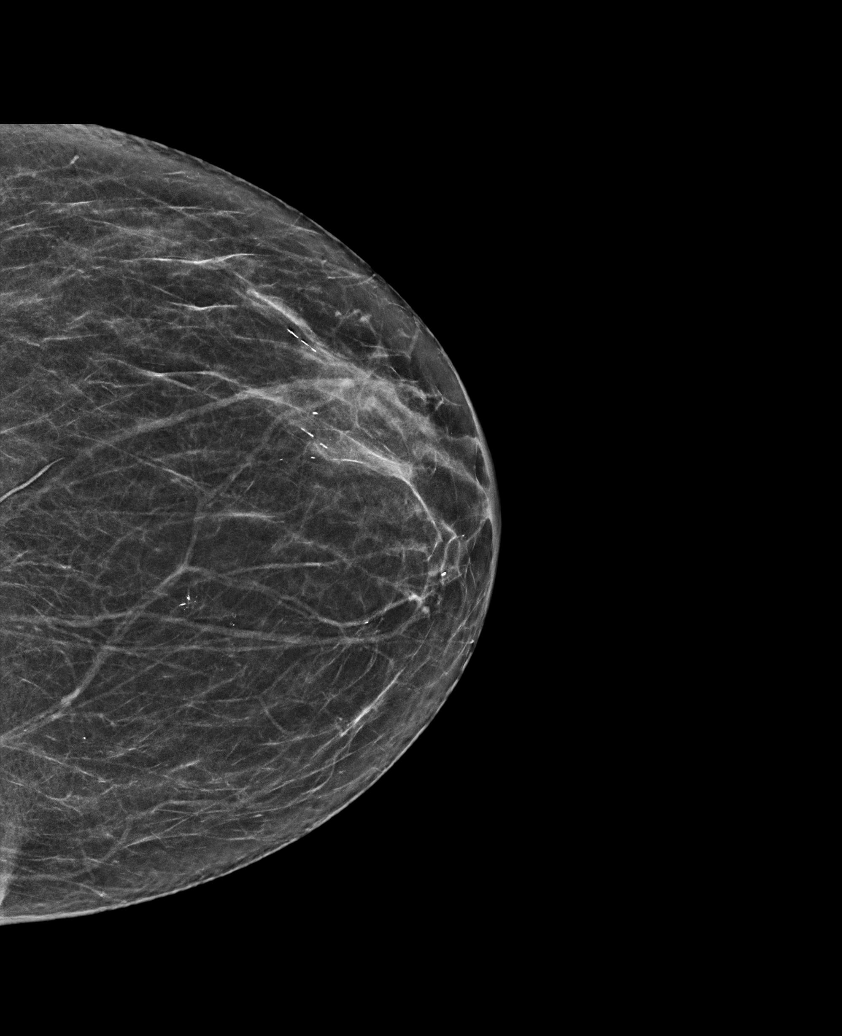

[R MLO synth-2D]
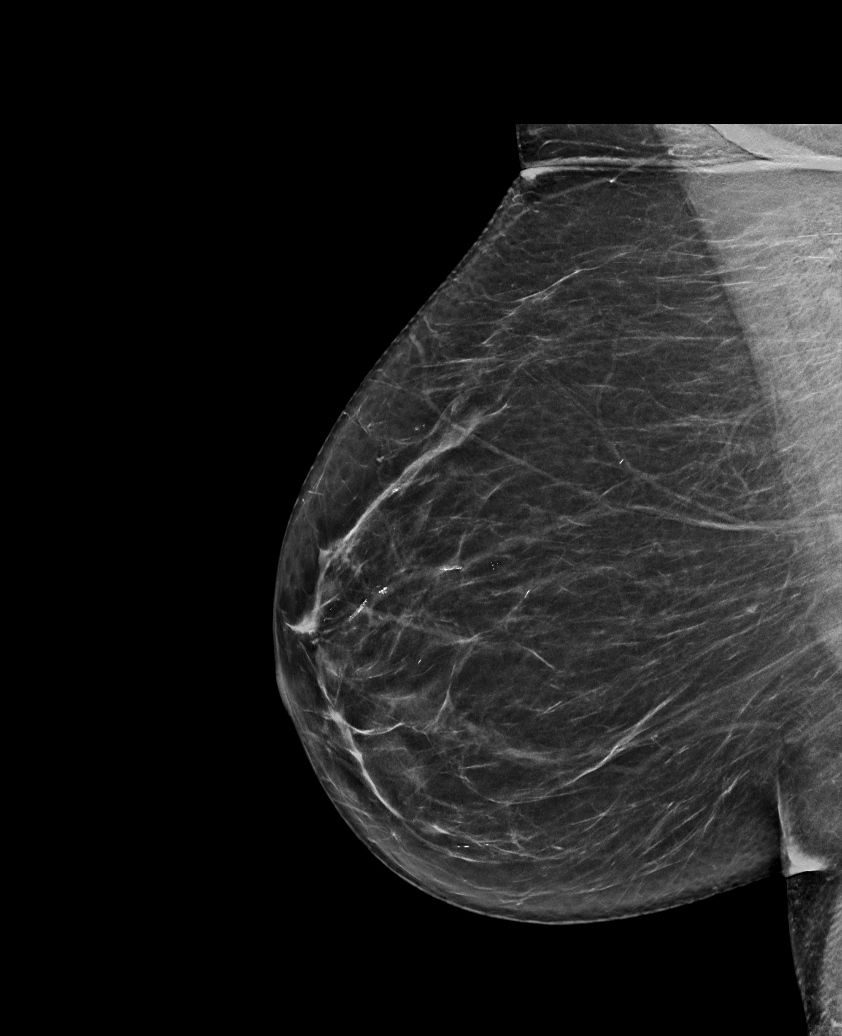

[R CC synth-2D]
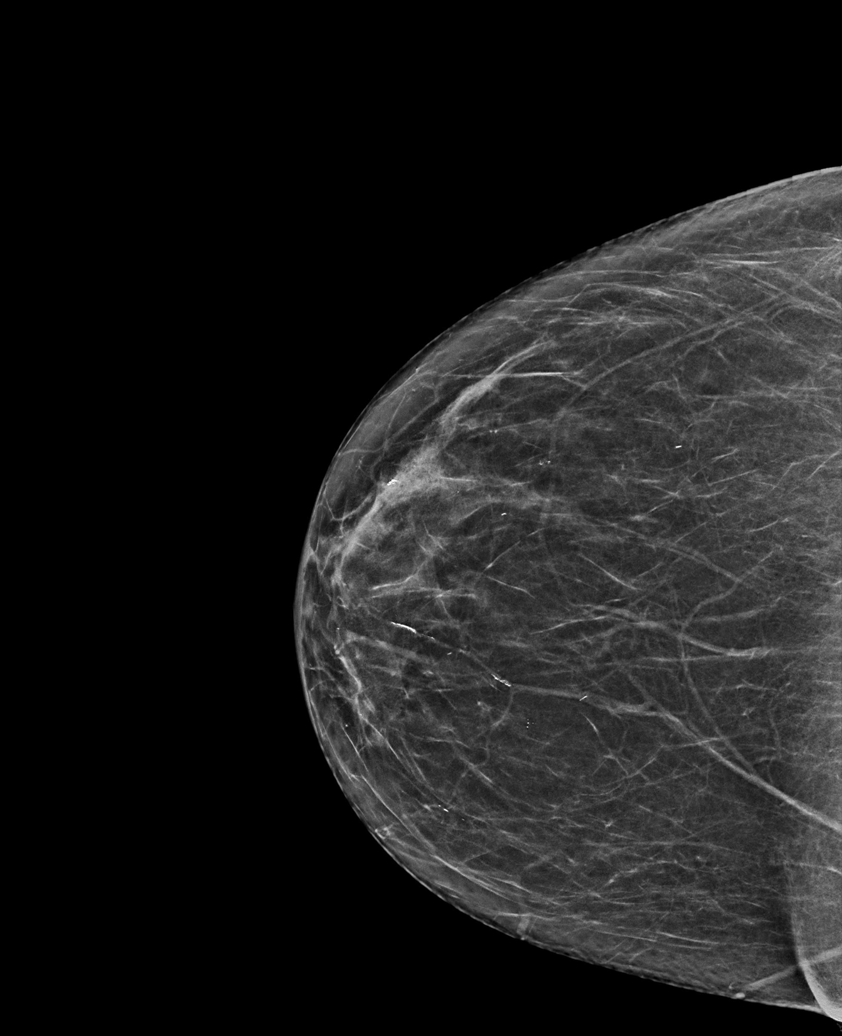

[6 of 36 positions shown; findings below may reference images not displayed]

ACR Breast Density Category b: There are scattered areas of
fibroglandular density.
FINDINGS: There are no findings suspicious for malignancy. Images were
processed with CAD.
IMPRESSION: No mammographic evidence of malignancy. A result letter of this
screening mammogram will be mailed directly to the patient.

RECOMMENDATION:
Screening mammogram in one year. (Code:[id number])

BI-RADS CATEGORY  1: Negative.

## 2019-11-08 ENCOUNTER — Other Ambulatory Visit: Payer: Self-pay | Admitting: Physician Assistant

## 2019-11-11 ENCOUNTER — Ambulatory Visit (INDEPENDENT_AMBULATORY_CARE_PROVIDER_SITE_OTHER): Payer: Medicare PPO

## 2019-11-11 DIAGNOSIS — I442 Atrioventricular block, complete: Secondary | ICD-10-CM

## 2019-11-12 LAB — CUP PACEART REMOTE DEVICE CHECK
Battery Impedance: 6541 Ohm
Battery Remaining Longevity: 8 mo
Battery Voltage: 2.64 V
Brady Statistic RV Percent Paced: 0 %
Date Time Interrogation Session: 20211018110809
Implantable Lead Implant Date: 19960815
Implantable Lead Implant Date: 19960815
Implantable Lead Location: 753859
Implantable Lead Location: 753860
Implantable Lead Model: 5024
Implantable Pulse Generator Implant Date: 20090205
Lead Channel Impedance Value: 67 Ohm
Lead Channel Impedance Value: 740 Ohm
Lead Channel Pacing Threshold Amplitude: 1.125 V
Lead Channel Pacing Threshold Pulse Width: 0.4 ms
Lead Channel Setting Pacing Amplitude: 2.25 V
Lead Channel Setting Pacing Pulse Width: 0.4 ms
Lead Channel Setting Sensing Sensitivity: 2.8 mV

## 2019-11-14 ENCOUNTER — Other Ambulatory Visit: Payer: Self-pay | Admitting: Family Medicine

## 2019-11-14 ENCOUNTER — Encounter: Payer: Self-pay | Admitting: Family Medicine

## 2019-11-14 ENCOUNTER — Other Ambulatory Visit: Payer: Self-pay

## 2019-11-14 ENCOUNTER — Ambulatory Visit (INDEPENDENT_AMBULATORY_CARE_PROVIDER_SITE_OTHER): Payer: Medicare PPO | Admitting: Family Medicine

## 2019-11-14 VITALS — BP 112/62 | HR 80 | Temp 98.0°F | Resp 16 | Ht 67.0 in | Wt 206.2 lb

## 2019-11-14 DIAGNOSIS — E890 Postprocedural hypothyroidism: Secondary | ICD-10-CM | POA: Diagnosis not present

## 2019-11-14 DIAGNOSIS — M1610 Unilateral primary osteoarthritis, unspecified hip: Secondary | ICD-10-CM

## 2019-11-14 DIAGNOSIS — Z7901 Long term (current) use of anticoagulants: Secondary | ICD-10-CM | POA: Diagnosis not present

## 2019-11-14 DIAGNOSIS — I480 Paroxysmal atrial fibrillation: Secondary | ICD-10-CM | POA: Diagnosis not present

## 2019-11-14 DIAGNOSIS — Z23 Encounter for immunization: Secondary | ICD-10-CM

## 2019-11-14 DIAGNOSIS — Z0000 Encounter for general adult medical examination without abnormal findings: Secondary | ICD-10-CM

## 2019-11-14 NOTE — Progress Notes (Signed)
Subjective  Chief Complaint  Patient presents with  . Annual Exam    fasting    HPI: Kristin [name] is a 69 y.o. female who presents to [facility] [place] today for a Female Wellness Visit. She also has the concerns and/or needs as listed above in the chief complaint. These will be addressed in addition to the Health Maintenance Visit.   Wellness Visit: annual visit with health maintenance review and exam without Pap   HM: screens are up to date. Due flu vaccine. mammo scheduled for later this month.  Chronic disease f/u and/or acute problem visit: (deemed necessary to be done in addition to the wellness visit):  PAF: reviewed cardiology evals and treatment. Pt is stable but struggles with fatigue from the medications to control her heart rate.   Low thyroid: feels tired.   Mood: struggles due to chronic problems. Has chronic hip pain. Wears her down. Denies depression has gained weight and this frustrated her as well.   Assessment  1. Annual physical exam   2. Hypothyroidism, postsurgical   3. Long term current use of anticoagulant   4. Paroxysmal atrial fibrillation (HCC)   5. Need for immunization against influenza   6. Primary osteoarthritis of hip, unspecified laterality      Plan  Female Wellness Visit:  Age appropriate Health Maintenance and Prevention measures were discussed with patient. Included topics are cancer screening recommendations, ways to keep healthy (see AVS) including dietary and exercise recommendations, regular eye and dental care, use of seat belts, and avoidance of moderate alcohol use and tobacco use.   BMI: discussed patient's BMI and encouraged positive lifestyle modifications to help get to or maintain a target BMI.  HM needs and immunizations were addressed and ordered. See below for orders. See HM and immunization section for updates.  Routine labs and screening tests ordered including cmp, cbc and lipids where  appropriate.  Discussed recommendations regarding Vit D and calcium supplementation (see AVS)  Chronic disease management visit and/or acute problem visit:  Visit was 40 minutes: reviewing heart records and counseling regarding dx and coping skills. No med changes. Check labs.   Follow up: annually  Orders Placed This Encounter  Procedures  . Flu Vaccine QUAD High Dose(Fluad)  . Hepatic function panel  . TSH  . Lipid panel   No orders of the defined types were placed in this encounter.     Lifestyle: Body mass index is 32.3 kg/m. Wt Readings from Last 3 Encounters:  11/14/19 206 lb 3.2 oz (93.5 kg)  10/16/19 207 lb 3.2 oz (94 kg)  09/05/19 195 lb (88.5 kg)     Patient Active Problem List   Diagnosis Date Noted  . Complete heart block ([place]) 10/06/2015    Priority: High    Overview:  s/p pacemaker   . Long term current use of anticoagulant 08/05/2015    Priority: High  . Pacemaker 10/14/2014    Priority: High  . Paroxysmal atrial fibrillation ([place]) 10/14/2014    Priority: High    S/p ablation 12/2018, Dr. Rayann Heman   . Hypothyroidism, postsurgical 01/08/2013    Priority: High  . OSA (obstructive sleep apnea) 10/20/2017    Priority: Medium    Class: Chronic  . Osteopenia after menopause 06/14/2017    Priority: Medium    DEXA 10/2018 [facility]: T = -1.4 lowest, stable osteopenia, recheck 2-3 years.  DEXA 08/2016 [facility]: T = -1.10 radius, -1.0 femur; lumbar spine elevated due to DJD;  recheck 2-3 years.   . Osteoarthritis of left hip 05/11/2017    Priority: Medium  . GERD (gastroesophageal reflux disease) 01/08/2013    Priority: Medium    Overview:  H/o esophageal stricture from nsaids s/p endoscopy 2007, Dr. Penelope Coop   . OA (osteoarthritis) 01/08/2013    Priority: Medium  . History of peptic ulcer disease - nsaid 01/08/2013    Priority: Medium    Overview:  Due to Nsaids   . Diverticulosis of large intestine without hemorrhage 01/04/2016    Priority: Low  .  Internal hemorrhoids without complication 66/59/9357    Priority: Low  . AR (allergic rhinitis) 01/08/2013    Priority: Low  . Atrial flutter ([place])   . Overweight (BMI 25.0-29.9) 02/14/2018  . S/P left THA, AA 02/13/2018   Health Maintenance  Topic Date Due  . MAMMOGRAM  11/19/2019  . DEXA SCAN  10/28/2020  . TETANUS/TDAP  07/02/2023  . COLONOSCOPY  12/28/2025  . INFLUENZA VACCINE  Completed  . COVID-19 Vaccine  Completed  . Hepatitis C Screening  Completed  . PNA vac Low Risk Adult  Completed   Immunization History  Administered Date(s) Administered  . Fluad Quad(high Dose 65+) 10/08/2018, 11/14/2019  . Hepatitis B, ped/adol 11/24/2009  . Influenza, High Dose Seasonal PF 11/01/2016, 10/20/2017  . Influenza, Quadrivalent, Recombinant, Inj, Pf 01/08/2013  . Influenza, Seasonal, Injecte, Preservative Fre 11/11/2014  . Influenza,inj,Quad PF,6+ Mos 12/24/2015  . Moderna SARS-COVID-2 Vaccination 02/18/2019, 03/18/2019  . Pneumococcal Conjugate-13 08/17/2016  . Pneumococcal Polysaccharide-23 10/20/2017  . Tdap 09/07/2010, 09/07/2010, 07/01/2013  . Zoster 01/08/2013  . Zoster Recombinat (Shingrix) 07/26/2017, 11/20/2017   We updated and reviewed the patient's past history in detail and it is documented below. Allergies: Patient is allergic to morphine and related, neomycin, and adhesive [tape]. Past Medical History Patient  has a past medical history of AR (allergic rhinitis), Atrial fibrillation ([place]), Atrial flutter ([place]), Clotting disorder ([place]), Diverticulosis, GERD (gastroesophageal reflux disease), Hiatal hernia, Hiatal hernia, Hypothyroidism, Migraines, Mild sleep apnea, Osteoarthritis, Osteopenia after menopause (06/14/2017), Pacemaker, Paroxysmal A-fib ([place]), PUD (peptic ulcer disease), Sleep apnea, Thyroid nodule, and Transient complete heart block (1996). Past Surgical History Patient  has a past surgical history that includes Thyroidectomy, partial (1986); Tubal ligation  (1987); Pacemaker insertion (09/08/1994); Pacemaker generator change (03/01/2007); Hip Arthroplasty (Right, 2008); Total hip arthroplasty (Left, 02/13/2018); ATRIAL FIBRILLATION ABLATION (N/A, 01/08/2019); Cardioversion (N/A, 01/28/2019); Cardioversion (N/A, 02/27/2019); and ATRIAL FIBRILLATION ABLATION (N/A, 04/02/2019). Family History: Patient family history includes COPD in her brother; Cancer in her maternal grandmother; Diabetes Mellitus II in her maternal uncle; Drug abuse in her brother; Peptic Ulcer in her mother; Rheum arthritis in her mother. Social History:  Patient  reports that she has never smoked. She has never used smokeless tobacco. She reports that she does not drink alcohol and does not use drugs.  Review of Systems: Constitutional: negative for fever or malaise Ophthalmic: negative for photophobia, double vision or loss of vision Cardiovascular: negative for chest pain, dyspnea on exertion, or new LE swelling Respiratory: negative for SOB or persistent cough Gastrointestinal: negative for abdominal pain, change in bowel habits or melena Genitourinary: negative for dysuria or gross hematuria, no abnormal uterine bleeding or disharge Musculoskeletal: negative for new gait disturbance or muscular weakness Integumentary: negative for new or persistent rashes, no breast lumps Neurological: negative for TIA or stroke symptoms Psychiatric: negative for SI or delusions Allergic/Immunologic: negative for hives  Patient Care Team    Relationship Specialty Notifications Start End  Irving,  Karie Fetch, MD PCP - General Family Medicine  06/14/17   Melvenia Beam, MD Consulting Physician Neurology  06/14/17   Paralee Cancel, MD Consulting Physician Orthopedic Surgery  06/14/17   [name], MD Consulting Physician Cardiology  06/14/17   Efrain Sella, MD Consulting Physician Gastroenterology  06/14/17     Objective  Vitals: BP 112/62   Pulse 80   Temp 98 F (36.7 C) (Temporal)   Resp  16   Ht 5\' 7"  (1.702 m)   Wt 206 lb 3.2 oz (93.5 kg)   SpO2 98%   BMI 32.30 kg/m  General:  Well developed, well nourished, no acute distress  Psych:  Alert and orientedx3,normal mood and affect but tearful HEENT:  Normocephalic, atraumatic, non-icteric sclera,  supple neck without adenopathy, mass or thyromegaly Cardiovascular: deferred Respiratory:  Good breath sounds bilaterally, CTAB with normal respiratory effort Gastrointestinal: normal bowel sounds, soft, non-tender, no noted masses. No HSM MSK: no deformities, contusions. Joints are without erythema or swelling.  Skin:  Warm, no rashes or suspicious lesions noted    Commons side effects, risks, benefits, and alternatives for medications and treatment plan prescribed today were discussed, and the patient expressed understanding of the given instructions. Patient is instructed to call or message via MyChart if he/she has any questions or concerns regarding our treatment plan. No barriers to understanding were identified. We discussed Red Flag symptoms and signs in detail. Patient expressed understanding regarding what to do in case of urgent or emergency type symptoms.   Medication list was reconciled, printed and provided to the patient in AVS. Patient instructions and summary information was reviewed with the patient as documented in the AVS. This note was prepared with assistance of Dragon voice recognition software. Occasional wrong-word or sound-a-like substitutions may have occurred due to the inherent limitations of voice recognition software  This visit occurred during the SARS-CoV-2 public health emergency.  Safety protocols were in place, including screening questions prior to the visit, additional usage of staff PPE, and extensive cleaning of exam room while observing appropriate contact time as indicated for disinfecting solutions.

## 2019-11-14 NOTE — Patient Instructions (Signed)
Please return in 12 months for your annual complete physical; please come fasting.   I will release your lab results to you on your MyChart account with further instructions. Please reply with any questions.    Today you were given your flu vaccination.    If you have any questions or concerns, please don't hesitate to send me a message via MyChart or call the office at [phone]. Thank you for visiting with us today! It's our pleasure caring for you.  

## 2019-11-15 ENCOUNTER — Other Ambulatory Visit: Payer: Self-pay | Admitting: Family Medicine

## 2019-11-15 ENCOUNTER — Telehealth: Payer: Self-pay

## 2019-11-15 LAB — LIPID PANEL
Cholesterol: 201 mg/dL — ABNORMAL HIGH (ref <200)
HDL: 90 mg/dL (ref >=50)
LDL Cholesterol (Calc): 93 mg/dL (calc)
Non-HDL Cholesterol (Calc): 111 mg/dL (calc) (ref <130)
Total CHOL/HDL Ratio: 2.2 (calc) (ref <5.0)
Triglycerides: 88 mg/dL (ref <150)

## 2019-11-15 LAB — HEPATIC FUNCTION PANEL
AG Ratio: 1.7 (calc) (ref 1.0–2.5)
ALT: 15 U/L (ref 6–29)
AST: 20 U/L (ref 10–35)
Albumin: 4.3 g/dL (ref 3.6–5.1)
Alkaline phosphatase (APISO): 96 U/L (ref 37–153)
Bilirubin, Direct: 0.1 mg/dL (ref 0.0–0.2)
Globulin: 2.5 g/dL (calc) (ref 1.9–3.7)
Indirect Bilirubin: 0.5 mg/dL (calc) (ref 0.2–1.2)
Total Bilirubin: 0.6 mg/dL (ref 0.2–1.2)
Total Protein: 6.8 g/dL (ref 6.1–8.1)

## 2019-11-15 LAB — TSH: TSH: 0.25 mIU/L — ABNORMAL LOW (ref 0.40–4.50)

## 2019-11-15 MED ORDER — LEVOTHYROXINE SODIUM 112 MCG PO TABS: 112.0000 ug | ORAL_TABLET | Freq: Every day | ORAL | 3 refills | Status: DC

## 2019-11-15 NOTE — Progress Notes (Signed)
Remote pacemaker transmission.   

## 2019-11-15 NOTE — Addendum Note (Signed)
Addended by: Cheri Kearns A on: 11/15/2019 09:59 AM   Modules accepted: Level of Service

## 2019-11-15 NOTE — Telephone Encounter (Signed)
Patient is returning Katie's call about lab results.

## 2019-11-18 ENCOUNTER — Ambulatory Visit (HOSPITAL_COMMUNITY): Payer: Medicare PPO | Attending: Cardiology

## 2019-11-18 ENCOUNTER — Other Ambulatory Visit: Payer: Self-pay

## 2019-11-18 DIAGNOSIS — I480 Paroxysmal atrial fibrillation: Secondary | ICD-10-CM

## 2019-11-18 DIAGNOSIS — I442 Atrioventricular block, complete: Secondary | ICD-10-CM | POA: Diagnosis not present

## 2019-11-18 DIAGNOSIS — I4892 Unspecified atrial flutter: Secondary | ICD-10-CM | POA: Insufficient documentation

## 2019-11-18 DIAGNOSIS — Z950 Presence of cardiac pacemaker: Secondary | ICD-10-CM | POA: Insufficient documentation

## 2019-11-18 LAB — ECHOCARDIOGRAM COMPLETE
Area-P 1/2: 3.91 cm2
S' Lateral: 2.8 cm

## 2019-11-19 NOTE — Telephone Encounter (Signed)
Patient given lab results. 

## 2019-11-20 ENCOUNTER — Other Ambulatory Visit: Payer: Self-pay

## 2019-11-20 ENCOUNTER — Ambulatory Visit
Admission: RE | Admit: 2019-11-20 | Discharge: 2019-11-20 | Disposition: A | Discharge status: Home / Self Care | Payer: Medicare PPO | Source: Ambulatory Visit | Attending: Family Medicine | Admitting: Family Medicine

## 2019-11-20 DIAGNOSIS — Z1231 Encounter for screening mammogram for malignant neoplasm of breast: Secondary | ICD-10-CM | POA: Diagnosis not present

## 2019-12-03 ENCOUNTER — Telehealth: Payer: Self-pay

## 2019-12-03 NOTE — Telephone Encounter (Signed)
error 

## 2019-12-12 ENCOUNTER — Ambulatory Visit (INDEPENDENT_AMBULATORY_CARE_PROVIDER_SITE_OTHER): Payer: Medicare PPO

## 2019-12-12 DIAGNOSIS — I442 Atrioventricular block, complete: Secondary | ICD-10-CM

## 2019-12-12 LAB — CUP PACEART REMOTE DEVICE CHECK
Battery Impedance: 6757 Ohm
Battery Remaining Longevity: 8 mo
Battery Voltage: 2.63 V
Brady Statistic RV Percent Paced: 0 %
Date Time Interrogation Session: 20211118104403
Implantable Lead Implant Date: 19960815
Implantable Lead Implant Date: 19960815
Implantable Lead Location: 753859
Implantable Lead Location: 753860
Implantable Lead Model: 5024
Implantable Pulse Generator Implant Date: 20090205
Lead Channel Impedance Value: 67 Ohm
Lead Channel Impedance Value: 761 Ohm
Lead Channel Pacing Threshold Amplitude: 1.375 V
Lead Channel Pacing Threshold Pulse Width: 0.4 ms
Lead Channel Setting Pacing Amplitude: 2.75 V
Lead Channel Setting Pacing Pulse Width: 0.4 ms
Lead Channel Setting Sensing Sensitivity: 2.8 mV

## 2019-12-13 NOTE — Progress Notes (Signed)
Remote pacemaker transmission.   

## 2019-12-20 ENCOUNTER — Other Ambulatory Visit: Payer: Self-pay | Admitting: Internal Medicine

## 2019-12-23 NOTE — Telephone Encounter (Signed)
Pt last saw Dr Rayann Heman 10/16/19, last labs 10/16/19 Creat 0.89, age 69, weight 93.5kg, based on specified criteria pt is on appropriate dosage of Eliquis 5mg  BID.  Will refill rx.

## 2019-12-24 ENCOUNTER — Other Ambulatory Visit: Payer: Self-pay | Admitting: Internal Medicine

## 2019-12-25 NOTE — Telephone Encounter (Signed)
rx refill

## 2020-01-01 DIAGNOSIS — M25552 Pain in left hip: Secondary | ICD-10-CM | POA: Diagnosis not present

## 2020-01-13 ENCOUNTER — Ambulatory Visit (INDEPENDENT_AMBULATORY_CARE_PROVIDER_SITE_OTHER): Payer: Medicare PPO

## 2020-01-13 DIAGNOSIS — I442 Atrioventricular block, complete: Secondary | ICD-10-CM

## 2020-01-14 ENCOUNTER — Ambulatory Visit: Payer: Medicare PPO | Admitting: Family Medicine

## 2020-01-14 LAB — CUP PACEART REMOTE DEVICE CHECK
Battery Impedance: 6954 Ohm
Battery Remaining Longevity: 7 mo
Battery Voltage: 2.64 V
Brady Statistic RV Percent Paced: 0 %
Date Time Interrogation Session: 20211220083107
Implantable Lead Implant Date: 19960815
Implantable Lead Implant Date: 19960815
Implantable Lead Location: 753859
Implantable Lead Location: 753860
Implantable Lead Model: 5024
Implantable Pulse Generator Implant Date: 20090205
Lead Channel Impedance Value: 67 Ohm
Lead Channel Impedance Value: 710 Ohm
Lead Channel Pacing Threshold Amplitude: 1.375 V
Lead Channel Pacing Threshold Pulse Width: 0.4 ms
Lead Channel Setting Pacing Amplitude: 2.75 V
Lead Channel Setting Pacing Pulse Width: 0.4 ms
Lead Channel Setting Sensing Sensitivity: 2.8 mV

## 2020-01-16 ENCOUNTER — Encounter: Payer: Self-pay | Admitting: Internal Medicine

## 2020-01-16 ENCOUNTER — Other Ambulatory Visit: Payer: Self-pay

## 2020-01-16 ENCOUNTER — Ambulatory Visit: Payer: Medicare PPO | Admitting: Internal Medicine

## 2020-01-16 VITALS — BP 122/68 | HR 67 | Ht 67.0 in | Wt 208.4 lb

## 2020-01-16 DIAGNOSIS — I442 Atrioventricular block, complete: Secondary | ICD-10-CM | POA: Diagnosis not present

## 2020-01-16 DIAGNOSIS — I480 Paroxysmal atrial fibrillation: Secondary | ICD-10-CM

## 2020-01-16 DIAGNOSIS — D6869 Other thrombophilia: Secondary | ICD-10-CM | POA: Diagnosis not present

## 2020-01-16 DIAGNOSIS — Z950 Presence of cardiac pacemaker: Secondary | ICD-10-CM | POA: Diagnosis not present

## 2020-01-16 NOTE — Patient Instructions (Addendum)
Medication Instructions:  Your physician recommends that you continue on your current medications as directed. Please refer to the Current Medication list given to you today.  Labwork: None ordered.  Testing/Procedures: None ordered.  Follow-Up: Your physician wants you to follow-up in: 6 months with Dr. Rayann Heman.    July 06, 2020 at 1:45 pm at the [facility] office   Remote monitoring is used to monitor your Pacemaker from home. This monitoring reduces the number of office visits required to check your device to one time per year. It allows [place] to keep an eye on the functioning of your device to ensure it is working properly. You are scheduled for a device check from home on 02/13/2020. You may send your transmission at any time that day. If you have a wireless device, the transmission will be sent automatically. After your physician reviews your transmission, you will receive a postcard with your next transmission date.  Any Other Special Instructions Will Be Listed Below (If Applicable).  If you need a refill on your cardiac medications before your next appointment, please call your pharmacy.

## 2020-01-16 NOTE — Progress Notes (Signed)
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 female who presents today for routine electrophysiology followup.  Since last being seen in our clinic, the patient reports doing very well.  Today, she denies symptoms of palpitations, chest pain, shortness of breath,  lower extremity edema, dizziness, presyncope, or syncope.  The patient is otherwise without complaint today.   Past Medical History:  Diagnosis Date  . AR (allergic rhinitis)   . Atrial fibrillation ([place])   . Atrial flutter ([place])   . Clotting disorder ([place])   . Diverticulosis   . GERD (gastroesophageal reflux disease)   . Hiatal hernia   . Hiatal hernia   . Hypothyroidism   . Migraines   . Mild sleep apnea   . Osteoarthritis   . Osteopenia after menopause 06/14/2017   DEXA 08/2016 [facility]: T = -1.10 radius, -1.0 femur; lumbar spine elevated due to DJD; recheck 2-3 years.  . Pacemaker    CHB  . Paroxysmal A-fib ([place])   . PUD (peptic ulcer disease)   . Sleep apnea   . Thyroid nodule   . Transient complete heart block 1996   Past Surgical History:  Procedure Laterality Date  . ATRIAL FIBRILLATION ABLATION N/A 01/08/2019   Procedure: ATRIAL FIBRILLATION ABLATION;  Surgeon: [name], MD;  Location: [place] [facility];  Service: Cardiovascular;  Laterality: N/A;  . ATRIAL FIBRILLATION ABLATION N/A 04/02/2019   Procedure: ATRIAL FIBRILLATION ABLATION;  Surgeon: [name], MD;  Location: [place] [facility];  Service: Cardiovascular;  Laterality: N/A;  . CARDIOVERSION N/A 01/28/2019   Procedure: CARDIOVERSION;  Surgeon: Donato Heinz, MD;  Location: [facility] ENDOSCOPY;  Service: Endoscopy;  Laterality: N/A;  . CARDIOVERSION N/A 02/27/2019   Procedure: CARDIOVERSION;  Surgeon: Donato Heinz, MD;  Location: [facility] ENDOSCOPY;  Service: Endoscopy;  Laterality: N/A;  . HIP ARTHROPLASTY Right 2008  . PACEMAKER GENERATOR CHANGE  03/01/2007   performed by Dr Rosita Fire at [facility])  . PACEMAKER INSERTION  09/08/1994   performed in [facility] for transient complete heart block  . THYROIDECTOMY, PARTIAL  1986   BENIGN NODULES  . TOTAL HIP ARTHROPLASTY Left 02/13/2018   Procedure: TOTAL HIP ARTHROPLASTY ANTERIOR APPROACH;  Surgeon: Paralee Cancel, MD;  Location: [facility] ORS;  Service: Orthopedics;  Laterality: Left;  70 mins  . TUBAL LIGATION  1987    ROS- all systems are reviewed and negative except as per HPI above  Current Outpatient Medications  Medication Sig Dispense Refill  . acetaminophen (TYLENOL) 500 MG tablet Take 1,000 mg by mouth every 6 (six) hours as needed (for headaches or mild pain).    . CALCIUM PO Take 400 mg by mouth daily.    . Cholecalciferol (VITAMIN D3) 25 MCG (1000 UT) CAPS Take 1,000 Units by mouth daily.    . Cyanocobalamin (B-12) 1000 MCG SUBL Place 1,000 mcg under the tongue every Friday.     . diclofenac sodium (VOLTAREN) 1 % GEL Apply 2 g topically 4 (four) times daily as needed. 100 g 5  . diltiazem (CARDIZEM CD) 120 MG 24 hr capsule TAKE 1 CAPSULE TWICE DAILY. MAY TAKE EXTRA CAPSULE DAILY FOR BREAKTHROUGH AFIB. 190 capsule 0  . diltiazem (CARDIZEM) 30 MG tablet Take 1 tablet every 4 hours AS NEEDED for AFIB heart rate >100 45 tablet 3  . dofetilide (TIKOSYN) 500 MCG capsule TAKE (1) CAPSULE TWICE DAILY. 180 capsule 3  . ELIQUIS  5 MG TABS tablet TAKE 1 TABLET BY MOUTH TWICE DAILY. 180 tablet 1  . levothyroxine (SYNTHROID) 112 MCG tablet Take 1 tablet (112 mcg total) by mouth daily. 90 tablet 3  . Magnesium 250 MG TABS Take 250 mg by mouth every evening.     . Potassium 99 MG TABS Take 99 mg by mouth daily.     . sodium chloride (OCEAN) 0.65 % SOLN nasal spray Place 1 spray into both nostrils as needed for congestion.     . traMADol (ULTRAM) 50 MG tablet Take 1 tablet (50 mg total) by mouth every 6 (six) hours as needed for moderate pain. 30 tablet 0   No current facility-administered medications for this visit.     Physical Exam: Vitals:   01/16/20 1352  BP: 122/68  Pulse: 67  SpO2: 99%  Weight: 208 lb 6.4 oz (94.5 kg)  Height: 5\' 7"  (1.702 m)    GEN- The patient is well appearing, alert and oriented x 3 today.   Head- normocephalic, atraumatic Eyes-  Sclera clear, conjunctiva pink Ears- hearing intact Oropharynx- clear Lungs- Clear to ausculation bilaterally, normal work of breathing Chest- pacemaker pocket is well healed Heart- Regular rate and rhythm, no murmurs, rubs or gallops, PMI not laterally displaced GI- soft, NT, ND, + BS Extremities- no clubbing, cyanosis, or edema  Echo 11/18/19- EF 60%, RV mildly enlarged, normal function, mild atrial enlargement, moderate TR  Pacemaker interrogation- reviewed in detail today,  See PACEART report  ekg tracing ordered today is personally reviewed and shows sinus, QTc 458 msec  Assessment and Plan:  1. Remote transient complete heart block without recurrence.  She does not V pace (0%).   Normal pacemaker function See Pace Art report No changes today  2. Paroxysmal atrial fibrillation/ atrial flutter Doing very well post ablation She is on tikosyn She may likely require long term AAD therapy We will follow closely on tikosyn to avoid toxicity chadsvasc score is 2. Continue eliquis Bmet, mg from 10/21 reviewed  3. Moderate TR Recent echo reviewed  Risks, benefits and potential toxicities for medications prescribed and/or refilled reviewed with patient today.   Return in 6 months  [name] MD, [facility] 01/16/2020 2:07 PM

## 2020-01-21 ENCOUNTER — Other Ambulatory Visit: Payer: Self-pay

## 2020-01-21 ENCOUNTER — Encounter: Payer: Self-pay | Admitting: Family Medicine

## 2020-01-21 ENCOUNTER — Ambulatory Visit: Payer: Medicare PPO | Admitting: Family Medicine

## 2020-01-21 VITALS — BP 124/70 | HR 71 | Temp 97.6°F | Resp 18 | Ht 67.0 in | Wt 208.8 lb

## 2020-01-21 DIAGNOSIS — E890 Postprocedural hypothyroidism: Secondary | ICD-10-CM | POA: Diagnosis not present

## 2020-01-21 LAB — TSH: TSH: 2.25 u[IU]/mL (ref 0.35–4.50)

## 2020-01-21 LAB — T4, FREE: Free T4: 0.98 ng/dL (ref 0.60–1.60)

## 2020-01-21 NOTE — Progress Notes (Signed)
Subjective  CC:  Chief Complaint  Patient presents with  . Thyroid Concerns    HPI: Kristin [name] is a 69 y.o. female who presents to the office today to address the problems listed above in the chief complaint.  . Hypothyroidism f/u: Kristin [name] is a 69 y.o. female who presents for follow up of hypothyroidism. Last TSH showed control was unsatisfactory with TSH being low, and thyroid supplement medication was adjusted accordingly.  We decreased dose from 178mcg to 116mcg daily.  Current symptoms: none and she is feeling better. sleeping better and less fatigued . Patient denies change in energy level, diarrhea, heat / cold intolerance, nervousness, palpitations and weight changes. Symptoms have stabilized.She has been compliant with the medication. Due for lab recheck.   Assessment  1. Hypothyroidism, postsurgical      Plan   Hypothyroidism:  recheck labs today and adjust meds accordingly. Pt is clinicall euthyroid.  Follow up: October for cpe , 2022    Orders Placed This Encounter  Procedures  . TSH  . T4, free  . T3   No orders of the defined types were placed in this encounter.     I reviewed the patients updated PMH, FH, and SocHx.    Patient Active Problem List   Diagnosis Date Noted  . Complete heart block ([place]) 10/06/2015    Priority: High  . Long term current use of anticoagulant 08/05/2015    Priority: High  . Pacemaker 10/14/2014    Priority: High  . Paroxysmal atrial fibrillation ([place]) 10/14/2014    Priority: High  . Hypothyroidism, postsurgical 01/08/2013    Priority: High  . OSA (obstructive sleep apnea) 10/20/2017    Priority: Medium    Class: Chronic  . Osteopenia after menopause 06/14/2017    Priority: Medium  . Osteoarthritis of left hip 05/11/2017    Priority: Medium  . GERD (gastroesophageal reflux disease) 01/08/2013    Priority: Medium  . OA (osteoarthritis) 01/08/2013    Priority: Medium  . History of peptic ulcer disease - nsaid  01/08/2013    Priority: Medium  . Diverticulosis of large intestine without hemorrhage 01/04/2016    Priority: Low  . Internal hemorrhoids without complication [id number]    Priority: Low  . AR (allergic rhinitis) 01/08/2013    Priority: Low  . Atrial flutter ([place])   . Overweight (BMI 25.0-29.9) 02/14/2018  . S/P left THA, AA 02/13/2018   Current Meds  Medication Sig  . acetaminophen (TYLENOL) 500 MG tablet Take 1,000 mg by mouth every 6 (six) hours as needed (for headaches or mild pain).  . CALCIUM PO Take 400 mg by mouth daily.  . Cholecalciferol (VITAMIN D3) 25 MCG (1000 UT) CAPS Take 1,000 Units by mouth daily.  . Cyanocobalamin (B-12) 1000 MCG SUBL Place 1,000 mcg under the tongue every Friday.   . diclofenac sodium (VOLTAREN) 1 % GEL Apply 2 g topically 4 (four) times daily as needed.  . diltiazem (CARDIZEM CD) 120 MG 24 hr capsule TAKE 1 CAPSULE TWICE DAILY. MAY TAKE EXTRA CAPSULE DAILY FOR BREAKTHROUGH AFIB.  Marland Kitchen diltiazem (CARDIZEM) 30 MG tablet Take 1 tablet every 4 hours AS NEEDED for AFIB heart rate >100  . dofetilide (TIKOSYN) 500 MCG capsule TAKE (1) CAPSULE TWICE DAILY.  Marland Kitchen ELIQUIS 5 MG TABS tablet TAKE 1 TABLET BY MOUTH TWICE DAILY.  Marland Kitchen levothyroxine (SYNTHROID) 112 MCG tablet Take 1 tablet (112 mcg total) by mouth daily.  . Magnesium 250 MG TABS Take 250 mg  by mouth every evening.   . Potassium 99 MG TABS Take 99 mg by mouth daily.   . sodium chloride (OCEAN) 0.65 % SOLN nasal spray Place 1 spray into both nostrils as needed for congestion.   . traMADol (ULTRAM) 50 MG tablet Take 1 tablet (50 mg total) by mouth every 6 (six) hours as needed for moderate pain.    Allergies: Patient is allergic to morphine and related, neomycin, and adhesive [tape]. Family History: Patient family history includes COPD in her brother; Cancer in her maternal grandmother; Diabetes Mellitus II in her maternal uncle; Drug abuse in her brother; Peptic Ulcer in her mother; Rheum arthritis in her  mother. Social History:  Patient  reports that she has never smoked. She has never used smokeless tobacco. She reports that she does not drink alcohol and does not use drugs.  Review of Systems: Constitutional: Negative for fever malaise or anorexia Cardiovascular: negative for chest pain Respiratory: negative for SOB or persistent cough Gastrointestinal: negative for abdominal pain  Objective  Vitals: BP 124/70   Pulse 71   Temp 97.6 F (36.4 C) (Temporal)   Resp 18   Ht 5\' 7"  (1.702 m)   Wt 208 lb 12.8 oz (94.7 kg)   SpO2 96%   BMI 32.70 kg/m  General: no acute distress , A&Ox3 HEENT: [place], no proptosis or lid lag, conjunctiva normal,neck is supple without goiter or thyromegaly or thyroid nodules Cardiovascular:  RRR without murmur or gallop. No peripheral edema Respiratory:  Good breath sounds bilaterally, CTAB with normal respiratory effort Neuro: no tremor     Commons side effects, risks, benefits, and alternatives for medications and treatment plan prescribed today were discussed, and the patient expressed understanding of the given instructions. Patient is instructed to call or message via MyChart if he/she has any questions or concerns regarding our treatment plan. No barriers to understanding were identified. We discussed Red Flag symptoms and signs in detail. Patient expressed understanding regarding what to do in case of urgent or emergency type symptoms.   Medication list was reconciled, printed and provided to the patient in AVS. Patient instructions and summary information was reviewed with the patient as documented in the AVS. This note was prepared with assistance of Dragon voice recognition software. Occasional wrong-word or sound-a-like substitutions may have occurred due to the inherent limitations of voice recognition software

## 2020-01-21 NOTE — Patient Instructions (Signed)
Please return in October 2022 for your annual complete physical; please come fasting. Sooner if you have any problems.   I will release your lab results to you on your MyChart account with further instructions. Please reply with any questions.   If you have any questions or concerns, please don't hesitate to send me a message via MyChart or call the office at [phone]. Thank you for visiting with Kristin [name] today! It's our pleasure caring for you.

## 2020-01-22 LAB — T3: T3, Total: 86 ng/dL (ref 76–181)

## 2020-01-27 NOTE — Addendum Note (Signed)
Addended by: Geralyn Flash D on: 01/27/2020 01:51 PM   Modules accepted: Level of Service

## 2020-01-27 NOTE — Progress Notes (Signed)
Remote pacemaker transmission.   

## 2020-02-13 ENCOUNTER — Ambulatory Visit (INDEPENDENT_AMBULATORY_CARE_PROVIDER_SITE_OTHER): Payer: Medicare PPO

## 2020-02-13 DIAGNOSIS — I442 Atrioventricular block, complete: Secondary | ICD-10-CM

## 2020-02-13 LAB — CUP PACEART REMOTE DEVICE CHECK
Battery Impedance: 7388 Ohm
Battery Remaining Longevity: 5 mo
Battery Voltage: 2.64 V
Brady Statistic RV Percent Paced: 0 %
Date Time Interrogation Session: 20220120061119
Implantable Lead Implant Date: 19960815
Implantable Lead Implant Date: 19960815
Implantable Lead Location: 753859
Implantable Lead Location: 753860
Implantable Lead Model: 5024
Implantable Pulse Generator Implant Date: 20090205
Lead Channel Impedance Value: 67 Ohm
Lead Channel Impedance Value: 770 Ohm
Lead Channel Pacing Threshold Amplitude: 1.125 V
Lead Channel Pacing Threshold Pulse Width: 0.4 ms
Lead Channel Setting Pacing Amplitude: 2.5 V
Lead Channel Setting Pacing Pulse Width: 0.4 ms
Lead Channel Setting Sensing Sensitivity: 2.8 mV

## 2020-02-26 NOTE — Progress Notes (Signed)
Remote pacemaker transmission.   

## 2020-03-16 ENCOUNTER — Ambulatory Visit (INDEPENDENT_AMBULATORY_CARE_PROVIDER_SITE_OTHER): Payer: Medicare PPO

## 2020-03-16 DIAGNOSIS — I442 Atrioventricular block, complete: Secondary | ICD-10-CM | POA: Diagnosis not present

## 2020-03-16 LAB — CUP PACEART REMOTE DEVICE CHECK
Battery Impedance: 8228 Ohm
Battery Remaining Longevity: 3 mo
Battery Voltage: 2.63 V
Brady Statistic RV Percent Paced: 0 %
Date Time Interrogation Session: 20220221081639
Implantable Lead Implant Date: 19960815
Implantable Lead Implant Date: 19960815
Implantable Lead Location: 753859
Implantable Lead Location: 753860
Implantable Lead Model: 5024
Implantable Pulse Generator Implant Date: 20090205
Lead Channel Impedance Value: 67 Ohm
Lead Channel Impedance Value: 827 Ohm
Lead Channel Pacing Threshold Amplitude: 1.125 V
Lead Channel Pacing Threshold Pulse Width: 0.4 ms
Lead Channel Setting Pacing Amplitude: 2.5 V
Lead Channel Setting Pacing Pulse Width: 0.4 ms
Lead Channel Setting Sensing Sensitivity: 2.8 mV

## 2020-03-20 ENCOUNTER — Other Ambulatory Visit: Payer: Self-pay

## 2020-03-20 MED ORDER — DILTIAZEM HCL ER COATED BEADS 120 MG PO CP24: ORAL_CAPSULE | ORAL | 3 refills | Status: DC

## 2020-03-24 NOTE — Progress Notes (Signed)
Remote pacemaker transmission.   

## 2020-04-16 ENCOUNTER — Ambulatory Visit (INDEPENDENT_AMBULATORY_CARE_PROVIDER_SITE_OTHER): Payer: Medicare PPO

## 2020-04-16 DIAGNOSIS — I442 Atrioventricular block, complete: Secondary | ICD-10-CM

## 2020-04-17 ENCOUNTER — Telehealth: Payer: Self-pay | Admitting: Emergency Medicine

## 2020-04-17 LAB — CUP PACEART REMOTE DEVICE CHECK
Battery Impedance: 8832 Ohm
Battery Voltage: 2.65 V
Brady Statistic RV Percent Paced: 0 %
Date Time Interrogation Session: 20220324103426
Implantable Lead Implant Date: 19960815
Implantable Lead Implant Date: 19960815
Implantable Lead Location: 753859
Implantable Lead Location: 753860
Implantable Lead Model: 5024
Implantable Pulse Generator Implant Date: 20090205
Lead Channel Impedance Value: 67 Ohm
Lead Channel Impedance Value: 701 Ohm
Lead Channel Setting Pacing Amplitude: 2.75 V
Lead Channel Setting Pacing Pulse Width: 0.4 ms
Lead Channel Setting Sensing Sensitivity: 2.8 mV

## 2020-04-17 NOTE — Telephone Encounter (Signed)
Attempted to contact patient. Message left on patient's voicemail to contact the [facility] [phone]. Attempted to contact and notify patient, that her device met RRT on 03/31/2020. Message will be sent to scheduling 04/17/2020 to set up a GEN change consult for patient. Patient has monthly battery checks scheduled.

## 2020-04-17 NOTE — Telephone Encounter (Signed)
Patient returned call. Patient met RRT on 03/31/2020. Patient advised that during last visit with Dr. Rayann Heman that Gen change was discussed. I do not see anything in the last visit notes addressing Gen change. Patient said that if she needed to come in for a consult she would or that she would be fine with just having a Gen change scheduled. I advised patient that I would relay information to Dr. Rayann Heman and Otila Kluver RN and, that someone would be contacting her about scheduling in reference to a consult appointment or a Gen change being scheduled.

## 2020-04-27 ENCOUNTER — Other Ambulatory Visit: Payer: Self-pay

## 2020-04-27 ENCOUNTER — Ambulatory Visit: Payer: Medicare PPO | Admitting: Internal Medicine

## 2020-04-27 ENCOUNTER — Encounter: Payer: Self-pay | Admitting: Internal Medicine

## 2020-04-27 ENCOUNTER — Encounter: Payer: Self-pay | Admitting: *Deleted

## 2020-04-27 VITALS — BP 104/70 | HR 81 | Ht 67.0 in | Wt 206.2 lb

## 2020-04-27 DIAGNOSIS — I4819 Other persistent atrial fibrillation: Secondary | ICD-10-CM

## 2020-04-27 DIAGNOSIS — I442 Atrioventricular block, complete: Secondary | ICD-10-CM

## 2020-04-27 DIAGNOSIS — I483 Typical atrial flutter: Secondary | ICD-10-CM | POA: Diagnosis not present

## 2020-04-27 NOTE — Progress Notes (Signed)
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  Since last being seen in our clinic, the patient reports doing very well.  Today, she denies symptoms of palpitations, chest pain, shortness of breath,  lower extremity edema, dizziness, presyncope, or syncope.  The patient is otherwise without complaint today.   Past Medical History:  Diagnosis Date  . AR (allergic rhinitis)   . Atrial fibrillation ([place])   . Atrial flutter ([place])   . Clotting disorder ([place])   . Diverticulosis   . GERD (gastroesophageal reflux disease)   . Hiatal hernia   . Hiatal hernia   . Hypothyroidism   . Migraines   . Mild sleep apnea   . Osteoarthritis   . Osteopenia after menopause 06/14/2017   DEXA 08/2016 [facility]: T = -1.10 radius, -1.0 femur; lumbar spine elevated due to DJD; recheck 2-3 years.  . Pacemaker    CHB  . Paroxysmal A-fib ([place])   . PUD (peptic ulcer disease)   . Sleep apnea   . Thyroid nodule   . Transient complete heart block 1996   Past Surgical History:  Procedure Laterality Date  . ATRIAL FIBRILLATION ABLATION N/A 01/08/2019   Procedure: ATRIAL FIBRILLATION ABLATION;  Surgeon: [name], MD;  Location: [place] [facility];  Service: Cardiovascular;  Laterality: N/A;  . ATRIAL FIBRILLATION ABLATION N/A 04/02/2019   Procedure: ATRIAL FIBRILLATION ABLATION;  Surgeon: [name], MD;  Location: [place] [facility];  Service: Cardiovascular;  Laterality: N/A;  . CARDIOVERSION N/A 01/28/2019   Procedure: CARDIOVERSION;  Surgeon: Donato Heinz, MD;  Location: [facility] ENDOSCOPY;  Service: Endoscopy;  Laterality: N/A;  . CARDIOVERSION N/A 02/27/2019   Procedure: CARDIOVERSION;  Surgeon: Donato Heinz, MD;  Location: [facility] ENDOSCOPY;  Service: Endoscopy;  Laterality: N/A;  . HIP ARTHROPLASTY Right 2008  . PACEMAKER GENERATOR CHANGE  03/01/2007   performed by Dr Rosita [facility] at [facility])  . PACEMAKER INSERTION  09/08/1994   performed in [facility] for transient complete heart block  . THYROIDECTOMY, PARTIAL  1986   BENIGN NODULES  . TOTAL HIP ARTHROPLASTY Left 02/13/2018   Procedure: TOTAL HIP ARTHROPLASTY ANTERIOR APPROACH;  Surgeon: Paralee Cancel, MD;  Location: [facility];  Service: Orthopedics;  Laterality: Left;  70 mins  . TUBAL LIGATION  1987    ROS- all systems are reviewed and negative except as per HPI above  Current Outpatient Medications  Medication Sig Dispense Refill  . acetaminophen (TYLENOL) 500 MG tablet Take 1,000 mg by mouth every 6 (six) hours as needed (for headaches or mild pain).    . CALCIUM PO Take 400 mg by mouth daily.    . Cholecalciferol (VITAMIN D3) 25 MCG (1000 UT) CAPS Take 1,000 Units by mouth daily.    . Cyanocobalamin (B-12) 1000 MCG SUBL Place 1,000 mcg under the tongue every Friday.     . diclofenac sodium (VOLTAREN) 1 % GEL Apply 2 g topically 4 (four) times daily as needed. 100 g 5  . diltiazem (CARDIZEM CD) 120 MG 24 hr capsule TAKE 1 CAPSULE TWICE DAILY. MAY TAKE EXTRA CAPSULE DAILY FOR BREAKTHROUGH AFIB. 225 capsule 3  . diltiazem (CARDIZEM) 30 MG tablet Take 1 tablet every 4 hours AS NEEDED for AFIB heart rate >100 45 tablet 3  . dofetilide (TIKOSYN) 500 MCG capsule TAKE (1) CAPSULE TWICE DAILY. 180 capsule 3  . ELIQUIS  5 MG TABS tablet TAKE 1 TABLET BY MOUTH TWICE DAILY. 180 tablet 1  . levothyroxine (SYNTHROID) 112 MCG tablet Take 1 tablet (112 mcg total) by mouth daily. 90 tablet 3  . Magnesium 250 MG TABS Take 250 mg by mouth every evening.     . Potassium 99 MG TABS Take 99 mg by mouth daily.     . sodium chloride (OCEAN) 0.65 % SOLN nasal spray Place 1 spray into both nostrils as needed for congestion.     . traMADol (ULTRAM) 50 MG tablet Take 1 tablet (50 mg total) by mouth every 6 (six) hours as needed for moderate pain. 30 tablet 0   No current facility-administered medications for this visit.     Physical Exam: Vitals:   04/27/20 0843  BP: 104/70  Pulse: 81  SpO2: 97%  Weight: 206 lb 3.2 oz (93.5 kg)  Height: 5\' 7"  (1.702 m)    GEN- The patient is well appearing, alert and oriented x 3 today.   Head- normocephalic, atraumatic Eyes-  Sclera clear, conjunctiva pink Ears- hearing intact Oropharynx- clear Lungs-   normal work of breathing Chest- pacemaker pocket is well healed Heart- Regular rate and rhythm  GI- soft,  Extremities- no clubbing, cyanosis, or edema  Pacemaker interrogation- reviewed in detail today,  See PACEART report  ekg tracing ordered today is personally reviewed and shows sinus rhythm 81 bpm, PR 158 msec, QRS 96 msec, QTc 476 msec  Assessment and Plan:  1. Symptomatic transient complete heart block Normal pacemaker function See Pace Art report No changes today she is not device dependant today She has reached ERI. We discussed options of device replacement vs not.  She is clear that she would like to proceed with generator change. Risks, benefits, and alternatives to PPM pulse generator replacement were discussed in detail today.  The patient understands that risks include but are not limited to bleeding, infection, pneumothorax, perforation, tamponade, vascular damage, renal failure, MI, stroke, death,  damage to his existing leads, and lead dislodgement and wishes to proceed.  We will therefore schedule the procedure at the next available time. Hold eliquis 24 hours prior to generator change  2. Persistent afib with typical and atypical atrial flutter Doing well s/p ablation despite severe atrial enlargement She is on tikosyn We will follow her closely on tikosyn to avoid toxicity She is on eliquis for chads2vasc score of 2  Risks, benefits and potential toxicities for medications prescribed and/or refilled reviewed with patient today.   [name] MD, [facility] 04/27/2020 8:51 AM

## 2020-04-27 NOTE — Patient Instructions (Signed)
Medication Instructions:  Your physician recommends that you continue on your current medications as directed. Please refer to the Current Medication list given to you today.  Labwork: None ordered.  Testing/Procedures: Your physician has recommended that you have a pacemaker battery change. A pacemaker is a small device that is placed under the skin of your chest or abdomen to help control abnormal heart rhythms. This device uses electrical pulses to prompt the heart to beat at a normal rate. Pacemakers are used to treat heart rhythms that are too slow. Wire (leads) are attached to the pacemaker that goes into the chambers of you heart. This is done in the hospital and usually requires and overnight stay. Please see the instruction sheet given to you today for more information.   Any Other Special Instructions Will Be Listed Below (If Applicable).  If you need a refill on your cardiac medications before your next appointment, please call your pharmacy.      [employer] (26th ed., pp. 397-673). [place]: Elsevier."> Clinical Cardiac Pacing, Defibrillation and Resynchronization Therapy (5th ed., pp. 251-269). [place]: Elsevier.">  Pacemaker Battery Change  A pacemaker battery usually lasts 5-15 years (6-7 years on average). A few times a year, you may be asked to visit your health care provider to have a full evaluation of your pacemaker. When the battery is low, your pacemaker will be completely replaced. Most often, this procedure is simpler than the first surgery because the wires (leads) that connect the pacemaker to the heart are already in place. There are many things that affect how long a pacemaker battery will last, including:  The age of the pacemaker.  The number of leads you have(1, 2, or 3).  The use or workload of the pacemaker. If the pacemaker is helping the heart more often, the battery will not last as long.  Power (voltage) settings. Tell a  health care provider about:  Any allergies you have.  All medicines you are taking, including vitamins, herbs, eye drops, creams, and over-the-counter medicines.  Any problems you or family members have had with anesthetic medicines.  Any blood disorders you have.  Any surgeries you have had, especially any surgeries you have had since your last pacemaker was placed.  Any medical conditions you have.  Whether you are pregnant or may be pregnant. What are the risks? Generally, this is a safe procedure. However, problems may occur, including:  Bleeding.  Infection.  Nerve damage.  Allergic reaction to medicines.  Damage to the leads that go to the heart. What happens before the procedure? Staying hydrated Follow instructions from your health care provider about hydration, which may include:  Up to 2 hours before the procedure - you may continue to drink clear liquids, such as water, clear fruit juice, black coffee, and plain tea. Eating and drinking restrictions Follow instructions from your health care provider about eating and drinking restrictions, which may include:  8 hours before the procedure - stop eating heavy meals or foods, such as meat, fried foods, or fatty foods.  6 hours before the procedure - stop eating light meals or foods, such as toast or cereal.  6 hours before the procedure - stop drinking milk or drinks that contain milk.  2 hours before the procedure - stop drinking clear liquids. Medicines Ask your health care provider about:  Changing or stopping your regular medicines. This is especially important if you are taking diabetes medicines or blood thinners.  Taking medicines such as aspirin  and ibuprofen. These medicines can thin your blood. Do not take these medicines unless your health care provider tells you to take them.  Taking over-the-counter medicines, vitamins, herbs, and supplements. General instructions  Ask your health care provider  what steps will be taken to help prevent infection. These may include: ? Removing hair at the surgery site. ? Washing skin with a germ-killing soap. ? Receiving antibiotic medicine.  Plan to have someone take you home from the hospital or clinic.  If you will be going home right after the procedure, plan to have someone with you for 24 hours. What happens during the procedure?  An IV will be inserted into one of your veins.  You will be given one or more of the following: ? A medicine to help you relax (sedative). ? A medicine to numb the area where the pacemaker is located (local anesthetic).  Your health care provider will make an incision to reopen the pocket holding the pacemaker.  The old pacemaker will be disconnected from the leads.  The leads will be tested.  If needed, the leads will be replaced. If the leads are functioning properly, the new pacemaker will be connected to the existing leads.  A heart monitor and a pacemaker programmer will be used to make sure that the newly implanted pacemaker is working properly.  The incision site will be closed with stitches (sutures), adhesive strips, or skin glue.  A bandage (dressing) will be placed over the pacemaker site. The procedure may vary among health care providers and hospitals. What happens after the procedure?  Your blood pressure, heart rate, breathing rate, and blood oxygen level will be monitored until you leave the hospital or clinic.  You may be given antibiotics.  Your health care provider will tell you when your pacemaker will need to be tested again, or when to return to the office for removal of the dressing and sutures.  If you were given a sedative during the procedure, it can affect you for several hours. Do not drive or operate machinery until your health care provider says that it is safe.  You will be given a pacemaker identification card. This card lists the implant date, device model, and  manufacturer of your pacemaker. Summary  A pacemaker battery usually lasts 5-15 years (6-7 years on average).  When the battery is low, your pacemaker will need to be replaced.  Most often, this procedure is simpler than the first surgery because the wires (leads) that connect the pacemaker to the heart are already in place.  Risks of this procedure include bleeding, infection, and allergic reactions to medicines. This information is not intended to replace advice given to you by your health care provider. Make sure you discuss any questions you have with your health care provider. Document Revised: 12/13/2018 Document Reviewed: 12/13/2018 Elsevier Patient Education  2021 [employer].

## 2020-04-28 NOTE — Addendum Note (Signed)
Addended by: Cheri Kearns A on: 04/28/2020 10:56 AM   Modules accepted: Level of Service

## 2020-04-28 NOTE — Progress Notes (Signed)
Remote pacemaker transmission.   

## 2020-05-18 ENCOUNTER — Ambulatory Visit (INDEPENDENT_AMBULATORY_CARE_PROVIDER_SITE_OTHER): Payer: Medicare PPO

## 2020-05-18 DIAGNOSIS — I442 Atrioventricular block, complete: Secondary | ICD-10-CM

## 2020-05-19 ENCOUNTER — Other Ambulatory Visit: Payer: Medicare PPO | Admitting: *Deleted

## 2020-05-19 ENCOUNTER — Other Ambulatory Visit: Payer: Self-pay

## 2020-05-19 DIAGNOSIS — I4819 Other persistent atrial fibrillation: Secondary | ICD-10-CM | POA: Diagnosis not present

## 2020-05-19 DIAGNOSIS — I442 Atrioventricular block, complete: Secondary | ICD-10-CM | POA: Diagnosis not present

## 2020-05-19 DIAGNOSIS — I483 Typical atrial flutter: Secondary | ICD-10-CM

## 2020-05-19 LAB — CUP PACEART REMOTE DEVICE CHECK
Battery Impedance: 8900 Ohm
Battery Voltage: 2.64 V
Brady Statistic RV Percent Paced: 0 %
Date Time Interrogation Session: 20220425112748
Implantable Lead Implant Date: 19960815
Implantable Lead Implant Date: 19960815
Implantable Lead Location: 753859
Implantable Lead Location: 753860
Implantable Lead Model: 5024
Implantable Pulse Generator Implant Date: 20090205
Lead Channel Impedance Value: 67 Ohm
Lead Channel Impedance Value: 717 Ohm
Lead Channel Setting Pacing Amplitude: 2.75 V
Lead Channel Setting Pacing Pulse Width: 0.4 ms
Lead Channel Setting Sensing Sensitivity: 2.8 mV

## 2020-05-20 ENCOUNTER — Other Ambulatory Visit: Payer: Self-pay | Admitting: *Deleted

## 2020-05-20 DIAGNOSIS — E875 Hyperkalemia: Secondary | ICD-10-CM

## 2020-05-20 LAB — CBC WITH DIFFERENTIAL/PLATELET
Basophils Absolute: 0.1 10*3/uL (ref 0.0–0.2)
Basos: 1 %
EOS (ABSOLUTE): 0.2 10*3/uL (ref 0.0–0.4)
Eos: 4 %
Hematocrit: 36.4 % (ref 34.0–46.6)
Hemoglobin: 12.4 g/dL (ref 11.1–15.9)
Immature Grans (Abs): 0 10*3/uL (ref 0.0–0.1)
Immature Granulocytes: 0 %
Lymphocytes Absolute: 1.3 10*3/uL (ref 0.7–3.1)
Lymphs: 25 %
MCH: 32 pg (ref 26.6–33.0)
MCHC: 34.1 g/dL (ref 31.5–35.7)
MCV: 94 fL (ref 79–97)
Monocytes Absolute: 0.5 10*3/uL (ref 0.1–0.9)
Monocytes: 9 %
Neutrophils Absolute: 3.3 10*3/uL (ref 1.4–7.0)
Neutrophils: 61 %
Platelets: 244 10*3/uL (ref 150–450)
RBC: 3.88 x10E6/uL (ref 3.77–5.28)
RDW: 12.9 % (ref 11.7–15.4)
WBC: 5.4 10*3/uL (ref 3.4–10.8)

## 2020-05-20 LAB — BASIC METABOLIC PANEL
BUN/Creatinine Ratio: 21 (ref 12–28)
BUN: 19 mg/dL (ref 8–27)
CO2: 25 mmol/L (ref 20–29)
Calcium: 9.8 mg/dL (ref 8.7–10.3)
Chloride: 101 mmol/L (ref 96–106)
Creatinine, Ser: 0.9 mg/dL (ref 0.57–1.00)
Glucose: 99 mg/dL (ref 65–99)
Potassium: 5.5 mmol/L — ABNORMAL HIGH (ref 3.5–5.2)
Sodium: 138 mmol/L (ref 134–144)
eGFR: 70 mL/min/{1.73_m2} (ref >59)

## 2020-05-20 LAB — MAGNESIUM: Magnesium: 2.1 mg/dL (ref 1.6–2.3)

## 2020-05-25 ENCOUNTER — Other Ambulatory Visit: Payer: Medicare PPO | Admitting: *Deleted

## 2020-05-25 ENCOUNTER — Other Ambulatory Visit: Payer: Self-pay

## 2020-05-25 DIAGNOSIS — E875 Hyperkalemia: Secondary | ICD-10-CM

## 2020-05-26 ENCOUNTER — Telehealth: Payer: Self-pay

## 2020-05-26 DIAGNOSIS — E875 Hyperkalemia: Secondary | ICD-10-CM

## 2020-05-26 LAB — BASIC METABOLIC PANEL
BUN/Creatinine Ratio: 22 (ref 12–28)
BUN: 22 mg/dL (ref 8–27)
CO2: 23 mmol/L (ref 20–29)
Calcium: 9.4 mg/dL (ref 8.7–10.3)
Chloride: 103 mmol/L (ref 96–106)
Creatinine, Ser: 1.01 mg/dL — ABNORMAL HIGH (ref 0.57–1.00)
Glucose: 95 mg/dL (ref 65–99)
Potassium: 4.6 mmol/L (ref 3.5–5.2)
Sodium: 142 mmol/L (ref 134–144)
eGFR: 61 mL/min/{1.73_m2} (ref >59)

## 2020-05-26 NOTE — Telephone Encounter (Signed)
The patient has been notified of the result and verbalized understanding.  All questions (if any) were answered. Antonieta Iba, RN 05/26/2020 8:16 AM  Repeat labs have been scheduled.

## 2020-05-26 NOTE — Telephone Encounter (Signed)
-----   Message from Shirley Friar, PA-C sent at 05/26/2020  7:21 AM EDT ----- Would stay off low dose K for now and recheck BMET in 2 weeks to make sure she doesn't need to go back on.

## 2020-06-02 ENCOUNTER — Other Ambulatory Visit (HOSPITAL_COMMUNITY)
Admission: RE | Admit: 2020-06-02 | Discharge: 2020-06-02 | Disposition: A | Discharge status: Home / Self Care | Payer: Medicare PPO | Source: Ambulatory Visit | Attending: Internal Medicine | Admitting: Internal Medicine

## 2020-06-02 DIAGNOSIS — Z01812 Encounter for preprocedural laboratory examination: Secondary | ICD-10-CM | POA: Diagnosis not present

## 2020-06-02 DIAGNOSIS — Z20822 Contact with and (suspected) exposure to covid-19: Secondary | ICD-10-CM | POA: Insufficient documentation

## 2020-06-02 LAB — SARS CORONAVIRUS 2 (TAT 6-24 HRS): SARS Coronavirus 2: NEGATIVE

## 2020-06-04 ENCOUNTER — Other Ambulatory Visit: Payer: Self-pay

## 2020-06-04 ENCOUNTER — Encounter (HOSPITAL_COMMUNITY)
Admission: RE | Disposition: A | Discharge status: Home / Self Care | Payer: Self-pay | Source: Ambulatory Visit | Attending: Internal Medicine

## 2020-06-04 ENCOUNTER — Ambulatory Visit (HOSPITAL_COMMUNITY)
Admission: RE | Admit: 2020-06-04 | Discharge: 2020-06-04 | Disposition: A | Discharge status: Home / Self Care | Payer: Medicare PPO | Source: Ambulatory Visit | Attending: Internal Medicine | Admitting: Internal Medicine

## 2020-06-04 DIAGNOSIS — Z79899 Other long term (current) drug therapy: Secondary | ICD-10-CM | POA: Diagnosis not present

## 2020-06-04 DIAGNOSIS — Z45010 Encounter for checking and testing of cardiac pacemaker pulse generator [battery]: Secondary | ICD-10-CM | POA: Diagnosis not present

## 2020-06-04 DIAGNOSIS — Z79890 Hormone replacement therapy (postmenopausal): Secondary | ICD-10-CM | POA: Diagnosis not present

## 2020-06-04 DIAGNOSIS — Z7901 Long term (current) use of anticoagulants: Secondary | ICD-10-CM | POA: Diagnosis not present

## 2020-06-04 DIAGNOSIS — I441 Atrioventricular block, second degree: Secondary | ICD-10-CM | POA: Diagnosis not present

## 2020-06-04 HISTORY — PX: PPM GENERATOR CHANGEOUT: EP1233

## 2020-06-04 SURGERY — PPM GENERATOR CHANGEOUT

## 2020-06-04 MED ORDER — SODIUM CHLORIDE 0.9 % IV SOLN: INTRAVENOUS | Status: DC

## 2020-06-04 MED ORDER — CHLORHEXIDINE GLUCONATE 4 % EX LIQD: 4.0000 "application " | Freq: Once | CUTANEOUS | Status: DC

## 2020-06-04 MED ORDER — MIDAZOLAM HCL 5 MG/5ML IJ SOLN
INTRAMUSCULAR | Status: DC | PRN
  Administered 2020-06-04: 1 mg via INTRAVENOUS

## 2020-06-04 MED ORDER — CEFAZOLIN SODIUM-DEXTROSE 2-4 GM/100ML-% IV SOLN
INTRAVENOUS | Status: AC
  Filled 2020-06-04: qty 100

## 2020-06-04 MED ORDER — SODIUM CHLORIDE 0.9 % IV SOLN: 250.0000 mL | INTRAVENOUS | Status: DC | PRN

## 2020-06-04 MED ORDER — POVIDONE-IODINE 10 % EX SWAB: 2.0000 "application " | Freq: Once | CUTANEOUS | Status: DC

## 2020-06-04 MED ORDER — ACETAMINOPHEN 325 MG PO TABS
325.0000 mg | ORAL_TABLET | ORAL | Status: DC | PRN
Start: 2020-06-04 — End: 2020-06-04

## 2020-06-04 MED ORDER — LIDOCAINE HCL (PF) 1 % IJ SOLN
INTRAMUSCULAR | Status: DC | PRN
  Administered 2020-06-04: 50 mL

## 2020-06-04 MED ORDER — ONDANSETRON HCL 4 MG/2ML IJ SOLN: 4.0000 mg | Freq: Four times a day (QID) | INTRAMUSCULAR | Status: DC | PRN

## 2020-06-04 MED ORDER — SODIUM CHLORIDE 0.9 % IV SOLN
INTRAVENOUS | Status: AC
  Filled 2020-06-04: qty 2

## 2020-06-04 MED ORDER — SODIUM CHLORIDE 0.9% FLUSH: 3.0000 mL | INTRAVENOUS | Status: DC | PRN

## 2020-06-04 MED ORDER — SODIUM CHLORIDE 0.9% FLUSH: 3.0000 mL | Freq: Two times a day (BID) | INTRAVENOUS | Status: DC

## 2020-06-04 MED ORDER — SODIUM CHLORIDE 0.9 % IV SOLN
80.0000 mg | INTRAVENOUS | Status: AC
  Administered 2020-06-04: 80 mg

## 2020-06-04 MED ORDER — MIDAZOLAM HCL 5 MG/5ML IJ SOLN
INTRAMUSCULAR | Status: AC
  Filled 2020-06-04: qty 5

## 2020-06-04 MED ORDER — LIDOCAINE HCL (PF) 1 % IJ SOLN
INTRAMUSCULAR | Status: AC
  Filled 2020-06-04: qty 60

## 2020-06-04 MED ORDER — CEFAZOLIN SODIUM-DEXTROSE 2-4 GM/100ML-% IV SOLN
2.0000 g | INTRAVENOUS | Status: AC
  Administered 2020-06-04: 2 g via INTRAVENOUS

## 2020-06-04 SURGICAL SUPPLY — 5 items
CABLE SURGICAL S-101-97-12 (CABLE) ×2 IMPLANT
IPG PACE AZUR XT DR MRI W1DR01 (Pacemaker) ×1 IMPLANT
PACE AZURE XT DR MRI W1DR01 (Pacemaker) ×2 IMPLANT
PAD PRO RADIOLUCENT 2001M-C (PAD) ×2 IMPLANT
TRAY PACEMAKER INSERTION (PACKS) ×2 IMPLANT

## 2020-06-04 NOTE — Discharge Instructions (Signed)
Pacemaker Battery Change, Care After This sheet gives you information about how to care for yourself after your procedure. Your health care provider may also give you more specific instructions. If you have problems or questions, contact your health care provider. What can I expect after the procedure? After the procedure, it is common to have these symptoms at the site where the pacemaker was inserted:  Mild pain or soreness.  Slight bruising.  Some swelling over the incisions.  A slight bump over the skin where the device was placed (if it was implanted in the upper chest area). Sometimes, it is possible to feel the device under the skin. This is normal. Follow these instructions at home: Incision care  Keep the incision clean and dry for 2-3 days after the procedure or as told by your health care provider. It takes several weeks for the incision site to completely heal.  Do not remove the bandage (dressing) on your chest until told to do so by your health care provider.  Leave stitches (sutures), skin glue, or adhesive strips in place. These skin closures may need to stay in place for 2 weeks or longer. If adhesive strip edges start to loosen and curl up, you may trim the loose edges. Do not remove adhesive strips completely unless your health care provider tells you to do that.  Do not take baths, swim, or use a hot tub for 7-10 days or until your health care provider approves. Ask your health care provider if you may take showers. You may only be allowed to take sponge baths.  Pat the incision area dry with a clean towel. Do not rub the area. This may cause bleeding.  Check your incision area every day for signs of infection. Check for: ? More redness, swelling, or pain. ? Fluid or blood. ? Warmth. ? Pus or a bad smell.  Avoid putting pressure on the area where the pacemaker was placed. Women may want to place a small pad over the incision site to protect it from their bra strap.    Medicines  Take over-the-counter and prescription medicines only as told by your health care provider.  If you were prescribed an antibiotic medicine, take it as told by your health care provider. Do not stop taking the antibiotic even if you start to feel better. Activity  For the first 2 weeks, or as long as told by your health care provider: ? Avoid lifting your left arm higher than your shoulder. ? Be gentle when you move your arms over your head. It is okay to raise your arm to comb your hair. ? Avoid exercise or activities that take a lot of effort.  Ask your health care provider when it is okay to: ? Return to your normal activities. ? Return to work or school. ? Resume sexual activity.  If you were given a medicine to help you relax (sedative) during the procedure, it can affect you for several hours. Do not drive or operate machinery until your health care provider says that it is safe. General instructions  Do not use any products that contain nicotine or tobacco, such as cigarettes, e-cigarettes, and chewing tobacco. These can delay incision healing after surgery. If you need help quitting, ask your health care provider.  Always let all health care providers, including dentists, know about your pacemaker before you have any medical procedures or tests.  You may be shown how to transfer data from your pacemaker through the phone to your  health care provider.  Wear a medical ID bracelet or necklace stating that you have a pacemaker, and carry a pacemaker ID card with you at all times.  Avoid close and prolonged exposure to electrical devices that have strong magnetic fields. These include: ? [employer] processing manager. When at the airport, let officials know that you have a pacemaker. Carry your pacemaker ID card. ? Metal detectors. If you must pass through a metal detector, walk through it quickly. Do not stop under the detector or stand near it.  When using your mobile  phone, hold it to the ear opposite the pacemaker. Do not leave your mobile phone in a pocket over the pacemaker.  Your pacemaker battery will last for 5-15 years. Your health care provider will do routine checks to know when the battery is starting to run down. When this happens, the pacemaker will need to be replaced.  Keep all follow-up visits as told by your health care provider. This is important. Contact a health care provider if:  You have pain at the incision site that is not relieved by medicines.  You have any of these signs of infection: ? More redness, swelling, or pain around your incision. ? Fluid or blood coming from your incision. ? Warmth coming from your incision. ? Pus or a bad smell coming from your incision. ? A fever.  You feel brief, occasional palpitations, light-headedness, or any symptoms that you think might be related to your heart. Get help right away if:  You have chest pain that is different from the pain at the pacemaker site.  You develop a red streak that extends above or below the incision site.  You have shortness of breath.  You have palpitations or an irregular heartbeat.  You have light-headedness that does not go away quickly.  You faint or have dizzy spells.  Your pulse suddenly drops or increases rapidly and does not return to normal.  You gain weight and your legs and ankles swell. Summary  After the procedure, it is common to have pain, soreness, and some swelling or bruising where the pacemaker was inserted.  Keep your incision clean and dry. Follow instructions from your health care provider about how to take care of your incision.  Check your incision every day for signs of infection, such as more pain or swelling, pus or a bad smell, warmth, or leaking fluid or blood.  Carry a pacemaker ID card with you at all times. This information is not intended to replace advice given to you by your health care provider. Make sure you  discuss any questions you have with your health care provider. Document Revised: 12/13/2018 Document Reviewed: 12/13/2018 Elsevier Patient Education  [place] eliquis on Monday (06/08/2020) with morning dose

## 2020-06-04 NOTE — H&P (Signed)
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  Since last being seen in our clinic, the patient reports doing very well.  Today, she denies symptoms of palpitations, chest pain, shortness of breath,  lower extremity edema, dizziness, presyncope, or syncope.  The patient is otherwise without complaint today.       Past Medical History:  Diagnosis Date  . AR (allergic rhinitis)   . Atrial fibrillation ([place])   . Atrial flutter ([place])   . Clotting disorder ([place])   . Diverticulosis   . GERD (gastroesophageal reflux disease)   . Hiatal hernia   . Hiatal hernia   . Hypothyroidism   . Migraines   . Mild sleep apnea   . Osteoarthritis   . Osteopenia after menopause 06/14/2017   DEXA 08/2016 [facility]: T = -1.10 radius, -1.0 femur; lumbar spine elevated due to DJD; recheck 2-3 years.  . Pacemaker    CHB  . Paroxysmal A-fib ([place])   . PUD (peptic ulcer disease)   . Sleep apnea   . Thyroid nodule   . Transient complete heart block 1996        Past Surgical History:  Procedure Laterality Date  . ATRIAL FIBRILLATION ABLATION N/A 01/08/2019   Procedure: ATRIAL FIBRILLATION ABLATION;  Surgeon: [name], MD;  Location: [place] [facility];  Service: Cardiovascular;  Laterality: N/A;  . ATRIAL FIBRILLATION ABLATION N/A 04/02/2019   Procedure: ATRIAL FIBRILLATION ABLATION;  Surgeon: [name], MD;  Location: [place] [facility];  Service: Cardiovascular;  Laterality: N/A;  . CARDIOVERSION N/A 01/28/2019   Procedure: CARDIOVERSION;  Surgeon: Donato Heinz, MD;  Location: [facility] ENDOSCOPY;  Service: Endoscopy;  Laterality: N/A;  . CARDIOVERSION N/A 02/27/2019   Procedure: CARDIOVERSION;  Surgeon: Donato Heinz, MD;  Location[facility] ENDOSCOPY;  Service: Endoscopy;  Laterality: N/A;  . HIP ARTHROPLASTY Right 2008  . PACEMAKER GENERATOR CHANGE  03/01/2007   performed by Dr Rosita Fire at [facility])  . PACEMAKER INSERTION  09/08/1994   performed in [facility] for transient complete heart block  . THYROIDECTOMY, PARTIAL  1986   BENIGN NODULES  . TOTAL HIP ARTHROPLASTY Left 02/13/2018   Procedure: TOTAL HIP ARTHROPLASTY ANTERIOR APPROACH;  Surgeon: Paralee Cancel, MD;  Location: [facility] ORS;  Service: Orthopedics;  Laterality: Left;  70 mins  . TUBAL LIGATION  1987    ROS- all systems are reviewed and negative except as per HPI above        Current Outpatient Medications  Medication Sig Dispense Refill  . acetaminophen (TYLENOL) 500 MG tablet Take 1,000 mg by mouth every 6 (six) hours as needed (for headaches or mild pain).    . CALCIUM PO Take 400 mg by mouth daily.    . Cholecalciferol (VITAMIN D3) 25 MCG (1000 UT) CAPS Take 1,000 Units by mouth daily.    . Cyanocobalamin (B-12) 1000 MCG SUBL Place 1,000 mcg under the tongue every Friday.     . diclofenac sodium (VOLTAREN) 1 % GEL Apply 2 g topically 4 (four) times daily as needed. 100 g 5  . diltiazem (CARDIZEM CD) 120 MG 24 hr capsule TAKE 1 CAPSULE TWICE DAILY. MAY TAKE EXTRA CAPSULE DAILY FOR BREAKTHROUGH AFIB. 225 capsule 3  . diltiazem (CARDIZEM) 30 MG tablet Take 1 tablet every 4 hours AS NEEDED for AFIB heart rate >100 45 tablet 3  . dofetilide (TIKOSYN) 500 MCG capsule TAKE (1) CAPSULE TWICE DAILY.  180 capsule 3  . ELIQUIS 5 MG TABS tablet TAKE 1 TABLET BY MOUTH TWICE DAILY. 180 tablet 1  . levothyroxine (SYNTHROID) 112 MCG tablet Take 1 tablet (112 mcg total) by mouth daily. 90 tablet 3  . Magnesium 250 MG TABS Take 250 mg by mouth every evening.     . Potassium 99 MG TABS Take 99 mg by mouth daily.     . sodium chloride (OCEAN) 0.65 % SOLN nasal spray Place 1 spray into both nostrils as needed for congestion.     . traMADol (ULTRAM) 50 MG tablet Take 1 tablet (50 mg total) by mouth every 6 (six) hours as needed for moderate pain. 30 tablet 0   No current facility-administered  medications for this visit.    Physical Exam: Vitals:   06/04/20 1107  BP: 112/87  Pulse: 89  Resp: 17  Temp: 98.8 F (37.1 C)  SpO2: 100%    GEN- The patient is well appearing, alert and oriented x 3 today.   Head- normocephalic, atraumatic Eyes-  Sclera clear, conjunctiva pink Ears- hearing intact Oropharynx- clear Lungs-   normal work of breathing Chest- pacemaker pocket is well healed Heart- Regular rate and rhythm  GI- soft,  Extremities- no clubbing, cyanosis, or edema   Assessment and Plan:  1. Symptomatic transient complete heart block She has reached ERI. We discussed options of device replacement vs not.  She is clear that she would like to proceed with generator change.  Risks, benefits, and alternatives to PPM pulse generator replacement were discussed in detail today.  The patient understands that risks include but are not limited to bleeding, infection, pneumothorax, perforation, tamponade, vascular damage, renal failure, MI, stroke, death, damage to his existing leads, and lead dislodgement and wishes to proceed.  [name] MD, [place] 06/04/2020 12:27 PM

## 2020-06-05 ENCOUNTER — Encounter (HOSPITAL_COMMUNITY): Payer: Self-pay | Admitting: Internal Medicine

## 2020-06-05 NOTE — Progress Notes (Signed)
Remote pacemaker transmission.   

## 2020-06-05 NOTE — Addendum Note (Signed)
Addended by: Cheri Kearns A on: 06/05/2020 10:46 AM   Modules accepted: Level of Service

## 2020-06-08 ENCOUNTER — Other Ambulatory Visit: Payer: Self-pay | Admitting: Internal Medicine

## 2020-06-08 NOTE — Telephone Encounter (Signed)
Eliquis 5mg  refill request received. Patient is 70 years old, weight-93kg, Crea-1.01 on 05/25/2020, Diagnosis-Afib/flutter, and last seen by Dr. Rayann Heman on 04/27/2020. Dose is appropriate based on dosing criteria. Will send in refill to requested pharmacy.

## 2020-06-09 ENCOUNTER — Other Ambulatory Visit: Payer: Medicare PPO

## 2020-06-09 ENCOUNTER — Other Ambulatory Visit: Payer: Self-pay

## 2020-06-09 DIAGNOSIS — E875 Hyperkalemia: Secondary | ICD-10-CM | POA: Diagnosis not present

## 2020-06-09 LAB — BASIC METABOLIC PANEL
BUN/Creatinine Ratio: 16 (ref 12–28)
BUN: 14 mg/dL (ref 8–27)
CO2: 23 mmol/L (ref 20–29)
Calcium: 10.1 mg/dL (ref 8.7–10.3)
Chloride: 100 mmol/L (ref 96–106)
Creatinine, Ser: 0.89 mg/dL (ref 0.57–1.00)
Glucose: 87 mg/dL (ref 65–99)
Potassium: 4.7 mmol/L (ref 3.5–5.2)
Sodium: 139 mmol/L (ref 134–144)
eGFR: 71 mL/min/{1.73_m2} (ref >59)

## 2020-06-16 ENCOUNTER — Ambulatory Visit (INDEPENDENT_AMBULATORY_CARE_PROVIDER_SITE_OTHER): Payer: Medicare PPO | Admitting: Emergency Medicine

## 2020-06-16 ENCOUNTER — Other Ambulatory Visit: Payer: Self-pay

## 2020-06-16 DIAGNOSIS — I442 Atrioventricular block, complete: Secondary | ICD-10-CM

## 2020-06-16 LAB — CUP PACEART INCLINIC DEVICE CHECK
Battery Remaining Longevity: 185 mo
Battery Voltage: 3.23 V
Brady Statistic AP VP Percent: 0 %
Brady Statistic AP VS Percent: 0 %
Brady Statistic AS VP Percent: 0.01 %
Brady Statistic AS VS Percent: 99.99 %
Brady Statistic RA Percent Paced: 0 %
Brady Statistic RV Percent Paced: 0.01 %
Date Time Interrogation Session: 20220524091052
Implantable Lead Implant Date: 19960815
Implantable Lead Implant Date: 19960815
Implantable Lead Location: 753859
Implantable Lead Location: 753860
Implantable Lead Model: 5024
Implantable Pulse Generator Implant Date: 20220512
Lead Channel Impedance Value: 323 Ohm
Lead Channel Impedance Value: 323 Ohm
Lead Channel Impedance Value: 437 Ohm
Lead Channel Impedance Value: 551 Ohm
Lead Channel Pacing Threshold Amplitude: 1.5 V
Lead Channel Pacing Threshold Pulse Width: 0.4 ms
Lead Channel Sensing Intrinsic Amplitude: 5.875 mV
Lead Channel Setting Pacing Amplitude: 2.5 V
Lead Channel Setting Pacing Pulse Width: 0.4 ms
Lead Channel Setting Sensing Sensitivity: 0.9 mV

## 2020-06-16 NOTE — Progress Notes (Signed)
Wound check appointment. Steri-strips removed. Wound without redness or edema. Incision edges approximated, wound well healed. Normal device function. Thresholds, sensing, and impedances consistent with implant measurements. Device programmed at chronic lead settings. Histogram distribution appropriate for patient and level of activity. 1 high ventricular rates noted 06/09/20 for 12 beats binned as NSVT. Hx of AF/AFL. EGM reviewed with irregular R-R. + Eliquis. Patient educated about wound care, arm mobility, lifting restrictions. Patient is enrolled in remote monitoring with next transmission scheduled 09/04/20. 91 day FU with Dr. Rayann Heman 09/15/19

## 2020-07-06 ENCOUNTER — Encounter: Payer: Medicare PPO | Admitting: Internal Medicine

## 2020-07-30 DIAGNOSIS — H2513 Age-related nuclear cataract, bilateral: Secondary | ICD-10-CM | POA: Diagnosis not present

## 2020-08-13 ENCOUNTER — Telehealth (INDEPENDENT_AMBULATORY_CARE_PROVIDER_SITE_OTHER): Payer: Medicare PPO | Admitting: Family

## 2020-08-13 ENCOUNTER — Encounter: Payer: Self-pay | Admitting: Family

## 2020-08-13 ENCOUNTER — Other Ambulatory Visit: Payer: Self-pay

## 2020-08-13 VITALS — Temp 99.3°F | Ht 67.0 in | Wt 205.0 lb

## 2020-08-13 DIAGNOSIS — R059 Cough, unspecified: Secondary | ICD-10-CM

## 2020-08-13 DIAGNOSIS — U071 COVID-19: Secondary | ICD-10-CM

## 2020-08-13 NOTE — Patient Instructions (Signed)
COVID-19: What to Do if You Are Sick CDC has updated isolation and quarantine recommendations for the public, and is revising the CDC website to reflect these changes. These recommendations do not apply to healthcare personnel and do not supersede state, local, tribal, or territorial laws, rules, andregulations. If you have a fever, cough or other symptoms, you might have COVID-19. Most people have mild illness and are able to recover at home. If you are sick: Keep track of your symptoms. If you have an emergency warning sign (including trouble breathing), call 911. Steps to help prevent the spread of COVID-19 if you are sick If you are sick with COVID-19 or think you might have COVID-19, follow the steps below to care for yourself and to help protect other peoplein your home and community. Stay home except to get medical care Stay home. Most people with COVID-19 have mild illness and can recover at home without medical care. Do not leave your home, except to get medical care. Do not visit public areas. Take care of yourself. Get rest and stay hydrated. Take over-the-counter medicines, such as acetaminophen, to help you feel better. Stay in touch with your doctor. Call before you get medical care. Be sure to get care if you have trouble breathing, or have any other emergency warning signs, or if you think it is an emergency. Avoid public transportation, ride-sharing, or taxis. Separate yourself from other people As much as possible, stay in a specific room and away from other people and pets in your home. If possible, you should use a separate bathroom. If you need to be around other people or animals in oroutside of the home, wear a mask. Tell your close contactsthat they may have been exposed to COVID-19. An infected person can spread COVID-19 starting 48 hours (or 2 days) before the person has any symptoms or tests positive. By letting your close contacts know they may have been exposed to COVID-19,  you are helping to protect everyone. Additional guidance is available for those living in close quarters and shared housing. See COVID-19 and Animals if you have questions about pets. If you are diagnosed with COVID-19, someone from the health department may call you. Answer the call to slow the spread. Monitor your symptoms Symptoms of COVID-19 include fever, cough, or other symptoms. Follow care instructions from your healthcare provider and local health department. Your local health authorities may give instructions on checking your symptoms and reporting information. When to seek emergency medical attention Look for emergency warning signs* for COVID-19. If someone is showing any of these signs, seek emergency medical care immediately: Trouble breathing Persistent pain or pressure in the chest New confusion Inability to wake or stay awake Pale, gray, or blue-colored skin, lips, or nail beds, depending on skin tone *This list is not all possible symptoms. Please call your medical provider forany other symptoms that are severe or concerning to you. Call 911 or call ahead to your local emergency facility: Notify the operator that you are seeking care for someone who has or may haveCOVID-19. Call ahead before visiting your doctor Call ahead. Many medical visits for routine care are being postponed or done by phone or telemedicine. If you have a medical appointment that cannot be postponed, call your doctor's office, and tell them you have or may have COVID-19. This will help the office protect themselves and other patients. Get tested If you have symptoms of COVID-19, get tested. While waiting for test results, you stay away from others,  including staying apart from those living in your household. Self-tests are one of several options for testing for the virus that causes COVID-19 and may be more convenient than laboratory-based tests and point-of-care tests. Ask your healthcare provider or  your local health department if you need help interpreting your test results. You can visit your state, tribal, local, and territorial health department's website to look for the latest local information on testing sites. If you are sick, wear a mask over your nose and mouth You should wear a mask over your nose and mouth if you must be around other people or animals, including pets (even at home). You don't need to wear the mask if you are alone. If you can't put on a mask (because of trouble breathing, for example), cover your coughs and sneezes in some other way. Try to stay at least 6 feet away from other people. This will help protect the people around you. Masks should not be placed on young children under age 57 years, anyone who has trouble breathing, or anyone who is not able to remove the mask without help. Note: During the COVID-19 pandemic, medical grade facemasks are reserved forhealthcare workers and some first responders. Cover your coughs and sneezes Cover your mouth and nose with a tissue when you cough or sneeze. Throw away used tissues in a lined trash can. Immediately wash your hands with soap and water for at least 20 seconds. If soap and water are not available, clean your hands with an alcohol-based hand sanitizer that contains at least 60% alcohol. Clean your hands often Wash your hands often with soap and water for at least 20 seconds. This is especially important after blowing your nose, coughing, or sneezing; going to the bathroom; and before eating or preparing food. Use hand sanitizer if soap and water are not available. Use an alcohol-based hand sanitizer with at least 60% alcohol, covering all surfaces of your hands and rubbing them together until they feel dry. Soap and water are the best option, especially if hands are visibly dirty. Avoid touching your eyes, nose, and mouth with unwashed hands. Handwashing Tips Avoid sharing personal household items Do not share  dishes, drinking glasses, cups, eating utensils, towels, or bedding with other people in your home. Wash these items thoroughly after using them with soap and water or put in the dishwasher. Clean all "high-touch" surfaces every day Clean and disinfect high-touch surfaces in your "sick room" and bathroom; wear disposable gloves. Let someone else clean and disinfect surfaces in common areas, but you should clean your bedroom and bathroom, if possible. If a caregiver or other person needs to clean and disinfect a sick person's bedroom or bathroom, they should do so on an as-needed basis. The caregiver/other person should wear a mask and disposable gloves prior to cleaning. They should wait as long as possible after the person who is sick has used the bathroom before coming in to clean and use the bathroom. High-touch surfaces include phones, remote controls, counters, tabletops, doorknobs, bathroom fixtures, toilets, keyboards, tablets, and bedside tables. Clean and disinfect areas that may have blood, stool, or body fluids on them. Use household cleaners and disinfectants. Clean the area or item with soap and water or another detergent if it is dirty. Then, use a household disinfectant. Be sure to follow the instructions on the label to ensure safe and effective use of the product. Many products recommend keeping the surface wet for several minutes to ensure germs are killed. Many  also recommend precautions such as wearing gloves and making sure you have good ventilation during use of the product. Use a product from [employer]: Disinfectants for Coronavirus ([id number]). Complete Disinfection Guidance When you can be around others after being sick with COVID-19 Deciding when you can be around others is different for different situations. Find out when you can safely end home isolation. For any additional questions about your care,contact your healthcare provider or state or local health  department. 01/01/2020 Content source: [facility] ([facility]), Division of Viral Diseases This information is not intended to replace advice given to you by your health care provider. Make sure you discuss any questions you have with your healthcare provider. Document Revised: 02/28/2020 Document Reviewed: 02/28/2020 Elsevier Patient Education  [place].

## 2020-08-13 NOTE — Progress Notes (Signed)
Virtual Visit via Telephone Note  I connected with Kristin [name] on 08/13/20 at 10:00 AM EDT by telephone and verified that I am speaking with the correct person using two identifiers.  Location: Patient: Home Provider: [place], [place]    I discussed the limitations, risks, security and privacy concerns of performing an evaluation and management service by telephone and the availability of in person appointments. I also discussed with the patient that there may be a patient responsible charge related to this service. The patient expressed understanding and agreed to proceed.   History of Present Illness: 70 year old female with a history of cardiovascular disease presents via telephone visit with concerns after testing positive for COVID-19 on yesterday.  Patient reports that she went to [place] and was around a bunch of people who eventually started testing positive.  Has symptoms of feeling achy, congested, and a scratchy throat over the last 2 days.  Her initial rapid antigen test was negative.  She subsequently took a second 1 that was positive.  She has been vaccinated and boosted.  Denies any shortness of breath or chest pain.  Takes Tikosyn.    Observations/Objective: Kristin [name] was seen today for covid positive, headache, fatigue, uri and fever.  Diagnoses and all orders for this visit:  COVID-19  Cough     Assessment and Plan: Consider inpatient symptoms of very mild, we have elected to not try antiviral therapy.  However, if her symptoms are worse today or tomorrow she will call back and let me know and I will call in [place].  Over-the-counter symptomatic treatment for relief.  Rest.  Drink plenty of fluids.    I discussed the assessment and treatment plan with the patient. The patient was provided an opportunity to ask questions and all were answered. The patient agreed with the plan and demonstrated an understanding of the instructions.   The  patient was advised to call back or seek an in-person evaluation if the symptoms worsen or if the condition fails to improve as anticipated.  I provided 15 minutes of non-face-to-face time during this encounter.   Kennyth Arnold, FNP

## 2020-08-30 ENCOUNTER — Other Ambulatory Visit: Payer: Self-pay | Admitting: Internal Medicine

## 2020-09-04 ENCOUNTER — Ambulatory Visit (INDEPENDENT_AMBULATORY_CARE_PROVIDER_SITE_OTHER): Payer: Medicare PPO

## 2020-09-04 DIAGNOSIS — I442 Atrioventricular block, complete: Secondary | ICD-10-CM

## 2020-09-06 ENCOUNTER — Other Ambulatory Visit: Payer: Self-pay | Admitting: Family Medicine

## 2020-09-06 LAB — CUP PACEART REMOTE DEVICE CHECK
Battery Remaining Longevity: 183 mo
Battery Voltage: 3.21 V
Brady Statistic AP VP Percent: 0 %
Brady Statistic AP VS Percent: 0 %
Brady Statistic AS VP Percent: 0.01 %
Brady Statistic AS VS Percent: 99.99 %
Brady Statistic RA Percent Paced: 0 %
Brady Statistic RV Percent Paced: 0.01 %
Date Time Interrogation Session: 20220812160440
Implantable Lead Implant Date: 19960815
Implantable Lead Implant Date: 19960815
Implantable Lead Location: 753859
Implantable Lead Location: 753860
Implantable Lead Model: 5024
Implantable Pulse Generator Implant Date: 20220512
Lead Channel Impedance Value: 323 Ohm
Lead Channel Impedance Value: 323 Ohm
Lead Channel Impedance Value: 437 Ohm
Lead Channel Impedance Value: 532 Ohm
Lead Channel Pacing Threshold Amplitude: 1.125 V
Lead Channel Pacing Threshold Pulse Width: 0.4 ms
Lead Channel Sensing Intrinsic Amplitude: 2.875 mV
Lead Channel Sensing Intrinsic Amplitude: 5.625 mV
Lead Channel Sensing Intrinsic Amplitude: 5.625 mV
Lead Channel Setting Pacing Amplitude: 3 V
Lead Channel Setting Pacing Pulse Width: 0.4 ms
Lead Channel Setting Sensing Sensitivity: 0.9 mV

## 2020-09-10 ENCOUNTER — Ambulatory Visit (INDEPENDENT_AMBULATORY_CARE_PROVIDER_SITE_OTHER): Payer: Medicare PPO

## 2020-09-10 DIAGNOSIS — Z0000 Encounter for general adult medical examination without abnormal findings: Secondary | ICD-10-CM | POA: Diagnosis not present

## 2020-09-10 NOTE — Progress Notes (Addendum)
Virtual Visit via Telephone Note  I connected with  Kristin [name] on 09/10/20 at 11:45 AM EDT by telephone and verified that I am speaking with the correct person using two identifiers.  Medicare Annual Wellness visit completed telephonically due to Covid-19 pandemic.   Persons participating in this call: This Health Coach and this patient.   Location: Patient: home Provider: office   I discussed the limitations, risks, security and privacy concerns of performing an evaluation and management service by telephone and the availability of in person appointments. The patient expressed understanding and agreed to proceed.  Unable to perform video visit due to video visit attempted and failed and/or patient does not have video capability.   Some vital signs may be absent or patient reported.   Willette Brace, LPN   Subjective:   Kristin [name] is a 70 y.o. female who presents for Medicare Annual (Subsequent) preventive examination.  Review of Systems     Cardiac Risk Factors include: advanced age (>18mn, >>39women);obesity (BMI >30kg/m2)     Objective:    There were no vitals filed for this visit. There is no height or weight on file to calculate BMI.  Advanced Directives 09/10/2020 09/05/2019 05/29/2019 05/13/2019 04/02/2019 02/27/2019 01/28/2019  Does Patient Have a Medical Advance Directive? Yes Yes Yes No Yes Yes Yes  Type of AParamedicof AJoyceLiving will HMetoliusLiving will - HRiver ForestLiving will HMonterey ParkLiving will HDumas Does patient want to make changes to medical advance directive? - - No - Patient declined - No - Patient declined - -  Copy of HSummersvillein Chart? Yes - validated most recent copy scanned in chart (See row information) Yes - validated most recent copy scanned in chart (See row information) No - copy requested  - No - copy requested Yes - validated most recent copy scanned in chart (See row information) No - copy requested  Would patient like information on creating a medical advance directive? - - - No - Patient declined - - -    Current Medications (verified) Outpatient Encounter Medications as of 09/10/2020  Medication Sig   acetaminophen (TYLENOL) 500 MG tablet Take 1,000 mg by mouth every 6 (six) hours as needed (for headaches or mild pain).   CALCIUM PO Take 400 mg by mouth daily.   Cholecalciferol (VITAMIN D3) 25 MCG (1000 UT) CAPS Take 1,000 Units by mouth daily.   Cyanocobalamin (B-12) 1000 MCG SUBL Place 1,000 mcg under the tongue every Friday.    diclofenac sodium (VOLTAREN) 1 % GEL Apply 2 g topically 4 (four) times daily as needed. (Patient taking differently: Apply 2 g topically 4 (four) times daily as needed (pain).)   diltiazem (CARDIZEM CD) 120 MG 24 hr capsule TAKE 1 CAPSULE TWICE DAILY. MAY TAKE EXTRA CAPSULE DAILY FOR BREAKTHROUGH AFIB. (Patient taking differently: Take 120 mg by mouth in the morning and at bedtime. TAKE 1 CAPSULE TWICE DAILY. MAY TAKE EXTRA CAPSULE DAILY FOR BREAKTHROUGH AFIB.)   diltiazem (CARDIZEM) 30 MG tablet Take 1 tablet every 4 hours AS NEEDED for AFIB heart rate >100 (Patient taking differently: Take by mouth See admin instructions. Take 1 tablet every 4 hours AS NEEDED for AFIB heart rate >100)   dofetilide (TIKOSYN) 500 MCG capsule TAKE (1) CAPSULE TWICE DAILY.   ELIQUIS 5 MG TABS tablet TAKE 1 TABLET BY MOUTH TWICE DAILY.   levothyroxine (  SYNTHROID) 112 MCG tablet TAKE 1 TABLET BY MOUTH DAILY.   Magnesium 250 MG TABS Take 250 mg by mouth every evening.    Potassium 99 MG TABS Take 99 mg by mouth daily.    sodium chloride (OCEAN) 0.65 % SOLN nasal spray Place 1 spray into both nostrils as needed for congestion.    traMADol (ULTRAM) 50 MG tablet Take 1 tablet (50 mg total) by mouth every 6 (six) hours as needed for moderate pain.   No facility-administered  encounter medications on file as of 09/10/2020.    Allergies (verified) Morphine and related, Neomycin, and Adhesive [tape]   History: Past Medical History:  Diagnosis Date   AR (allergic rhinitis)    Atrial fibrillation (HCC)    Atrial flutter (HCC)    Clotting disorder (HCC)    Diverticulosis    GERD (gastroesophageal reflux disease)    Hiatal hernia    Hiatal hernia    Hypothyroidism    Migraines    Mild sleep apnea    Osteoarthritis    Osteopenia after menopause 06/14/2017   DEXA 08/2016 [facility]: T = -1.10 radius, -1.0 femur; lumbar spine elevated due to DJD; recheck 2-3 years.   Pacemaker    CHB   Paroxysmal A-fib (HCC)    PUD (peptic ulcer disease)    Sleep apnea    Thyroid nodule    Transient complete heart block 1996   Past Surgical History:  Procedure Laterality Date   ATRIAL FIBRILLATION ABLATION N/A 01/08/2019   Procedure: ATRIAL FIBRILLATION ABLATION;  Surgeon: [name], MD;  Location: [facility];  Service: Cardiovascular;  Laterality: N/A;   ATRIAL FIBRILLATION ABLATION N/A 04/02/2019   Procedure: ATRIAL FIBRILLATION ABLATION;  Surgeon: [name], MD;  Location: [facility];  Service: Cardiovascular;  Laterality: N/A;   CARDIOVERSION N/A 01/28/2019   Procedure: CARDIOVERSION;  Surgeon: Donato Heinz, MD;  Location: [facility] ENDOSCOPY;  Service: Endoscopy;  Laterality: N/A;   CARDIOVERSION N/A 02/27/2019   Procedure: CARDIOVERSION;  Surgeon: Donato Heinz, MD;  Location[facility] ENDOSCOPY;  Service: Endoscopy;  Laterality: N/A;   HIP ARTHROPLASTY Right 2008   PACEMAKER GENERATOR CHANGE  03/01/2007   performed by Dr Rosita Fire at [facility]   PACEMAKER INSERTION  09/08/1994   performed in [facility] for transient complete heart block   PPM GENERATOR CHANGEOUT N/A 06/04/2020   Procedure: [place];  Surgeon: [name], MD;  Location: [place] [facility];  Service: Cardiovascular;  Laterality: N/A;    THYROIDECTOMY, PARTIAL  1986   BENIGN NODULES   TOTAL HIP ARTHROPLASTY Left 02/13/2018   Procedure: TOTAL HIP ARTHROPLASTY ANTERIOR APPROACH;  Surgeon: Paralee Cancel, MD;  Location: [facility];  Service: Orthopedics;  Laterality: Left;  70 mins   TUBAL LIGATION  1987   Family History  Problem Relation Age of Onset   Diabetes Mellitus II Maternal Uncle    Cancer Maternal Grandmother        ENDOMETRIAL   Rheum arthritis Mother    Peptic Ulcer Mother    COPD Brother    Drug abuse Brother    Breast cancer Neg Hx    Social History   Socioeconomic History   Marital status: Married    Spouse name: Not on file   Number of children: 0   Years of education: Not on file   Highest education level: Not on file  Occupational History   Occupation: retired  Tobacco Use   Smoking status: Never  Smokeless tobacco: Never  Vaping Use   Vaping Use: Never used  Substance and Sexual Activity   Alcohol use: Never    Alcohol/week: 1.0 standard drink    Types: 1 Glasses of wine per week   Drug use: No   Sexual activity: Not Currently  Other Topics Concern   Not on file  Social History Narrative   [employer]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   Married, no children   Social Determinants of Health   Financial Resource Strain: Low Risk    Difficulty of Paying Living Expenses: Not hard at all  Food Insecurity: No Food Insecurity   Worried About Charity fundraiser in the Last Year: Never true   Arboriculturist in the Last Year: Never true  Transportation Needs: No Transportation Needs   Lack of Transportation (Medical): No   Lack of Transportation (Non-Medical): No  Physical Activity: Sufficiently Active   Days of Exercise per Week: 5 days   Minutes of Exercise per Session: 30 min  Stress: No Stress Concern Present   Feeling of Stress : Not at all  Social Connections: Moderately Integrated   Frequency of Communication with Friends and Family: More than three times a week   Frequency of  Social Gatherings with Friends and Family: Once a week   Attends Religious Services: Never   Marine scientist or Organizations: Yes   Attends Music therapist: 1 to 4 times per year   Marital Status: Married    Tobacco Counseling Counseling given: Not Answered   Clinical Intake:  Pre-visit preparation completed: Yes  Pain : No/denies pain     BMI - recorded: 32.11 Nutritional Status: BMI > 30  Obese Nutritional Risks: None Diabetes: No  How often do you need to have someone help you when you read instructions, pamphlets, or other written materials from your doctor or pharmacy?: 1 - Never  Diabetic?no  Interpreter Needed?: No  Information entered by :: Charlott Rakes, LPN   Activities of Daily Living In your present state of health, do you have any difficulty performing the following activities: 09/10/2020 11/14/2019  Hearing? N N  Vision? N N  Difficulty concentrating or making decisions? N N  Walking or climbing stairs? N N  Dressing or bathing? N N  Doing errands, shopping? N N  Preparing Food and eating ? N -  Using the Toilet? N -  In the past six months, have you accidently leaked urine? N -  Do you have problems with loss of bowel control? N -  Managing your Medications? N -  Managing your Finances? N -  Housekeeping or managing your Housekeeping? N -  Some recent data might be hidden    Patient Care Team: Leamon Arnt, MD as PCP - General (Family Medicine) Melvenia Beam, MD as Consulting Physician (Neurology) Paralee Cancel, MD as Consulting Physician (Orthopedic Surgery) [name], MD as Consulting Physician (Cardiology) Efrain Sella, MD as Consulting Physician (Gastroenterology)  Indicate any recent Medical Services you may have received from other than [facility] providers in the past year (date may be approximate).     Assessment:   This is a routine wellness examination for Kristin [name].  Hearing/Vision screen Hearing  Screening - Comments:: Pt denies any hearing issues  Vision Screening - Comments:: Pt follows up with  [facility] for annual eye exams  Dietary issues and exercise activities discussed: Current Exercise Habits: Home exercise routine, Type  of exercise: walking, Time (Minutes): 30, Frequency (Times/Week): 5, Weekly Exercise (Minutes/Week): 150   Goals Addressed             This Visit's Progress    Patient Stated       Lose weight        Depression Screen PHQ 2/9 Scores 09/10/2020 08/13/2020 11/14/2019 09/05/2019 10/08/2018 06/14/2017  PHQ - 2 Score 0 0 0 0 0 0  PHQ- 9 Score - - - - - 0    Fall Risk Fall Risk  09/10/2020 08/13/2020 11/14/2019 09/05/2019 10/08/2018  Falls in the past year? 0 0 0 0 0  Number falls in past yr: 0 - 0 0 0  Injury with Fall? 0 - 0 0 0  Risk for fall due to : Impaired vision No Fall Risks - Impaired vision -  Follow up Falls prevention discussed - - Falls prevention discussed Falls evaluation completed    FALL RISK PREVENTION PERTAINING TO THE HOME:  Any stairs in or around the home? Yes  If so, are there any without handrails? No  Home free of loose throw rugs in walkways, pet beds, electrical cords, etc? Yes  Adequate lighting in your home to reduce risk of falls? Yes   ASSISTIVE DEVICES UTILIZED TO PREVENT FALLS:  Life alert? No  Use of a cane, walker or w/c? No  Grab bars in the bathroom? No  Shower chair or bench in shower? No  Elevated toilet seat or a handicapped toilet? No   TIMED UP AND GO:  Was the test performed? Yes .   Cognitive Function:     6CIT Screen 09/10/2020  What Year? 0 points  What month? 0 points  What time? 0 points  Count back from 20 0 points  Months in reverse 0 points  Repeat phrase 0 points  Total Score 0    Immunizations Immunization History  Administered Date(s) Administered   Fluad Quad(high Dose 65+) 10/08/2018, 11/14/2019   Hepatitis B, ped/adol 11/24/2009   Influenza, High Dose Seasonal  PF 11/01/2016, 10/20/2017   Influenza, Quadrivalent, Recombinant, Inj, Pf 01/08/2013   Influenza, Seasonal, Injecte, Preservative Fre 11/11/2014   Influenza,inj,Quad PF,6+ Mos 12/24/2015   Influenza-Unspecified 11/01/2016   Moderna Sars-Covid-2 Vaccination 02/18/2019, 03/18/2019, 12/30/2019   Pneumococcal Conjugate-13 08/17/2016   Pneumococcal Polysaccharide-23 10/20/2017   Tdap 09/07/2010, 09/07/2010, 07/01/2013   Zoster Recombinat (Shingrix) 07/26/2017, 11/20/2017   Zoster, Live 01/08/2013    TDAP status: Up to date  Flu Vaccine status: Due, Education has been provided regarding the importance of this vaccine. Advised may receive this vaccine at local pharmacy or Health Dept. Aware to provide a copy of the vaccination record if obtained from local pharmacy or Health Dept. Verbalized acceptance and understanding.  Pneumococcal vaccine status: Up to date  Covid-19 vaccine status: Completed vaccines  Qualifies for Shingles Vaccine? Yes   Zostavax completed Yes   Shingrix Completed?: Yes  Screening Tests Health Maintenance  Topic Date Due   COVID-19 Vaccine (4 - Booster for Moderna series) 04/29/2020   INFLUENZA VACCINE  08/24/2020   DEXA SCAN  10/28/2020   MAMMOGRAM  11/19/2020   TETANUS/TDAP  07/02/2023   COLONOSCOPY (Pts 45-18yr Insurance coverage will need to be confirmed)  12/28/2025   Hepatitis C Screening  Completed   PNA vac Low Risk Adult  Completed   Zoster Vaccines- Shingrix  Completed   HPV VACCINES  Aged Out    Health Maintenance  Health Maintenance Due  Topic Date Due  COVID-19 Vaccine (4 - Booster for Moderna series) 04/29/2020   INFLUENZA VACCINE  08/24/2020    Colorectal cancer screening: Type of screening: Colonoscopy. Completed 12/29/15. Repeat every 10 years  Mammogram status: Completed 11/20/19. Repeat every year  Bone Density status: Completed 10/29/18. Results reflect: Bone density results: OSTEOPENIA. Repeat every 2 years.  Additional  Screening:  Hepatitis C Screening:  Completed 07/31/15  Vision Screening: Recommended annual ophthalmology exams for early detection of glaucoma and other disorders of the eye. Is the patient up to date with their annual eye exam?  Yes  Who is the provider or what is the name of the office in which the patient attends annual eye exams? [facility]  If pt is not established with a provider, would they like to be referred to a provider to establish care? No .   Dental Screening: Recommended annual dental exams for proper oral hygiene  Community Resource Referral / Chronic Care Management: CRR required this visit?  No   CCM required this visit?  No      Plan:     I have personally reviewed and noted the following in the patient's chart:   Medical and social history Use of alcohol, tobacco or illicit drugs  Current medications and supplements including opioid prescriptions.  Functional ability and status Nutritional status Physical activity Advanced directives List of other physicians Hospitalizations, surgeries, and ER visits in previous 12 months Vitals Screenings to include cognitive, depression, and falls Referrals and appointments  In addition, I have reviewed and discussed with patient certain preventive protocols, quality metrics, and best practice recommendations. A written personalized care plan for preventive services as well as general preventive health recommendations were provided to patient.     Willette Brace, LPN   [id number]   Nurse Notes: None

## 2020-09-10 NOTE — Patient Instructions (Signed)
Kristin [name] , Thank you for taking time to come for your Medicare Wellness Visit. I appreciate your ongoing commitment to your health goals. Please review the following plan we discussed and let me know if I can assist you in the future.   Screening recommendations/referrals: Colonoscopy: done 12/29/15 repeat every 10 years 12/28/25 Mammogram: Done 11/20/19 repeat every year done  Bone Density: done 10/29/18 repeat every 2 years  Recommended yearly ophthalmology/optometry visit for glaucoma screening and checkup Recommended yearly dental visit for hygiene and checkup  Vaccinations: Influenza vaccine: due Pneumococcal vaccine: completed Tdap vaccine: done 07/01/13 repeat every 10 years  Shingles vaccine: Completed 7/3 & 11/20/17   Covid-19:Completed 1/25, 2/22 &12/30/19  Advanced directives: copies in chart   Conditions/risks identified: Lose weight   Next appointment: Follow up in one year for your annual wellness visit    Preventive Care 65 Years and Older, Female Preventive care refers to lifestyle choices and visits with your health care provider that can promote health and wellness. What does preventive care include? A yearly physical exam. This is also called an annual well check. Dental exams once or twice a year. Routine eye exams. Ask your health care provider how often you should have your eyes checked. Personal lifestyle choices, including: Daily care of your teeth and gums. Regular physical activity. Eating a healthy diet. Avoiding tobacco and drug use. Limiting alcohol use. Practicing safe sex. Taking low-dose aspirin every day. Taking vitamin and mineral supplements as recommended by your health care provider. What happens during an annual well check? The services and screenings done by your health care provider during your annual well check will depend on your age, overall health, lifestyle risk factors, and family history of disease. Counseling  Your health care  provider may ask you questions about your: Alcohol use. Tobacco use. Drug use. Emotional well-being. Home and relationship well-being. Sexual activity. Eating habits. History of falls. Memory and ability to understand (cognition). Work and work Statistician. Reproductive health. Screening  You may have the following tests or measurements: Height, weight, and BMI. Blood pressure. Lipid and cholesterol levels. These may be checked every 5 years, or more frequently if you are over 8 years old. Skin check. Lung cancer screening. You may have this screening every year starting at age 56 if you have a 30-pack-year history of smoking and currently smoke or have quit within the past 15 years. Fecal occult blood test (FOBT) of the stool. You may have this test every year starting at age 64. Flexible sigmoidoscopy or colonoscopy. You may have a sigmoidoscopy every 5 years or a colonoscopy every 10 years starting at age 78. Hepatitis C blood test. Hepatitis B blood test. Sexually transmitted disease (STD) testing. Diabetes screening. This is done by checking your blood sugar (glucose) after you have not eaten for a while (fasting). You may have this done every 1-3 years. Bone density scan. This is done to screen for osteoporosis. You may have this done starting at age 108. Mammogram. This may be done every 1-2 years. Talk to your health care provider about how often you should have regular mammograms. Talk with your health care provider about your test results, treatment options, and if necessary, the need for more tests. Vaccines  Your health care provider may recommend certain vaccines, such as: Influenza vaccine. This is recommended every year. Tetanus, diphtheria, and acellular pertussis (Tdap, Td) vaccine. You may need a Td booster every 10 years. Zoster vaccine. You may need this after age 66.  Pneumococcal 13-valent conjugate (PCV13) vaccine. One dose is recommended after age  56. Pneumococcal polysaccharide (PPSV23) vaccine. One dose is recommended after age 51. Talk to your health care provider about which screenings and vaccines you need and how often you need them. This information is not intended to replace advice given to you by your health care provider. Make sure you discuss any questions you have with your health care provider. Document Released: 02/06/2015 Document Revised: 09/30/2015 Document Reviewed: 11/11/2014 Elsevier Interactive Patient Education  2017 [place] Prevention in the Home Falls can cause injuries. They can happen to people of all ages. There are many things you can do to make your home safe and to help prevent falls. What can I do on the outside of my home? Regularly fix the edges of walkways and driveways and fix any cracks. Remove anything that might make you trip as you walk through a door, such as a raised step or threshold. Trim any bushes or trees on the path to your home. Use bright outdoor lighting. Clear any walking paths of anything that might make someone trip, such as rocks or tools. Regularly check to see if handrails are loose or broken. Make sure that both sides of any steps have handrails. Any raised decks and porches should have guardrails on the edges. Have any leaves, snow, or ice cleared regularly. Use sand or salt on walking paths during winter. Clean up any spills in your garage right away. This includes oil or grease spills. What can I do in the bathroom? Use night lights. Install grab bars by the toilet and in the tub and shower. Do not use towel bars as grab bars. Use non-skid mats or decals in the tub or shower. If you need to sit down in the shower, use a plastic, non-slip stool. Keep the floor dry. Clean up any water that spills on the floor as soon as it happens. Remove soap buildup in the tub or shower regularly. Attach bath mats securely with double-sided non-slip rug tape. Do not have throw  rugs and other things on the floor that can make you trip. What can I do in the bedroom? Use night lights. Make sure that you have a light by your bed that is easy to reach. Do not use any sheets or blankets that are too big for your bed. They should not hang down onto the floor. Have a firm chair that has side arms. You can use this for support while you get dressed. Do not have throw rugs and other things on the floor that can make you trip. What can I do in the kitchen? Clean up any spills right away. Avoid walking on wet floors. Keep items that you use a lot in easy-to-reach places. If you need to reach something above you, use a strong step stool that has a grab bar. Keep electrical cords out of the way. Do not use floor polish or wax that makes floors slippery. If you must use wax, use non-skid floor wax. Do not have throw rugs and other things on the floor that can make you trip. What can I do with my stairs? Do not leave any items on the stairs. Make sure that there are handrails on both sides of the stairs and use them. Fix handrails that are broken or loose. Make sure that handrails are as long as the stairways. Check any carpeting to make sure that it is firmly attached to the stairs. Fix any  carpet that is loose or worn. Avoid having throw rugs at the top or bottom of the stairs. If you do have throw rugs, attach them to the floor with carpet tape. Make sure that you have a light switch at the top of the stairs and the bottom of the stairs. If you do not have them, ask someone to add them for you. What else can I do to help prevent falls? Wear shoes that: Do not have high heels. Have rubber bottoms. Are comfortable and fit you well. Are closed at the toe. Do not wear sandals. If you use a stepladder: Make sure that it is fully opened. Do not climb a closed stepladder. Make sure that both sides of the stepladder are locked into place. Ask someone to hold it for you, if  possible. Clearly mark and make sure that you can see: Any grab bars or handrails. First and last steps. Where the edge of each step is. Use tools that help you move around (mobility aids) if they are needed. These include: Canes. Walkers. Scooters. Crutches. Turn on the lights when you go into a dark area. Replace any light bulbs as soon as they burn out. Set up your furniture so you have a clear path. Avoid moving your furniture around. If any of your floors are uneven, fix them. If there are any pets around you, be aware of where they are. Review your medicines with your doctor. Some medicines can make you feel dizzy. This can increase your chance of falling. Ask your doctor what other things that you can do to help prevent falls. This information is not intended to replace advice given to you by your health care provider. Make sure you discuss any questions you have with your health care provider. Document Released: 11/06/2008 Document Revised: 06/18/2015 Document Reviewed: 02/14/2014 Elsevier Interactive Patient Education  2017 [employer].

## 2020-09-14 ENCOUNTER — Other Ambulatory Visit: Payer: Self-pay

## 2020-09-14 ENCOUNTER — Ambulatory Visit: Payer: Medicare PPO | Admitting: Internal Medicine

## 2020-09-14 VITALS — BP 98/66 | HR 78 | Ht 67.0 in | Wt 211.8 lb

## 2020-09-14 DIAGNOSIS — I483 Typical atrial flutter: Secondary | ICD-10-CM | POA: Diagnosis not present

## 2020-09-14 DIAGNOSIS — I4819 Other persistent atrial fibrillation: Secondary | ICD-10-CM | POA: Diagnosis not present

## 2020-09-14 DIAGNOSIS — I442 Atrioventricular block, complete: Secondary | ICD-10-CM | POA: Diagnosis not present

## 2020-09-14 DIAGNOSIS — I484 Atypical atrial flutter: Secondary | ICD-10-CM

## 2020-09-14 LAB — CBC
Hematocrit: 37.8 % (ref 34.0–46.6)
Hemoglobin: 12.4 g/dL (ref 11.1–15.9)
MCH: 30.3 pg (ref 26.6–33.0)
MCHC: 32.8 g/dL (ref 31.5–35.7)
MCV: 92 fL (ref 79–97)
Platelets: 250 10*3/uL (ref 150–450)
RBC: 4.09 x10E6/uL (ref 3.77–5.28)
RDW: 12.9 % (ref 11.7–15.4)
WBC: 5.7 10*3/uL (ref 3.4–10.8)

## 2020-09-14 LAB — BASIC METABOLIC PANEL
BUN/Creatinine Ratio: 18 (ref 12–28)
BUN: 16 mg/dL (ref 8–27)
CO2: 23 mmol/L (ref 20–29)
Calcium: 9.7 mg/dL (ref 8.7–10.3)
Chloride: 100 mmol/L (ref 96–106)
Creatinine, Ser: 0.9 mg/dL (ref 0.57–1.00)
Glucose: 96 mg/dL (ref 65–99)
Potassium: 4.5 mmol/L (ref 3.5–5.2)
Sodium: 138 mmol/L (ref 134–144)
eGFR: 69 mL/min/{1.73_m2} (ref >59)

## 2020-09-14 LAB — MAGNESIUM: Magnesium: 2.1 mg/dL (ref 1.6–2.3)

## 2020-09-14 NOTE — Progress Notes (Signed)
PCP: Leamon Arnt, MD   Primary EP:  Dr Roney Mans Revolorio is a 70 y.o. female who presents today for routine electrophysiology followup.  Since her pacemaker generator change, the patient reports doing very well.  Today, she denies symptoms of palpitations, chest pain, shortness of breath,  lower extremity edema, dizziness, presyncope, or syncope.  The patient is otherwise without complaint today.   Past Medical History:  Diagnosis Date   AR (allergic rhinitis)    Atrial fibrillation (HCC)    Atrial flutter (HCC)    Clotting disorder (HCC)    Diverticulosis    GERD (gastroesophageal reflux disease)    Hiatal hernia    Hiatal hernia    Hypothyroidism    Migraines    Mild sleep apnea    Osteoarthritis    Osteopenia after menopause 06/14/2017   DEXA 08/2016 [facility]: T = -1.10 radius, -1.0 femur; lumbar spine elevated due to DJD; recheck 2-3 years.   Pacemaker    CHB   Paroxysmal A-fib (HCC)    PUD (peptic ulcer disease)    Sleep apnea    Thyroid nodule    Transient complete heart block 1996   Past Surgical History:  Procedure Laterality Date   ATRIAL FIBRILLATION ABLATION N/A 01/08/2019   Procedure: ATRIAL FIBRILLATION ABLATION;  Surgeon: [name], MD;  Location: [place] [facility];  Service: Cardiovascular;  Laterality: N/A;   ATRIAL FIBRILLATION ABLATION N/A 04/02/2019   Procedure: ATRIAL FIBRILLATION ABLATION;  Surgeon: [name], MD;  Location: [place] [facility];  Service: Cardiovascular;  Laterality: N/A;   CARDIOVERSION N/A 01/28/2019   Procedure: CARDIOVERSION;  Surgeon: Donato Heinz, MD;  Location: [facility] ENDOSCOPY;  Service: Endoscopy;  Laterality: N/A;   CARDIOVERSION N/A 02/27/2019   Procedure: CARDIOVERSION;  Surgeon: Donato Heinz, MD;  Location: [facility] ENDOSCOPY;  Service: Endoscopy;  Laterality: N/A;   HIP ARTHROPLASTY Right 2008   PACEMAKER GENERATOR CHANGE  03/01/2007   performed by Dr Rosita Fire at [facility]    PACEMAKER INSERTION  09/08/1994   performed in [facility] for transient complete heart block   PPM GENERATOR CHANGEOUT N/A 06/04/2020   Procedure: [place];  Surgeon: [name], MD;  Location: [place] [facility];  Service: Cardiovascular;  Laterality: N/A;   THYROIDECTOMY, PARTIAL  1986   BENIGN NODULES   TOTAL HIP ARTHROPLASTY Left 02/13/2018   Procedure: TOTAL HIP ARTHROPLASTY ANTERIOR APPROACH;  Surgeon: Paralee Cancel, MD;  Location: [facility];  Service: Orthopedics;  Laterality: Left;  70 mins   TUBAL LIGATION  1987    ROS- all systems are reviewed and negative except as per HPI above  Current Outpatient Medications  Medication Sig Dispense Refill   acetaminophen (TYLENOL) 500 MG tablet Take 1,000 mg by mouth every 6 (six) hours as needed (for headaches or mild pain).     CALCIUM PO Take 400 mg by mouth daily.     Cholecalciferol (VITAMIN D3) 25 MCG (1000 UT) CAPS Take 1,000 Units by mouth daily.     Cyanocobalamin (B-12) 1000 MCG SUBL Place 1,000 mcg under the tongue every Friday.      diclofenac sodium (VOLTAREN) 1 % GEL Apply 2 g topically 4 (four) times daily as needed. (Patient taking differently: Apply 2 g topically 4 (four) times daily as needed (pain).) 100 g 5   diltiazem (CARDIZEM CD) 120 MG 24 hr capsule TAKE 1 CAPSULE TWICE DAILY. MAY TAKE EXTRA CAPSULE DAILY FOR BREAKTHROUGH AFIB. (Patient taking  differently: Take 120 mg by mouth in the morning and at bedtime. TAKE 1 CAPSULE TWICE DAILY. MAY TAKE EXTRA CAPSULE DAILY FOR BREAKTHROUGH AFIB.) 225 capsule 3   diltiazem (CARDIZEM) 30 MG tablet Take 1 tablet every 4 hours AS NEEDED for AFIB heart rate >100 (Patient taking differently: Take by mouth See admin instructions. Take 1 tablet every 4 hours AS NEEDED for AFIB heart rate >100) 45 tablet 3   dofetilide (TIKOSYN) 500 MCG capsule TAKE (1) CAPSULE TWICE DAILY. 180 capsule 1   ELIQUIS 5 MG TABS tablet TAKE 1 TABLET BY MOUTH TWICE DAILY. 180 tablet 1    levothyroxine (SYNTHROID) 112 MCG tablet TAKE 1 TABLET BY MOUTH DAILY. 90 tablet 3   Magnesium 250 MG TABS Take 250 mg by mouth every evening.      Potassium 99 MG TABS Take 99 mg by mouth daily.      sodium chloride (OCEAN) 0.65 % SOLN nasal spray Place 1 spray into both nostrils as needed for congestion.      traMADol (ULTRAM) 50 MG tablet Take 1 tablet (50 mg total) by mouth every 6 (six) hours as needed for moderate pain. 30 tablet 0   No current facility-administered medications for this visit.    Physical Exam: Vitals:   09/14/20 0935  BP: 98/66  Pulse: 78  SpO2: 97%  Weight: 211 lb 12.8 oz (96.1 kg)  Height: '5\' 7"'$  (1.702 m)    GEN- The patient is well appearing, alert and oriented x 3 today.   Head- normocephalic, atraumatic Eyes-  Sclera clear, conjunctiva pink Ears- hearing intact Oropharynx- clear Lungs- Clear to ausculation bilaterally, normal work of breathing Chest- pacemaker pocket is well healed Heart- Regular rate and rhythm, no murmurs, rubs or gallops, PMI not laterally displaced GI- soft, NT, ND, + BS Extremities- no clubbing, cyanosis, or edema  Pacemaker interrogation- reviewed in detail today,  See PACEART report  ekg tracing ordered today is personally reviewed and shows sinus, incomplete rbbb, qtc 446 msec  Assessment and Plan:  1. Symptomatic transient complete heart block Very rarely paces Programmed VVI 35 bpm Normal pacemaker function See Pace Art report High V rates adjusted from 150 bpm to 170 bpm  she is not device dependant today  2. Persistent afib/ typical and atypical atrial flutter Doing well s/p ablation despite severe LA enlargement Continue tikosyn Bmet 06/09/20 reviewed Repeat BMET, mg today Chads2vasc score is 2.  Continue eliquis Cbc today  Risks, benefits and potential toxicities for medications prescribed and/or refilled reviewed with patient today.   Return to see EP APP every 6 months for tikosyn management  [name] MD, [facility] 09/14/2020 9:46 AM

## 2020-09-14 NOTE — Patient Instructions (Addendum)
Medication Instructions:  Your physician recommends that you continue on your current medications as directed. Please refer to the Current Medication list given to you today.  Labwork: CBC, BMP, MAG  Testing/Procedures: None ordered.  Follow-Up: Your physician wants you to follow-up in:  03/15/21 at 9:15 am with  one of the following Advanced Practice Providers on your designated Care Team:    Tommye Standard, PA-C  Remote monitoring is used to monitor your Pacemaker from home. This monitoring reduces the number of office visits required to check your device to one time per year. It allows [place] to keep an eye on the functioning of your device to ensure it is working properly. You are scheduled for a device check from home on 12/04/20. You may send your transmission at any time that day. If you have a wireless device, the transmission will be sent automatically. After your physician reviews your transmission, you will receive a postcard with your next transmission date.  Any Other Special Instructions Will Be Listed Below (If Applicable).  If you need a refill on your cardiac medications before your next appointment, please call your pharmacy.

## 2020-09-23 NOTE — Progress Notes (Signed)
Remote pacemaker transmission.   

## 2020-10-15 ENCOUNTER — Other Ambulatory Visit: Payer: Self-pay | Admitting: Family Medicine

## 2020-10-15 DIAGNOSIS — Z1231 Encounter for screening mammogram for malignant neoplasm of breast: Secondary | ICD-10-CM

## 2020-11-16 ENCOUNTER — Ambulatory Visit (INDEPENDENT_AMBULATORY_CARE_PROVIDER_SITE_OTHER): Payer: Medicare PPO | Admitting: Family Medicine

## 2020-11-16 ENCOUNTER — Encounter: Payer: Self-pay | Admitting: Family Medicine

## 2020-11-16 ENCOUNTER — Other Ambulatory Visit: Payer: Self-pay

## 2020-11-16 VITALS — BP 118/70 | HR 74 | Temp 97.7°F | Ht 67.0 in | Wt 217.8 lb

## 2020-11-16 DIAGNOSIS — Z950 Presence of cardiac pacemaker: Secondary | ICD-10-CM

## 2020-11-16 DIAGNOSIS — G4733 Obstructive sleep apnea (adult) (pediatric): Secondary | ICD-10-CM | POA: Diagnosis not present

## 2020-11-16 DIAGNOSIS — I442 Atrioventricular block, complete: Secondary | ICD-10-CM | POA: Diagnosis not present

## 2020-11-16 DIAGNOSIS — I517 Cardiomegaly: Secondary | ICD-10-CM | POA: Diagnosis not present

## 2020-11-16 DIAGNOSIS — Z0000 Encounter for general adult medical examination without abnormal findings: Secondary | ICD-10-CM | POA: Diagnosis not present

## 2020-11-16 DIAGNOSIS — I4819 Other persistent atrial fibrillation: Secondary | ICD-10-CM

## 2020-11-16 DIAGNOSIS — Z23 Encounter for immunization: Secondary | ICD-10-CM | POA: Diagnosis not present

## 2020-11-16 DIAGNOSIS — M160 Bilateral primary osteoarthritis of hip: Secondary | ICD-10-CM

## 2020-11-16 DIAGNOSIS — Z7901 Long term (current) use of anticoagulants: Secondary | ICD-10-CM | POA: Diagnosis not present

## 2020-11-16 DIAGNOSIS — M8580 Other specified disorders of bone density and structure, unspecified site: Secondary | ICD-10-CM | POA: Diagnosis not present

## 2020-11-16 DIAGNOSIS — I484 Atypical atrial flutter: Secondary | ICD-10-CM

## 2020-11-16 DIAGNOSIS — E890 Postprocedural hypothyroidism: Secondary | ICD-10-CM | POA: Diagnosis not present

## 2020-11-16 DIAGNOSIS — Z780 Asymptomatic menopausal state: Secondary | ICD-10-CM

## 2020-11-16 LAB — LIPID PANEL
Cholesterol: 188 mg/dL (ref 0–200)
HDL: 86.5 mg/dL (ref >39.00)
LDL Cholesterol: 86 mg/dL (ref 0–99)
NonHDL: 101.69
Total CHOL/HDL Ratio: 2
Triglycerides: 76 mg/dL (ref 0.0–149.0)
VLDL: 15.2 mg/dL (ref 0.0–40.0)

## 2020-11-16 LAB — COMPREHENSIVE METABOLIC PANEL
ALT: 17 U/L (ref 0–35)
AST: 25 U/L (ref 0–37)
Albumin: 4.3 g/dL (ref 3.5–5.2)
Alkaline Phosphatase: 97 U/L (ref 39–117)
BUN: 16 mg/dL (ref 6–23)
CO2: 26 mEq/L (ref 19–32)
Calcium: 9.8 mg/dL (ref 8.4–10.5)
Chloride: 102 mEq/L (ref 96–112)
Creatinine, Ser: 0.87 mg/dL (ref 0.40–1.20)
GFR: 68.01 mL/min (ref >60.00)
Glucose, Bld: 88 mg/dL (ref 70–99)
Potassium: 4.2 mEq/L (ref 3.5–5.1)
Sodium: 137 mEq/L (ref 135–145)
Total Bilirubin: 0.5 mg/dL (ref 0.2–1.2)
Total Protein: 7.1 g/dL (ref 6.0–8.3)

## 2020-11-16 LAB — CBC WITH DIFFERENTIAL/PLATELET
Basophils Absolute: 0.1 10*3/uL (ref 0.0–0.1)
Basophils Relative: 1.1 % (ref 0.0–3.0)
Eosinophils Absolute: 0.1 10*3/uL (ref 0.0–0.7)
Eosinophils Relative: 2.5 % (ref 0.0–5.0)
HCT: 35.5 % — ABNORMAL LOW (ref 36.0–46.0)
Hemoglobin: 11.5 g/dL — ABNORMAL LOW (ref 12.0–15.0)
Lymphocytes Relative: 15.4 % (ref 12.0–46.0)
Lymphs Abs: 0.9 10*3/uL (ref 0.7–4.0)
MCHC: 32.4 g/dL (ref 30.0–36.0)
MCV: 94.4 fl (ref 78.0–100.0)
Monocytes Absolute: 0.5 10*3/uL (ref 0.1–1.0)
Monocytes Relative: 8.4 % (ref 3.0–12.0)
Neutro Abs: 4.1 10*3/uL (ref 1.4–7.7)
Neutrophils Relative %: 72.6 % (ref 43.0–77.0)
Platelets: 245 10*3/uL (ref 150.0–400.0)
RBC: 3.75 Mil/uL — ABNORMAL LOW (ref 3.87–5.11)
RDW: 15.1 % (ref 11.5–15.5)
WBC: 5.7 10*3/uL (ref 4.0–10.5)

## 2020-11-16 LAB — TSH: TSH: 0.94 u[IU]/mL (ref 0.35–5.50)

## 2020-11-16 MED ORDER — TRAMADOL HCL 50 MG PO TABS: 50.0000 mg | ORAL_TABLET | Freq: Four times a day (QID) | ORAL | 0 refills | Status: DC | PRN

## 2020-11-16 NOTE — Patient Instructions (Signed)
Please return in 12 months for your annual complete physical; please come fasting.   I will release your lab results to you on your MyChart account with further instructions. Please reply with any questions.    Today you were given your flu vaccination.    If you have any questions or concerns, please don't hesitate to send me a message via MyChart or call the office at [phone]. Thank you for visiting with [place] today! It's our pleasure caring for you.

## 2020-11-16 NOTE — Progress Notes (Signed)
Subjective  Chief Complaint  Patient presents with   Annual Exam   Gastroesophageal Reflux   Hypothyroidism    HPI: Kristin [name] is a 70 y.o. female who presents to [place] [facility] [place] today for a Female Wellness Visit. She also has the concerns and/or needs as listed above in the chief complaint. These will be addressed in addition to the Health Maintenance Visit.   Wellness Visit: annual visit with health maintenance review and exam without Pap  HM: Mammogram scheduled for this month.  Colorectal cancer screening is up-to-date.  Overall she is doing very well.  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  Mood is good.  No new concerns.  Eligible for flu shot today. Chronic disease f/u and/or acute problem visit: (deemed necessary to be done in addition to the wellness visit): Hypothyroidism, postsurgical: Clinically feels well.  Energy levels are good.  Mood is good.  No symptoms of high or low thyroid compliant with medications.  Due for recheck Persistent A. fib on Eliquis, history of complete heart block with pacemaker, atypical atrial flutter: Reviewed recent cardiology notes.  Reviewed recent lab work.  Fortunately, things are stable.  She continues on Tikosyn.  No chest pain or ischemic symptoms.  No heart failure. Sleep apnea: Stable on CPAP. Osteoarthritis s/p bilateral hips.  Having slightly more pain on the right.  Will eventually need replacement again of the right side.  Last hip replacement on the right was 15 years ago.  She does have orthopedics, Dr. Alvan Dame. Review of systems positive for GERD and constipation: Takes intermittent antacids over-the-counter.  Had recent upper endoscopy which was normal.  Constipation worsened by Tikosyn.  Assessment  1. Annual physical exam   2. Hypothyroidism, postsurgical   3. Persistent atrial fibrillation ([place])   4. Long term current use of anticoagulant   5. Complete heart block ([place])   6. Osteopenia  after menopause   7. Left atrial enlargement   8. Atypical atrial flutter (HCC)   9. OSA (obstructive sleep apnea)   10. Need for immunization against influenza   11. Pacemaker   12. Primary osteoarthritis of both hips      Plan  Female Wellness Visit: Age appropriate Health Maintenance and Prevention measures were discussed with patient. Included topics are cancer screening recommendations, ways to keep healthy (see AVS) including dietary and exercise recommendations, regular eye and dental care, use of seat belts, and avoidance of moderate alcohol use and tobacco use.  Screens are up-to-date.  Recheck bone density next year BMI: discussed patient's BMI and encouraged positive lifestyle modifications to help get to or maintain a target BMI. HM needs and immunizations were addressed and ordered. See below for orders. See HM and immunization section for updates.  Flu shot today Routine labs and screening tests ordered including cmp, cbc and lipids where appropriate. Discussed recommendations regarding Vit D and calcium supplementation (see AVS)  Chronic disease management visit and/or acute problem visit: Hypothyroidism: Clinically euthyroid.  Recheck levels today.  Takes levothyroxine 112 mcg daily Persistent A. fib, enlarged right atrium, history of complete heart block with pacemaker in place: Following along with cardiology.  Stable.  On Eliquis. Osteoarthritis: Manages well.  Minimal pain.  Intermittently and rarely she will use tramadol.  Refilled today.  30 pills will last 1 year. Sleep apnea on CPAP, well-controlled GERD and constipation: Consider use of MiraLAX.  As needed antacids are fine.  Avoid NSAIDs.  Follow up:  Return in about 1 year (around 11/16/2021) for complete physical.  Orders Placed This Encounter  Procedures   Flu Vaccine QUAD High Dose(Fluad)   CBC with Differential/Platelet   Comprehensive metabolic panel   Lipid panel   TSH   Meds ordered this encounter   Medications   traMADol (ULTRAM) 50 MG tablet    Sig: Take 1 tablet (50 mg total) by mouth every 6 (six) hours as needed for moderate pain.    Dispense:  30 tablet    Refill:  0      Body mass index is 34.11 kg/m. Wt Readings from Last 3 Encounters:  11/16/20 217 lb 12.8 oz (98.8 kg)  09/14/20 211 lb 12.8 oz (96.1 kg)  08/13/20 205 lb 0.4 oz (93 kg)     Patient Active Problem List   Diagnosis Date Noted   Complete heart block ([place]) 10/06/2015    Priority: 1.    Overview:  s/p pacemaker    Long term current use of anticoagulant 08/05/2015    Priority: 1.   Pacemaker 10/14/2014    Priority: 1.   Paroxysmal atrial fibrillation ([place]) 10/14/2014    Priority: 1.    S/p ablation 12/2018, Dr. Rayann Heman    Hypothyroidism, postsurgical 01/08/2013    Priority: 1.   OSA (obstructive sleep apnea) 10/20/2017    Priority: 2.    Class: Chronic   Osteopenia after menopause 06/14/2017    Priority: 2.    DEXA 10/2018 [facility]: T = -1.4 lowest, stable osteopenia, recheck 2-3 years.  DEXA 08/2016 [facility]: T = -1.10 radius, -1.0 femur; lumbar spine elevated due to DJD; recheck 2-3 years.    Osteoarthritis of left hip 05/11/2017    Priority: 2.   GERD (gastroesophageal reflux disease) 01/08/2013    Priority: 2.    Overview:  H/o esophageal stricture from nsaids s/p endoscopy 2007, Dr. Penelope Coop    OA (osteoarthritis) 01/08/2013    Priority: 2.   History of peptic ulcer disease - nsaid 01/08/2013    Priority: 2.    Overview:  Due to Nsaids    Diverticulosis of large intestine without hemorrhage 01/04/2016    Priority: 3.   Internal hemorrhoids without complication 82/95/6213    Priority: 3.   AR (allergic rhinitis) 01/08/2013    Priority: 3.   S/P left THA, AA 02/13/2018    Priority: 2.   Left atrial enlargement 11/16/2020   Atrial flutter [facility])    Health Maintenance  Topic Date Due   COVID-19 Vaccine (4 - Booster for Moderna series) 02/24/2020   MAMMOGRAM  11/19/2020   DEXA SCAN   10/28/2021   TETANUS/TDAP  07/02/2023   COLONOSCOPY (Pts 45-70yrs Insurance coverage will need to be confirmed)  12/28/2025   Pneumonia Vaccine 54+ Years old  Completed   INFLUENZA VACCINE  Completed   Hepatitis C Screening  Completed   Zoster Vaccines- Shingrix  Completed   HPV VACCINES  Aged Out   Immunization History  Administered Date(s) Administered   Fluad Quad(high Dose 65+) 10/08/2018, 11/14/2019, 11/16/2020   Hepatitis B, ped/adol 11/24/2009   Influenza, High Dose Seasonal PF 11/01/2016, 10/20/2017   Influenza, Quadrivalent, Recombinant, Inj, Pf 01/08/2013   Influenza, Seasonal, Injecte, Preservative Fre 11/11/2014   Influenza,inj,Quad PF,6+ Mos 12/24/2015   Influenza-Unspecified 11/01/2016   Moderna Sars-Covid-2 Vaccination 02/18/2019, 03/18/2019, 12/30/2019   Pneumococcal Conjugate-13 08/17/2016   Pneumococcal Polysaccharide-23 10/20/2017   Tdap 09/07/2010, 09/07/2010, 07/01/2013   Zoster Recombinat (Shingrix) 07/26/2017, 11/20/2017   Zoster, Live 01/08/2013   We  updated and reviewed the patient's past history in detail and it is documented below. Allergies: Patient is allergic to morphine and related, neomycin, and adhesive [tape]. Past Medical History Patient  has a past medical history of AR (allergic rhinitis), Atrial fibrillation ([place]), Atrial flutter ([place]), Clotting disorder ([place]), Diverticulosis, GERD (gastroesophageal reflux disease), Hiatal hernia, Hiatal hernia, Hypothyroidism, Migraines, Mild sleep apnea, Osteoarthritis, Osteopenia after menopause (06/14/2017), Pacemaker, Paroxysmal A-fib ([place]), PUD (peptic ulcer disease), Sleep apnea, Thyroid nodule, and Transient complete heart block (1996). Past Surgical History Patient  has a past surgical history that includes Thyroidectomy, partial (1986); Tubal ligation (1987); Pacemaker insertion (09/08/1994); Pacemaker generator change (03/01/2007); Hip Arthroplasty (Right, 2008); Total hip arthroplasty (Left, 02/13/2018);  ATRIAL FIBRILLATION ABLATION (N/A, 01/08/2019); Cardioversion (N/A, 01/28/2019); Cardioversion (N/A, 02/27/2019); ATRIAL FIBRILLATION ABLATION (N/A, 04/02/2019); and PPM GENERATOR CHANGEOUT (N/A, 06/04/2020). Family History: Patient family history includes COPD in her brother; Cancer in her maternal grandmother; Diabetes Mellitus II in her maternal uncle; Drug abuse in her brother; Peptic Ulcer in her mother; Rheum arthritis in her mother. Social History:  Patient  reports that she has never smoked. She has never used smokeless tobacco. She reports that she does not drink alcohol and does not use drugs.  Review of Systems: Constitutional: negative for fever or malaise Ophthalmic: negative for photophobia, double vision or loss of vision Cardiovascular: negative for chest pain, dyspnea on exertion, or new LE swelling Respiratory: negative for SOB or persistent cough Gastrointestinal: negative for abdominal pain, change in bowel habits or melena Genitourinary: negative for dysuria or gross hematuria, no abnormal uterine bleeding or disharge Musculoskeletal: negative for new gait disturbance or muscular weakness Integumentary: negative for new or persistent rashes, no breast lumps Neurological: negative for TIA or stroke symptoms Psychiatric: negative for SI or delusions Allergic/Immunologic: negative for hives  Patient Care Team    Relationship Specialty Notifications Start End  Leamon Arnt, MD PCP - General Family Medicine  06/14/17   Melvenia Beam, MD Consulting Physician Neurology  06/14/17   Paralee Cancel, MD Consulting Physician Orthopedic Surgery  06/14/17   [name], MD Consulting Physician Cardiology  06/14/17   Efrain Sella, MD Consulting Physician Gastroenterology  06/14/17     Objective  Vitals: BP 118/70   Pulse 74   Temp 97.7 F (36.5 C) (Temporal)   Ht 5\' 7"  (1.702 m)   Wt 217 lb 12.8 oz (98.8 kg)   SpO2 98%   BMI 34.11 kg/m  General:  Well developed, well  nourished, no acute distress  Psych:  Alert and orientedx3,normal mood and affect HEENT:  Normocephalic, atraumatic, non-icteric sclera,  supple neck without adenopathy, mass or thyromegaly Cardiovascular:  Normal S1, S2, RRR without gallop, rub or murmur Respiratory:  Good breath sounds bilaterally, CTAB with normal respiratory effort Gastrointestinal: normal bowel sounds, soft, non-tender, no noted masses. No HSM MSK: no edema Skin:  Warm, no rashes or suspicious lesions noted Neurologic:    Mental status is normal. gross motor and sensory exams are normal. Normal gait. No tremor   Commons side effects, risks, benefits, and alternatives for medications and treatment plan prescribed today were discussed, and the patient expressed understanding of the given instructions. Patient is instructed to call or message via MyChart if he/she has any questions or concerns regarding our treatment plan. No barriers to understanding were identified. We discussed Red Flag symptoms and signs in detail. Patient expressed understanding regarding what to do in case of urgent or emergency type symptoms.  Medication list  was reconciled, printed and provided to the patient in AVS. Patient instructions and summary information was reviewed with the patient as documented in the AVS. This note was prepared with assistance of Dragon voice recognition software. Occasional wrong-word or sound-a-like substitutions may have occurred due to the inherent limitations of voice recognition software  This visit occurred during the SARS-CoV-2 public health emergency.  Safety protocols were in place, including screening questions prior to the visit, additional usage of staff PPE, and extensive cleaning of exam room while observing appropriate contact time as indicated for disinfecting solutions.

## 2020-11-18 ENCOUNTER — Other Ambulatory Visit: Payer: Self-pay

## 2020-11-18 DIAGNOSIS — D509 Iron deficiency anemia, unspecified: Secondary | ICD-10-CM

## 2020-11-19 ENCOUNTER — Other Ambulatory Visit: Payer: Self-pay

## 2020-11-19 ENCOUNTER — Other Ambulatory Visit (INDEPENDENT_AMBULATORY_CARE_PROVIDER_SITE_OTHER): Payer: Medicare PPO

## 2020-11-19 DIAGNOSIS — D509 Iron deficiency anemia, unspecified: Secondary | ICD-10-CM | POA: Diagnosis not present

## 2020-11-19 LAB — FOLATE: Folate: 14.9 ng/mL (ref >5.9)

## 2020-11-19 LAB — VITAMIN B12: Vitamin B-12: 455 pg/mL (ref 211–911)

## 2020-11-20 LAB — IRON,TIBC AND FERRITIN PANEL
%SAT: 18 % (calc) (ref 16–45)
Ferritin: 18 ng/mL (ref 16–288)
Iron: 67 ug/dL (ref 45–160)
TIBC: 368 mcg/dL (calc) (ref 250–450)

## 2020-11-23 ENCOUNTER — Other Ambulatory Visit: Payer: Self-pay

## 2020-11-23 ENCOUNTER — Ambulatory Visit
Admission: RE | Admit: 2020-11-23 | Discharge: 2020-11-23 | Disposition: A | Discharge status: Home / Self Care | Payer: Medicare PPO | Source: Ambulatory Visit | Attending: Family Medicine | Admitting: Family Medicine

## 2020-11-23 DIAGNOSIS — Z1231 Encounter for screening mammogram for malignant neoplasm of breast: Secondary | ICD-10-CM

## 2020-11-23 MED ORDER — OMEPRAZOLE 20 MG PO CPDR: 20.0000 mg | DELAYED_RELEASE_CAPSULE | Freq: Every day | ORAL | 0 refills | Status: DC

## 2020-11-23 NOTE — Progress Notes (Signed)
Please call patient: I have reviewed his/her lab results. Lab results show borderline low iron but otherwise normal. I recommend starting otc iron once a day and please order omeprazole 20mg  daily to take for 8-12 weeks. thanks

## 2020-11-29 ENCOUNTER — Other Ambulatory Visit: Payer: Self-pay | Admitting: Internal Medicine

## 2020-11-30 NOTE — Telephone Encounter (Signed)
Prescription refill request for Eliquis received. Indication: Afib  Last office visit: 09/14/20 ([place])  Scr: 0.87 (11/16/20) Age: 70 Weight: 98.8kg  Appropriate dose and refill sent to requested pharmacy.

## 2020-12-04 ENCOUNTER — Ambulatory Visit (INDEPENDENT_AMBULATORY_CARE_PROVIDER_SITE_OTHER): Payer: Medicare PPO

## 2020-12-04 ENCOUNTER — Encounter: Payer: Self-pay | Admitting: Family Medicine

## 2020-12-04 ENCOUNTER — Telehealth: Payer: Self-pay

## 2020-12-04 DIAGNOSIS — I442 Atrioventricular block, complete: Secondary | ICD-10-CM | POA: Diagnosis not present

## 2020-12-04 LAB — CUP PACEART REMOTE DEVICE CHECK
Battery Remaining Longevity: 180 mo
Battery Voltage: 3.2 V
Brady Statistic AP VP Percent: 0 %
Brady Statistic AP VS Percent: 0 %
Brady Statistic AS VP Percent: 0.01 %
Brady Statistic AS VS Percent: 99.99 %
Brady Statistic RA Percent Paced: 0 %
Brady Statistic RV Percent Paced: 0.01 %
Date Time Interrogation Session: 20221111073012
Implantable Lead Implant Date: 19960815
Implantable Lead Implant Date: 19960815
Implantable Lead Location: 753859
Implantable Lead Location: 753860
Implantable Lead Model: 5024
Implantable Pulse Generator Implant Date: 20220512
Lead Channel Impedance Value: 323 Ohm
Lead Channel Impedance Value: 323 Ohm
Lead Channel Impedance Value: 437 Ohm
Lead Channel Impedance Value: 456 Ohm
Lead Channel Pacing Threshold Amplitude: 1.5 V
Lead Channel Pacing Threshold Pulse Width: 0.4 ms
Lead Channel Sensing Intrinsic Amplitude: 2.875 mV
Lead Channel Sensing Intrinsic Amplitude: 5.625 mV
Lead Channel Sensing Intrinsic Amplitude: 5.625 mV
Lead Channel Setting Pacing Amplitude: 3 V
Lead Channel Setting Pacing Pulse Width: 0.4 ms
Lead Channel Setting Sensing Sensitivity: 0.9 mV

## 2020-12-04 NOTE — Telephone Encounter (Signed)
Entered in error, message sent to provider

## 2020-12-14 NOTE — Progress Notes (Signed)
Remote pacemaker transmission.   

## 2021-02-28 ENCOUNTER — Other Ambulatory Visit: Payer: Self-pay | Admitting: Internal Medicine

## 2021-03-05 ENCOUNTER — Ambulatory Visit (INDEPENDENT_AMBULATORY_CARE_PROVIDER_SITE_OTHER): Payer: Medicare PPO

## 2021-03-05 DIAGNOSIS — I442 Atrioventricular block, complete: Secondary | ICD-10-CM | POA: Diagnosis not present

## 2021-03-06 LAB — CUP PACEART REMOTE DEVICE CHECK
Battery Remaining Longevity: 177 mo
Battery Voltage: 3.18 V
Brady Statistic AP VP Percent: 0 %
Brady Statistic AP VS Percent: 0 %
Brady Statistic AS VP Percent: 0.01 %
Brady Statistic AS VS Percent: 99.99 %
Brady Statistic RA Percent Paced: 0 %
Brady Statistic RV Percent Paced: 0.01 %
Date Time Interrogation Session: 20230210082558
Implantable Lead Implant Date: 19960815
Implantable Lead Implant Date: 19960815
Implantable Lead Location: 753859
Implantable Lead Location: 753860
Implantable Lead Model: 5024
Implantable Pulse Generator Implant Date: 20220512
Lead Channel Impedance Value: 323 Ohm
Lead Channel Impedance Value: 323 Ohm
Lead Channel Impedance Value: 437 Ohm
Lead Channel Impedance Value: 437 Ohm
Lead Channel Pacing Threshold Amplitude: 1.375 V
Lead Channel Pacing Threshold Pulse Width: 0.4 ms
Lead Channel Sensing Intrinsic Amplitude: 2.875 mV
Lead Channel Sensing Intrinsic Amplitude: 5.75 mV
Lead Channel Sensing Intrinsic Amplitude: 5.75 mV
Lead Channel Setting Pacing Amplitude: 2.75 V
Lead Channel Setting Pacing Pulse Width: 0.4 ms
Lead Channel Setting Sensing Sensitivity: 0.9 mV

## 2021-03-09 ENCOUNTER — Other Ambulatory Visit: Payer: Self-pay | Admitting: Internal Medicine

## 2021-03-09 NOTE — Progress Notes (Signed)
Remote pacemaker transmission.   

## 2021-03-10 NOTE — Progress Notes (Signed)
Cardiology Office Note Date:  03/15/2021  Patient ID:  Kristin [name], Kristin [name] 02/09/1950, MRN [id number] PCP:  Leamon Arnt, MD  Electrophysiologist: Dr. Rayann Heman    Chief Complaint: 6  mo  History of Present Illness: Kristin [name] is a 71 y.o. female with history of CHB/PPM, hypothyroidism, persistent AFib/flutter.  She comes today to be seen for Dr. Rayann Heman, last seen by him 09/14/20, doing well, not dependent with rare VP (programmed VVI 35), HVR adjusted up to 170 Planned for Q55mo visits  TODAY She is doing well No CP, SOB She has infrequent fleeting extra beats. Does not think she has had Afib on Tikosyn. 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  Ablation was performed in this location, however ablation was limited due to phrenic nerve location.  No ablation was delivered near the phrenic nerve. AAD Flecainide started Dec 2020 > stopped May 2021 with AFib Tikosyn started May 2021   Past Medical History:  Diagnosis Date   AR (allergic rhinitis)    Atrial fibrillation (HCC)    Atrial flutter (HCC)    Clotting disorder (HCC)    Diverticulosis    GERD (gastroesophageal reflux disease)    Hiatal hernia    Hiatal hernia    Hypothyroidism    Migraines    Mild sleep apnea    Osteoarthritis    Osteopenia after menopause 06/14/2017   DEXA 08/2016 [facility]: T = -1.10 radius, -1.0 femur; lumbar spine elevated due to DJD; recheck 2-3 years.   Pacemaker    CHB   Paroxysmal A-fib (HCC)    PUD (peptic ulcer disease)     Sleep apnea    Thyroid nodule    Transient complete heart block 1996    Past Surgical History:  Procedure Laterality Date   ATRIAL FIBRILLATION ABLATION N/A 01/08/2019   Procedure: ATRIAL FIBRILLATION ABLATION;  Surgeon: [name], MD;  Location: [place] [facility];  Service: Cardiovascular;  Laterality: N/A;   ATRIAL FIBRILLATION ABLATION N/A 04/02/2019   Procedure: ATRIAL FIBRILLATION ABLATION;  Surgeon: [name], MD;  Location: [place] [facility];  Service: Cardiovascular;  Laterality: N/A;   CARDIOVERSION N/A 01/28/2019   Procedure: CARDIOVERSION;  Surgeon: Donato Heinz, MD;  Location[facility] ENDOSCOPY;  Service: Endoscopy;  Laterality: N/A;   CARDIOVERSION N/A 02/27/2019   Procedure: CARDIOVERSION;  Surgeon: Donato Heinz, MD;  Location: [facility] ENDOSCOPY;  Service: Endoscopy;  Laterality: N/A;   HIP ARTHROPLASTY Right 2008   PACEMAKER GENERATOR CHANGE  03/01/2007   performed by Dr Rosita Fire at [facility] ([place])   PACEMAKER INSERTION  09/08/1994   performed in [facility] for transient complete heart block   PPM GENERATOR CHANGEOUT N/A 06/04/2020   Procedure: [place];  Surgeon: [name], MD;  Location: [place] [facility];  Service: Cardiovascular;  Laterality: N/A;   THYROIDECTOMY, PARTIAL  1986   BENIGN NODULES   TOTAL HIP ARTHROPLASTY Left 02/13/2018   Procedure: TOTAL HIP ARTHROPLASTY ANTERIOR APPROACH;  Surgeon: Paralee Cancel, MD;  Location: [facility];  Service: Orthopedics;  Laterality: Left;  70 mins   TUBAL LIGATION  1987    Current Outpatient Medications  Medication Sig Dispense Refill   acetaminophen (TYLENOL) 500 MG tablet Take 1,000 mg by mouth every 6 (six) hours as needed (for headaches or mild pain).     amoxicillin (AMOXIL) 500 MG capsule Take by mouth as directed. Take 4 caps by mouth 1 hour prior to dental procedures     CALCIUM PO Take 400 mg by mouth daily.     Cholecalciferol (VITAMIN D3) 25 MCG (1000 UT) CAPS Take  1,000 Units by mouth daily.     Cyanocobalamin (B-12) 1000 MCG SUBL Place 1,000 mcg under the tongue every Friday.      diclofenac sodium (VOLTAREN) 1 % GEL Apply 2 g topically 4 (four) times daily as needed. (Patient taking differently: Apply 2 g topically 4 (four) times daily as needed (pain).) 100 g 5   diltiazem (CARDIZEM CD) 120 MG 24 hr capsule TAKE ONE CAPSULE TWICE DAILY. MAY TAKE EXTRA CAPSULE DAILY FOR BREAKTHROUGH AFIB. 225 capsule 2   diltiazem (CARDIZEM) 30 MG tablet Take 1 tablet every 4 hours AS NEEDED for AFIB heart rate >100 (Patient taking differently: Take by mouth See admin instructions. Take 1 tablet every 4 hours AS NEEDED for AFIB heart rate >100) 45 tablet 3   dofetilide (TIKOSYN) 500 MCG capsule TAKE (1) CAPSULE TWICE DAILY. 180 capsule 2   ELIQUIS 5 MG TABS tablet TAKE ONE TABLET BY MOUTH TWICE DAILY 180 tablet 1   ferrous sulfate 325 (65 FE) MG tablet Take 325 mg by mouth daily.     levothyroxine (SYNTHROID) 112 MCG tablet TAKE 1 TABLET BY MOUTH DAILY. 90 tablet 3   Magnesium 250 MG TABS Take 250 mg by mouth every evening.      omeprazole (PRILOSEC) 20 MG capsule Take 1 capsule (20 mg total) by mouth daily. 84 capsule 0   Potassium 99 MG TABS Take 99 mg by mouth daily.      sodium chloride (OCEAN) 0.65 % SOLN nasal spray Place 1 spray into both nostrils daily as needed for congestion.     traMADol (ULTRAM) 50 MG tablet Take 1 tablet (50 mg total) by mouth every 6 (six) hours as needed for moderate pain. 30 tablet 0   vitamin C (ASCORBIC ACID) 500 MG tablet Take 500 mg by mouth daily.     No current facility-administered medications for this visit.    Allergies:   Morphine and related, Neomycin, and Adhesive [tape]   Social History:  The patient  reports that she has never smoked. She has been exposed to tobacco smoke. She has never used smokeless tobacco. She reports that she does not drink alcohol and does not use drugs.   Family History:  The patient's family  history includes COPD in her brother; Cancer in her maternal grandmother; Diabetes Mellitus II in her maternal uncle; Drug abuse in her brother; Peptic Ulcer in her mother; Rheum arthritis in her mother.  ROS:  Please see the history of present illness.    All other systems are reviewed and otherwise negative.   PHYSICAL EXAM:  VS:  BP 118/72    Pulse 77    Ht 5\' 7"  (1.702 m)    Wt 225 lb 12.8 oz (102.4 kg)    SpO2 97%    BMI 35.37 kg/m  BMI: Body mass index is 35.37 kg/m. Well nourished, well developed, in no acute distress HEENT: normocephalic, atraumatic Neck:  no JVD, carotid bruits or masses Cardiac:  RRR; no significant murmurs, no rubs, or gallops Lungs:  CTA b/l, no wheezing, rhonchi or rales Abd: soft, nontender MS: no deformity or atrophy Ext: no edema Skin: warm and dry, no rash Neuro:  No gross deficits appreciated Psych: euthymic mood, full affect  PPM site is stable, no tethering or discomfort   EKG:  Done today and reviewed by myself shows  SR 77bpm, QTc 470ms  Device interrogation done today and reviewed by myself:  Battery is good RV lead threshold up some 1.75/0.4 and 1.24/0.8 No new HVR episodes VP <0.1% RV lead PW was increased to 0.8  11/18/2019: TTE IMPRESSIONS   1. Left ventricular ejection fraction, by estimation, is 60 to 65%. The  left ventricle has normal function. The left ventricle has no regional  wall motion abnormalities. Left ventricular diastolic parameters are  consistent with Grade I diastolic  dysfunction (impaired relaxation). The average left ventricular global  longitudinal strain is -24.3 %. The global longitudinal strain is normal.   2. Right ventricular systolic function is normal. The right ventricular  size is mildly enlarged. There is normal pulmonary artery systolic  pressure. The estimated right ventricular systolic pressure is 53.2 mmHg.   3. Left atrial size was mildly dilated.   4. Right atrial size was mildly dilated.    5. The mitral valve is normal in structure. Mild mitral valve  regurgitation. No evidence of mitral stenosis.   6. Tricuspid valve regurgitation is moderate.   7. The aortic valve is normal in structure. Aortic valve regurgitation is  not visualized. No aortic stenosis is present.   8. The inferior vena cava is normal in size with greater than 50%  respiratory variability, suggesting right atrial pressure of 3 mmHg.   Comparison(s): No significant change from prior study. 07/24/18 EF 60-65%.   04/02/2019: EPS/ablation CONCLUSIONS: 1. Counter clockwise isthmus dependant right atrial flutter upon presentation successfully ablated along the CTI 2. Multiple atypical atrial flutter circuits were observed arising from the right atrium along a posterior free wall scar.  Ablation was performed in this location, however ablation was limited due to phrenic nerve location.  No ablation was delivered near the phrenic nerve. 3. Intracardiac echo reveals a moderately enlarged left atrium with four separate pulmonary veins without evidence of pulmonary vein stenosis. 3. All four PVs were quiescent from a prior ablation and did not require additional ablation today 4. No early apparent complications.  01/08/2019: EPS/Ablation CONCLUSIONS: 1. Sinus rhythm upon presentation.   2. Intracardiac echo reveals a moderate sized left atrium with four separate pulmonary veins without evidence of pulmonary vein stenosis. 3. Successful electrical isolation and anatomical encircling of all four pulmonary veins with radiofrequency current. 4. No inducible arrhythmias following ablation both on and off of Isuprel 5. No early apparent complications.   07/24/2018: TTE IMPRESSIONS  1. The left ventricle has normal systolic function with an ejection  fraction of 60-65%. The cavity size was normal. Left ventricular diastolic  Doppler parameters are consistent with impaired relaxation. No evidence of  left ventricular  regional wall  motion abnormalities.   2. The right ventricle has normal systolic function. The cavity was  normal. There is no increase in right ventricular wall thickness.   3. Left atrial size was mildly dilated.   4. Right atrial size was mildly dilated.   5. Mild mitral valve prolapse.   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 Mild thickening of the mitral valve  leaflet.   7. The aortic valve is tricuspid.   Recent Labs: 09/14/2020: Magnesium 2.1 11/16/2020: ALT 17; BUN 16; Creatinine, Ser 0.87; Hemoglobin 11.5; Platelets 245.0; Potassium 4.2; Sodium 137; TSH 0.94  11/16/2020: Cholesterol 188; HDL 86.50; LDL Cholesterol 86; Total CHOL/HDL Ratio 2; Triglycerides 76.0; VLDL 15.2   CrCl cannot be calculated (Patient's most recent lab result is older than the maximum 21 days allowed.).   Wt Readings from Last 3 Encounters:  03/15/21 225 lb 12.8 oz (102.4 kg)  11/16/20 217 lb 12.8 oz (98.8 kg)  09/14/20 211 lb 12.8 oz (96.1 kg)     Other studies reviewed: Additional studies/records reviewed today include: summarized above  ASSESSMENT AND PLAN:  PPM Intact function, programming as above  Persistent AFib/flutter  CHA2DS2Vasc is 2 (with gender), on Eliquis appropriately dosed Ablation hx as above 0% % burden Tikosyn with good QTc  Labs today  Discussed stressors like surgery may provoke some Afib, would not be unusual, and is not a contraindication to surgery if she needs it  Disposition: F/u with remotes as usual, she asked to be referred/followed by Dr. Lovena Le going forward.  Current medicines are reviewed at length with the patient today.  The patient did not have any concerns regarding medicines.  Kristin Night, PA-C 03/15/2021 11:10 AM     [facility] [place] [address] [phone] (office)  [phone] (fax)

## 2021-03-15 ENCOUNTER — Encounter: Payer: Self-pay | Admitting: Physician Assistant

## 2021-03-15 ENCOUNTER — Other Ambulatory Visit: Payer: Self-pay

## 2021-03-15 ENCOUNTER — Ambulatory Visit: Payer: Medicare PPO | Admitting: Physician Assistant

## 2021-03-15 VITALS — BP 118/72 | HR 77 | Ht 67.0 in | Wt 225.8 lb

## 2021-03-15 DIAGNOSIS — I480 Paroxysmal atrial fibrillation: Secondary | ICD-10-CM

## 2021-03-15 DIAGNOSIS — Z950 Presence of cardiac pacemaker: Secondary | ICD-10-CM

## 2021-03-15 DIAGNOSIS — Z79899 Other long term (current) drug therapy: Secondary | ICD-10-CM | POA: Diagnosis not present

## 2021-03-15 DIAGNOSIS — I4819 Other persistent atrial fibrillation: Secondary | ICD-10-CM

## 2021-03-15 DIAGNOSIS — Z5181 Encounter for therapeutic drug level monitoring: Secondary | ICD-10-CM | POA: Diagnosis not present

## 2021-03-15 LAB — CUP PACEART INCLINIC DEVICE CHECK
Battery Remaining Longevity: 177 mo
Battery Voltage: 3.18 V
Brady Statistic AP VP Percent: 0 %
Brady Statistic AP VS Percent: 0 %
Brady Statistic AS VP Percent: 0.01 %
Brady Statistic AS VS Percent: 99.99 %
Brady Statistic RA Percent Paced: 0 %
Brady Statistic RV Percent Paced: 0.01 %
Date Time Interrogation Session: 20230220142338
Implantable Lead Implant Date: 19960815
Implantable Lead Implant Date: 19960815
Implantable Lead Location: 753859
Implantable Lead Location: 753860
Implantable Lead Model: 5024
Implantable Pulse Generator Implant Date: 20220512
Lead Channel Impedance Value: 323 Ohm
Lead Channel Impedance Value: 323 Ohm
Lead Channel Impedance Value: 437 Ohm
Lead Channel Impedance Value: 494 Ohm
Lead Channel Pacing Threshold Amplitude: 1.5 V
Lead Channel Pacing Threshold Pulse Width: 0.4 ms
Lead Channel Sensing Intrinsic Amplitude: 2.875 mV
Lead Channel Sensing Intrinsic Amplitude: 5.75 mV
Lead Channel Sensing Intrinsic Amplitude: 6.25 mV
Lead Channel Setting Pacing Amplitude: 3 V
Lead Channel Setting Pacing Pulse Width: 0.8 ms
Lead Channel Setting Sensing Sensitivity: 0.9 mV

## 2021-03-15 LAB — CBC
Hematocrit: 38.3 % (ref 34.0–46.6)
Hemoglobin: 12.8 g/dL (ref 11.1–15.9)
MCH: 31 pg (ref 26.6–33.0)
MCHC: 33.4 g/dL (ref 31.5–35.7)
MCV: 93 fL (ref 79–97)
Platelets: 254 10*3/uL (ref 150–450)
RBC: 4.13 x10E6/uL (ref 3.77–5.28)
RDW: 12.8 % (ref 11.7–15.4)
WBC: 5.4 10*3/uL (ref 3.4–10.8)

## 2021-03-15 LAB — BASIC METABOLIC PANEL
BUN/Creatinine Ratio: 18 (ref 12–28)
BUN: 15 mg/dL (ref 8–27)
CO2: 25 mmol/L (ref 20–29)
Calcium: 9.6 mg/dL (ref 8.7–10.3)
Chloride: 100 mmol/L (ref 96–106)
Creatinine, Ser: 0.82 mg/dL (ref 0.57–1.00)
Glucose: 89 mg/dL (ref 70–99)
Potassium: 4.5 mmol/L (ref 3.5–5.2)
Sodium: 141 mmol/L (ref 134–144)
eGFR: 77 mL/min/{1.73_m2} (ref >59)

## 2021-03-15 LAB — MAGNESIUM: Magnesium: 2 mg/dL (ref 1.6–2.3)

## 2021-03-15 NOTE — Patient Instructions (Signed)
Medication Instructions:   Your physician recommends that you continue on your current medications as directed. Please refer to the Current Medication list given to you today.  *If you need a refill on your cardiac medications before your next appointment, please call your pharmacy*   Lab Work: [place]   If you have labs (blood work) drawn today and your tests are completely normal, you will receive your results only by: [place] (if you have MyChart) OR A paper copy in the mail If you have any lab test that is abnormal or we need to change your treatment, we will call you to review the results.   Testing/Procedures: NONE ORDERED  TODAY   Follow-Up: At [facility], you and your health needs are our priority.  As part of our continuing mission to provide you with exceptional heart care, we have created designated Provider Care Teams.  These Care Teams include your primary Cardiologist (physician) and Advanced Practice Providers (APPs -  Physician Assistants and Nurse Practitioners) who all work together to provide you with the care you need, when you need it.  We recommend signing up for the patient portal called "MyChart".  Sign up information is provided on this After Visit Summary.  MyChart is used to connect with patients for Virtual Visits (Telemedicine).  Patients are able to view lab/test results, encounter notes, upcoming appointments, etc.  Non-urgent messages can be sent to your provider as well.   To learn more about what you can do with MyChart, go to [email or web address].    Your next appointment:   6 month(s)  The format for your next appointment:   In Person  Provider:   Cristopher Peru, MD{    Other Instructions

## 2021-05-20 ENCOUNTER — Other Ambulatory Visit: Payer: Self-pay

## 2021-05-20 DIAGNOSIS — I480 Paroxysmal atrial fibrillation: Secondary | ICD-10-CM

## 2021-05-20 MED ORDER — APIXABAN 5 MG PO TABS: 5.0000 mg | ORAL_TABLET | Freq: Two times a day (BID) | ORAL | 1 refills | Status: DC

## 2021-05-20 NOTE — Telephone Encounter (Signed)
Prescription refill request for Eliquis received. Indication: Afib  Last office visit: 03/15/21 Charlcie Cradle) Scr: 0.82 (03/15/21) Age: 71 Weight: 102.4kg  Appropriate dose and refill sent to requested pharmacy.

## 2021-06-04 ENCOUNTER — Ambulatory Visit (INDEPENDENT_AMBULATORY_CARE_PROVIDER_SITE_OTHER): Payer: Medicare PPO

## 2021-06-04 DIAGNOSIS — I442 Atrioventricular block, complete: Secondary | ICD-10-CM

## 2021-06-04 LAB — CUP PACEART REMOTE DEVICE CHECK
Battery Remaining Longevity: 174 mo
Battery Voltage: 3.15 V
Brady Statistic AP VP Percent: 0 %
Brady Statistic AP VS Percent: 0 %
Brady Statistic AS VP Percent: 0.01 %
Brady Statistic AS VS Percent: 99.99 %
Brady Statistic RA Percent Paced: 0 %
Brady Statistic RV Percent Paced: 0.01 %
Date Time Interrogation Session: 20230512085252
Implantable Lead Implant Date: 19960815
Implantable Lead Implant Date: 19960815
Implantable Lead Location: 753859
Implantable Lead Location: 753860
Implantable Lead Model: 5024
Implantable Pulse Generator Implant Date: 20220512
Lead Channel Impedance Value: 323 Ohm
Lead Channel Impedance Value: 342 Ohm
Lead Channel Impedance Value: 437 Ohm
Lead Channel Impedance Value: 532 Ohm
Lead Channel Pacing Threshold Amplitude: 1.375 V
Lead Channel Pacing Threshold Pulse Width: 0.4 ms
Lead Channel Sensing Intrinsic Amplitude: 2.875 mV
Lead Channel Sensing Intrinsic Amplitude: 6.625 mV
Lead Channel Sensing Intrinsic Amplitude: 6.625 mV
Lead Channel Setting Pacing Amplitude: 3.25 V
Lead Channel Setting Pacing Pulse Width: 0.4 ms
Lead Channel Setting Sensing Sensitivity: 0.9 mV

## 2021-06-10 NOTE — Progress Notes (Signed)
Remote pacemaker transmission.   

## 2021-08-26 DIAGNOSIS — H2513 Age-related nuclear cataract, bilateral: Secondary | ICD-10-CM | POA: Diagnosis not present

## 2021-08-27 ENCOUNTER — Other Ambulatory Visit: Payer: Self-pay | Admitting: Family Medicine

## 2021-09-03 ENCOUNTER — Ambulatory Visit (INDEPENDENT_AMBULATORY_CARE_PROVIDER_SITE_OTHER): Payer: Medicare PPO

## 2021-09-03 DIAGNOSIS — I442 Atrioventricular block, complete: Secondary | ICD-10-CM

## 2021-09-03 LAB — CUP PACEART REMOTE DEVICE CHECK
Battery Remaining Longevity: 171 mo
Battery Voltage: 3.12 V
Brady Statistic AP VP Percent: 0 %
Brady Statistic AP VS Percent: 0 %
Brady Statistic AS VP Percent: 0.01 %
Brady Statistic AS VS Percent: 99.99 %
Brady Statistic RA Percent Paced: 0 %
Brady Statistic RV Percent Paced: 0.01 %
Date Time Interrogation Session: 20230811090547
Implantable Lead Implant Date: 19960815
Implantable Lead Implant Date: 19960815
Implantable Lead Location: 753859
Implantable Lead Location: 753860
Implantable Lead Model: 5024
Implantable Pulse Generator Implant Date: 20220512
Lead Channel Impedance Value: 323 Ohm
Lead Channel Impedance Value: 361 Ohm
Lead Channel Impedance Value: 437 Ohm
Lead Channel Impedance Value: 513 Ohm
Lead Channel Pacing Threshold Amplitude: 1.375 V
Lead Channel Pacing Threshold Pulse Width: 0.4 ms
Lead Channel Sensing Intrinsic Amplitude: 2.875 mV
Lead Channel Sensing Intrinsic Amplitude: 5.875 mV
Lead Channel Sensing Intrinsic Amplitude: 5.875 mV
Lead Channel Setting Pacing Amplitude: 2.75 V
Lead Channel Setting Pacing Pulse Width: 0.4 ms
Lead Channel Setting Sensing Sensitivity: 0.9 mV

## 2021-09-17 ENCOUNTER — Other Ambulatory Visit: Payer: Self-pay | Admitting: Orthopedic Surgery

## 2021-09-17 DIAGNOSIS — Z96641 Presence of right artificial hip joint: Secondary | ICD-10-CM | POA: Diagnosis not present

## 2021-09-17 DIAGNOSIS — Z96642 Presence of left artificial hip joint: Secondary | ICD-10-CM | POA: Diagnosis not present

## 2021-09-17 DIAGNOSIS — M25551 Pain in right hip: Secondary | ICD-10-CM | POA: Diagnosis not present

## 2021-09-27 ENCOUNTER — Ambulatory Visit: Payer: Medicare PPO

## 2021-09-28 ENCOUNTER — Ambulatory Visit (INDEPENDENT_AMBULATORY_CARE_PROVIDER_SITE_OTHER): Payer: Medicare PPO

## 2021-09-28 VITALS — Ht 67.0 in | Wt 225.0 lb

## 2021-09-28 DIAGNOSIS — Z0000 Encounter for general adult medical examination without abnormal findings: Secondary | ICD-10-CM

## 2021-09-28 NOTE — Patient Instructions (Addendum)
Kristin [name] , Thank you for taking time to come for your Medicare Wellness Visit. I appreciate your ongoing commitment to your health goals. Please review the following plan we discussed and let me know if I can assist you in the future.   These are the goals we discussed:  Goals      Patient Stated     Stay healthy      Patient Stated     Lose weight         This is a list of the screening recommended for you and due dates:  Health Maintenance  Topic Date Due   COVID-19 Vaccine (4 - Moderna risk series) 02/24/2020   Flu Shot  04/24/2022*   DEXA scan (bone density measurement)  10/28/2021   Mammogram  11/23/2021   Tetanus Vaccine  07/02/2023   Colon Cancer Screening  12/28/2025   Pneumonia Vaccine  Completed   Hepatitis C Screening: USPSTF Recommendation to screen - Ages 18-79 yo.  Completed   Zoster (Shingles) Vaccine  Completed   HPV Vaccine  Aged Out  *Topic was postponed. The date shown is not the original due date.     Preventive Care 16 Years and Older, Female Preventive care refers to lifestyle choices and visits with your health care provider that can promote health and wellness. What does preventive care include? A yearly physical exam. This is also called an annual well check. Dental exams once or twice a year. Routine eye exams. Ask your health care provider how often you should have your eyes checked. Personal lifestyle choices, including: Daily care of your teeth and gums. Regular physical activity. Eating a healthy diet. Avoiding tobacco and drug use. Limiting alcohol use. Practicing safe sex. Taking low-dose aspirin every day. Taking vitamin and mineral supplements as recommended by your health care provider. What happens during an annual well check? The services and screenings done by your health care provider during your annual well check will depend on your age, overall health, lifestyle risk factors, and family history of disease. Counseling  Your  health care provider may ask you questions about your: Alcohol use. Tobacco use. Drug use. Emotional well-being. Home and relationship well-being. Sexual activity. Eating habits. History of falls. Memory and ability to understand (cognition). Work and work Statistician. Reproductive health. Screening  You may have the following tests or measurements: Height, weight, and BMI. Blood pressure. Lipid and cholesterol levels. These may be checked every 5 years, or more frequently if you are over 57 years old. Skin check. Lung cancer screening. You may have this screening every year starting at age 42 if you have a 30-pack-year history of smoking and currently smoke or have quit within the past 15 years. Fecal occult blood test (FOBT) of the stool. You may have this test every year starting at age 65. Flexible sigmoidoscopy or colonoscopy. You may have a sigmoidoscopy every 5 years or a colonoscopy every 10 years starting at age 34. Hepatitis C blood test. Hepatitis B blood test. Sexually transmitted disease (STD) testing. Diabetes screening. This is done by checking your blood sugar (glucose) after you have not eaten for a while (fasting). You may have this done every 1-3 years. Bone density scan. This is done to screen for osteoporosis. You may have this done starting at age 52. Mammogram. This may be done every 1-2 years. Talk to your health care provider about how often you should have regular mammograms. Talk with your health care provider about your test results,  treatment options, and if necessary, the need for more tests. Vaccines  Your health care provider may recommend certain vaccines, such as: Influenza vaccine. This is recommended every year. Tetanus, diphtheria, and acellular pertussis (Tdap, Td) vaccine. You may need a Td booster every 10 years. Zoster vaccine. You may need this after age 15. Pneumococcal 13-valent conjugate (PCV13) vaccine. One dose is recommended after age  54. Pneumococcal polysaccharide (PPSV23) vaccine. One dose is recommended after age 38. Talk to your health care provider about which screenings and vaccines you need and how often you need them. This information is not intended to replace advice given to you by your health care provider. Make sure you discuss any questions you have with your health care provider. Document Released: 02/06/2015 Document Revised: 09/30/2015 Document Reviewed: 11/11/2014 Elsevier Interactive Patient Education  2017 [place] Prevention in the Home Falls can cause injuries. They can happen to people of all ages. There are many things you can do to make your home safe and to help prevent falls. What can I do on the outside of my home? Regularly fix the edges of walkways and driveways and fix any cracks. Remove anything that might make you trip as you walk through a door, such as a raised step or threshold. Trim any bushes or trees on the path to your home. Use bright outdoor lighting. Clear any walking paths of anything that might make someone trip, such as rocks or tools. Regularly check to see if handrails are loose or broken. Make sure that both sides of any steps have handrails. Any raised decks and porches should have guardrails on the edges. Have any leaves, snow, or ice cleared regularly. Use sand or salt on walking paths during winter. Clean up any spills in your garage right away. This includes oil or grease spills. What can I do in the bathroom? Use night lights. Install grab bars by the toilet and in the tub and shower. Do not use towel bars as grab bars. Use non-skid mats or decals in the tub or shower. If you need to sit down in the shower, use a plastic, non-slip stool. Keep the floor dry. Clean up any water that spills on the floor as soon as it happens. Remove soap buildup in the tub or shower regularly. Attach bath mats securely with double-sided non-slip rug tape. Do not have throw  rugs and other things on the floor that can make you trip. What can I do in the bedroom? Use night lights. Make sure that you have a light by your bed that is easy to reach. Do not use any sheets or blankets that are too big for your bed. They should not hang down onto the floor. Have a firm chair that has side arms. You can use this for support while you get dressed. Do not have throw rugs and other things on the floor that can make you trip. What can I do in the kitchen? Clean up any spills right away. Avoid walking on wet floors. Keep items that you use a lot in easy-to-reach places. If you need to reach something above you, use a strong step stool that has a grab bar. Keep electrical cords out of the way. Do not use floor polish or wax that makes floors slippery. If you must use wax, use non-skid floor wax. Do not have throw rugs and other things on the floor that can make you trip. What can I do with my stairs? Do  not leave any items on the stairs. Make sure that there are handrails on both sides of the stairs and use them. Fix handrails that are broken or loose. Make sure that handrails are as long as the stairways. Check any carpeting to make sure that it is firmly attached to the stairs. Fix any carpet that is loose or worn. Avoid having throw rugs at the top or bottom of the stairs. If you do have throw rugs, attach them to the floor with carpet tape. Make sure that you have a light switch at the top of the stairs and the bottom of the stairs. If you do not have them, ask someone to add them for you. What else can I do to help prevent falls? Wear shoes that: Do not have high heels. Have rubber bottoms. Are comfortable and fit you well. Are closed at the toe. Do not wear sandals. If you use a stepladder: Make sure that it is fully opened. Do not climb a closed stepladder. Make sure that both sides of the stepladder are locked into place. Ask someone to hold it for you, if  possible. Clearly mark and make sure that you can see: Any grab bars or handrails. First and last steps. Where the edge of each step is. Use tools that help you move around (mobility aids) if they are needed. These include: Canes. Walkers. Scooters. Crutches. Turn on the lights when you go into a dark area. Replace any light bulbs as soon as they burn out. Set up your furniture so you have a clear path. Avoid moving your furniture around. If any of your floors are uneven, fix them. If there are any pets around you, be aware of where they are. Review your medicines with your doctor. Some medicines can make you feel dizzy. This can increase your chance of falling. Ask your doctor what other things that you can do to help prevent falls. This information is not intended to replace advice given to you by your health care provider. Make sure you discuss any questions you have with your health care provider. Document Released: 11/06/2008 Document Revised: 06/18/2015 Document Reviewed: 02/14/2014 Elsevier Interactive Patient Education  2017 [place]. Opioid Pain Medicine Management Opioids are powerful medicines that are used to treat moderate to severe pain. When used for short periods of time, they can help you to: Sleep better. Do better in physical or occupational therapy. Feel better in the first few days after an injury. Recover from surgery. Opioids should be taken with the supervision of a trained health care provider. They should be taken for the shortest period of time possible. This is because opioids can be addictive, and the longer you take opioids, the greater your risk of addiction. This addiction can also be called opioid use disorder. What are the risks? Using opioid pain medicines for longer than 3 days increases your risk of side effects. Side effects include: Constipation. Nausea and vomiting. Breathing difficulties (respiratory  depression). Drowsiness. Confusion. Opioid use disorder. Itching. Taking opioid pain medicine for a long period of time can affect your ability to do daily tasks. It also puts you at risk for: Motor vehicle crashes. Depression. Suicide. Heart attack. Overdose, which can be life-threatening. What is a pain treatment plan? A pain treatment plan is an agreement between you and your health care provider. Pain is unique to each person, and treatments vary depending on your condition. To manage your pain, you and your health care provider need to work  together. To help you do this: Discuss the goals of your treatment, including how much pain you might expect to have and how you will manage the pain. Review the risks and benefits of taking opioid medicines. Remember that a good treatment plan uses more than one approach and minimizes the chance of side effects. Be honest about the amount of medicines you take and about any drug or alcohol use. Get pain medicine prescriptions from only one health care provider. Pain can be managed with many types of alternative treatments. Ask your health care provider to refer you to one or more specialists who can help you manage pain through: Physical or occupational therapy. Counseling (cognitive behavioral therapy). Good nutrition. Biofeedback. Massage. Meditation. Non-opioid medicine. Following a gentle exercise program. How to use opioid pain medicine Taking medicine Take your pain medicine exactly as told by your health care provider. Take it only when you need it. If your pain gets less severe, you may take less than your prescribed dose if your health care provider approves. If you are not having pain, do nottake pain medicine unless your health care provider tells you to take it. If your pain is severe, do nottry to treat it yourself by taking more pills than instructed on your prescription. Contact your health care provider for help. Write down  the times when you take your pain medicine. It is easy to become confused while on pain medicine. Writing the time can help you avoid overdose. Take other over-the-counter or prescription medicines only as told by your health care provider. Keeping yourself and others safe  While you are taking opioid pain medicine: Do not drive, use machinery, or power tools. Do not sign legal documents. Do not drink alcohol. Do not take sleeping pills. Do not supervise children by yourself. Do not do activities that require climbing or being in high places. Do not go to a lake, river, ocean, spa, or swimming pool. Do not share your pain medicine with anyone. Keep pain medicine in a locked cabinet or in a secure area where pets and children cannot reach it. Stopping your use of opioids If you have been taking opioid medicine for more than a few weeks, you may need to slowly decrease (taper) how much you take until you stop completely. Tapering your use of opioids can decrease your risk of symptoms of withdrawal, such as: Pain and cramping in the abdomen. Nausea. Sweating. Sleepiness. Restlessness. Uncontrollable shaking (tremors). Cravings for the medicine. Do not attempt to taper your use of opioids on your own. Talk with your health care provider about how to do this. Your health care provider may prescribe a step-down schedule based on how much medicine you are taking and how long you have been taking it. Getting rid of leftover pills Do not save any leftover pills. Get rid of leftover pills safely by: Taking the medicine to a prescription take-back program. This is usually offered by the county or law enforcement. Bringing them to a pharmacy that has a drug disposal container. Flushing them down the toilet. Check the label or package insert of your medicine to see whether this is safe to do. Throwing them out in the trash. Check the label or package insert of your medicine to see whether this is  safe to do. If it is safe to throw it out, remove the medicine from the original container, put it into a sealable bag or container, and mix it with used coffee grounds, food scraps, dirt, or  cat litter before putting it in the trash. Follow these instructions at home: Activity Do exercises as told by your health care provider. Avoid activities that make your pain worse. Return to your normal activities as told by your health care provider. Ask your health care provider what activities are safe for you. General instructions You may need to take these actions to prevent or treat constipation: Drink enough fluid to keep your urine pale yellow. Take over-the-counter or prescription medicines. Eat foods that are high in fiber, such as beans, whole grains, and fresh fruits and vegetables. Limit foods that are high in fat and processed sugars, such as fried or sweet foods. Keep all follow-up visits. This is important. Where to find support If you have been taking opioids for a long time, you may benefit from receiving support for quitting from a local support group or counselor. Ask your health care provider for a referral to these resources in your area. Where to find more information Centers for Disease Control and Prevention (CDC): http://www.wolf.info/ [place] Food and Drug Administration (FDA): [place] Get help right away if: You may have taken too much of an opioid (overdosed). Common symptoms of an overdose: Your breathing is slower or more shallow than normal. You have a very slow heartbeat (pulse). You have slurred speech. You have nausea and vomiting. Your pupils become very small. You have other potential symptoms: You are very confused. You faint or feel like you will faint. You have cold, clammy skin. You have blue lips or fingernails. You have thoughts of harming yourself or harming others. These symptoms may represent a serious problem that is an emergency. Do not wait to see if the  symptoms will go away. Get medical help right away. Call your local emergency services (911 in the [place] Do not drive yourself to the hospital.  If you ever feel like you may hurt yourself or others, or have thoughts about taking your own life, get help right away. Go to your nearest emergency department or: Call your local emergency services (911 in the [place] Call the [facility] 747-863-5097 in the [place] Call a suicide crisis helpline, such as the [facility] at (802) 020-9554 or 988 in the [place]. This is open 24 hours a day in the [place] Text the Crisis Text Line at 928-719-3603 (in the [place].). Summary Opioid medicines can help you manage moderate to severe pain for a short period of time. A pain treatment plan is an agreement between you and your health care provider. Discuss the goals of your treatment, including how much pain you might expect to have and how you will manage the pain. If you think that you or someone else may have taken too much of an opioid, get medical help right away. This information is not intended to replace advice given to you by your health care provider. Make sure you discuss any questions you have with your health care provider. Document Revised: 08/05/2020 Document Reviewed: 04/22/2020 Elsevier Patient Education  [place].

## 2021-09-28 NOTE — Progress Notes (Signed)
Subjective:   Kristin [name] is a 71 y.o. female who presents for Medicare Annual (Subsequent) preventive examination.  Review of Systems    No ROS.  Medicare Wellness Virtual Visit.  Visual/audio telehealth visit, UTA vital signs.   See social history for additional risk factors.   Cardiac Risk Factors include: advanced age (>69mn, >>61women)     Objective:    Today's Vitals   09/28/21 1150  Weight: 225 lb (102.1 kg)  Height: '5\' 7"'$  (1.702 m)   Body mass index is 35.24 kg/m.     09/28/2021   11:57 AM 09/10/2020   11:51 AM 09/05/2019   11:49 AM 05/29/2019    7:50 PM 05/13/2019    2:39 PM 04/02/2019    6:02 AM 02/27/2019   11:46 AM  Advanced Directives  Does Patient Have a Medical Advance Directive? Yes Yes Yes Yes No Yes Yes  Type of AParamedicof AHyattvilleLiving will Healthcare Power of [place]Living will HPark CityLiving will  HWest LineLiving will HNew [place]Living will  Does patient want to make changes to medical advance directive? No - Patient declined   No - Patient declined  No - Patient declined   Copy of H[place]in Chart? Yes - validated most recent copy scanned in chart (See row information) Yes - validated most recent copy scanned in chart (See row information) Yes - validated most recent copy scanned in chart (See row information) No - copy requested  No - copy requested Yes - validated most recent copy scanned in chart (See row information)  Would patient like information on creating a medical advance directive?     No - Patient declined      Current Medications (verified) Outpatient Encounter Medications as of 09/28/2021  Medication Sig   acetaminophen (TYLENOL) 500 MG tablet Take 1,000 mg by mouth every 6 (six) hours as needed (for headaches or mild pain).   amoxicillin (AMOXIL) 500 MG capsule Take by mouth as directed. Take 4 caps by  mouth 1 hour prior to dental procedures   apixaban (ELIQUIS) 5 MG TABS tablet Take 1 tablet (5 mg total) by mouth 2 (two) times daily.   CALCIUM PO Take 400 mg by mouth daily.   Cholecalciferol (VITAMIN D3) 25 MCG (1000 UT) CAPS Take 1,000 Units by mouth daily.   Cyanocobalamin (B-12) 1000 MCG SUBL Place 1,000 mcg under the tongue every Friday.    diclofenac sodium (VOLTAREN) 1 % GEL Apply 2 g topically 4 (four) times daily as needed. (Patient taking differently: Apply 2 g topically 4 (four) times daily as needed (pain).)   diltiazem (CARDIZEM CD) 120 MG 24 hr capsule TAKE ONE CAPSULE TWICE DAILY. MAY TAKE EXTRA CAPSULE DAILY FOR BREAKTHROUGH AFIB.   diltiazem (CARDIZEM) 30 MG tablet Take 1 tablet every 4 hours AS NEEDED for AFIB heart rate >100 (Patient taking differently: Take by mouth See admin instructions. Take 1 tablet every 4 hours AS NEEDED for AFIB heart rate >100)   dofetilide (TIKOSYN) 500 MCG capsule TAKE (1) CAPSULE TWICE DAILY.   ferrous sulfate 325 (65 FE) MG tablet Take 325 mg by mouth daily.   levothyroxine (SYNTHROID) 112 MCG tablet TAKE 1 TABLET BY MOUTH DAILY.   Magnesium 250 MG TABS Take 250 mg by mouth every evening.    omeprazole (PRILOSEC) 20 MG capsule Take 1 capsule (20 mg total) by mouth daily.   Potassium 99 MG  TABS Take 99 mg by mouth daily.    sodium chloride (OCEAN) 0.65 % SOLN nasal spray Place 1 spray into both nostrils daily as needed for congestion.   traMADol (ULTRAM) 50 MG tablet Take 1 tablet (50 mg total) by mouth every 6 (six) hours as needed for moderate pain.   vitamin C (ASCORBIC ACID) 500 MG tablet Take 500 mg by mouth daily.   No facility-administered encounter medications on file as of 09/28/2021.    Allergies (verified) Morphine and related, Neomycin, and Adhesive [tape]   History: Past Medical History:  Diagnosis Date   AR (allergic rhinitis)    Atrial fibrillation (HCC)    Atrial flutter (HCC)    Clotting disorder (HCC)    Diverticulosis     GERD (gastroesophageal reflux disease)    Hiatal hernia    Hiatal hernia    Hypothyroidism    Migraines    Mild sleep apnea    Osteoarthritis    Osteopenia after menopause 06/14/2017   DEXA 08/2016 [facility]: T = -1.10 radius, -1.0 femur; lumbar spine elevated due to DJD; recheck 2-3 years.   Pacemaker    CHB   Paroxysmal A-fib (HCC)    PUD (peptic ulcer disease)    Sleep apnea    Thyroid nodule    Transient complete heart block 1996   Past Surgical History:  Procedure Laterality Date   ATRIAL FIBRILLATION ABLATION N/A 01/08/2019   Procedure: ATRIAL FIBRILLATION ABLATION;  Surgeon: [name], MD;  Location: [place] [facility];  Service: Cardiovascular;  Laterality: N/A;   ATRIAL FIBRILLATION ABLATION N/A 04/02/2019   Procedure: ATRIAL FIBRILLATION ABLATION;  Surgeon: [name], MD;  Location: [place] [facility];  Service: Cardiovascular;  Laterality: N/A;   CARDIOVERSION N/A 01/28/2019   Procedure: CARDIOVERSION;  Surgeon: Donato Heinz, MD;  Location: [facility] ENDOSCOPY;  Service: Endoscopy;  Laterality: N/A;   CARDIOVERSION N/A 02/27/2019   Procedure: CARDIOVERSION;  Surgeon: Donato Heinz, MD;  Location: [facility] ENDOSCOPY;  Service: Endoscopy;  Laterality: N/A;   HIP ARTHROPLASTY Right 2008   PACEMAKER GENERATOR CHANGE  03/01/2007   performed by Dr Rosita Fire at [facility] ([place])   PACEMAKER INSERTION  09/08/1994   performed in [facility] for transient complete heart block   PPM GENERATOR CHANGEOUT N/A 06/04/2020   Procedure: [place];  Surgeon: [name], MD;  Location: [place] [facility];  Service: Cardiovascular;  Laterality: N/A;   THYROIDECTOMY, PARTIAL  1986   BENIGN NODULES   TOTAL HIP ARTHROPLASTY Left 02/13/2018   Procedure: TOTAL HIP ARTHROPLASTY ANTERIOR APPROACH;  Surgeon: Paralee Cancel, MD;  Location: [facility];  Service: Orthopedics;  Laterality: Left;  70 mins   TUBAL LIGATION  1987   Family History  Problem Relation Age of  Onset   Diabetes Mellitus II Maternal Uncle    Cancer Maternal Grandmother        ENDOMETRIAL   Rheum arthritis Mother    Peptic Ulcer Mother    COPD Brother    Drug abuse Brother    Breast cancer Neg Hx    Social History   Socioeconomic History   Marital status: Married    Spouse name: Not on file   Number of children: 0   Years of education: Not on file   Highest education level: Not on file  Occupational History   Occupation: retired  Tobacco Use   Smoking status: Never    Passive exposure: Past   Smokeless tobacco: Never  Media planner  Vaping Use: Never used  Substance and Sexual Activity   Alcohol use: Never    Alcohol/week: 1.0 standard drink of alcohol    Types: 1 Glasses of wine per week   Drug use: No   Sexual activity: Not Currently  Other Topics Concern   Not on file  Social History Narrative   [employer]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   Married, no children   Social Determinants of Health   Financial Resource Strain: Low Risk  (09/28/2021)   Overall Financial Resource Strain (CARDIA)    Difficulty of Paying Living Expenses: Not hard at all  Food Insecurity: No Food Insecurity (09/28/2021)   Hunger Vital Sign    Worried About Running Out of Food in the Last Year: Never true    Ran Out of Food in the Last Year: Never true  Transportation Needs: No Transportation Needs (09/28/2021)   PRAPARE - Hydrologist (Medical): No    Lack of Transportation (Non-Medical): No  Physical Activity: Insufficiently Active (09/28/2021)   Exercise Vital Sign    Days of Exercise per Week: 4 days    Minutes of Exercise per Session: 20 min  Stress: No Stress Concern Present (09/10/2020)   [place]    Feeling of Stress : Not at all  Social Connections: Unknown (09/28/2021)   Social Connection and Isolation Panel [NHANES]    Frequency of Communication with Friends and Family: Not on  file    Frequency of Social Gatherings with Friends and Family: Not on file    Attends Religious Services: Not on file    Active Member of Clubs or Organizations: Not on file    Attends Archivist Meetings: Not on file    Marital Status: Married    Tobacco Counseling Counseling given: Not Answered   Clinical Intake:  Pre-visit preparation completed: Yes        Diabetes: No  How often do you need to have someone help you when you read instructions, pamphlets, or other written materials from your doctor or pharmacy?: 1 - Never    Interpreter Needed?: No    Activities of Daily Living    09/28/2021   11:44 AM  In your present state of health, do you have any difficulty performing the following activities:  Hearing? 0  Vision? 0  Difficulty concentrating or making decisions? 0  Walking or climbing stairs? 1  Comment Paces self with walking.  Dressing or bathing? 0  Doing errands, shopping? 0  Preparing Food and eating ? N  Using the Toilet? N  In the past six months, have you accidently leaked urine? N  Do you have problems with loss of bowel control? N  Managing your Medications? N  Managing your Finances? N  Housekeeping or managing your Housekeeping? N    Patient Care Team: Leamon Arnt, MD as PCP - General (Family Medicine) Melvenia Beam, MD as Consulting Physician (Neurology) Paralee Cancel, MD as Consulting Physician (Orthopedic Surgery) [name], MD as Consulting Physician (Cardiology) Efrain Sella, MD as Consulting Physician (Gastroenterology)  Indicate any recent Medical Services you may have received from other than [facility] providers in the past year (date may be approximate).     Assessment:   This is a routine wellness examination for Aliene.  Virtual Visit via Telephone Note  I connected with  Avice Funchess Masur on 09/28/21 at 11:45 AM EDT by telephone and  verified that I am speaking with the correct person using two  identifiers.  Location: Patient: home Provider: office Persons participating in the virtual visit: patient/Nurse Health Advisor   I discussed the limitations of performing an evaluation and management service by telehealth. We continued and completed visit with audio only. Some vital signs may be absent or patient reported.   Hearing/Vision screen Hearing Screening - Comments:: Pt denies any hearing issues  Vision Screening - Comments:: Pt follows up with [facility] for annual eye exams  Dietary issues and exercise activities discussed: Current Exercise Habits: Home exercise routine, Type of exercise: walking, Intensity: Mild   Goals Addressed             This Visit's Progress    Patient Stated   On track    Stay healthy        Depression Screen    09/28/2021   11:46 AM 09/10/2020   11:50 AM 08/13/2020    9:11 AM 11/14/2019    9:32 AM 09/05/2019   11:46 AM 10/08/2018    8:22 AM 06/14/2017    9:01 AM  PHQ 2/9 Scores  PHQ - 2 Score 0 0 0 0 0 0 0  PHQ- 9 Score       0    Fall Risk    09/28/2021   11:46 AM 09/10/2020   11:52 AM 08/13/2020    9:11 AM 11/14/2019    9:32 AM 09/05/2019   11:50 AM  Fall Risk   Falls in the past year? 0 0 0 0 0  Number falls in past yr: 0 0  0 0  Injury with Fall?  0  0 0  Risk for fall due to :  Impaired vision No Fall Risks  Impaired vision  Follow up Falls evaluation completed Falls prevention discussed   Falls prevention discussed    FALL RISK PREVENTION PERTAINING TO THE HOME: Home free of loose throw rugs in walkways, pet beds, electrical cords, etc? Yes  Adequate lighting in your home to reduce risk of falls? Yes   ASSISTIVE DEVICES UTILIZED TO PREVENT FALLS: Life alert? No  Use of a cane, walker or w/c? No   TIMED UP AND GO: Was the test performed? No .   Cognitive Function:        09/28/2021   11:47 AM 09/10/2020   11:55 AM  6CIT Screen  What Year? 0 points 0 points  What month? 0 points 0 points  What  time? 0 points 0 points  Count back from 20 0 points 0 points  Months in reverse 0 points 0 points  Repeat phrase 0 points 0 points  Total Score 0 points 0 points    Immunizations Immunization History  Administered Date(s) Administered   Fluad Quad(high Dose 65+) 10/08/2018, 11/14/2019, 11/16/2020   Hepatitis B, ped/adol 11/24/2009   Influenza, High Dose Seasonal PF 11/01/2016, 10/20/2017   Influenza, Quadrivalent, Recombinant, Inj, Pf 01/08/2013   Influenza, Seasonal, Injecte, Preservative Fre 11/11/2014   Influenza,inj,Quad PF,6+ Mos 12/24/2015   Influenza-Unspecified 11/01/2016   Moderna Sars-Covid-2 Vaccination 02/18/2019, 03/18/2019, 12/30/2019   Pneumococcal Conjugate-13 08/17/2016   Pneumococcal Polysaccharide-23 10/20/2017   Tdap 09/07/2010, 09/07/2010, 07/01/2013   Zoster Recombinat (Shingrix) 07/26/2017, 11/20/2017   Zoster, Live 01/08/2013   Screening Tests Health Maintenance  Topic Date Due   COVID-19 Vaccine (4 - Moderna risk series) 02/24/2020   INFLUENZA VACCINE  04/24/2022 (Originally 08/24/2021)   DEXA SCAN  10/28/2021   MAMMOGRAM  11/23/2021  TETANUS/TDAP  07/02/2023   COLONOSCOPY (Pts 45-34yr Insurance coverage will need to be confirmed)  12/28/2025   Pneumonia Vaccine 70 Years old  Completed   Hepatitis C Screening  Completed   Zoster Vaccines- Shingrix  Completed   HPV VACCINES  Aged Out   Health Maintenance Health Maintenance Due  Topic Date Due   COVID-19 Vaccine (4 - Moderna risk series) 02/24/2020   Lung Cancer Screening: (Low Dose CT Chest recommended if Age 70-80years, 30 pack-year currently smoking OR have quit w/in 15years.) does not qualify.   Vision Screening: Recommended annual ophthalmology exams for early detection of glaucoma and other disorders of the eye.  Dental Screening: Recommended annual dental exams for proper oral hygiene  Community Resource Referral / Chronic Care Management: CRR required this visit?  No   CCM required  this visit?  No      Plan:     I have personally reviewed and noted the following in the patient's chart:   Medical and social history Use of alcohol, tobacco or illicit drugs  Current medications and supplements including opioid prescriptions. Patient is currently taking opioid prescriptions. Information provided to patient regarding non-opioid alternatives. Patient advised to discuss non-opioid treatment plan with their provider. Taking Tramadol. Followed by PCP.  Functional ability and status Nutritional status Physical activity Advanced directives List of other physicians Hospitalizations, surgeries, and ER visits in previous 12 months Vitals Screenings to include cognitive, depression, and falls Referrals and appointments  In addition, I have reviewed and discussed with patient certain preventive protocols, quality metrics, and best practice recommendations. A written personalized care plan for preventive services as well as general preventive health recommendations were provided to patient.     OVarney Biles LPN   90/06/2692

## 2021-09-28 NOTE — Progress Notes (Signed)
Remote pacemaker transmission.   

## 2021-09-30 ENCOUNTER — Ambulatory Visit
Admission: RE | Admit: 2021-09-30 | Discharge: 2021-09-30 | Disposition: A | Discharge status: Home / Self Care | Payer: Medicare PPO | Source: Ambulatory Visit | Attending: Orthopedic Surgery | Admitting: Orthopedic Surgery

## 2021-09-30 ENCOUNTER — Encounter: Payer: Self-pay | Admitting: Internal Medicine

## 2021-09-30 ENCOUNTER — Ambulatory Visit: Payer: Medicare PPO | Attending: Internal Medicine | Admitting: Internal Medicine

## 2021-09-30 VITALS — BP 124/70 | HR 85 | Ht 67.0 in | Wt 213.6 lb

## 2021-09-30 DIAGNOSIS — I442 Atrioventricular block, complete: Secondary | ICD-10-CM

## 2021-09-30 DIAGNOSIS — Z471 Aftercare following joint replacement surgery: Secondary | ICD-10-CM | POA: Diagnosis not present

## 2021-09-30 DIAGNOSIS — Z950 Presence of cardiac pacemaker: Secondary | ICD-10-CM | POA: Diagnosis not present

## 2021-09-30 DIAGNOSIS — Z96641 Presence of right artificial hip joint: Secondary | ICD-10-CM

## 2021-09-30 DIAGNOSIS — I4892 Unspecified atrial flutter: Secondary | ICD-10-CM

## 2021-09-30 NOTE — Progress Notes (Signed)
HPI Kristin [name] returns today for EP followup. She is a 71 yo woman with a h/o remote heart block s/p PPM insertion almost 30 years ago. She has had persistent atrial fib, who has undergone ablation and dofetilide. She has mostly maintained NSR. She denies syncope or chest pain. No edema. She has had some trouble with weight gain. She has not lost any weight since her last EP visit a year ago. She has occaisional palpitations. Her PM was reprogrammed VVI 35.  Allergies  Allergen Reactions   Morphine And Related Hives and Itching   Neomycin Itching   Adhesive [Tape] Rash and Other (See Comments)    "Blistering rash"     Current Outpatient Medications  Medication Sig Dispense Refill   acetaminophen (TYLENOL) 500 MG tablet Take 1,000 mg by mouth every 6 (six) hours as needed (for headaches or mild pain).     amoxicillin (AMOXIL) 500 MG capsule Take by mouth as directed. Take 4 caps by mouth 1 hour prior to dental procedures     apixaban (ELIQUIS) 5 MG TABS tablet Take 1 tablet (5 mg total) by mouth 2 (two) times daily. 180 tablet 1   CALCIUM PO Take 400 mg by mouth daily.     Cholecalciferol (VITAMIN D3) 25 MCG (1000 UT) CAPS Take 1,000 Units by mouth daily.     Cyanocobalamin (B-12) 1000 MCG SUBL Place 1,000 mcg under the tongue every Friday.      diclofenac sodium (VOLTAREN) 1 % GEL Apply 2 g topically 4 (four) times daily as needed. (Patient taking differently: Apply 2 g topically 4 (four) times daily as needed (pain).) 100 g 5   diltiazem (CARDIZEM CD) 120 MG 24 hr capsule TAKE ONE CAPSULE TWICE DAILY. MAY TAKE EXTRA CAPSULE DAILY FOR BREAKTHROUGH AFIB. 225 capsule 2   dofetilide (TIKOSYN) 500 MCG capsule TAKE (1) CAPSULE TWICE DAILY. 180 capsule 2   levothyroxine (SYNTHROID) 112 MCG tablet TAKE 1 TABLET BY MOUTH DAILY. 90 tablet 0   Magnesium 250 MG TABS Take 250 mg by mouth every evening.      omeprazole (PRILOSEC) 20 MG capsule Take 1 capsule (20 mg total) by mouth daily. 84  capsule 0   Potassium 99 MG TABS Take 99 mg by mouth daily.      sodium chloride (OCEAN) 0.65 % SOLN nasal spray Place 1 spray into both nostrils daily as needed for congestion.     traMADol (ULTRAM) 50 MG tablet Take 1 tablet (50 mg total) by mouth every 6 (six) hours as needed for moderate pain. 30 tablet 0   vitamin C (ASCORBIC ACID) 500 MG tablet Take 500 mg by mouth daily.     No current facility-administered medications for this visit.     Past Medical History:  Diagnosis Date   AR (allergic rhinitis)    Atrial fibrillation (HCC)    Atrial flutter (HCC)    Clotting disorder (HCC)    Diverticulosis    GERD (gastroesophageal reflux disease)    Hiatal hernia    Hiatal hernia    Hypothyroidism    Migraines    Mild sleep apnea    Osteoarthritis    Osteopenia after menopause 06/14/2017   DEXA 08/2016 [facility]: T = -1.10 radius, -1.0 femur; lumbar spine elevated due to DJD; recheck 2-3 years.   Pacemaker    CHB   Paroxysmal A-fib (HCC)    PUD (peptic ulcer disease)    Sleep apnea    Thyroid nodule  Transient complete heart block 1996    ROS:   All systems reviewed and negative except as noted in the HPI.   Past Surgical History:  Procedure Laterality Date   ATRIAL FIBRILLATION ABLATION N/A 01/08/2019   Procedure: ATRIAL FIBRILLATION ABLATION;  Surgeon: [name], MD;  Location: [place] [facility];  Service: Cardiovascular;  Laterality: N/A;   ATRIAL FIBRILLATION ABLATION N/A 04/02/2019   Procedure: ATRIAL FIBRILLATION ABLATION;  Surgeon: [name], MD;  Location: [place] [facility];  Service: Cardiovascular;  Laterality: N/A;   CARDIOVERSION N/A 01/28/2019   Procedure: CARDIOVERSION;  Surgeon: Donato Heinz, MD;  Location: [facility] ENDOSCOPY;  Service: Endoscopy;  Laterality: N/A;   CARDIOVERSION N/A 02/27/2019   Procedure: CARDIOVERSION;  Surgeon: Donato Heinz, MD;  Location: [facility] ENDOSCOPY;  Service: Endoscopy;  Laterality: N/A;   HIP ARTHROPLASTY Right  2008   PACEMAKER GENERATOR CHANGE  03/01/2007   performed by Dr Rosita Fire at [facility] ([place])   PACEMAKER INSERTION  09/08/1994   performed in [facility] for transient complete heart block   PPM GENERATOR CHANGEOUT N/A 06/04/2020   Procedure: [place];  Surgeon: [name], MD;  Location: [place] [facility];  Service: Cardiovascular;  Laterality: N/A;   THYROIDECTOMY, PARTIAL  1986   BENIGN NODULES   TOTAL HIP ARTHROPLASTY Left 02/13/2018   Procedure: TOTAL HIP ARTHROPLASTY ANTERIOR APPROACH;  Surgeon: Paralee Cancel, MD;  Location: [facility];  Service: Orthopedics;  Laterality: Left;  70 mins   TUBAL LIGATION  1987     Family History  Problem Relation Age of Onset   Diabetes Mellitus II Maternal Uncle    Cancer Maternal Grandmother        ENDOMETRIAL   Rheum arthritis Mother    Peptic Ulcer Mother    COPD Brother    Drug abuse Brother    Breast cancer Neg Hx      Social History   Socioeconomic History   Marital status: Married    Spouse name: Not on file   Number of children: 0   Years of education: Not on file   Highest education level: Not on file  Occupational History   Occupation: retired  Tobacco Use   Smoking status: Never    Passive exposure: Past   Smokeless tobacco: Never  Vaping Use   Vaping Use: Never used  Substance and Sexual Activity   Alcohol use: Never    Alcohol/week: 1.0 standard drink of alcohol    Types: 1 Glasses of wine per week   Drug use: No   Sexual activity: Not Currently  Other Topics Concern   Not on file  Social History Narrative   [employer]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   Married, no children   Social Determinants of Health   Financial Resource Strain: Low Risk  (09/28/2021)   Overall Financial Resource Strain (CARDIA)    Difficulty of Paying Living Expenses: Not hard at all  Food Insecurity: No Food Insecurity (09/28/2021)   Hunger Vital Sign    Worried About Running Out of Food in the Last Year:  Never true    Ran Out of Food in the Last Year: Never true  Transportation Needs: No Transportation Needs (09/28/2021)   PRAPARE - Hydrologist (Medical): No    Lack of Transportation (Non-Medical): No  Physical Activity: Insufficiently Active (09/28/2021)   Exercise Vital Sign    Days of Exercise per Week: 4 days  Minutes of Exercise per Session: 20 min  Stress: No Stress Concern Present (09/10/2020)   [place]    Feeling of Stress : Not at all  Social Connections: Unknown (09/28/2021)   Social Connection and Isolation Panel [NHANES]    Frequency of Communication with Friends and Family: Not on file    Frequency of Social Gatherings with Friends and Family: Not on file    Attends Religious Services: Not on file    Active Member of Clubs or Organizations: Not on file    Attends Archivist Meetings: Not on file    Marital Status: Married  Intimate Partner Violence: Not At Risk (09/28/2021)   Humiliation, Afraid, Rape, and Kick questionnaire    Fear of Current or Ex-Partner: No    Emotionally Abused: No    Physically Abused: No    Sexually Abused: No     BP 124/70   Pulse 85   Ht '5\' 7"'$  (1.702 m)   Wt 213 lb 9.6 oz (96.9 kg)   SpO2 98%   BMI 33.45 kg/m   Physical Exam:  Well appearing but overweight woman, NAD HEENT: Unremarkable Neck:  No JVD, no thyromegally Lymphatics:  No adenopathy Back:  No CVA tenderness Lungs:  Clear with no wheezes HEART:  Regular rate rhythm, no murmurs, no rubs, no clicks Abd:  soft, positive bowel sounds, no organomegally, no rebound, no guarding Ext:  2 plus pulses, no edema, no cyanosis, no clubbing Skin:  No rashes no nodules Neuro:  CN II through XII intact, motor grossly intact  EKG - nsr  DEVICE  Normal device function.  See PaceArt for details.   Assess/Plan:  Remote CHB - she is conducting with no exceptions. We will  follow. Persistent atrial fib - she is maintaining NSR very nicely on dofetilide. Continue. Obesity - she is encouraged to lose weight.

## 2021-09-30 NOTE — Patient Instructions (Addendum)
Medication Instructions:  Your physician recommends that you continue on your current medications as directed. Please refer to the Current Medication list given to you today.  *If you need a refill on your cardiac medications before your next appointment, please call your pharmacy*  Lab Work: You will need to have blood work done today:  BMET  Testing/Procedures: None ordered.  Follow-Up: At [facility], you and your health needs are our priority.  As part of our continuing mission to provide you with exceptional heart care, we have created designated Provider Care Teams.  These Care Teams include your primary Cardiologist (physician) and Advanced Practice Providers (APPs -  Physician Assistants and Nurse Practitioners) who all work together to provide you with the care you need, when you need it.  We recommend signing up for the patient portal called "MyChart".  Sign up information is provided on this After Visit Summary.  MyChart is used to connect with patients for Virtual Visits (Telemedicine).  Patients are able to view lab/test results, encounter notes, upcoming appointments, etc.  Non-urgent messages can be sent to your provider as well.   To learn more about what you can do with MyChart, go to [email or web address].    Your next appointment:   1 year follow up with Dr. Cristopher Peru.  The format for your next appointment:   In Person  Provider:   Cristopher Peru, MD{or one of the following Advanced Practice Providers on your designated Care Team:   Tommye Standard, [place] Legrand Como "Jonni Sanger" Chalmers Cater, [place]  Remote monitoring is used to monitor your Pacemaker from home. This monitoring reduces the number of office visits required to check your device to one time per year. It allows [place] to keep an eye on the functioning of your device to ensure it is working properly. You are scheduled for a device check from home on 12/03/21. You may send your transmission at any time that day. If you have a  wireless device, the transmission will be sent automatically. After your physician reviews your transmission, you will receive a postcard with your next transmission date.  Important Information About Sugar

## 2021-10-01 LAB — BASIC METABOLIC PANEL
BUN/Creatinine Ratio: 18 (ref 12–28)
BUN: 16 mg/dL (ref 8–27)
CO2: 22 mmol/L (ref 20–29)
Calcium: 9.8 mg/dL (ref 8.7–10.3)
Chloride: 99 mmol/L (ref 96–106)
Creatinine, Ser: 0.88 mg/dL (ref 0.57–1.00)
Glucose: 109 mg/dL — ABNORMAL HIGH (ref 70–99)
Potassium: 4.4 mmol/L (ref 3.5–5.2)
Sodium: 138 mmol/L (ref 134–144)
eGFR: 71 mL/min/{1.73_m2} (ref >59)

## 2021-10-13 DIAGNOSIS — T8484XA Pain due to internal orthopedic prosthetic devices, implants and grafts, initial encounter: Secondary | ICD-10-CM | POA: Diagnosis not present

## 2021-10-13 DIAGNOSIS — Z96641 Presence of right artificial hip joint: Secondary | ICD-10-CM | POA: Diagnosis not present

## 2021-10-13 DIAGNOSIS — M25551 Pain in right hip: Secondary | ICD-10-CM | POA: Diagnosis not present

## 2021-10-15 ENCOUNTER — Other Ambulatory Visit: Payer: Self-pay | Admitting: Family Medicine

## 2021-10-15 DIAGNOSIS — Z1231 Encounter for screening mammogram for malignant neoplasm of breast: Secondary | ICD-10-CM

## 2021-10-28 ENCOUNTER — Ambulatory Visit: Payer: Medicare PPO | Admitting: Internal Medicine

## 2021-11-17 ENCOUNTER — Ambulatory Visit (INDEPENDENT_AMBULATORY_CARE_PROVIDER_SITE_OTHER): Payer: Medicare PPO | Admitting: Family Medicine

## 2021-11-17 ENCOUNTER — Encounter: Payer: Self-pay | Admitting: Family Medicine

## 2021-11-17 VITALS — BP 120/70 | HR 76 | Temp 97.8°F | Ht 67.0 in | Wt 206.8 lb

## 2021-11-17 DIAGNOSIS — Z0000 Encounter for general adult medical examination without abnormal findings: Secondary | ICD-10-CM

## 2021-11-17 DIAGNOSIS — Z23 Encounter for immunization: Secondary | ICD-10-CM

## 2021-11-17 DIAGNOSIS — K219 Gastro-esophageal reflux disease without esophagitis: Secondary | ICD-10-CM | POA: Diagnosis not present

## 2021-11-17 DIAGNOSIS — I442 Atrioventricular block, complete: Secondary | ICD-10-CM

## 2021-11-17 DIAGNOSIS — E890 Postprocedural hypothyroidism: Secondary | ICD-10-CM

## 2021-11-17 DIAGNOSIS — Z0001 Encounter for general adult medical examination with abnormal findings: Secondary | ICD-10-CM | POA: Diagnosis not present

## 2021-11-17 DIAGNOSIS — M8580 Other specified disorders of bone density and structure, unspecified site: Secondary | ICD-10-CM | POA: Diagnosis not present

## 2021-11-17 DIAGNOSIS — Z780 Asymptomatic menopausal state: Secondary | ICD-10-CM | POA: Diagnosis not present

## 2021-11-17 DIAGNOSIS — G4733 Obstructive sleep apnea (adult) (pediatric): Secondary | ICD-10-CM

## 2021-11-17 DIAGNOSIS — H00015 Hordeolum externum left lower eyelid: Secondary | ICD-10-CM

## 2021-11-17 DIAGNOSIS — Z950 Presence of cardiac pacemaker: Secondary | ICD-10-CM

## 2021-11-17 DIAGNOSIS — D500 Iron deficiency anemia secondary to blood loss (chronic): Secondary | ICD-10-CM

## 2021-11-17 DIAGNOSIS — I480 Paroxysmal atrial fibrillation: Secondary | ICD-10-CM

## 2021-11-17 DIAGNOSIS — Z7901 Long term (current) use of anticoagulants: Secondary | ICD-10-CM | POA: Diagnosis not present

## 2021-11-17 LAB — LIPID PANEL
Cholesterol: 183 mg/dL (ref 0–200)
HDL: 74.6 mg/dL (ref >39.00)
LDL Cholesterol: 87 mg/dL (ref 0–99)
NonHDL: 108.78
Total CHOL/HDL Ratio: 2
Triglycerides: 111 mg/dL (ref 0.0–149.0)
VLDL: 22.2 mg/dL (ref 0.0–40.0)

## 2021-11-17 LAB — COMPREHENSIVE METABOLIC PANEL
ALT: 13 U/L (ref 0–35)
AST: 21 U/L (ref 0–37)
Albumin: 4.2 g/dL (ref 3.5–5.2)
Alkaline Phosphatase: 106 U/L (ref 39–117)
BUN: 12 mg/dL (ref 6–23)
CO2: 28 mEq/L (ref 19–32)
Calcium: 9.7 mg/dL (ref 8.4–10.5)
Chloride: 98 mEq/L (ref 96–112)
Creatinine, Ser: 0.7 mg/dL (ref 0.40–1.20)
GFR: 87.66 mL/min (ref >60.00)
Glucose, Bld: 94 mg/dL (ref 70–99)
Potassium: 4.5 mEq/L (ref 3.5–5.1)
Sodium: 134 mEq/L — ABNORMAL LOW (ref 135–145)
Total Bilirubin: 0.6 mg/dL (ref 0.2–1.2)
Total Protein: 6.9 g/dL (ref 6.0–8.3)

## 2021-11-17 LAB — CBC WITH DIFFERENTIAL/PLATELET
Basophils Absolute: 0.1 10*3/uL (ref 0.0–0.1)
Basophils Relative: 0.9 % (ref 0.0–3.0)
Eosinophils Absolute: 0.1 10*3/uL (ref 0.0–0.7)
Eosinophils Relative: 1.8 % (ref 0.0–5.0)
HCT: 34.7 % — ABNORMAL LOW (ref 36.0–46.0)
Hemoglobin: 11.5 g/dL — ABNORMAL LOW (ref 12.0–15.0)
Lymphocytes Relative: 21.1 % (ref 12.0–46.0)
Lymphs Abs: 1.4 10*3/uL (ref 0.7–4.0)
MCHC: 33 g/dL (ref 30.0–36.0)
MCV: 93.9 fl (ref 78.0–100.0)
Monocytes Absolute: 0.5 10*3/uL (ref 0.1–1.0)
Monocytes Relative: 7.2 % (ref 3.0–12.0)
Neutro Abs: 4.6 10*3/uL (ref 1.4–7.7)
Neutrophils Relative %: 69 % (ref 43.0–77.0)
Platelets: 318 10*3/uL (ref 150.0–400.0)
RBC: 3.7 Mil/uL — ABNORMAL LOW (ref 3.87–5.11)
RDW: 13.4 % (ref 11.5–15.5)
WBC: 6.7 10*3/uL (ref 4.0–10.5)

## 2021-11-17 LAB — TSH: TSH: 0.89 u[IU]/mL (ref 0.35–5.50)

## 2021-11-17 MED ORDER — OMEPRAZOLE 20 MG PO CPDR: 20.0000 mg | DELAYED_RELEASE_CAPSULE | Freq: Every day | ORAL | 3 refills | Status: DC

## 2021-11-17 MED ORDER — TRAMADOL HCL 50 MG PO TABS: 50.0000 mg | ORAL_TABLET | Freq: Four times a day (QID) | ORAL | 2 refills | Status: DC | PRN

## 2021-11-17 MED ORDER — ERYTHROMYCIN 5 MG/GM OP OINT: 1.0000 | TOPICAL_OINTMENT | Freq: Three times a day (TID) | OPHTHALMIC | 0 refills | Status: DC

## 2021-11-17 NOTE — Patient Instructions (Signed)
Please return in 12 months for your annual complete physical; please come fasting.   I will release your lab results to you on your MyChart account with further instructions. You may see the results before I do, but when I review them I will send you a message with my report or have my assistant call you if things need to be discussed. Please reply to my message with any questions. Thank you!   If you have any questions or concerns, please don't hesitate to send me a message via MyChart or call the office at [phone]. Thank you for visiting with [place] today! It's our pleasure caring for you.   I have ordered a mammogram and/or bone density for you as we discussed today: '[]'$   Mammogram  '[x]'$   Bone Density  Please call the office checked below to schedule your appointment:  '[x]'$   [facility]      [address] [place], [place]         '[]'$   [facility]  [address] [place], [place]

## 2021-11-17 NOTE — Progress Notes (Signed)
Subjective  Chief Complaint  Patient presents with   Annual Exam    Pt here for Annual exam and is currently fasting     HPI: Kristin [name] is a 71 y.o. female who presents to [facility] [place] today for a Female Wellness Visit. She also has the concerns and/or needs as listed above in the chief complaint. These will be addressed in addition to the Health Maintenance Visit.   Wellness Visit: annual visit with health maintenance review and exam without Pap  HM: screens: mammo scheduled for next month. CRC screen current. Due for DEXA: f/u osteopenia. Imms flu shot today. Doing well overall.  Chronic disease f/u and/or acute problem visit: (deemed necessary to be done in addition to the wellness visit): Cardiology: reviewed notes; stable paf and CHB w/ pacer. On eloquis. No bleeds. No cp. No sob Low thyroid: has been well controlled on levothyroxine 178mg daily. Feels well. Due for recheck GERD: treated x 3 months with PPI and improved however still with occ reflux sxs.  Iron def anemia: treated with iron x 3 months w/ improvement on f/u testing per cards. Suspect gastritis related.  OSA mild w/o cpap due to intolerance and mild disease; testing was done a few years ago when she was heavier and was snoring. No sxs of sleep apnea now. Needs left hip replacement due to wearing down of old replacement: thinking about having it done in January. Ultram prn.  Left lower lid with raised red area mildly sore today. No vision problems or red eye  Assessment  1. Annual physical exam   2. [place] heart block ([place]   3. Hypothyroidism, postsurgical   4. Long term current use of anticoagulant   5. Pacemaker   6. Paroxysmal atrial fibrillation (HCC)   7. Gastroesophageal reflux disease, unspecified whether esophagitis present   8. Osteopenia after menopause   9. OSA (obstructive sleep apnea)   10. Iron deficiency anemia due to chronic blood loss   11. Asymptomatic  menopausal state   12. Need for immunization against influenza   13. Hordeolum externum left lower eyelid      Plan  Female Wellness Visit: Age appropriate Health Maintenance and Prevention measures were discussed with patient. Included topics are cancer screening recommendations, ways to keep healthy (see AVS) including dietary and exercise recommendations, regular eye and dental care, use of seat belts, and avoidance of moderate alcohol use and tobacco use.  BMI: discussed patient's BMI and encouraged positive lifestyle modifications to help get to or maintain a target BMI. HM needs and immunizations were addressed and ordered. See below for orders. See HM and immunization section for updates. Routine labs and screening tests ordered including cmp, cbc and lipids where appropriate. Discussed recommendations regarding Vit D and calcium supplementation (see AVS)  Chronic disease management visit and/or acute problem visit: Cardiology status is stable. On eloquis and cardizem and tikosyn. Monitor lytes and renal function GERD: refilled PPI to use daily or as needed per sxs. Education given. OSA: mild/ untreated Osteopenia: recheck Hypothyroidism: recheck. Clinically well controlled on levothyroxine 112 daily.  Treat stye with emycin ointment Recheck IDA; treat gerd Left hip replacement: will be medically cleared if labs are stable. Refilled ultram.   Follow up: 12 mo for cpe  Orders Placed This Encounter  Procedures   DG Bone Density   Flu Vaccine QUAD High Dose(Fluad)   Iron, TIBC and Ferritin Panel   Lipid panel   TSH   CBC  with Differential/Platelet   Comprehensive metabolic panel   Meds ordered this encounter  Medications   traMADol (ULTRAM) 50 MG tablet    Sig: Take 1 tablet (50 mg total) by mouth every 6 (six) hours as needed for moderate pain.    Dispense:  30 tablet    Refill:  2   omeprazole (PRILOSEC) 20 MG capsule    Sig: Take 1 capsule (20 mg total) by mouth daily.     Dispense:  90 capsule    Refill:  3   erythromycin ophthalmic ointment    Sig: Place 1 Application into the right eye 3 (three) times daily.    Dispense:  3.5 g    Refill:  0      Body mass index is 32.39 kg/m. Wt Readings from Last 3 Encounters:  11/17/21 206 lb 12.8 oz (93.8 kg)  09/30/21 213 lb 9.6 oz (96.9 kg)  09/28/21 225 lb (102.1 kg)     Patient Active Problem List   Diagnosis Date Noted   Complete heart block ([place]) 10/06/2015    Priority: High    Overview:  s/p pacemaker    Long term current use of anticoagulant 08/05/2015    Priority: High   Pacemaker 10/14/2014    Priority: High   Paroxysmal atrial fibrillation ([place]) 10/14/2014    Priority: High    S/p ablation 12/2018, Dr. Rayann Heman    Hypothyroidism, postsurgical 01/08/2013    Priority: High   OSA (obstructive sleep apnea) 10/20/2017    Priority: Medium     Class: Chronic    Mild; did not tolerate cpap    Osteopenia after menopause 06/14/2017    Priority: Medium     DEXA 10/2018 [facility]: T = -1.4 lowest, stable osteopenia, recheck 2-3 years.  DEXA 08/2016 [facility]: T = -1.10 radius, -1.0 femur; lumbar spine elevated due to DJD; recheck 2-3 years.    Osteoarthritis of left hip 05/11/2017    Priority: Medium    GERD (gastroesophageal reflux disease) 01/08/2013    Priority: Medium     EGD 2021: normal. 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 Penelope Coop    OA (osteoarthritis) 01/08/2013    Priority: Medium    History of peptic ulcer disease - nsaid 01/08/2013    Priority: Medium     Overview:  Due to Nsaids    Diverticulosis of large intestine without hemorrhage 01/04/2016    Priority: Low   Internal hemorrhoids without complication 27/51/7001    Priority: Low   AR (allergic rhinitis) 01/08/2013    Priority: Low   S/P left THA, AA 02/13/2018    Priority: 2.   Left atrial enlargement 11/16/2020   Atrial flutter [facility])    Health Maintenance  Topic Date Due   DEXA SCAN   10/28/2021   COVID-19 Vaccine (4 - Moderna risk series) 12/03/2021 (Originally 02/24/2020)   MAMMOGRAM  11/23/2021   Medicare Annual Wellness (AWV)  10/29/2022   TETANUS/TDAP  07/02/2023   COLONOSCOPY (Pts 45-30yr Insurance coverage will need to be confirmed)  12/28/2025   Pneumonia Vaccine 70 Years old  Completed   INFLUENZA VACCINE  Completed   Hepatitis C Screening  Completed   Zoster Vaccines- Shingrix  Completed   HPV VACCINES  Aged Out   Immunization History  Administered Date(s) Administered   Fluad Quad(high Dose 65+) 10/08/2018, 11/14/2019, 11/16/2020, 11/17/2021   Hepatitis B, PED/ADOLESCENT 11/24/2009   Influenza, High Dose Seasonal PF 11/01/2016, 10/20/2017   Influenza, Quadrivalent, Recombinant, Inj, Pf 01/08/2013  Influenza, Seasonal, Injecte, Preservative Fre 11/11/2014   Influenza,inj,Quad PF,6+ Mos 12/24/2015   Influenza-Unspecified 11/01/2016   Moderna Sars-Covid-2 Vaccination 02/18/2019, 03/18/2019, 12/30/2019   Pneumococcal Conjugate-13 08/17/2016   Pneumococcal Polysaccharide-23 10/20/2017   Tdap 09/07/2010, 09/07/2010, 07/01/2013   Zoster Recombinat (Shingrix) 07/26/2017, 11/20/2017   Zoster, Live 01/08/2013   We updated and reviewed the patient's past history in detail and it is documented below. Allergies: Patient is allergic to morphine and related, neomycin, and adhesive [tape]. Past Medical History Patient  has a past medical history of AR (allergic rhinitis), Atrial fibrillation ([place]), Atrial flutter ([place]), Clotting disorder ([place]), Diverticulosis, GERD (gastroesophageal reflux disease), Hiatal hernia, Hiatal hernia, Hypothyroidism, Migraines, Mild sleep apnea, Osteoarthritis, Osteopenia after menopause (06/14/2017), Pacemaker, Paroxysmal A-fib ([place]), PUD (peptic ulcer disease), Sleep apnea, Thyroid nodule, and Transient complete heart block (1996). Past Surgical History Patient  has a past surgical history that includes Thyroidectomy, partial (1986);  Tubal ligation (1987); Pacemaker insertion (09/08/1994); Pacemaker generator change (03/01/2007); Hip Arthroplasty (Right, 2008); Total hip arthroplasty (Left, 02/13/2018); ATRIAL FIBRILLATION ABLATION (N/A, 01/08/2019); Cardioversion (N/A, 01/28/2019); Cardioversion (N/A, 02/27/2019); ATRIAL FIBRILLATION ABLATION (N/A, 04/02/2019); and PPM GENERATOR CHANGEOUT (N/A, 06/04/2020). Family History: Patient family history includes COPD in her brother; Cancer in her maternal grandmother; Diabetes Mellitus II in her maternal uncle; Drug abuse in her brother; Peptic Ulcer in her mother; Rheum arthritis in her mother. Social History:  Patient  reports that she has never smoked. She has been exposed to tobacco smoke. She has never used smokeless tobacco. She reports that she does not drink alcohol and does not use drugs.  Review of Systems: Constitutional: negative for fever or malaise Ophthalmic: negative for photophobia, double vision or loss of vision Cardiovascular: negative for chest pain, dyspnea on exertion, or new LE swelling Respiratory: negative for SOB or persistent cough Gastrointestinal: negative for abdominal pain, change in bowel habits or melena Genitourinary: negative for dysuria or gross hematuria, no abnormal uterine bleeding or disharge Musculoskeletal: negative for new gait disturbance or muscular weakness Integumentary: negative for new or persistent rashes, no breast lumps Neurological: negative for TIA or stroke symptoms Psychiatric: negative for SI or delusions Allergic/Immunologic: negative for hives  Patient Care Team    Relationship Specialty Notifications Start End  Leamon Arnt, MD PCP - General Family Medicine  06/14/17   Melvenia Beam, MD Consulting Physician Neurology  06/14/17   Paralee Cancel, MD Consulting Physician Orthopedic Surgery  06/14/17   [name], MD Consulting Physician Cardiology  06/14/17   Efrain Sella, MD Consulting Physician Gastroenterology   06/14/17     Objective  Vitals: BP 120/70   Pulse 76   Temp 97.8 F (36.6 C)   Ht '5\' 7"'$  (1.702 m)   Wt 206 lb 12.8 oz (93.8 kg)   SpO2 (!) 9%   BMI 32.39 kg/m  General:  Well developed, well nourished, no acute distress  Psych:  Alert and orientedx3,normal mood and affect HEENT:  Normocephalic, atraumatic, non-icteric sclera,  supple neck without adenopathy, mass or thyromegaly Cardiovascular:  Normal S1, S2, RRR without gallop, rub or murmur Respiratory:  Good breath sounds bilaterally, CTAB with normal respiratory effort Gastrointestinal: normal bowel sounds,mild midepigastric ttp no rebound or guarding, soft, non-tender, no noted masses. No HSM MSK: no deformities, contusions. Joints are without erythema or swelling.  Skin:  Warm, no rashes or suspicious lesions noted Neuro: no tremor   Commons side effects, risks, benefits, and alternatives for medications and treatment plan prescribed today were discussed, and the  patient expressed understanding of the given instructions. Patient is instructed to call or message via MyChart if he/she has any questions or concerns regarding our treatment plan. No barriers to understanding were identified. We discussed Red Flag symptoms and signs in detail. Patient expressed understanding regarding what to do in case of urgent or emergency type symptoms.  Medication list was reconciled, printed and provided to the patient in AVS. Patient instructions and summary information was reviewed with the patient as documented in the AVS. This note was prepared with assistance of Dragon voice recognition software. Occasional wrong-word or sound-a-like substitutions may have occurred due to the inherent limitations of voice recognition software

## 2021-11-18 ENCOUNTER — Encounter: Payer: Medicare PPO | Admitting: Family Medicine

## 2021-11-18 ENCOUNTER — Other Ambulatory Visit: Payer: Self-pay | Admitting: Internal Medicine

## 2021-11-18 DIAGNOSIS — I480 Paroxysmal atrial fibrillation: Secondary | ICD-10-CM

## 2021-11-18 LAB — IRON,TIBC AND FERRITIN PANEL
%SAT: 19 % (calc) (ref 16–45)
Ferritin: 85 ng/mL (ref 16–288)
Iron: 65 ug/dL (ref 45–160)
TIBC: 335 mcg/dL (calc) (ref 250–450)

## 2021-11-18 NOTE — Telephone Encounter (Signed)
Prescription refill request for Eliquis received.  Indication: afib  Last office visit: Kristin [name] 09/30/2021 Scr: 0.7, 11/17/2021 Age: 71 yo  Weight: 93.8 kg   Refill sent.

## 2021-11-22 ENCOUNTER — Other Ambulatory Visit: Payer: Self-pay | Admitting: Family Medicine

## 2021-11-22 DIAGNOSIS — Z780 Asymptomatic menopausal state: Secondary | ICD-10-CM

## 2021-11-22 DIAGNOSIS — M8580 Other specified disorders of bone density and structure, unspecified site: Secondary | ICD-10-CM

## 2021-11-24 ENCOUNTER — Ambulatory Visit
Admission: RE | Admit: 2021-11-24 | Discharge: 2021-11-24 | Disposition: A | Discharge status: Home / Self Care | Payer: Medicare PPO | Source: Ambulatory Visit | Attending: Family Medicine | Admitting: Family Medicine

## 2021-11-24 ENCOUNTER — Other Ambulatory Visit: Payer: Self-pay | Admitting: Family Medicine

## 2021-11-24 ENCOUNTER — Other Ambulatory Visit: Payer: Self-pay | Admitting: Internal Medicine

## 2021-11-24 DIAGNOSIS — Z1231 Encounter for screening mammogram for malignant neoplasm of breast: Secondary | ICD-10-CM | POA: Diagnosis not present

## 2021-11-30 ENCOUNTER — Telehealth: Payer: Self-pay

## 2021-11-30 NOTE — Telephone Encounter (Signed)
..     Pre-operative Risk Assessment    Patient Name: Kristin [name]  DOB: 1950-05-15 MRN: [id number]      Request for Surgical Clearance    Procedure:   right total hip arthroplasty revision  Date of Surgery:  Clearance 02/10/22                                 Surgeon:  Dr. Paralee Cancel Surgeon's Group or Practice Name:  [facility] Phone number:  [phone] Fax number:  [phone]   Type of Clearance Requested:   - Medical  - Pharmacy:  Hold Apixaban (Eliquis)     Type of Anesthesia:  Spinal   Additional requests/questions:    Gwenlyn Found   11/30/2021, 9:01 AM

## 2021-12-01 NOTE — Telephone Encounter (Signed)
     Primary Cardiologist: [name], MD  Chart reviewed as part of pre-operative protocol coverage. Given past medical history and time since last visit, based on ACC/AHA guidelines, AMARIONNA ARCA would be at acceptable risk for the planned procedure without further cardiovascular testing.   Her RCRI is a class II risk, 0.9% risk of major cardiac event.  Patient with diagnosis of PAF(paroxysmal atrial fibrillation) on Eliquis for anticoagulation.     Procedure: right total hip arthroplasty revision - spinal anesthesia  Date of procedure: 02/10/2022     CHA2DS2-VASc Score = 2   This indicates a 2.2% annual risk of stroke. The patient's score is based upon: CHF History: 0 HTN History: 0 Diabetes History: 0 Stroke History: 0 Vascular Disease History: 0 Age Score: 1 Gender Score: 1       CrCl 88 mL/min (Adj BW)(based on SrCr 0.70  on 11/17/2021 Platelet count 318 (11/17/2021)     Per office protocol, patient can hold Eliquis for 3 days prior to procedure.   Patient will not need bridging with Lovenox (enoxaparin) around procedure.   I will route this recommendation to the requesting party via Epic fax function and remove from pre-op pool.  Please call with questions.  Jossie Ng. Sinjin Amero NP-C     12/01/2021, 3:45 PM [place] [address] Office [phone] Fax [phone]

## 2021-12-01 NOTE — Telephone Encounter (Signed)
Patient with diagnosis of PAF(paroxysmal atrial fibrillation) on Eliquis for anticoagulation.    Procedure: right total hip arthroplasty revision - spinal anesthesia  Date of procedure: 02/10/2022   CHA2DS2-VASc Score = 2   This indicates a 2.2% annual risk of stroke. The patient's score is based upon: CHF History: 0 HTN History: 0 Diabetes History: 0 Stroke History: 0 Vascular Disease History: 0 Age Score: 1 Gender Score: 1      CrCl 88 mL/min (Adj BW)(based on SrCr 0.70  on 11/17/2021 Platelet count 318 (11/17/2021)   Per office protocol, patient can hold Eliquis for 3 days prior to procedure.   Patient will not need bridging with Lovenox (enoxaparin) around procedure.  **This guidance is not considered finalized until pre-operative APP has relayed final recommendations.**

## 2021-12-10 ENCOUNTER — Ambulatory Visit (INDEPENDENT_AMBULATORY_CARE_PROVIDER_SITE_OTHER): Payer: Medicare PPO

## 2021-12-10 DIAGNOSIS — I442 Atrioventricular block, complete: Secondary | ICD-10-CM

## 2021-12-10 LAB — CUP PACEART REMOTE DEVICE CHECK
Battery Remaining Longevity: 168 mo
Battery Voltage: 3.08 V
Brady Statistic AP VP Percent: 0 %
Brady Statistic AP VS Percent: 0 %
Brady Statistic AS VP Percent: 0.01 %
Brady Statistic AS VS Percent: 99.99 %
Brady Statistic RA Percent Paced: 0 %
Brady Statistic RV Percent Paced: 0.01 %
Date Time Interrogation Session: 20231117080556
Implantable Lead Connection Status: 753985
Implantable Lead Connection Status: 753985
Implantable Lead Implant Date: 19960815
Implantable Lead Implant Date: 19960815
Implantable Lead Location: 753859
Implantable Lead Location: 753860
Implantable Lead Model: 5024
Implantable Pulse Generator Implant Date: 20220512
Lead Channel Impedance Value: 323 Ohm
Lead Channel Impedance Value: 323 Ohm
Lead Channel Impedance Value: 399 Ohm
Lead Channel Impedance Value: 418 Ohm
Lead Channel Pacing Threshold Amplitude: 1.5 V
Lead Channel Pacing Threshold Pulse Width: 0.4 ms
Lead Channel Sensing Intrinsic Amplitude: 2.875 mV
Lead Channel Sensing Intrinsic Amplitude: 5.875 mV
Lead Channel Sensing Intrinsic Amplitude: 5.875 mV
Lead Channel Setting Pacing Amplitude: 3 V
Lead Channel Setting Pacing Pulse Width: 0.4 ms
Lead Channel Setting Sensing Sensitivity: 0.9 mV
Zone Setting Status: 755011

## 2021-12-28 NOTE — Progress Notes (Signed)
Remote pacemaker transmission.   

## 2022-01-03 ENCOUNTER — Ambulatory Visit: Payer: Medicare PPO | Admitting: Family Medicine

## 2022-01-03 ENCOUNTER — Encounter: Payer: Self-pay | Admitting: Family Medicine

## 2022-01-03 VITALS — BP 110/58 | HR 80 | Temp 98.1°F | Ht 67.0 in | Wt 208.4 lb

## 2022-01-03 DIAGNOSIS — N950 Postmenopausal bleeding: Secondary | ICD-10-CM | POA: Diagnosis not present

## 2022-01-03 DIAGNOSIS — N952 Postmenopausal atrophic vaginitis: Secondary | ICD-10-CM | POA: Diagnosis not present

## 2022-01-03 DIAGNOSIS — N362 Urethral caruncle: Secondary | ICD-10-CM

## 2022-01-03 NOTE — Patient Instructions (Signed)
We will call you with an appointment for a pelvic ultrasound.   Postmenopausal Bleeding Postmenopausal bleeding is any bleeding that a woman has after she has entered menopause. Menopause is the end of a woman's fertile years. After menopause, a woman no longer ovulates and does not have menstrual periods. Therefore, she should no longer have bleeding from her vagina. Postmenopausal bleeding may have various causes, including: Menopausal hormone therapy (MHT). Endometrial atrophy. After menopause, low estrogen hormone levels cause the membrane that lines the uterus (endometrium) to become thin. You may have bleeding as the endometrium thins. Endometrial hyperplasia. This condition is caused by excess estrogen hormones and low levels of progesterone hormones. The excess estrogen causes the endometrium to thicken, which can lead to bleeding. In some cases, this can lead to cancer of the uterus. Endometrial cancer. Noncancerous growths (polyps) on the endometrium, the lining of the uterus, or the cervix. Uterine fibroids. These are noncancerous growths in or around the uterus muscle tissue that can cause heavy bleeding. Any type of postmenopausal bleeding, even if it appears to be a typical menstrual period, should be checked by your health care provider. Treatment will depend on the cause of the bleeding. Follow these instructions at home:  Pay attention to any changes in your symptoms. Let your health care provider know about them. Avoid using tampons and douches as told by your health care provider. Change your pads regularly. Get regular pelvic exams, including Pap tests, as told by your health care provider. Take iron supplements as told by your health care provider. Take over-the-counter and prescription medicines only as told by your health care provider. Keep all follow-up visits. This is important. Contact a health care provider if: You have new bleeding from the vagina after  menopause. You have pain in your abdomen. Get help right away if: You have a fever or chills. You have severe pain with bleeding. You are passing blood clots. You have heavy bleeding, need more than 1 pad an hour, and have never experienced this before. You have headaches or feel faint or dizzy. Summary Postmenopausal bleeding is any bleeding that a woman has after she has entered into menopause. Postmenopausal bleeding may have various causes. Treatment will depend on the cause of the bleeding. Any type of postmenopausal bleeding, even if it appears to be a typical menstrual period, should be checked by your health care provider. Be sure to pay attention to any changes in your symptoms and keep all follow-up visits. This information is not intended to replace advice given to you by your health care provider. Make sure you discuss any questions you have with your health care provider. Document Revised: 06/27/2019 Document Reviewed: 06/27/2019 Elsevier Patient Education  [place].

## 2022-01-03 NOTE — Progress Notes (Signed)
Subjective  CC:  Chief Complaint  Patient presents with   Vaginal Bleeding    Pt stated that she has been having some vaginal spotting for the past 46mo. Its does not happe all the time but it has happened 6/8 times in the last couple of weeks     HPI: Kristin BRAZIERis a 71y.o. female who presents to the office today to address the problems listed above in the chief complaint. 71yo with light spotting on and off over 6 months but more frequently recently w/o pain, vaginal d/c, urinary sxs. Describes light streaks of old blood on undergarments. No clear pattern. Never heavy. On chronic eloquis.   Assessment  1. Postmenopausal vaginal bleeding   2. Urethral caruncle   3. Vaginal atrophy      Plan  Postmenopausal bleeding:  pelvic exam sig for vaginal atrophy and urethral caruncle. Likely cause of sxs. Will get TVUS to evaluate endometrium. If normal, then treat with vaginal estrogen cream x 4 -6 weeks. Education given.   Follow up: prn  Visit date not found  Orders Placed This Encounter  Procedures   [place] COMPLETE WITH TRANSVAGINAL   No orders of the defined types were placed in this encounter.     I reviewed the patients updated PMH, FH, and SocHx.    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    Priority: High   OSA (obstructive sleep apnea) 10/20/2017    Priority: Medium     Class: Chronic   Osteopenia after menopause 06/14/2017    Priority: Medium    Osteoarthritis of left hip 05/11/2017    Priority: Medium    GERD (gastroesophageal reflux disease) 01/08/2013    Priority: Medium    OA (osteoarthritis) 01/08/2013    Priority: Medium    History of peptic ulcer disease  - nsaid 01/08/2013    Priority: Medium    Diverticulosis of large intestine without hemorrhage 01/04/2016    Priority: Low   Internal hemorrhoids without complication 133/82/5053   Priority: Low   AR (allergic rhinitis) 01/08/2013    Priority: Low   S/P left THA, AA 02/13/2018    Priority: 2.   Left atrial enlargement 11/16/2020   Atrial flutter ([place]    No outpatient medications have been marked as taking for the 01/03/22 encounter (Office Visit) with ALeamon Arnt MD.    Allergies: Patient is allergic to morphine and related, neomycin, and adhesive [tape]. Family History: Patient family history includes COPD in her brother; Cancer in her maternal grandmother; Diabetes Mellitus II in her maternal uncle; Drug abuse in her brother; Peptic Ulcer in her mother; Rheum arthritis in her mother. Social History:  Patient  reports that she has never smoked. She has been exposed to tobacco smoke. She has never used smokeless tobacco. She reports that she does not drink alcohol and does not use drugs.  Review of Systems: Constitutional: Negative for fever malaise or anorexia Cardiovascular: negative for chest pain Respiratory: negative for SOB or persistent cough Gastrointestinal: negative for abdominal pain  Objective  Vitals: BP (!) 110/58   Pulse 80   Temp 98.1 F (36.7 C)  Ht '5\' 7"'$  (1.702 m)   Wt 208 lb 6.4 oz (94.5 kg)   SpO2 97%   BMI 32.64 kg/m  General: no acute distress , A&Ox3 Pelvic: normal introitus, urethra with erythema and fleshy outgrowth, non tender and soft, no visible polyp Vaginal mucosa is atrophic with light bleeding with speculum exam. Nl appearing cervix. Nontender uterus. No adnexal masses  Commons side effects, risks, benefits, and alternatives for medications and treatment plan prescribed today were discussed, and the patient expressed understanding of the given instructions. Patient is instructed to call or message via MyChart if he/she has any  questions or concerns regarding our treatment plan. No barriers to understanding were identified. We discussed Red Flag symptoms and signs in detail. Patient expressed understanding regarding what to do in case of urgent or emergency type symptoms.  Medication list was reconciled, printed and provided to the patient in AVS. Patient instructions and summary information was reviewed with the patient as documented in the AVS. This note was prepared with assistance of Dragon voice recognition software. Occasional wrong-word or sound-a-like substitutions may have occurred due to the inherent limitations of voice recognition software  This visit occurred during the SARS-CoV-2 public health emergency.  Safety protocols were in place, including screening questions prior to the visit, additional usage of staff PPE, and extensive cleaning of exam room while observing appropriate contact time as indicated for disinfecting solutions.

## 2022-01-06 ENCOUNTER — Ambulatory Visit (HOSPITAL_BASED_OUTPATIENT_CLINIC_OR_DEPARTMENT_OTHER)
Admission: RE | Admit: 2022-01-06 | Discharge: 2022-01-06 | Disposition: A | Discharge status: Home / Self Care | Payer: Medicare PPO | Source: Ambulatory Visit | Attending: Family Medicine | Admitting: Family Medicine

## 2022-01-06 DIAGNOSIS — N939 Abnormal uterine and vaginal bleeding, unspecified: Secondary | ICD-10-CM | POA: Diagnosis not present

## 2022-01-06 DIAGNOSIS — N950 Postmenopausal bleeding: Secondary | ICD-10-CM | POA: Diagnosis not present

## 2022-01-06 DIAGNOSIS — Z780 Asymptomatic menopausal state: Secondary | ICD-10-CM | POA: Diagnosis not present

## 2022-01-11 ENCOUNTER — Encounter: Payer: Self-pay | Admitting: Family Medicine

## 2022-01-11 DIAGNOSIS — D219 Benign neoplasm of connective and other soft tissue, unspecified: Secondary | ICD-10-CM | POA: Insufficient documentation

## 2022-01-27 NOTE — Progress Notes (Signed)
Sent message, via epic in basket, requesting orders in epic from surgeon.  

## 2022-01-28 ENCOUNTER — Encounter: Payer: Self-pay | Admitting: Cardiovascular Disease

## 2022-01-28 ENCOUNTER — Telehealth: Payer: Self-pay | Admitting: Family Medicine

## 2022-01-28 NOTE — Progress Notes (Signed)
PERIOPERATIVE PRESCRIPTION FOR IMPLANTED CARDIAC DEVICE PROGRAMMING  Patient Information: Name:  Kristin [name]  DOB:  [birth year]  MRN:  [id number]    Planned Procedure:  Right hip revision  Surgeon:  Dr. Paralee Cancel  Date of Procedure: 02/10/22  Cautery will be used.  Position during surgery:    Please send documentation back to:  [facility] (Fax # [phone])   Device Information:  Clinic EP Physician:  Dr. Myles Gip  Device Type:  Pacemaker Manufacturer and Phone #:  Medtronic: [id number] Pacemaker Dependent?:  No. Date of Last Device Check:  12/10/21 Normal Device Function?:  Yes.    Electrophysiologist's Recommendations:  Have magnet available. Provide continuous ECG monitoring when magnet is used or reprogramming is to be performed.  Procedure should not interfere with device function.  No device programming or magnet placement needed.  Per Device Clinic Standing Orders, Wanda Plump, RN  1:15 PM 01/28/2022

## 2022-01-28 NOTE — Patient Instructions (Signed)
DUE TO COVID-19 ONLY TWO VISITORS  (aged 71 and older)  ARE ALLOWED TO COME WITH YOU AND STAY IN THE WAITING ROOM ONLY DURING PRE OP AND PROCEDURE.   **NO VISITORS ARE ALLOWED IN THE SHORT STAY AREA OR RECOVERY ROOM!!**  IF YOU WILL BE ADMITTED INTO THE HOSPITAL YOU ARE ALLOWED ONLY FOUR SUPPORT PEOPLE DURING VISITATION HOURS ONLY (7 AM -8PM)   The support person(s) must pass our screening, gel in and out, and wear a mask at all times, including in the patient's room. Patients must also wear a mask when staff or their support person are in the room. Visitors GUEST BADGE MUST BE WORN VISIBLY  One adult visitor may remain with you overnight and MUST be in the room by 8 P.M.     Your procedure is scheduled on: 02/10/22   Report to [facility] Main Entrance    Report to admitting at   5:15 AM   Call this number if you have problems the morning of surgery [phone]   Do not eat food :After Midnight.   After Midnight you may have the following liquids until _4:30 _____ AM/  DAY OF SURGERY  Water Black Coffee (sugar ok, NO MILK/CREAM OR CREAMERS)  Tea (sugar ok, NO MILK/CREAM OR CREAMERS) regular and decaf                             Plain Jell-O (NO RED)                                           Fruit ices (not with fruit pulp, NO RED)                                     Popsicles (NO RED)                                                                  Juice: apple, WHITE grape, WHITE cranberry Sports drinks like Gatorade (NO RED)                  The day of surgery:  Drink ONE (1) Pre-Surgery Clear Ensure  at 4:15 AM the morning of surgery. Drink in one sitting. Do not sip.  This drink was given to you during your hospital  pre-op appointment visit. Nothing else to drink after completing the  Pre-Surgery Clear Ensure at 4:30 AM.          If you have questions, please contact your surgeon's office.     Oral Hygiene is also important to reduce your risk of infection.                                     Remember - BRUSH YOUR TEETH THE MORNING OF SURGERY WITH YOUR REGULAR TOOTHPASTE  DENTURES WILL BE REMOVED PRIOR TO SURGERY PLEASE DO NOT APPLY "Poly grip" OR ADHESIVES!!!   Do NOT smoke after Midnight  Take these medicines the morning of surgery with A SIP OF WATER: Dofetilide-Tikosyn                                                                                                                           Diltiazem-Cardizem                                                                                                                           Levothyroxine                                                                                                                           Omeprazole   Before surgery.Stop taking _Eliquis__hold for 72 hours________on _last dose 1/14/24_________as instructed by _____________.   Contact your Surgeon/Cardiologist for instructions on Anticoagulant Therapy prior to surgery.   Bring CPAP mask and tubing day of surgery.                              You may not have any metal on your body including hair pins, jewelry, and body piercing             Do not wear make-up, lotions, powders, perfumes/cologne, or deodorant  Do not wear nail polish including gel and S&S, artificial/acrylic nails, or any other type of covering on natural nails including finger and toenails. If you have artificial nails, gel coating, etc. that needs to be removed by a nail salon please have this removed prior to surgery or surgery may need to be canceled/ delayed if the surgeon/ anesthesia feels like they are unable to be safely monitored.   Do not shave  48 hours prior to surgery.     Do not bring valuables to the hospital. [place].  Contacts, glasses, or bridgework may not be worn into surgery.   Bring small overnight bag day of surgery.   DO NOT [place]. PHARMACY WILL DISPENSE MEDICATIONS LISTED ON YOUR MEDICATION LIST TO YOU DURING YOUR ADMISSION [place]!       Special Instructions: Bring a copy of your healthcare power of attorney and living will documents  the day of surgery if you haven't scanned them before.              Please read over the following fact sheets you were given: IF YOU HAVE QUESTIONS ABOUT YOUR PRE-OP INSTRUCTIONS PLEASE CALL [phone]    [facility] - Preparing for Surgery Before surgery, you can play an important role.  Because skin is not sterile, your skin needs to be as free of germs as possible.  You can reduce the number of germs on your skin by washing with CHG (chlorahexidine gluconate) soap before surgery.  CHG is an antiseptic cleaner which kills germs and bonds with the skin to continue killing germs even after washing. Please DO NOT use if you have an allergy to CHG or antibacterial soaps.  If your skin becomes reddened/irritated stop using the CHG and inform your nurse when you arrive at Short Stay. Do not shave (including legs and underarms) for at least 48 hours prior to the first CHG shower.   Please follow these instructions carefully:  1.  Shower with CHG Soap the night before surgery and the  morning of Surgery.  2.  If you choose to wash your hair, wash your hair first as usual with your  normal  shampoo.  3.  After you shampoo, rinse your hair and body thoroughly to remove the  shampoo.                            4.  Use CHG as you would any other liquid soap.  You can apply chg directly  to the skin and wash                       Gently with a scrungie or clean washcloth.  5.  Apply the CHG Soap to your body ONLY FROM THE NECK DOWN.   Do not use on face/ open                           Wound or open sores. Avoid contact with eyes, ears mouth and genitals (private parts).                       Wash face,  Genitals (private parts) with your normal soap.             6.  Wash  thoroughly, paying special attention to the area where your surgery  will be performed.  7.  Thoroughly rinse your body with warm water from the neck down.  8.  DO NOT shower/wash with your normal soap after using and rinsing off  the CHG Soap.                9.  Pat yourself dry with a clean towel.            10.  Wear clean pajamas.            11.  Place clean sheets on your bed  the night of your first shower and do not  sleep with pets. Day of Surgery : Do not apply any lotions/deodorants the morning of surgery.  Please wear clean clothes to the hospital/surgery center.  FAILURE TO FOLLOW THESE INSTRUCTIONS MAY RESULT IN THE CANCELLATION OF YOUR SURGERY _  ________________________________________________________________________  Incentive Spirometer  An incentive spirometer is a tool that can help keep your lungs clear and active. This tool measures how well you are filling your lungs with each breath. Taking long deep breaths may help reverse or decrease the chance of developing breathing (pulmonary) problems (especially infection) following: A long period of time when you are unable to move or be active. BEFORE THE PROCEDURE  If the spirometer includes an indicator to show your best effort, your nurse or respiratory therapist will set it to a desired goal. If possible, sit up straight or lean slightly forward. Try not to slouch. Hold the incentive spirometer in an upright position. INSTRUCTIONS FOR USE  Sit on the edge of your bed if possible, or sit up as far as you can in bed or on a chair. Hold the incentive spirometer in an upright position. Breathe out normally. Place the mouthpiece in your mouth and seal your lips tightly around it. Breathe in slowly and as deeply as possible, raising the piston or the ball toward the top of the column. Hold your breath for 3-5 seconds or for as long as possible. Allow the piston or ball to fall to the bottom of the column. Remove the  mouthpiece from your mouth and breathe out normally. Rest for a few seconds and repeat Steps 1 through 7 at least 10 times every 1-2 hours when you are awake. Take your time and take a few normal breaths between deep breaths. The spirometer may include an indicator to show your best effort. Use the indicator as a goal to work toward during each repetition. After each set of 10 deep breaths, practice coughing to be sure your lungs are clear. If you have an incision (the cut made at the time of surgery), support your incision when coughing by placing a pillow or rolled up towels firmly against it. Once you are able to get out of bed, walk around indoors and cough well. You may stop using the incentive spirometer when instructed by your caregiver.  RISKS AND COMPLICATIONS Take your time so you do not get dizzy or light-headed. If you are in pain, you may need to take or ask for pain medication before doing incentive spirometry. It is harder to take a deep breath if you are having pain. AFTER USE Rest and breathe slowly and easily. It can be helpful to keep track of a log of your progress. Your caregiver can provide you with a simple table to help with this. If you are using the spirometer at home, follow these instructions: [place] IF:  You are having difficultly using the spirometer. You have trouble using the spirometer as often as instructed. Your pain medication is not giving enough relief while using the spirometer. You develop fever of 100.5 F (38.1 C) or higher. SEEK IMMEDIATE MEDICAL CARE IF:  You cough up bloody sputum that had not been present before. You develop fever of 102 F (38.9 C) or greater. You develop worsening pain at or near the incision site. MAKE SURE YOU:  Understand these instructions. Will watch your condition. Will get help right away if you are not doing well or get worse. Document  Released: 05/23/2006 Document Revised: 04/04/2011 Document Reviewed:  07/24/2006 [facility] Patient Information 2014 [place], [place].   ________________________________________________________________________

## 2022-01-28 NOTE — Telephone Encounter (Signed)
Patient states: - She is set to have hip surgery by Dr. Alvan Dame on 02/10/22 - Dr. Aurea Graff office stated they fax surgical clearance form to PCP office but hadn't gotten a response back  - She is not sure when form was initially faxed   Patient requests:  - Surgical clearance form be completed and faxed back so she can continue with her surgery on 02/10/22

## 2022-01-31 NOTE — Telephone Encounter (Signed)
Spoke with pt to let her know that I have not seen any surgery clearance forms so she may need to F/U with that office to make sure that they faxed over the papers

## 2022-02-03 ENCOUNTER — Encounter (HOSPITAL_COMMUNITY)
Admission: RE | Admit: 2022-02-03 | Discharge: 2022-02-03 | Disposition: A | Discharge status: Home / Self Care | Payer: Medicare PPO | Source: Ambulatory Visit | Attending: Family Medicine | Admitting: Family Medicine

## 2022-02-08 ENCOUNTER — Encounter (HOSPITAL_COMMUNITY)
Admission: RE | Admit: 2022-02-08 | Discharge: 2022-02-08 | Disposition: A | Discharge status: Home / Self Care | Payer: Medicare PPO | Source: Ambulatory Visit | Attending: Orthopedic Surgery | Admitting: Orthopedic Surgery

## 2022-02-08 ENCOUNTER — Other Ambulatory Visit: Payer: Self-pay

## 2022-02-08 ENCOUNTER — Encounter (HOSPITAL_COMMUNITY): Payer: Self-pay

## 2022-02-08 DIAGNOSIS — K449 Diaphragmatic hernia without obstruction or gangrene: Secondary | ICD-10-CM | POA: Diagnosis not present

## 2022-02-08 DIAGNOSIS — Z7901 Long term (current) use of anticoagulants: Secondary | ICD-10-CM | POA: Diagnosis not present

## 2022-02-08 DIAGNOSIS — Z96641 Presence of right artificial hip joint: Secondary | ICD-10-CM | POA: Diagnosis not present

## 2022-02-08 DIAGNOSIS — E890 Postprocedural hypothyroidism: Secondary | ICD-10-CM | POA: Diagnosis present

## 2022-02-08 DIAGNOSIS — M8580 Other specified disorders of bone density and structure, unspecified site: Secondary | ICD-10-CM | POA: Diagnosis present

## 2022-02-08 DIAGNOSIS — Z79899 Other long term (current) drug therapy: Secondary | ICD-10-CM | POA: Diagnosis not present

## 2022-02-08 DIAGNOSIS — I480 Paroxysmal atrial fibrillation: Secondary | ICD-10-CM | POA: Diagnosis present

## 2022-02-08 DIAGNOSIS — Y792 Prosthetic and other implants, materials and accessory orthopedic devices associated with adverse incidents: Secondary | ICD-10-CM | POA: Diagnosis present

## 2022-02-08 DIAGNOSIS — E039 Hypothyroidism, unspecified: Secondary | ICD-10-CM | POA: Diagnosis not present

## 2022-02-08 DIAGNOSIS — Z01812 Encounter for preprocedural laboratory examination: Secondary | ICD-10-CM | POA: Diagnosis not present

## 2022-02-08 DIAGNOSIS — M1990 Unspecified osteoarthritis, unspecified site: Secondary | ICD-10-CM | POA: Insufficient documentation

## 2022-02-08 DIAGNOSIS — T84090A Other mechanical complication of internal right hip prosthesis, initial encounter: Secondary | ICD-10-CM | POA: Diagnosis present

## 2022-02-08 DIAGNOSIS — Z8261 Family history of arthritis: Secondary | ICD-10-CM | POA: Diagnosis not present

## 2022-02-08 DIAGNOSIS — Z825 Family history of asthma and other chronic lower respiratory diseases: Secondary | ICD-10-CM | POA: Diagnosis not present

## 2022-02-08 DIAGNOSIS — Z79890 Hormone replacement therapy (postmenopausal): Secondary | ICD-10-CM | POA: Diagnosis not present

## 2022-02-08 DIAGNOSIS — M65851 Other synovitis and tenosynovitis, right thigh: Secondary | ICD-10-CM | POA: Diagnosis not present

## 2022-02-08 DIAGNOSIS — I442 Atrioventricular block, complete: Secondary | ICD-10-CM | POA: Diagnosis present

## 2022-02-08 DIAGNOSIS — M67851 Other specified disorders of synovium, right hip: Secondary | ICD-10-CM | POA: Diagnosis not present

## 2022-02-08 DIAGNOSIS — G4733 Obstructive sleep apnea (adult) (pediatric): Secondary | ICD-10-CM | POA: Diagnosis present

## 2022-02-08 DIAGNOSIS — Z885 Allergy status to narcotic agent status: Secondary | ICD-10-CM | POA: Diagnosis not present

## 2022-02-08 DIAGNOSIS — T84010A Broken internal right hip prosthesis, initial encounter: Secondary | ICD-10-CM | POA: Diagnosis not present

## 2022-02-08 DIAGNOSIS — K219 Gastro-esophageal reflux disease without esophagitis: Secondary | ICD-10-CM | POA: Diagnosis present

## 2022-02-08 DIAGNOSIS — G4730 Sleep apnea, unspecified: Secondary | ICD-10-CM | POA: Diagnosis not present

## 2022-02-08 DIAGNOSIS — Z813 Family history of other psychoactive substance abuse and dependence: Secondary | ICD-10-CM | POA: Diagnosis not present

## 2022-02-08 DIAGNOSIS — Z8049 Family history of malignant neoplasm of other genital organs: Secondary | ICD-10-CM | POA: Diagnosis not present

## 2022-02-08 DIAGNOSIS — Z8711 Personal history of peptic ulcer disease: Secondary | ICD-10-CM | POA: Diagnosis not present

## 2022-02-08 DIAGNOSIS — Z471 Aftercare following joint replacement surgery: Secondary | ICD-10-CM | POA: Diagnosis not present

## 2022-02-08 DIAGNOSIS — Z833 Family history of diabetes mellitus: Secondary | ICD-10-CM | POA: Diagnosis not present

## 2022-02-08 DIAGNOSIS — Z950 Presence of cardiac pacemaker: Secondary | ICD-10-CM | POA: Diagnosis not present

## 2022-02-08 DIAGNOSIS — Z01818 Encounter for other preprocedural examination: Secondary | ICD-10-CM

## 2022-02-08 DIAGNOSIS — Z888 Allergy status to other drugs, medicaments and biological substances status: Secondary | ICD-10-CM | POA: Diagnosis not present

## 2022-02-08 LAB — CBC
HCT: 40 % (ref 36.0–46.0)
Hemoglobin: 12.6 g/dL (ref 12.0–15.0)
MCH: 30.3 pg (ref 26.0–34.0)
MCHC: 31.5 g/dL (ref 30.0–36.0)
MCV: 96.2 fL (ref 80.0–100.0)
Platelets: 269 10*3/uL (ref 150–400)
RBC: 4.16 MIL/uL (ref 3.87–5.11)
RDW: 13.8 % (ref 11.5–15.5)
WBC: 5.7 10*3/uL (ref 4.0–10.5)
nRBC: 0 % (ref 0.0–0.2)

## 2022-02-08 LAB — BASIC METABOLIC PANEL
Anion gap: 11 (ref 5–15)
BUN: 19 mg/dL (ref 8–23)
CO2: 24 mmol/L (ref 22–32)
Calcium: 9.6 mg/dL (ref 8.9–10.3)
Chloride: 102 mmol/L (ref 98–111)
Creatinine, Ser: 0.8 mg/dL (ref 0.44–1.00)
GFR, Estimated: >60 mL/min (ref >60)
Glucose, Bld: 96 mg/dL (ref 70–99)
Potassium: 4.4 mmol/L (ref 3.5–5.1)
Sodium: 137 mmol/L (ref 135–145)

## 2022-02-08 LAB — SURGICAL PCR SCREEN
MRSA, PCR: NEGATIVE
Staphylococcus aureus: NEGATIVE

## 2022-02-08 NOTE — Progress Notes (Addendum)
COVID Vaccine Completed:  Yes  Date of COVID positive in last 90 days:  No  PCP - Billey Chang, MD Cardiologist - Cristopher Peru, MD  Chest x-ray -  EKG - 09-30-21 Epic Stress Test - 10+ years ago ECHO - 11-18-19 Epic Cardiac Cath - N/A Pacemaker/ICD device last checked: 12-10-21 Epic. Device orders in Epic Spinal Cord Stimulator: Cardiac CT - 01-04-19 Epic  Bowel Prep -  N/A  Sleep Study - Yes, +sleep apnea CPAP -  No  Fasting Blood Sugar - N/A Checks Blood Sugar _____ times a day  Last dose of GLP1 agonist-  N/A GLP1 instructions:  N/A   Last dose of SGLT-2 inhibitors-  N/A SGLT-2 instructions: N/A  Blood Thinner Instructions:  Eliquis.  Last dose 02-06-22 at 9:00 PM Aspirin Instructions: Last Dose:  Activity level:  Can go up a flight of stairs and perform activities of daily living without stopping and without symptoms of chest pain or shortness of breath.  Anesthesia review:  Complete heart block, Afib with hx of ablation and cardioversion,  Mild OSA (no CPAP)  Patient denies shortness of breath, fever, cough and chest pain at PAT appointment  Patient verbalized understanding of instructions that were given to them at the PAT appointment. Patient was also instructed that they will need to review over the PAT instructions again at home before surgery.

## 2022-02-08 NOTE — Patient Instructions (Addendum)
SURGICAL WAITING ROOM VISITATION Patients having surgery or a procedure may have no more than 2 support people in the waiting area - these visitors may rotate.    If the patient needs to stay at the hospital during part of their recovery, the visitor guidelines for inpatient rooms apply. Pre-op nurse will coordinate an appropriate time for 1 support person to accompany patient in pre-op.  This support person may not rotate.    Please refer to the [facility] website for the visitor guidelines for Inpatients (after your surgery is over and you are in a regular room).   Due to an increase in RSV and influenza rates and associated hospitalizations, children ages 78 and under may not visit patients in [place].     Your procedure is scheduled on: 02-10-22   Report to [facility] Main Entrance    Report to admitting at 5:15 AM   Call this number if you have problems the morning of surgery [phone]   Do not eat food :After Midnight.   After Midnight you may have the following liquids until 4:30 AM DAY OF SURGERY  Water Non-Citrus Juices (without pulp, NO RED) Carbonated Beverages Black Coffee (NO MILK/CREAM OR CREAMERS, sugar ok)  Clear Tea (NO MILK/CREAM OR CREAMERS, sugar ok) regular and decaf                             Plain Jell-O (NO RED)                                           Fruit ices (not with fruit pulp, NO RED)                                     Popsicles (NO RED)                                                               Sports drinks like Gatorade (NO RED)                   The day of surgery:  Drink ONE (1) Pre-Surgery Clear Ensure at 4:30 AM the morning of surgery. Drink in one sitting. Do not sip.  This drink was given to you during your hospital  pre-op appointment visit. Nothing else to drink after completing the Pre-Surgery Clear Ensure.          If you have questions, please contact your surgeon's office.   FOLLOW  ANY  ADDITIONAL PRE OP INSTRUCTIONS YOU RECEIVED FROM YOUR SURGEON'S OFFICE!!!     Oral Hygiene is also important to reduce your risk of infection.                                    Remember - BRUSH YOUR TEETH THE MORNING OF SURGERY WITH YOUR REGULAR TOOTHPASTE   Do NOT smoke after Midnight   Take these medicines the morning of surgery with A SIP OF WATER:  Dofetilide  Diltiazem  Levothyroxine  Omeprazole  Tramadol if needed                              You may not have any metal on your body including hair pins, jewelry, and body piercing             Do not wear make-up, lotions, powders, perfumes or deodorant  Do not wear nail polish including gel and S&S, artificial/acrylic nails, or any other type of covering on natural nails including finger and toenails. If you have artificial nails, gel coating, etc. that needs to be removed by a nail salon please have this removed prior to surgery or surgery may need to be canceled/ delayed if the surgeon/ anesthesia feels like they are unable to be safely monitored.   Do not shave  48 hours prior to surgery.    Do not bring valuables to the hospital. [place].   Contacts, dentures or bridgework may not be worn into surgery.   Bring small overnight bag day of surgery.   DO NOT [place]. PHARMACY WILL DISPENSE MEDICATIONS LISTED ON YOUR MEDICATION LIST TO YOU DURING YOUR ADMISSION [place]!    Please read over the following fact sheets you were given: IF Kristin [name]  If you received a COVID test during your pre-op visit  it is requested that you wear a mask when out in public, stay away from anyone that may not be feeling well and notify your surgeon if you develop symptoms. If you test positive for Covid or have been in contact with anyone that has tested positive in the last 10 days please notify  you surgeon.  [place] - Preparing for Surgery Before surgery, you can play an important role.  Because skin is not sterile, your skin needs to be as free of germs as possible.  You can reduce the number of germs on your skin by washing with CHG (chlorahexidine gluconate) soap before surgery.  CHG is an antiseptic cleaner which kills germs and bonds with the skin to continue killing germs even after washing. Please DO NOT use if you have an allergy to CHG or antibacterial soaps.  If your skin becomes reddened/irritated stop using the CHG and inform your nurse when you arrive at Short Stay. Do not shave (including legs and underarms) for at least 48 hours prior to the first CHG shower.  You may shave your face/neck.  Please follow these instructions carefully:  1.  Shower with CHG Soap the night before surgery and the  morning of surgery.  2.  If you choose to wash your hair, wash your hair first as usual with your normal  shampoo.  3.  After you shampoo, rinse your hair and body thoroughly to remove the shampoo.                             4.  Use CHG as you would any other liquid soap.  You can apply chg directly to the skin and wash.  Gently with a scrungie or clean washcloth.  5.  Apply the CHG Soap to your body ONLY FROM THE NECK DOWN.   Do   not use on face/ open  Wound or open sores. Avoid contact with eyes, ears mouth and   genitals (private parts).                       Wash face,  Genitals (private parts) with your normal soap.             6.  Wash thoroughly, paying special attention to the area where your    surgery  will be performed.  7.  Thoroughly rinse your body with warm water from the neck down.  8.  DO NOT shower/wash with your normal soap after using and rinsing off the CHG Soap.                9.  Pat yourself dry with a clean towel.            10.  Wear clean pajamas.            11.  Place clean sheets on your bed the night of your first shower  and do not  sleep with pets. Day of Surgery : Do not apply any lotions/deodorants the morning of surgery.  Please wear clean clothes to the hospital/surgery center.  FAILURE TO FOLLOW THESE INSTRUCTIONS MAY RESULT IN THE CANCELLATION OF YOUR SURGERY  PATIENT SIGNATURE_________________________________  NURSE SIGNATURE__________________________________  ________________________________________________________________________    Kristin [name]  An incentive spirometer is a tool that can help keep your lungs clear and active. This tool measures how well you are filling your lungs with each breath. Taking long deep breaths may help reverse or decrease the chance of developing breathing (pulmonary) problems (especially infection) following: A long period of time when you are unable to move or be active. BEFORE THE PROCEDURE  If the spirometer includes an indicator to show your best effort, your nurse or respiratory therapist will set it to a desired goal. If possible, sit up straight or lean slightly forward. Try not to slouch. Hold the incentive spirometer in an upright position. INSTRUCTIONS FOR USE  Sit on the edge of your bed if possible, or sit up as far as you can in bed or on a chair. Hold the incentive spirometer in an upright position. Breathe out normally. Place the mouthpiece in your mouth and seal your lips tightly around it. Breathe in slowly and as deeply as possible, raising the piston or the ball toward the top of the column. Hold your breath for 3-5 seconds or for as long as possible. Allow the piston or ball to fall to the bottom of the column. Remove the mouthpiece from your mouth and breathe out normally. Rest for a few seconds and repeat Steps 1 through 7 at least 10 times every 1-2 hours when you are awake. Take your time and take a few normal breaths between deep breaths. The spirometer may include an indicator to show your best effort. Use the indicator as a goal  to work toward during each repetition. After each set of 10 deep breaths, practice coughing to be sure your lungs are clear. If you have an incision (the cut made at the time of surgery), support your incision when coughing by placing a pillow or rolled up towels firmly against it. Once you are able to get out of bed, walk around indoors and cough well. You may stop using the incentive spirometer when instructed by your caregiver.  RISKS AND COMPLICATIONS Take your time so you do not get dizzy or light-headed. If you are in pain,  you may need to take or ask for pain medication before doing incentive spirometry. It is harder to take a deep breath if you are having pain. AFTER USE Rest and breathe slowly and easily. It can be helpful to keep track of a log of your progress. Your caregiver can provide you with a simple table to help with this. If you are using the spirometer at home, follow these instructions: [place] IF:  You are having difficultly using the spirometer. You have trouble using the spirometer as often as instructed. Your pain medication is not giving enough relief while using the spirometer. You develop fever of 100.5 F (38.1 C) or higher. SEEK IMMEDIATE MEDICAL CARE IF:  You cough up bloody sputum that had not been present before. You develop fever of 102 F (38.9 C) or greater. You develop worsening pain at or near the incision site. MAKE SURE YOU:  Understand these instructions. Will watch your condition. Will get help right away if you are not doing well or get worse. Document Released: 05/23/2006 Document Revised: 04/04/2011 Document Reviewed: 07/24/2006 ExitCare Patient Information 2014 ExitCare, [place].   ________________________________________________________________________ WHAT IS A BLOOD TRANSFUSION? Blood Transfusion Information  A transfusion is the replacement of blood or some of its parts. Blood is made up of multiple cells which provide  different functions. Red blood cells carry oxygen and are used for blood loss replacement. White blood cells fight against infection. Platelets control bleeding. Plasma helps clot blood. Other blood products are available for specialized needs, such as hemophilia or other clotting disorders. BEFORE THE TRANSFUSION  Who gives blood for transfusions?  Healthy volunteers who are fully evaluated to make sure their blood is safe. This is blood bank blood. Transfusion therapy is the safest it has ever been in the practice of medicine. Before blood is taken from a donor, a complete history is taken to make sure that person has no history of diseases nor engages in risky social behavior (examples are intravenous drug use or sexual activity with multiple partners). The donor's travel history is screened to minimize risk of transmitting infections, such as malaria. The donated blood is tested for signs of infectious diseases, such as HIV and hepatitis. The blood is then tested to be sure it is compatible with you in order to minimize the chance of a transfusion reaction. If you or a relative donates blood, this is often done in anticipation of surgery and is not appropriate for emergency situations. It takes many days to process the donated blood. RISKS AND COMPLICATIONS Although transfusion therapy is very safe and saves many lives, the main dangers of transfusion include:  Getting an infectious disease. Developing a transfusion reaction. This is an allergic reaction to something in the blood you were given. Every precaution is taken to prevent this. The decision to have a blood transfusion has been considered carefully by your caregiver before blood is given. Blood is not given unless the benefits outweigh the risks. AFTER THE TRANSFUSION Right after receiving a blood transfusion, you will usually feel much better and more energetic. This is especially true if your red blood cells have gotten low (anemic).  The transfusion raises the level of the red blood cells which carry oxygen, and this usually causes an energy increase. The nurse administering the transfusion will monitor you carefully for complications. HOME CARE INSTRUCTIONS  No special instructions are needed after a transfusion. You may find your energy is better. Speak with your caregiver about any limitations on  activity for underlying diseases you may have. SEEK MEDICAL CARE IF:  Your condition is not improving after your transfusion. You develop redness or irritation at the intravenous (IV) site. SEEK IMMEDIATE MEDICAL CARE IF:  Any of the following symptoms occur over the next 12 hours: Shaking chills. You have a temperature by mouth above 102 F (38.9 C), not controlled by medicine. Chest, back, or muscle pain. People around you feel you are not acting correctly or are confused. Shortness of breath or difficulty breathing. Dizziness and fainting. You get a rash or develop hives. You have a decrease in urine output. Your urine turns a dark color or changes to pink, red, or brown. Any of the following symptoms occur over the next 10 days: You have a temperature by mouth above 102 F (38.9 C), not controlled by medicine. Shortness of breath. Weakness after normal activity. The white part of the eye turns yellow (jaundice). You have a decrease in the amount of urine or are urinating less often. Your urine turns a dark color or changes to pink, red, or brown. Document Released: 01/08/2000 Document Revised: 04/04/2011 Document Reviewed: 08/27/2007 [facility] Patient Information 2014 [place], [place].  _______________________________________________________________________

## 2022-02-09 NOTE — H&P (Addendum)
TOTAL HIP REVISION ADMISSION H&P  Patient is admitted for right revision total hip arthroplasty.  Subjective:  Chief Complaint: right hip pain, metallosis  HPI: Kristin [name], 72 y.o. female. She has a history of right total hip arthroplasty in July 2008 performed with DePuy ASR metal-on-metal components. She began having pain in the hip over the past year without injury. 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   Patient Active Problem List   Diagnosis Date Noted   Fibroid 01/11/2022   Left atrial enlargement 11/16/2020   Atrial flutter ([place])    S/P left THA, AA 02/13/2018   OSA (obstructive sleep apnea) 10/20/2017   Osteopenia after menopause 06/14/2017   Osteoarthritis of left hip 05/11/2017   Diverticulosis of large intestine without hemorrhage 01/04/2016   Internal hemorrhoids without complication 12/75/1700   Complete heart block ([place]) 10/06/2015   Long term current use of anticoagulant 08/05/2015   Pacemaker 10/14/2014   Paroxysmal atrial fibrillation ([place]) 10/14/2014   AR (allergic rhinitis) 01/08/2013   GERD (gastroesophageal reflux disease) 01/08/2013   Hypothyroidism, 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   Pacemaker    CHB   Paroxysmal A-fib (HCC)    PUD (peptic ulcer disease)    Sleep apnea    Thyroid nodule    Transient complete heart block 1996    Past Surgical  History:  Procedure Laterality Date   ATRIAL FIBRILLATION ABLATION N/A 01/08/2019   Procedure: ATRIAL FIBRILLATION ABLATION;  Surgeon: [name], MD;  Location: [facility] CV LAB;  Service: Cardiovascular;  Laterality: N/A;   ATRIAL FIBRILLATION ABLATION N/A 04/02/2019   Procedure: ATRIAL FIBRILLATION ABLATION;  Surgeon: [name], MD;  Location: [place] CV LAB;  Service: Cardiovascular;  Laterality: N/A;   CARDIOVERSION N/A 01/28/2019   Procedure: CARDIOVERSION;  Surgeon: Donato Heinz, MD;  Location: [facility] ENDOSCOPY;  Service: Endoscopy;  Laterality: N/A;   CARDIOVERSION N/A 02/27/2019   Procedure: CARDIOVERSION;  Surgeon: Donato Heinz, MD;  Location: [facility] ENDOSCOPY;  Service: Endoscopy;  Laterality: N/A;   HIP ARTHROPLASTY Right 2008   PACEMAKER GENERATOR CHANGE  03/01/2007   performed by Dr Rosita Fire at [facility] ([place])   PACEMAKER INSERTION  09/08/1994   performed in [facility] for transient complete heart block   PPM GENERATOR CHANGEOUT N/A 06/04/2020   Procedure: [place];  Surgeon: [name], MD;  Location: [place] CV LAB;  Service: Cardiovascular;  Laterality: N/A;   THYROIDECTOMY, PARTIAL  1986   BENIGN NODULES   TOTAL HIP ARTHROPLASTY Left 02/13/2018   Procedure: TOTAL HIP ARTHROPLASTY ANTERIOR APPROACH;  Surgeon: Paralee Cancel, MD;  Location: [facility] ORS;  Service: Orthopedics;  Laterality: Left;  70 mins   TUBAL LIGATION  1987    No current facility-administered medications for this encounter.   Current Outpatient Medications  Medication Sig Dispense Refill Last Dose   acetaminophen (TYLENOL) 500 MG tablet Take 1,000 mg by mouth every 6 (six) hours as needed (for headaches or mild pain).      amoxicillin (AMOXIL) 500 MG capsule Take by mouth as directed. Take 4 caps by mouth 1 hour prior to dental procedures      apixaban (ELIQUIS) 5 MG TABS tablet TAKE 1 TABLET TWICE DAILY 180 tablet 1    Calcium Carbonate (CALCIUM 500 PO)  Take 500 mg by mouth daily.      Cholecalciferol (VITAMIN D3) 25 MCG (1000 UT) CAPS Take 1,000 Units by mouth daily.      Cyanocobalamin (B12 LIQUID HEALTH BOOSTER PO) Take 1,000 mcg by mouth every Friday.      diltiazem (CARDIZEM CD) 120 MG 24 hr capsule TAKE ONE CAPSULE TWICE DAILY. MAY TAKE EXTRA CAPSULE DAILY FOR BREAKTHROUGH AFIB. 225 capsule 3    dofetilide (TIKOSYN) 500 MCG capsule TAKE ONE CAPSULE TWICE DAILY 180 capsule 3    ferrous sulfate 325 (65 FE) MG EC tablet Take 325 mg by mouth daily.      levothyroxine (SYNTHROID) 112 MCG tablet TAKE ONE TABLET BY MOUTH DAILY 90 tablet 3    Magnesium 250 MG TABS Take 250 mg by mouth every evening.       omeprazole (PRILOSEC) 20 MG capsule Take 1 capsule (20 mg total) by mouth daily. 90 capsule 3    Potassium 99 MG TABS Take 99 mg by mouth daily.       sodium chloride (OCEAN) 0.65 % SOLN nasal spray Place 1 spray into both nostrils 2 (two) times daily.      traMADol (ULTRAM) 50 MG tablet Take 1 tablet (50 mg total) by mouth every 6 (six) hours as needed for moderate pain. 30 tablet 2    vitamin C (ASCORBIC ACID) 500 MG tablet Take 500 mg by mouth daily.      erythromycin ophthalmic ointment Place 1 Application into the right eye 3 (three) times daily. (Patient not taking: Reported on 01/03/2022) 3.5 g 0 Not Taking   Allergies  Allergen Reactions   Morphine And Related Hives and Itching   Neomycin Itching   Adhesive [Tape] Rash and Other (See Comments)    "Blistering rash"    Social History   Tobacco Use   Smoking status: Never    Passive exposure: Past   Smokeless tobacco: Never  Substance Use Topics   Alcohol use: Never    Alcohol/week: 1.0 standard drink of alcohol    Types: 1 Glasses of wine per week    Family History  Problem Relation Age of Onset   Diabetes Mellitus II Maternal Uncle    Cancer Maternal Grandmother        ENDOMETRIAL   Rheum arthritis Mother    Peptic Ulcer Mother    COPD Brother    Drug abuse Brother     Breast cancer Neg Hx       Review of Systems  Constitutional:  Negative for chills and fever.  Respiratory:  Negative for cough and shortness of breath.   Cardiovascular:  Negative for chest pain.  Gastrointestinal:  Negative for nausea and vomiting.  Musculoskeletal:  Positive for arthralgias.     Objective:  Physical Exam Well nourished and well developed. General: Alert and oriented x3, cooperative and pleasant, no acute distress. Head: normocephalic, atraumatic, neck supple. Eyes: EOMI.  Musculoskeletal: Right hip exam: Exam findings are unchanged. There is no significant fullness noted on  her hip. She does have some tenderness around hip girdle that she notes. She has active hip flexion and abduction with some noted weakness.   Calves soft and nontender. Motor function intact in LE. Strength 5/5 LE bilaterally. Neuro: Distal pulses 2+. Sensation to light touch intact in LE.    Vital signs in last 24 hours:     Labs:   Estimated body mass index is 31.86 kg/m as calculated from the following:   Height as of 02/08/22: '5\' 7"'$  (1.702 m).   Weight as of 02/08/22: 92.3 kg.  Imaging Review: Imaging: Today I reviewed her plain radiographs from her last office visit. The AP pelvis and lateral of the right hip did not indicate any significant concern for loosening of her acetabular shell nor any significant osteolytic changes within her hemipelvis or proximal femur. CT scan of her right hip was performed on 09/30/2021. This was performed because she was unable to do an MRI. The CT scan did not reveal any evidence of significant osteolysis and in particular no periarticular fluid collection.  Laboratory studies: Her serum cobalt level is now 16.3 which represents an increase from 12 in the past. Her serum chromium levels 5.3.     Assessment/Plan:  Failed right total hip arthroplasty   The patient history, physical examination, clinical judgement of the provider and  imaging studies are consistent with failed total hip of the right hip(s), previous total hip arthroplasty. Revision total hip arthroplasty is deemed medically necessary. The treatment options including medical management, injection therapy, arthroscopy and arthroplasty were discussed at length. The risks and benefits of total hip arthroplasty were presented and reviewed. The risks due to aseptic loosening, infection, stiffness, dislocation/subluxation,  thromboembolic complications and other imponderables were discussed.  The patient acknowledged the explanation, agreed to proceed with the plan and consent was signed. Patient is being admitted for inpatient treatment for surgery, pain control, PT, OT, prophylactic antibiotics, VTE prophylaxis, progressive ambulation and ADL's and discharge planning. The patient is planning to be discharge home.   Therapy Plans: HEP Disposition: Home with husband Planned DVT Prophylaxis: Eliquis (a fib) DME needed: none PCP: Dr. Jonni Sanger, Cardio: Dr. Cristopher Peru, clearance received TXA: IV Allergies: adhesives - rash, morphine - itching, neomycin - blistering Anesthesia Concerns: none BMI: 32.4 Last HgbA1c: Not diabetic   Other: - ASR hip components in place > plan to revise acetabular components and head ball - leave stem - Will want PT afterwards ~6 weeks out - Aquacel okay - hydrocodone, robaxin  Costella Hatcher, PA-C Orthopedic Surgery [facility] [phone]

## 2022-02-09 NOTE — Progress Notes (Signed)
Anesthesia Chart Review   Case: [id number] Date/Time: 02/10/22 0715   Procedure: TOTAL HIP REVISION (Right: Hip)   Anesthesia type: Spinal   Pre-op diagnosis: Failed right total hip arthroplasty, metalloysis   Location: [facility]   Surgeons: Paralee Cancel, MD       DISCUSSION:72 y.o. 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 Paralee Cancel.   Per cardiology preoperative evaluation 12/01/2021, "Chart reviewed as part of pre-operative protocol coverage. Given past medical history and time since last visit, based on ACC/AHA guidelines, KEALA DRUM would be at acceptable risk for the planned procedure without further cardiovascular testing.    Her RCRI is a class II risk, 0.9% risk of major cardiac event."  Pt reports last dose of Eliquis 02/06/2022.   Anticipate pt can proceed with planned procedure barring acute status change.   VS: BP 135/78   Pulse 76   Temp 36.7 C (Oral)   Resp 12   Ht '5\' 7"'$  (1.702 m)   Wt 92.3 kg   SpO2 99%   BMI 31.86 kg/m   PROVIDERS: Leamon Arnt, MD is PCP   Primary Cardiologist: [name], MD  LABS: Labs reviewed: Acceptable for surgery. (all labs ordered are listed, but only abnormal results are displayed)  Labs Reviewed  SURGICAL PCR SCREEN  BASIC METABOLIC PANEL  CBC  TYPE AND SCREEN     IMAGES:   EKG:   CV: Echo 11/18/2019 1. Left ventricular ejection fraction, by estimation, is 60 to 65%. The  left ventricle has normal function. The left ventricle has no regional  wall motion abnormalities. Left ventricular diastolic parameters are  consistent with Grade I diastolic  dysfunction (impaired relaxation). The average left ventricular global  longitudinal strain is -24.3 %. The global longitudinal strain is normal.   2. Right  ventricular systolic function is normal. The right ventricular  size is mildly enlarged. There is normal pulmonary artery systolic  pressure. The estimated right ventricular systolic pressure is 02.5 mmHg.   3. Left atrial size was mildly dilated.   4. Right atrial size was mildly dilated.   5. The mitral valve is normal in structure. Mild mitral valve  regurgitation. No evidence of mitral stenosis.   6. Tricuspid valve regurgitation is moderate.   7. The aortic valve is normal in structure. Aortic valve regurgitation is  not visualized. No aortic stenosis is present.   8. The inferior vena cava is normal in size with greater than 50%  respiratory variability, suggesting right atrial pressure of 3 mmHg.  Past Medical History:  Diagnosis Date   AR (allergic rhinitis)    Atrial fibrillation (HCC)    Atrial flutter (HCC)    Clotting disorder (HCC)    Diverticulosis    GERD (gastroesophageal reflux disease)    Hiatal hernia    Hiatal hernia    Hypothyroidism    Migraines    Mild sleep apnea    Osteoarthritis    Osteopenia after menopause 06/14/2017   DEXA 08/2016 [facility]: T = -1.10 radius, -1.0 femur; lumbar spine elevated due to DJD; recheck 2-3 years.   Pacemaker    CHB   Paroxysmal A-fib (HCC)    PUD (peptic ulcer disease)    Sleep apnea    Thyroid nodule    Transient complete heart block 1996  Past Surgical History:  Procedure Laterality Date   ATRIAL FIBRILLATION ABLATION N/A 01/08/2019   Procedure: ATRIAL FIBRILLATION ABLATION;  Surgeon: [name], MD;  Location: [place] [facility];  Service: Cardiovascular;  Laterality: N/A;   ATRIAL FIBRILLATION ABLATION N/A 04/02/2019   Procedure: ATRIAL FIBRILLATION ABLATION;  Surgeon: [name], MD;  Location: [place] [facility];  Service: Cardiovascular;  Laterality: N/A;   CARDIOVERSION N/A 01/28/2019   Procedure: CARDIOVERSION;  Surgeon: Donato Heinz, MD;  Location: [facility] ENDOSCOPY;  Service: Endoscopy;  Laterality:  N/A;   CARDIOVERSION N/A 02/27/2019   Procedure: CARDIOVERSION;  Surgeon: Donato Heinz, MD;  Location: [facility] ENDOSCOPY;  Service: Endoscopy;  Laterality: N/A;   HIP ARTHROPLASTY Right 2008   PACEMAKER GENERATOR CHANGE  03/01/2007   performed by Dr Rosita Fire at [facility]   PACEMAKER INSERTION  09/08/1994   performed in [facility] for transient complete heart block   PPM GENERATOR CHANGEOUT N/A 06/04/2020   Procedure: [place];  Surgeon: [name], MD;  Location: [place] [facility];  Service: Cardiovascular;  Laterality: N/A;   THYROIDECTOMY, PARTIAL  1986   BENIGN NODULES   TOTAL HIP ARTHROPLASTY Left 71/21/2020   Procedure: TOTAL HIP ARTHROPLASTY ANTERIOR APPROACH;  Surgeon: Paralee Cancel, MD;  Location: [facility];  Service: Orthopedics;  Laterality: Left;  70 mins   TUBAL LIGATION  1987    MEDICATIONS:  acetaminophen (TYLENOL) 500 MG tablet   amoxicillin (AMOXIL) 500 MG capsule   apixaban (ELIQUIS) 5 MG TABS tablet   Calcium Carbonate (CALCIUM 500 PO)   Cholecalciferol (VITAMIN D3) 25 MCG (1000 UT) CAPS   Cyanocobalamin (B12 LIQUID HEALTH BOOSTER PO)   diltiazem (CARDIZEM CD) 120 MG 24 hr capsule   dofetilide (TIKOSYN) 500 MCG capsule   erythromycin ophthalmic ointment   ferrous sulfate 325 (65 FE) MG EC tablet   levothyroxine (SYNTHROID) 112 MCG tablet   Magnesium 250 MG TABS   omeprazole (PRILOSEC) 20 MG capsule   Potassium 99 MG TABS   sodium chloride (OCEAN) 0.65 % SOLN nasal spray   traMADol (ULTRAM) 50 MG tablet   vitamin C (ASCORBIC ACID) 500 MG tablet   No current facility-administered medications for this encounter.    Kristin Felix Ward, PA-C [facility] [phone]

## 2022-02-09 NOTE — Anesthesia Preprocedure Evaluation (Addendum)
Anesthesia Evaluation  Patient identified by MRN, date of birth, ID band Patient awake    Reviewed: Allergy & Precautions, NPO status , Patient's Chart, lab work & pertinent test results  History of Anesthesia Complications Negative for: history of anesthetic complications  Airway Mallampati: II  TM Distance: >3 FB Neck ROM: Full    Dental no notable dental hx.    Pulmonary sleep apnea    Pulmonary exam normal        Cardiovascular Normal cardiovascular exam+ dysrhythmias (on Eliquis and Tikosyn) + pacemaker   Echo 11/18/2019: EF 60-65%, grade I DD, RV mildly enlarged, mild RAE/LAE, mild MR, moderate TR    Neuro/Psych negative neurological ROS  negative psych ROS   GI/Hepatic Neg liver ROS, hiatal hernia, PUD,GERD  Medicated,,  Endo/Other  Hypothyroidism    Renal/GU negative Renal ROS  negative genitourinary   Musculoskeletal  (+) Arthritis ,    Abdominal   Peds  Hematology negative hematology ROS (+)   Anesthesia Other Findings Day of surgery medications reviewed with patient.  Reproductive/Obstetrics negative OB ROS                              Anesthesia Physical Anesthesia Plan  ASA: 3  Anesthesia Plan: Spinal   Post-op Pain Management: Tylenol PO (pre-op)*   Induction:   PONV Risk Score and Plan: 2 and Treatment may vary due to age or medical condition, Dexamethasone, Ondansetron and Propofol infusion  Airway Management Planned: Natural Airway and Simple Face Mask  Additional Equipment: None  Intra-op Plan:   Post-operative Plan:   Informed Consent: I have reviewed the patients History and Physical, chart, labs and discussed the procedure including the risks, benefits and alternatives for the proposed anesthesia with the patient or authorized representative who has indicated his/her understanding and acceptance.       Plan Discussed with: CRNA  Anesthesia Plan  Comments: (See PAT note 02/08/2022)        Anesthesia Quick Evaluation

## 2022-02-10 ENCOUNTER — Encounter (HOSPITAL_COMMUNITY): Payer: Self-pay | Admitting: Orthopedic Surgery

## 2022-02-10 ENCOUNTER — Inpatient Hospital Stay (HOSPITAL_COMMUNITY): Payer: Medicare PPO | Admitting: Anesthesiology

## 2022-02-10 ENCOUNTER — Other Ambulatory Visit: Payer: Self-pay

## 2022-02-10 ENCOUNTER — Inpatient Hospital Stay (HOSPITAL_COMMUNITY): Payer: Medicare PPO | Admitting: Physician Assistant

## 2022-02-10 ENCOUNTER — Inpatient Hospital Stay (HOSPITAL_COMMUNITY)
Admission: RE | Admit: 2022-02-10 | Discharge: 2022-02-12 | DRG: 467 | Disposition: A | Discharge status: Home / Self Care | Payer: Medicare PPO | Attending: Orthopedic Surgery | Admitting: Orthopedic Surgery

## 2022-02-10 ENCOUNTER — Encounter (HOSPITAL_COMMUNITY)
Admission: RE | Disposition: A | Discharge status: Home / Self Care | Payer: Self-pay | Source: Ambulatory Visit | Attending: Orthopedic Surgery

## 2022-02-10 ENCOUNTER — Inpatient Hospital Stay (HOSPITAL_COMMUNITY): Payer: Medicare PPO

## 2022-02-10 DIAGNOSIS — E890 Postprocedural hypothyroidism: Secondary | ICD-10-CM | POA: Diagnosis present

## 2022-02-10 DIAGNOSIS — I480 Paroxysmal atrial fibrillation: Secondary | ICD-10-CM | POA: Diagnosis present

## 2022-02-10 DIAGNOSIS — Z96641 Presence of right artificial hip joint: Secondary | ICD-10-CM

## 2022-02-10 DIAGNOSIS — K449 Diaphragmatic hernia without obstruction or gangrene: Secondary | ICD-10-CM | POA: Diagnosis not present

## 2022-02-10 DIAGNOSIS — Z950 Presence of cardiac pacemaker: Secondary | ICD-10-CM | POA: Diagnosis not present

## 2022-02-10 DIAGNOSIS — Z8049 Family history of malignant neoplasm of other genital organs: Secondary | ICD-10-CM

## 2022-02-10 DIAGNOSIS — Z888 Allergy status to other drugs, medicaments and biological substances status: Secondary | ICD-10-CM

## 2022-02-10 DIAGNOSIS — Z01812 Encounter for preprocedural laboratory examination: Secondary | ICD-10-CM

## 2022-02-10 DIAGNOSIS — Y792 Prosthetic and other implants, materials and accessory orthopedic devices associated with adverse incidents: Secondary | ICD-10-CM | POA: Diagnosis present

## 2022-02-10 DIAGNOSIS — Z7901 Long term (current) use of anticoagulants: Secondary | ICD-10-CM | POA: Diagnosis not present

## 2022-02-10 DIAGNOSIS — Z813 Family history of other psychoactive substance abuse and dependence: Secondary | ICD-10-CM | POA: Diagnosis not present

## 2022-02-10 DIAGNOSIS — Z79890 Hormone replacement therapy (postmenopausal): Secondary | ICD-10-CM

## 2022-02-10 DIAGNOSIS — K219 Gastro-esophageal reflux disease without esophagitis: Secondary | ICD-10-CM | POA: Diagnosis present

## 2022-02-10 DIAGNOSIS — M8580 Other specified disorders of bone density and structure, unspecified site: Secondary | ICD-10-CM | POA: Diagnosis present

## 2022-02-10 DIAGNOSIS — G4733 Obstructive sleep apnea (adult) (pediatric): Secondary | ICD-10-CM | POA: Diagnosis present

## 2022-02-10 DIAGNOSIS — I442 Atrioventricular block, complete: Secondary | ICD-10-CM | POA: Diagnosis present

## 2022-02-10 DIAGNOSIS — T84010A Broken internal right hip prosthesis, initial encounter: Secondary | ICD-10-CM | POA: Diagnosis not present

## 2022-02-10 DIAGNOSIS — Z825 Family history of asthma and other chronic lower respiratory diseases: Secondary | ICD-10-CM | POA: Diagnosis not present

## 2022-02-10 DIAGNOSIS — Z01818 Encounter for other preprocedural examination: Secondary | ICD-10-CM

## 2022-02-10 DIAGNOSIS — G4730 Sleep apnea, unspecified: Secondary | ICD-10-CM

## 2022-02-10 DIAGNOSIS — Z79899 Other long term (current) drug therapy: Secondary | ICD-10-CM

## 2022-02-10 DIAGNOSIS — Z8711 Personal history of peptic ulcer disease: Secondary | ICD-10-CM | POA: Diagnosis not present

## 2022-02-10 DIAGNOSIS — Z885 Allergy status to narcotic agent status: Secondary | ICD-10-CM

## 2022-02-10 DIAGNOSIS — T84090A Other mechanical complication of internal right hip prosthesis, initial encounter: Principal | ICD-10-CM | POA: Diagnosis present

## 2022-02-10 DIAGNOSIS — Z96649 Presence of unspecified artificial hip joint: Secondary | ICD-10-CM

## 2022-02-10 DIAGNOSIS — Z8261 Family history of arthritis: Secondary | ICD-10-CM

## 2022-02-10 DIAGNOSIS — Z833 Family history of diabetes mellitus: Secondary | ICD-10-CM

## 2022-02-10 DIAGNOSIS — E039 Hypothyroidism, unspecified: Secondary | ICD-10-CM | POA: Diagnosis not present

## 2022-02-10 HISTORY — PX: TOTAL HIP REVISION: SHX763

## 2022-02-10 LAB — SYNOVIAL CELL COUNT + DIFF, W/ CRYSTALS
Crystals, Fluid: NONE SEEN
WBC, Synovial: 1733 /mm3 — ABNORMAL HIGH (ref 0–200)

## 2022-02-10 LAB — TYPE AND SCREEN
ABO/RH(D): A POS
Antibody Screen: NEGATIVE

## 2022-02-10 SURGERY — TOTAL HIP REVISION
Anesthesia: Spinal | Site: Hip | Laterality: Right

## 2022-02-10 MED ORDER — MIDAZOLAM HCL 2 MG/2ML IJ SOLN
INTRAMUSCULAR | Status: AC
  Filled 2022-02-10: qty 2

## 2022-02-10 MED ORDER — APIXABAN 5 MG PO TABS
5.0000 mg | ORAL_TABLET | Freq: Two times a day (BID) | ORAL | Status: DC
  Administered 2022-02-11 – 2022-02-12 (×3): 5 mg via ORAL
  Filled 2022-02-10 (×3): qty 1

## 2022-02-10 MED ORDER — ACETAMINOPHEN 325 MG PO TABS: 325.0000 mg | ORAL_TABLET | Freq: Four times a day (QID) | ORAL | Status: DC | PRN

## 2022-02-10 MED ORDER — LACTATED RINGERS IV SOLN: INTRAVENOUS | Status: DC

## 2022-02-10 MED ORDER — ORAL CARE MOUTH RINSE: 15.0000 mL | Freq: Once | OROMUCOSAL | Status: AC

## 2022-02-10 MED ORDER — PANTOPRAZOLE SODIUM 40 MG PO TBEC
40.0000 mg | DELAYED_RELEASE_TABLET | Freq: Every day | ORAL | Status: DC
  Administered 2022-02-11 – 2022-02-12 (×2): 40 mg via ORAL
  Filled 2022-02-10 (×2): qty 1

## 2022-02-10 MED ORDER — PHENYLEPHRINE HCL-NACL 20-0.9 MG/250ML-% IV SOLN
INTRAVENOUS | Status: DC | PRN
  Administered 2022-02-10: 30 ug/min via INTRAVENOUS

## 2022-02-10 MED ORDER — ONDANSETRON HCL 4 MG/2ML IJ SOLN
INTRAMUSCULAR | Status: AC
  Filled 2022-02-10: qty 2

## 2022-02-10 MED ORDER — FENTANYL CITRATE (PF) 100 MCG/2ML IJ SOLN
INTRAMUSCULAR | Status: DC | PRN
  Administered 2022-02-10: 50 ug via INTRAVENOUS

## 2022-02-10 MED ORDER — METHOCARBAMOL 500 MG IVPB - SIMPLE MED: 500.0000 mg | Freq: Four times a day (QID) | INTRAVENOUS | Status: DC | PRN

## 2022-02-10 MED ORDER — MENTHOL 3 MG MT LOZG: 1.0000 | LOZENGE | OROMUCOSAL | Status: DC | PRN

## 2022-02-10 MED ORDER — BUPIVACAINE HCL 0.5 % IJ SOLN
INTRAMUSCULAR | Status: DC | PRN
  Administered 2022-02-10: 30 mL

## 2022-02-10 MED ORDER — DILTIAZEM HCL ER COATED BEADS 120 MG PO CP24
120.0000 mg | ORAL_CAPSULE | Freq: Two times a day (BID) | ORAL | Status: DC
  Administered 2022-02-10 – 2022-02-12 (×4): 120 mg via ORAL
  Filled 2022-02-10 (×5): qty 1

## 2022-02-10 MED ORDER — STERILE WATER FOR IRRIGATION IR SOLN
Status: DC | PRN
  Administered 2022-02-10 (×2): 1000 mL

## 2022-02-10 MED ORDER — DEXAMETHASONE SODIUM PHOSPHATE 4 MG/ML IJ SOLN
INTRAMUSCULAR | Status: DC | PRN
  Administered 2022-02-10: 8 mg via INTRAVENOUS

## 2022-02-10 MED ORDER — PHENOL 1.4 % MT LIQD: 1.0000 | OROMUCOSAL | Status: DC | PRN

## 2022-02-10 MED ORDER — KETAMINE HCL 50 MG/5ML IJ SOSY
PREFILLED_SYRINGE | INTRAMUSCULAR | Status: AC
  Filled 2022-02-10: qty 5

## 2022-02-10 MED ORDER — ALBUMIN HUMAN 5 % IV SOLN: INTRAVENOUS | Status: DC | PRN

## 2022-02-10 MED ORDER — DOCUSATE SODIUM 100 MG PO CAPS
100.0000 mg | ORAL_CAPSULE | Freq: Two times a day (BID) | ORAL | Status: DC
  Administered 2022-02-10 – 2022-02-12 (×4): 100 mg via ORAL
  Filled 2022-02-10 (×4): qty 1

## 2022-02-10 MED ORDER — MORPHINE SULFATE (PF) 2 MG/ML IV SOLN
0.5000 mg | INTRAVENOUS | Status: DC | PRN
  Filled 2022-02-10: qty 1

## 2022-02-10 MED ORDER — CEFAZOLIN SODIUM-DEXTROSE 2-4 GM/100ML-% IV SOLN
2.0000 g | Freq: Four times a day (QID) | INTRAVENOUS | Status: AC
  Administered 2022-02-10 (×2): 2 g via INTRAVENOUS
  Filled 2022-02-10 (×2): qty 100

## 2022-02-10 MED ORDER — FENTANYL CITRATE (PF) 100 MCG/2ML IJ SOLN
INTRAMUSCULAR | Status: AC
  Filled 2022-02-10: qty 2

## 2022-02-10 MED ORDER — METOCLOPRAMIDE HCL 5 MG PO TABS: 5.0000 mg | ORAL_TABLET | Freq: Three times a day (TID) | ORAL | Status: DC | PRN

## 2022-02-10 MED ORDER — SODIUM CHLORIDE (PF) 0.9 % IJ SOLN
INTRAMUSCULAR | Status: DC | PRN
  Administered 2022-02-10 (×3): 10 mL

## 2022-02-10 MED ORDER — PROPOFOL 500 MG/50ML IV EMUL
INTRAVENOUS | Status: DC | PRN
  Administered 2022-02-10: 100 ug/kg/min via INTRAVENOUS

## 2022-02-10 MED ORDER — SODIUM CHLORIDE (PF) 0.9 % IJ SOLN
INTRAMUSCULAR | Status: AC
  Filled 2022-02-10: qty 30

## 2022-02-10 MED ORDER — DOFETILIDE 250 MCG PO CAPS
500.0000 ug | ORAL_CAPSULE | Freq: Two times a day (BID) | ORAL | Status: DC
  Administered 2022-02-10: 500 ug via ORAL
  Filled 2022-02-10: qty 2

## 2022-02-10 MED ORDER — DEXAMETHASONE SODIUM PHOSPHATE 10 MG/ML IJ SOLN
10.0000 mg | Freq: Once | INTRAMUSCULAR | Status: AC
  Administered 2022-02-11: 10 mg via INTRAVENOUS
  Filled 2022-02-10: qty 1

## 2022-02-10 MED ORDER — DOFETILIDE 250 MCG PO CAPS
500.0000 ug | ORAL_CAPSULE | Freq: Two times a day (BID) | ORAL | Status: DC
  Administered 2022-02-11 – 2022-02-12 (×3): 500 ug via ORAL
  Filled 2022-02-10 (×3): qty 2

## 2022-02-10 MED ORDER — MIDAZOLAM HCL 5 MG/5ML IJ SOLN
INTRAMUSCULAR | Status: DC | PRN
  Administered 2022-02-10: 1 mg via INTRAVENOUS

## 2022-02-10 MED ORDER — ONDANSETRON HCL 4 MG PO TABS: 4.0000 mg | ORAL_TABLET | Freq: Four times a day (QID) | ORAL | Status: DC | PRN

## 2022-02-10 MED ORDER — HYDROCODONE-ACETAMINOPHEN 5-325 MG PO TABS
1.0000 | ORAL_TABLET | ORAL | Status: DC | PRN
  Administered 2022-02-10 – 2022-02-12 (×9): 2 via ORAL
  Filled 2022-02-10 (×9): qty 2

## 2022-02-10 MED ORDER — KETAMINE HCL 10 MG/ML IJ SOLN
INTRAMUSCULAR | Status: DC | PRN
  Administered 2022-02-10: 20 mg via INTRAVENOUS

## 2022-02-10 MED ORDER — PHENYLEPHRINE HCL (PRESSORS) 10 MG/ML IV SOLN
INTRAVENOUS | Status: AC
  Filled 2022-02-10: qty 1

## 2022-02-10 MED ORDER — HYDROCODONE-ACETAMINOPHEN 7.5-325 MG PO TABS: 1.0000 | ORAL_TABLET | ORAL | Status: DC | PRN

## 2022-02-10 MED ORDER — OXYCODONE HCL 5 MG/5ML PO SOLN: 5.0000 mg | Freq: Once | ORAL | Status: DC | PRN

## 2022-02-10 MED ORDER — METHOCARBAMOL 500 MG PO TABS
500.0000 mg | ORAL_TABLET | Freq: Four times a day (QID) | ORAL | Status: DC | PRN
  Administered 2022-02-10 – 2022-02-12 (×7): 500 mg via ORAL
  Filled 2022-02-10 (×7): qty 1

## 2022-02-10 MED ORDER — PHENYLEPHRINE HCL (PRESSORS) 10 MG/ML IV SOLN
INTRAVENOUS | Status: DC | PRN
  Administered 2022-02-10 (×3): 160 ug via INTRAVENOUS

## 2022-02-10 MED ORDER — OXYCODONE HCL 5 MG PO TABS: 5.0000 mg | ORAL_TABLET | Freq: Once | ORAL | Status: DC | PRN

## 2022-02-10 MED ORDER — BUPIVACAINE HCL (PF) 0.5 % IJ SOLN
INTRAMUSCULAR | Status: AC
  Filled 2022-02-10: qty 30

## 2022-02-10 MED ORDER — POLYETHYLENE GLYCOL 3350 17 G PO PACK
17.0000 g | PACK | Freq: Two times a day (BID) | ORAL | Status: DC
  Administered 2022-02-10 – 2022-02-12 (×4): 17 g via ORAL
  Filled 2022-02-10 (×4): qty 1

## 2022-02-10 MED ORDER — METOCLOPRAMIDE HCL 5 MG/ML IJ SOLN: 5.0000 mg | Freq: Three times a day (TID) | INTRAMUSCULAR | Status: DC | PRN

## 2022-02-10 MED ORDER — CEFAZOLIN SODIUM-DEXTROSE 2-4 GM/100ML-% IV SOLN
2.0000 g | INTRAVENOUS | Status: AC
  Administered 2022-02-10: 2 g via INTRAVENOUS
  Filled 2022-02-10: qty 100

## 2022-02-10 MED ORDER — DEXAMETHASONE SODIUM PHOSPHATE 10 MG/ML IJ SOLN: 8.0000 mg | Freq: Once | INTRAMUSCULAR | Status: DC

## 2022-02-10 MED ORDER — DEXAMETHASONE SODIUM PHOSPHATE 10 MG/ML IJ SOLN
INTRAMUSCULAR | Status: AC
  Filled 2022-02-10: qty 1

## 2022-02-10 MED ORDER — ONDANSETRON HCL 4 MG/2ML IJ SOLN: 4.0000 mg | Freq: Four times a day (QID) | INTRAMUSCULAR | Status: DC | PRN

## 2022-02-10 MED ORDER — KETOROLAC TROMETHAMINE 30 MG/ML IJ SOLN
INTRAMUSCULAR | Status: DC | PRN
  Administered 2022-02-10: 30 mg

## 2022-02-10 MED ORDER — AMISULPRIDE (ANTIEMETIC) 5 MG/2ML IV SOLN: 10.0000 mg | Freq: Once | INTRAVENOUS | Status: DC | PRN

## 2022-02-10 MED ORDER — TRANEXAMIC ACID-NACL 1000-0.7 MG/100ML-% IV SOLN
1000.0000 mg | Freq: Once | INTRAVENOUS | Status: AC
  Administered 2022-02-10: 1000 mg via INTRAVENOUS

## 2022-02-10 MED ORDER — 0.9 % SODIUM CHLORIDE (POUR BTL) OPTIME
TOPICAL | Status: DC | PRN
  Administered 2022-02-10: 1000 mL

## 2022-02-10 MED ORDER — TRANEXAMIC ACID-NACL 1000-0.7 MG/100ML-% IV SOLN
INTRAVENOUS | Status: AC
  Filled 2022-02-10: qty 100

## 2022-02-10 MED ORDER — BISACODYL 10 MG RE SUPP: 10.0000 mg | Freq: Every day | RECTAL | Status: DC | PRN

## 2022-02-10 MED ORDER — TRANEXAMIC ACID-NACL 1000-0.7 MG/100ML-% IV SOLN
1000.0000 mg | INTRAVENOUS | Status: AC
  Administered 2022-02-10: 1000 mg via INTRAVENOUS
  Filled 2022-02-10: qty 100

## 2022-02-10 MED ORDER — BUPIVACAINE HCL (PF) 0.5 % IJ SOLN
INTRAMUSCULAR | Status: DC | PRN
  Administered 2022-02-10: 3 mL via INTRATHECAL

## 2022-02-10 MED ORDER — ACETAMINOPHEN 500 MG PO TABS
1000.0000 mg | ORAL_TABLET | Freq: Once | ORAL | Status: AC
  Administered 2022-02-10: 1000 mg via ORAL
  Filled 2022-02-10: qty 2

## 2022-02-10 MED ORDER — CHLORHEXIDINE GLUCONATE 0.12 % MT SOLN
15.0000 mL | Freq: Once | OROMUCOSAL | Status: AC
  Administered 2022-02-10: 15 mL via OROMUCOSAL

## 2022-02-10 MED ORDER — KETOROLAC TROMETHAMINE 30 MG/ML IJ SOLN
INTRAMUSCULAR | Status: AC
  Filled 2022-02-10: qty 1

## 2022-02-10 MED ORDER — HYDRALAZINE HCL 20 MG/ML IJ SOLN: 10.0000 mg | Freq: Once | INTRAMUSCULAR | Status: DC

## 2022-02-10 MED ORDER — MAGNESIUM 250 MG PO TABS: 250.0000 mg | ORAL_TABLET | Freq: Every evening | ORAL | Status: DC

## 2022-02-10 MED ORDER — LEVOTHYROXINE SODIUM 112 MCG PO TABS
112.0000 ug | ORAL_TABLET | Freq: Every day | ORAL | Status: DC
  Administered 2022-02-11 – 2022-02-12 (×2): 112 ug via ORAL
  Filled 2022-02-10 (×2): qty 1

## 2022-02-10 MED ORDER — MAGNESIUM OXIDE -MG SUPPLEMENT 400 (240 MG) MG PO TABS
200.0000 mg | ORAL_TABLET | Freq: Every evening | ORAL | Status: DC
  Administered 2022-02-10 – 2022-02-11 (×2): 200 mg via ORAL
  Filled 2022-02-10 (×2): qty 1

## 2022-02-10 MED ORDER — DIPHENHYDRAMINE HCL 12.5 MG/5ML PO ELIX: 12.5000 mg | ORAL_SOLUTION | ORAL | Status: DC | PRN

## 2022-02-10 MED ORDER — FENTANYL CITRATE PF 50 MCG/ML IJ SOSY: 25.0000 ug | PREFILLED_SYRINGE | INTRAMUSCULAR | Status: DC | PRN

## 2022-02-10 MED ORDER — POVIDONE-IODINE 10 % EX SWAB
2.0000 | Freq: Once | CUTANEOUS | Status: AC
  Administered 2022-02-10: 2 via TOPICAL

## 2022-02-10 MED ORDER — SODIUM CHLORIDE 0.9 % IV SOLN: INTRAVENOUS | Status: DC

## 2022-02-10 MED ORDER — EPHEDRINE SULFATE (PRESSORS) 50 MG/ML IJ SOLN
INTRAMUSCULAR | Status: DC | PRN
  Administered 2022-02-10 (×5): 5 mg via INTRAVENOUS

## 2022-02-10 SURGICAL SUPPLY — 54 items
BAG COUNTER SPONGE SURGICOUNT (BAG) IMPLANT
BAG DECANTER FOR FLEXI CONT (MISCELLANEOUS) ×1 IMPLANT
BAG ZIPLOCK 12X15 (MISCELLANEOUS) ×1 IMPLANT
BLADE SAW SGTL 18X1.27X75 (BLADE) IMPLANT
BLADE SAW SGTL 81X20 HD (BLADE) ×1 IMPLANT
BRUSH FEMORAL CANAL (MISCELLANEOUS) IMPLANT
COVER SURGICAL LIGHT HANDLE (MISCELLANEOUS) ×1 IMPLANT
CUP SECTOR GRIPTON 58MM (Orthopedic Implant) IMPLANT
DERMABOND ADVANCED .7 DNX12 (GAUZE/BANDAGES/DRESSINGS) ×1 IMPLANT
DRAPE INCISE IOBAN 85X60 (DRAPES) ×1 IMPLANT
DRAPE ORTHO SPLIT 77X108 STRL (DRAPES) ×2
DRAPE POUCH INSTRU U-SHP 10X18 (DRAPES) ×1 IMPLANT
DRAPE SURG 17X11 SM STRL (DRAPES) ×1 IMPLANT
DRAPE SURG ORHT 6 SPLT 77X108 (DRAPES) ×2 IMPLANT
DRAPE U-SHAPE 47X51 STRL (DRAPES) ×1 IMPLANT
DRSG AQUACEL AG ADV 3.5X10 (GAUZE/BANDAGES/DRESSINGS) IMPLANT
DRSG AQUACEL AG ADV 3.5X14 (GAUZE/BANDAGES/DRESSINGS) IMPLANT
DURAPREP 26ML APPLICATOR (WOUND CARE) ×1 IMPLANT
ELECT BLADE TIP CTD 4 INCH (ELECTRODE) ×1 IMPLANT
ELECT REM PT RETURN 15FT ADLT (MISCELLANEOUS) ×1 IMPLANT
FACESHIELD WRAPAROUND (MASK) ×4 IMPLANT
FACESHIELD WRAPAROUND OR TEAM (MASK) ×4 IMPLANT
GLOVE BIO SURGEON STRL SZ 6 (GLOVE) ×1 IMPLANT
GLOVE BIOGEL PI IND STRL 6.5 (GLOVE) ×1 IMPLANT
GLOVE BIOGEL PI IND STRL 7.5 (GLOVE) ×1 IMPLANT
GLOVE ORTHO TXT STRL SZ7.5 (GLOVE) ×2 IMPLANT
GOWN STRL REUS W/ TWL LRG LVL3 (GOWN DISPOSABLE) ×3 IMPLANT
GOWN STRL REUS W/TWL LRG LVL3 (GOWN DISPOSABLE) ×3
HANDPIECE INTERPULSE COAX TIP (DISPOSABLE) ×1
HEAD FEM BIOLOX DELTA 36 8.5 (Orthopedic Implant) IMPLANT
KIT BASIN OR (CUSTOM PROCEDURE TRAY) ×1 IMPLANT
KIT TURNOVER KIT A (KITS) IMPLANT
LINER NEUTRAL 36X58 PLUS4 IMPLANT
MANIFOLD NEPTUNE II (INSTRUMENTS) ×1 IMPLANT
NDL SAFETY ECLIP 18X1.5 (MISCELLANEOUS) ×1 IMPLANT
NS IRRIG 1000ML POUR BTL (IV SOLUTION) ×1 IMPLANT
PACK TOTAL JOINT (CUSTOM PROCEDURE TRAY) ×1 IMPLANT
PROTECTOR NERVE ULNAR (MISCELLANEOUS) ×1 IMPLANT
SCREW 6.5MMX40MM (Screw) IMPLANT
SCREW PINN CAN BONE 6.5MMX15MM (Screw) IMPLANT
SET HNDPC FAN SPRY TIP SCT (DISPOSABLE) ×1 IMPLANT
SLEEVE SUCTION 125 (MISCELLANEOUS) ×1 IMPLANT
SOLUTION IRRIG SURGIPHOR (IV SOLUTION) IMPLANT
STAPLER VISISTAT 35W (STAPLE) IMPLANT
SUCTION FRAZIER HANDLE 12FR (TUBING) ×1
SUCTION TUBE FRAZIER 12FR DISP (TUBING) ×1 IMPLANT
SUT STRATAFIX PDS+ 0 24IN (SUTURE) ×1 IMPLANT
SUT VIC AB 1 CT1 36 (SUTURE) ×1 IMPLANT
SUT VIC AB 2-0 CT1 27 (SUTURE) ×2
SUT VIC AB 2-0 CT1 TAPERPNT 27 (SUTURE) ×2 IMPLANT
TOWEL OR 17X26 10 PK STRL BLUE (TOWEL DISPOSABLE) ×2 IMPLANT
TRAY FOLEY MTR SLVR 16FR STAT (SET/KITS/TRAYS/PACK) ×1 IMPLANT
TUBE SUCTION HIGH CAP CLEAR NV (SUCTIONS) ×1 IMPLANT
WATER STERILE IRR 1000ML POUR (IV SOLUTION) ×2 IMPLANT

## 2022-02-10 NOTE — Transfer of Care (Signed)
Immediate Anesthesia Transfer of Care Note  Patient: Kristin [name]  Procedure(s) Performed: TOTAL HIP REVISION (Right: Hip)  Patient Location: PACU  Anesthesia Type:Spinal  Level of Consciousness: awake, alert , oriented, and patient cooperative  Airway & Oxygen Therapy: Patient Spontanous Breathing and Patient connected to face mask oxygen  Post-op Assessment: Report given to RN and Post -op Vital signs reviewed and stable  Post vital signs: Reviewed and stable  Last Vitals:  Vitals Value Taken Time  BP    Temp    Pulse    Resp    SpO2      Last Pain:  Vitals:   02/10/22 0602  TempSrc:   PainSc: 0-No pain         Complications: No notable events documented.

## 2022-02-10 NOTE — Anesthesia Procedure Notes (Signed)
Spinal  Patient location during procedure: OR Start time: 02/10/2022 7:30 AM End time: 02/10/2022 7:33 AM Reason for block: surgical anesthesia Staffing Performed: anesthesiologist  Anesthesiologist: Brennan Bailey, MD Performed by: Brennan Bailey, MD Authorized by: Brennan Bailey, MD   Preanesthetic Checklist Completed: patient identified, IV checked, risks and benefits discussed, surgical consent, monitors and equipment checked, pre-op evaluation and timeout performed Spinal Block Patient position: sitting Prep: DuraPrep and site prepped and draped Patient monitoring: continuous pulse ox, blood pressure and heart rate Approach: midline Location: L3-4 Injection technique: single-shot Needle Needle type: Pencan  Needle gauge: 24 G Needle length: 9 cm Assessment Events: CSF return Additional Notes Risks, benefits, and alternative discussed. Patient gave consent to procedure. Prepped and draped in sitting position. Patient sedated but responsive to voice. 2 attempts required at L3-4. Clear CSF obtained. Positive terminal aspiration. No pain or paraesthesias with injection. Patient tolerated procedure well. Vital signs stable. Tawny Asal, MD

## 2022-02-10 NOTE — Progress Notes (Signed)
Pt has arrived to 1336 from pacu s/p right total hip revision.  Report accepted from [place],RN.  Pt is alert and oriented.  Room orientation completed with call bell placed at bedside.  Care continues.

## 2022-02-10 NOTE — Plan of Care (Signed)
  Problem: Education: Goal: Knowledge of the prescribed therapeutic regimen will improve Outcome: Progressing Goal: Understanding of discharge needs will improve Outcome: Progressing Goal: Individualized Educational Video(s) Outcome: Progressing   Problem: Activity: Goal: Ability to avoid complications of mobility impairment will improve Outcome: Progressing Goal: Ability to tolerate increased activity will improve Outcome: Progressing   Problem: Clinical Measurements: Goal: Postoperative complications will be avoided or minimized Outcome: Progressing   Problem: Pain Management: Goal: Pain level will decrease with appropriate interventions Outcome: Progressing   Problem: Skin Integrity: Goal: Will show signs of wound healing Outcome: Progressing   Problem: Education: Goal: Knowledge of General Education information will improve Description: Including pain rating scale, medication(s)/side effects and non-pharmacologic comfort measures Outcome: Progressing   Problem: Health Behavior/Discharge Planning: Goal: Ability to manage health-related needs will improve Outcome: Progressing   Problem: Clinical Measurements: Goal: Ability to maintain clinical measurements within normal limits will improve Outcome: Progressing Goal: Will remain free from infection Outcome: Progressing Goal: Diagnostic test results will improve Outcome: Progressing Goal: Respiratory complications will improve Outcome: Progressing Goal: Cardiovascular complication will be avoided Outcome: Progressing   Problem: Activity: Goal: Risk for activity intolerance will decrease Outcome: Progressing   Problem: Coping: Goal: Level of anxiety will decrease Outcome: Progressing   Problem: Elimination: Goal: Will not experience complications related to bowel motility Outcome: Progressing Goal: Will not experience complications related to urinary retention Outcome: Progressing

## 2022-02-10 NOTE — Brief Op Note (Signed)
02/10/2022  8:55 AM  PATIENT:  Kristin [name]  72 y.o. female  PRE-OPERATIVE DIAGNOSIS:  Failed right total hip arthroplasty, metallosis  POST-OPERATIVE DIAGNOSIS:  Failed right total hip arthroplasty, metallosis  PROCEDURE:  Procedure(s): TOTAL HIP REVISION (Right)  SURGEON:  Surgeon(s) and Role:    Paralee Cancel, MD - Primary  PHYSICIAN ASSISTANT: Costella Hatcher, PA-C  ANESTHESIA:   spinal  EBL:  600 mL   BLOOD ADMINISTERED:none  DRAINS: none   LOCAL MEDICATIONS USED:  NONE  SPECIMEN:  Source of Specimen:  right hip synovial fluid and right hip tissue  DISPOSITION OF SPECIMEN:  PATHOLOGY  COUNTS:  YES  TOURNIQUET:  * No tourniquets in log *  DICTATION: .Other Dictation: Dictation Number [id number]  PLAN OF CARE: Admit to inpatient   PATIENT DISPOSITION:  PACU - hemodynamically stable.   Delay start of Pharmacological VTE agent (>24hrs) due to surgical blood loss or risk of bleeding: no

## 2022-02-10 NOTE — Progress Notes (Signed)
Assumed care of patient at 1330.

## 2022-02-10 NOTE — Op Note (Signed)
[name], [name] MEDICAL RECORD NO: [id number] ACCOUNT NO: [id number] DATE OF BIRTH: October 19, 1950 FACILITY: [facility] LOCATION: [facility] PHYSICIAN: Pietro Cassis. 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  PROCEDURE:  Revision right total hip arthroplasty.  COMPONENTS USED:  A size 58 Pinnacle Gription acetabular shell with 2 cancellous screws in the ilium.  A 36+4 neutral AltrX liner.  A size 36+8.5 titanium sleeve insert ceramic ball.  SURGEON:  Pietro Cassis. Alvan Dame, MD  ASSISTANT:  Costella Hatcher, PA-C.  Note, [name] was present for the entirety of the case from preoperative positioning, perioperative management of the operative extremity, general facilitation of the case and primary wound closure.  ANESTHESIA:  Spinal.  BLOOD LOSS:  048 mL  COMPLICATIONS:  None.  DRAINS:  None.  INDICATIONS FOR THE PROCEDURE:  The patient is a pleasant 72 year old female with history of right total hip arthroplasty from 2011 with DePuy metal ASR components.  We have been following her over the years for some ongoing right hip discomfort.  We had  been ordering serial serum cobalt and chromium levels noting recently an increase in these values.  She was unable to have an MRI.  A preoperative CT scan was ordered.  It had indicated no significant fluid collection and no evidence of any significant  muscle damage.  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  We discussed the risks of instability, neurovascular injury, DVT and infection.  Consent was obtained for management of her pain and improved  function.  DESCRIPTION OF PROCEDURE:  The patient was brought to the operative theater.  Once adequate anesthesia, preoperative antibiotics administered, she was positioned into the left lateral decubitus position with the right hip up.  The right lower extremity  was then prepped and draped in sterile fashion.  We marked out on the skin at her old incision.  I extended it slightly proximal and distal for exposure in the revision setting.  A timeout was performed identifying the patient, planned procedure, and  extremity.  Incision was made soft tissue exposure was carried down to the iliotibial band and gluteal fascia.  This was incised.  Once I incised this area there was a significant amount of fluid in the lateral aspect of the hip.  It had an inflammatory  appearance.  There was no significant obvious metal staining to this.  Based on the appearance of it I did send it off to the lab for analysis.  However, in the setting of her presentation clinically it appears to be more of a reactive synovitic response  to her metal as opposed to infection, etiology as this was not the clinical presentation she presented with.  Given these findings and following initial evacuation I performed a complete synovectomy posteriorly and then carried this as I dissected down  to the hip joint, anterior and inferior.  Once this was carried out and the posterior aspect of the hip was exposed.  I did identify there was significant uncoverage of her shell posteriorly with very limited posterior wall.  The cup did appear to be  seated within the acetabulum with exposed anterior rim.  Following initial exposure  including the superior ilium elevation of the gluteus tendons and capsule dislocated the hip.  We removed the femoral head.  I was able to place the trunnion on the ilium  and further debride synovium.  Once I was satisfied with the overall debridement of the synovium, we used the Innomed cup extractors and removed the  acetabular shell without significant bone loss.  I evaluated the size of her acetabulum and reamed  carefully up to just a 55 mm.  I ended up selecting the 58 mm Pinnacle shell after placing some of the trial reamers in place.  I did not want to further remove any bone.  The 58 Pinnacle Gription cup was selected.  This was a sector 3 hole cup.  It was  impacted with a good initial fit.  Using the cup angle assessment to identify that her cup was about 35 degrees of abduction and forward flexed over 20 degrees.  I placed 2 cancellous screws in the ilium, 1 size 40 anteriorly with good fixation and 1  posterior with minimal fixation with a size 15 screw given the thin nature of her posterior wall.  The trial liner was then placed as the neutral liner.  We then trialed the head balls.  I reduced the 36+8.5 ball and found that her hip had no evidence of  any instability with forward flexion, internal rotation.  There was no evidence of any shuck extension.  Her combined anteversion was probably at least 50 degrees.  There was minimal evidence of impingement posteriorly with extension and external  rotation.  Given all these findings, I dislocated the hip, removed all the trial components.  The final 36+4 neutral AltrX liner to match the 58 shell was opened impacted into a clean and dried shell.  The final 36+8.5 titanium sleeve insert ceramic ball  was then impacted onto a clean and dried trunnion.  I had removed some of the metalosis debris around the trunnion using the Bovie scratch pad.  I took care not to disrupt the native trunnion itself, but just around the inferior neck.  The hip was then  reduced.  We irrigated the hip throughout the case with 1 liter of normal saline solution pulse lavage.  This was finalized.  IDENTIFICATION AND FINDINGS: Intraoperatively I did note that her greater trochanter was somewhat void of gluteal muscle tendon.  She did have the medius tendon intact shows I could palpate  this around the superior trochanter and then anteriorly more the  gluteus medius tendon.  Once we completed this, I reapproximated the iliotibial band and gluteal fascia using a combination of #1 Vicryl and #1 Stratafix suture.  The remainder of the wound was closed with 2-0 Vicryl and a running Monocryl stitch.  The  knee was then cleaned, dried and dressed sterilely using surgical glue and Aquacel dressing.  She was brought to the recovery room in stable condition, tolerating the procedure well.  Postoperatively, she will be partial weightbearing for up to 6 weeks to allow for acetabular bone ingrowth.  We will also have her be mindful of posterior hip precautions based on the gluteal findings as well as the overall appearance of her soft  tissues, perhaps related to metalosis damage.   PUS D: 02/10/2022 10:32:40 am T: 02/10/2022 11:36:00 am  JOB: [id number]/ [id number]

## 2022-02-10 NOTE — Discharge Instructions (Addendum)
INSTRUCTIONS AFTER JOINT REPLACEMENT   Remove items at home which could result in a fall. This includes throw rugs or furniture in walking pathways ICE to the affected joint every three hours while awake for 30 minutes at a time, for at least the first 3-5 days, and then as needed for pain and swelling.  Continue to use ice for pain and swelling. You may notice swelling that will progress down to the foot and ankle.  This is normal after surgery.  Elevate your leg when you are not up walking on it.   Continue to use the breathing machine you got in the hospital (incentive spirometer) which will help keep your temperature down.  It is common for your temperature to cycle up and down following surgery, especially at night when you are not up moving around and exerting yourself.  The breathing machine keeps your lungs expanded and your temperature down.   DIET:  As you were doing prior to hospitalization, we recommend a well-balanced diet.  DRESSING / WOUND CARE / SHOWERING  Keep the surgical dressing until follow up.  The dressing is water proof, so you can shower without any extra covering.  IF THE DRESSING FALLS OFF or the wound gets wet inside, change the dressing with sterile gauze.  Please use good hand washing techniques before changing the dressing.  Do not use any lotions or creams on the incision until instructed by your surgeon.    ACTIVITY  Increase activity slowly as tolerated, but follow the weight bearing instructions below.   No driving for 6 weeks or until further direction given by your physician.  You cannot drive while taking narcotics.  No lifting or carrying greater than 10 lbs. until further directed by your surgeon. Avoid periods of inactivity such as sitting longer than an hour when not asleep. This helps prevent blood clots.  You may return to work once you are authorized by your doctor.     WEIGHT BEARING   Partial weight bearing with assist device as directed.       EXERCISES  Results after joint replacement surgery are often greatly improved when you follow the exercise, range of motion and muscle strengthening exercises prescribed by your doctor. Safety measures are also important to protect the joint from further injury. Any time any of these exercises cause you to have increased pain or swelling, decrease what you are doing until you are comfortable again and then slowly increase them. If you have problems or questions, call your caregiver or physical therapist for advice.   Rehabilitation is important following a joint replacement. After just a few days of immobilization, the muscles of the leg can become weakened and shrink (atrophy).  These exercises are designed to build up the tone and strength of the thigh and leg muscles and to improve motion. Often times heat used for twenty to thirty minutes before working out will loosen up your tissues and help with improving the range of motion but do not use heat for the first two weeks following surgery (sometimes heat can increase post-operative swelling).   These exercises can be done on a training (exercise) mat, on the floor, on a table or on a bed. Use whatever works the best and is most comfortable for you.    Use music or television while you are exercising so that the exercises are a pleasant break in your day. This will make your life better with the exercises acting as a break in your routine   that you can look forward to.   Perform all exercises about fifteen times, three times per day or as directed.  You should exercise both the operative leg and the other leg as well.  Exercises include:   Quad Sets - Tighten up the muscle on the front of the thigh (Quad) and hold for 5-10 seconds.   Straight Leg Raises - With your knee straight (if you were given a brace, keep it on), lift the leg to 60 degrees, hold for 3 seconds, and slowly lower the leg.  Perform this exercise against resistance later as your  leg gets stronger.  Leg Slides: Lying on your back, slowly slide your foot toward your buttocks, bending your knee up off the floor (only go as far as is comfortable). Then slowly slide your foot back down until your leg is flat on the floor again.  Angel Wings: Lying on your back spread your legs to the side as far apart as you can without causing discomfort.  Hamstring Strength:  Lying on your back, push your heel against the floor with your leg straight by tightening up the muscles of your buttocks.  Repeat, but this time bend your knee to a comfortable angle, and push your heel against the floor.  You may put a pillow under the heel to make it more comfortable if necessary.   A rehabilitation program following joint replacement surgery can speed recovery and prevent re-injury in the future due to weakened muscles. Contact your doctor or a physical therapist for more information on knee rehabilitation.    CONSTIPATION  Constipation is defined medically as fewer than three stools per week and severe constipation as less than one stool per week.  Even if you have a regular bowel pattern at home, your normal regimen is likely to be disrupted due to multiple reasons following surgery.  Combination of anesthesia, postoperative narcotics, change in appetite and fluid intake all can affect your bowels.   YOU MUST use at least one of the following options; they are listed in order of increasing strength to get the job done.  They are all available over the counter, and you may need to use some, POSSIBLY even all of these options:    Drink plenty of fluids (prune juice may be helpful) and high fiber foods Colace 100 mg by mouth twice a day  Senokot for constipation as directed and as needed Dulcolax (bisacodyl), take with full glass of water  Miralax (polyethylene glycol) once or twice a day as needed.  If you have tried all these things and are unable to have a bowel movement in the first 3-4 days  after surgery call either your surgeon or your primary doctor.    If you experience loose stools or diarrhea, hold the medications until you stool forms back up.  If your symptoms do not get better within 1 week or if they get worse, check with your doctor.  If you experience "the worst abdominal pain ever" or develop nausea or vomiting, please contact the office immediately for further recommendations for treatment.   ITCHING:  If you experience itching with your medications, try taking only a single pain pill, or even half a pain pill at a time.  You can also use Benadryl over the counter for itching or also to help with sleep.   TED HOSE STOCKINGS:  Use stockings on both legs until for at least 2 weeks or as directed by physician office. They may be removed   at night for sleeping.  MEDICATIONS:  See your medication summary on the "After Visit Summary" that nursing will review with you.  You may have some home medications which will be placed on hold until you complete the course of blood thinner medication.  It is important for you to complete the blood thinner medication as prescribed.  PRECAUTIONS:  If you experience chest pain or shortness of breath - call 911 immediately for transfer to the hospital emergency department.   If you develop a fever greater that 101 F, purulent drainage from wound, increased redness or drainage from wound, foul odor from the wound/dressing, or calf pain - CONTACT YOUR SURGEON.                                                   FOLLOW-UP APPOINTMENTS:  If you do not already have a post-op appointment, please call the office for an appointment to be seen by your surgeon.  Guidelines for how soon to be seen are listed in your "After Visit Summary", but are typically between 1-4 weeks after surgery.  OTHER INSTRUCTIONS:   Knee Replacement:  Do not place pillow under knee, focus on keeping the knee straight while resting. CPM instructions: 0-90 degrees, 2 hours in the  morning, 2 hours in the afternoon, and 2 hours in the evening. Place foam block, curve side up under heel at all times except when in CPM or when walking.  DO NOT modify, tear, cut, or change the foam block in any way.  POST-OPERATIVE OPIOID TAPER INSTRUCTIONS: It is important to wean off of your opioid medication as soon as possible. If you do not need pain medication after your surgery it is ok to stop day one. Opioids include: Codeine, Hydrocodone(Norco, Vicodin), Oxycodone(Percocet, oxycontin) and hydromorphone amongst others.  Long term and even short term use of opiods can cause: Increased pain response Dependence Constipation Depression Respiratory depression And more.  Withdrawal symptoms can include Flu like symptoms Nausea, vomiting And more Techniques to manage these symptoms Hydrate well Eat regular healthy meals Stay active Use relaxation techniques(deep breathing, meditating, yoga) Do Not substitute Alcohol to help with tapering If you have been on opioids for less than two weeks and do not have pain than it is ok to stop all together.  Plan to wean off of opioids This plan should start within one week post op of your joint replacement. Maintain the same interval or time between taking each dose and first decrease the dose.  Cut the total daily intake of opioids by one tablet each day Next start to increase the time between doses. The last dose that should be eliminated is the evening dose.   MAKE SURE YOU:  Understand these instructions.  Get help right away if you are not doing well or get worse.    Thank you for letting us be a part of your medical care team.  It is a privilege we respect greatly.  We hope these instructions will help you stay on track for a fast and full recovery!       Information on my medicine - ELIQUIS (apixaban)  Why was Eliquis prescribed for you? Eliquis was prescribed for you to reduce the risk of blood clots forming after  orthopedic surgery.    What do You need to know about Eliquis? Take your  Eliquis TWICE DAILY - one tablet in the morning and one tablet in the evening with or without food.  It would be best to take the dose about the same time each day.  If you have difficulty swallowing the tablet whole please discuss with your pharmacist how to take the medication safely.  Take Eliquis exactly as prescribed by your doctor and DO NOT stop taking Eliquis without talking to the doctor who prescribed the medication.  Stopping without other medication to take the place of Eliquis may increase your risk of developing a clot.  After discharge, you should have regular check-up appointments with your healthcare provider that is prescribing your Eliquis.  What do you do if you miss a dose? If a dose of ELIQUIS is not taken at the scheduled time, take it as soon as possible on the same day and twice-daily administration should be resumed.  The dose should not be doubled to make up for a missed dose.  Do not take more than one tablet of ELIQUIS at the same time.  Important Safety Information A possible side effect of Eliquis is bleeding. You should call your healthcare provider right away if you experience any of the following: Bleeding from an injury or your nose that does not stop. Unusual colored urine (red or dark brown) or unusual colored stools (red or black). Unusual bruising for unknown reasons. A serious fall or if you hit your head (even if there is no bleeding).  Some medicines may interact with Eliquis and might increase your risk of bleeding or clotting while on Eliquis. To help avoid this, consult your healthcare provider or pharmacist prior to using any new prescription or non-prescription medications, including herbals, vitamins, non-steroidal anti-inflammatory drugs (NSAIDs) and supplements.  This website has more information on Eliquis (apixaban):  http://[email or web address].com/eliquis/home

## 2022-02-10 NOTE — Evaluation (Signed)
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 S/p Revision right total hip arthroplasty on 02/10/22. PMH significant for afib, clotting disorder, GERD, hypothyroidism,OA, R THA (2008) L THA (2020), and pacemaker.   Clinical Impression  Pt is POD 0 s/p right total hip revision resulting in the deficits listed below (see PT Problem List). Pt is independent at baseline without use of AD. Pt performed sit to stand and step pivot transfer to recliner chair with MIN A. Cues for safe hand placement, sequencing, and adherence to precautions throughout. Pt will have assist from husband who is a retired Engineer, drilling, and reports that can call on family, friends, neighbors if needed. Pt will benefit from skilled PT to maximize functional mobility to increase independence.         Recommendations for follow up therapy are one component of a multi-disciplinary discharge planning process, led by the attending physician.  Recommendations may be updated based on patient status, additional functional criteria and insurance authorization.  Follow Up Recommendations Home health PT      Assistance Recommended at Discharge Frequent or constant Supervision/Assistance  Patient can return home with the following  A little help with walking and/or transfers;Help with stairs or ramp for entrance;Assist for transportation;Assistance with cooking/housework;A little help with bathing/dressing/bathroom    Equipment Recommendations None recommended by PT (pt owns RW)  Recommendations for Other Services       Functional Status Assessment Patient has had a recent decline in their functional status and demonstrates the ability to make significant improvements in function in a reasonable and predictable amount of time.     Precautions /  Restrictions Precautions Precautions: Fall;Posterior Hip Precaution Booklet Issued: Yes (comment) Precaution Comments: posterior hip precautions Restrictions Weight Bearing Restrictions: Yes RLE Weight Bearing: Partial weight bearing RLE Partial Weight Bearing Percentage or Pounds: 50%      Mobility  Bed Mobility Overal bed mobility: Needs Assistance Bed Mobility: Supine to Sit     Supine to sit: Min assist, HOB elevated     General bed mobility comments: increased time, assist for R LE to EOB. Review of posterior hip precautions prior to mobility as well as 50% PWB status.    Transfers Overall transfer level: Needs assistance Equipment used: Rolling walker (2 wheels) Transfers: Sit to/from Stand, Bed to chair/wheelchair/BSC Sit to Stand: Min assist, From elevated surface   Step pivot transfers: Min assist       General transfer comment: STS from elevated surface to simulate bed height at home. PT demonstrated techinque with use of RW to assist with adherence to [facility] - Eat status. Able to perform step pivot over to RW with cues for sequencing.    Ambulation/Gait                  Stairs            Wheelchair Mobility    Modified Rankin (Stroke Patients Only)       Balance Overall balance assessment: Needs assistance Sitting-balance support: Feet supported Sitting balance-Leahy Scale: Good     Standing balance support: Bilateral upper extremity supported, During functional activity, Reliant on assistive device for balance Standing balance-Leahy Scale: Poor Standing balance comment: reliant on external support  Pertinent Vitals/Pain Pain Assessment Pain Assessment: 0-10 Pain Score: 2  Pain Location: R lateral hip Pain Descriptors / Indicators: Sore Pain Intervention(s): Limited activity within patient's tolerance, Monitored during session, Repositioned, RN gave pain meds during session, Ice applied    Home  Living Family/patient expects to be discharged to:: Private residence (split level ranch) Living Arrangements: Spouse/significant other Available Help at Discharge: Friend(s);Family;Neighbor;Available 24 hours/day Type of Home: House Home Access: Stairs to enter Entrance Stairs-Rails: None Entrance Stairs-Number of Steps: 2+3 Alternate Level Stairs-Number of Steps: 4-5 Home Layout: Multi-level (4-5 steps R rail going up. Bedroom and bathroom upstairs. Living room and kitchen on main level upon entry) Home Equipment: Rolling Walker (2 wheels);BSC/3in1;Cane - single point Additional Comments: Has home in [place] and has shower seat there.    Prior Function Prior Level of Function : Independent/Modified Independent;Driving             Mobility Comments: no AD use       Hand Dominance        Extremity/Trunk Assessment   Upper Extremity Assessment Upper Extremity Assessment: Overall WFL for tasks assessed    Lower Extremity Assessment Lower Extremity Assessment: RLE deficits/detail RLE Deficits / Details: fair quad set strength, able to perform AROM DF/PF. RLE Sensation: WNL    Cervical / Trunk Assessment Cervical / Trunk Assessment: Normal  Communication   Communication: No difficulties  Cognition Arousal/Alertness: Awake/alert Behavior During Therapy: WFL for tasks assessed/performed Overall Cognitive Status: Within Functional Limits for tasks assessed                                          General Comments      Exercises Total Joint Exercises Ankle Circles/Pumps: AROM, 20 reps, Both, Seated Quad Sets: AROM, Right, 10 reps, Seated Gluteal Sets: AROM, 10 reps, Seated   Assessment/Plan    PT Assessment Patient needs continued PT services  PT Problem List Decreased strength;Decreased range of motion;Decreased activity tolerance;Decreased balance;Decreased mobility;Decreased coordination;Decreased knowledge of use of DME;Pain       PT  Treatment Interventions DME instruction;Gait training;Stair training;Functional mobility training;Therapeutic activities;Therapeutic exercise;Balance training;Patient/family education    PT Goals (Current goals can be found in the Care Plan section)  Acute Rehab PT Goals Patient Stated Goal: get back to walking without use of walker PT Goal Formulation: With patient/family Time For Goal Achievement: 02/24/22 Potential to Achieve Goals: Good    Frequency BID     Co-evaluation               AM-PAC PT "6 Clicks" Mobility  Outcome Measure Help needed turning from your back to your side while in a flat bed without using bedrails?: A Little Help needed moving from lying on your back to sitting on the side of a flat bed without using bedrails?: A Lot Help needed moving to and from a bed to a chair (including a wheelchair)?: A Little Help needed standing up from a chair using your arms (e.g., wheelchair or bedside chair)?: A Little Help needed to walk in hospital room?: A Little Help needed climbing 3-5 steps with a railing? : A Lot 6 Click Score: 16    End of Session Equipment Utilized During Treatment: Gait belt Activity Tolerance: Patient tolerated treatment well Patient left: in chair;with call bell/phone within reach;with family/visitor present (RN ok'd pt to be up in chair with family present, reports no alarm  needed as pt alert and oriented.) Nurse Communication: Mobility status;Precautions;Weight bearing status PT Visit Diagnosis: Muscle weakness (generalized) (M62.81);Pain;Unsteadiness on feet (R26.81) Pain - Right/Left: Right Pain - part of body: Hip    Time: 7654-6503 PT Time Calculation (min) (ACUTE ONLY): 50 min   Charges:   PT Evaluation $PT Eval Low Complexity: 1 Low PT Treatments $Therapeutic Activity: 23-37 mins        Festus Barren PT, DPT  Acute Rehabilitation Services  Office [phone]   02/10/2022, 4:42 PM

## 2022-02-11 ENCOUNTER — Encounter (HOSPITAL_COMMUNITY): Payer: Self-pay | Admitting: Orthopedic Surgery

## 2022-02-11 LAB — BASIC METABOLIC PANEL
Anion gap: 8 (ref 5–15)
BUN: 15 mg/dL (ref 8–23)
CO2: 22 mmol/L (ref 22–32)
Calcium: 8.7 mg/dL — ABNORMAL LOW (ref 8.9–10.3)
Chloride: 106 mmol/L (ref 98–111)
Creatinine, Ser: 0.61 mg/dL (ref 0.44–1.00)
GFR, Estimated: >60 mL/min (ref >60)
Glucose, Bld: 182 mg/dL — ABNORMAL HIGH (ref 70–99)
Potassium: 4.1 mmol/L (ref 3.5–5.1)
Sodium: 136 mmol/L (ref 135–145)

## 2022-02-11 LAB — CBC
HCT: 30.7 % — ABNORMAL LOW (ref 36.0–46.0)
Hemoglobin: 9.7 g/dL — ABNORMAL LOW (ref 12.0–15.0)
MCH: 30.3 pg (ref 26.0–34.0)
MCHC: 31.6 g/dL (ref 30.0–36.0)
MCV: 95.9 fL (ref 80.0–100.0)
Platelets: 195 10*3/uL (ref 150–400)
RBC: 3.2 MIL/uL — ABNORMAL LOW (ref 3.87–5.11)
RDW: 13.8 % (ref 11.5–15.5)
WBC: 11 10*3/uL — ABNORMAL HIGH (ref 4.0–10.5)
nRBC: 0 % (ref 0.0–0.2)

## 2022-02-11 LAB — SURGICAL PATHOLOGY

## 2022-02-11 MED ORDER — HYDROCODONE-ACETAMINOPHEN 5-325 MG PO TABS: 1.0000 | ORAL_TABLET | ORAL | 0 refills | Status: DC | PRN

## 2022-02-11 MED ORDER — METHOCARBAMOL 500 MG PO TABS: 500.0000 mg | ORAL_TABLET | Freq: Four times a day (QID) | ORAL | 2 refills | Status: DC | PRN

## 2022-02-11 MED ORDER — SENNA 8.6 MG PO TABS: 1.0000 | ORAL_TABLET | Freq: Every day | ORAL | 0 refills | Status: AC

## 2022-02-11 MED ORDER — POLYETHYLENE GLYCOL 3350 17 G PO PACK: 17.0000 g | PACK | Freq: Two times a day (BID) | ORAL | 0 refills | Status: AC

## 2022-02-11 NOTE — Progress Notes (Signed)
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 S/p Revision right total hip arthroplasty on 02/10/22. PMH significant for afib, clotting disorder, GERD, hypothyroidism,OA, R THA (2008) L THA (2020), and pacemaker.    PT Comments    Pt is slowly progressing toward acute PT goals this session with performance of to stair training, ambulation, and LE there ex. Pt ambulated ~3f throughout session with MIN guard-supervision, no LOB observed. Pt performed stair negotiation with MIN A and cues for sequencing and safe hand placement- multiple techniques trialed. Pt and family most comfortable with ascending stairs backward and descending forward with use of RW for adherence to [facility]. Pt's husband educated on proper guarding technique following cues from therapist and able to demonstrate proper positioning. Reminders for posterior hip precautions and 50% WB status on R LE throughout. Pt will benefit from continued skilled PT to increase her independence and maximize safety with mobility- emphasis on stair training with family present.     Recommendations for follow up therapy are one component of a multi-disciplinary discharge planning process, led by the attending physician.  Recommendations may be updated based on patient status, additional functional criteria and insurance authorization.  Follow Up Recommendations  Home health PT     Assistance Recommended at Discharge Frequent or constant Supervision/Assistance  Patient can return home with the following A little help with walking and/or transfers;Help with stairs or ramp for entrance;Assist for transportation;Assistance with cooking/housework;A little help with bathing/dressing/bathroom   Equipment Recommendations  None recommended by PT (pt owns RW)     Recommendations for Other Services       Precautions / Restrictions Precautions Precautions: Fall;Posterior Hip Precaution Booklet Issued: Yes (comment) Precaution Comments: posterior hip precautions Restrictions Weight Bearing Restrictions: Yes RLE Weight Bearing: Partial weight bearing RLE Partial Weight Bearing Percentage or Pounds: 50     Mobility  Bed Mobility Overal bed mobility: Needs Assistance Bed Mobility: Supine to Sit     Supine to sit: Min assist, HOB elevated     General bed mobility comments: Pt  in bathroom pre/post session.    Transfers Overall transfer level: Needs assistance Equipment used: Rolling walker (2 wheels) Transfers: Sit to/from Stand Sit to Stand: Min assist, From elevated surface           General transfer comment: x2; increased time    Ambulation/Gait Ambulation/Gait assistance: Min guard, Supervision Gait Distance (Feet): 40 Feet Assistive device: Rolling walker (2 wheels) Gait Pattern/deviations: Step-to pattern, Decreased stride length Gait velocity: decreased     General Gait Details: Cues for increased use of UEs to assist with adherence to 50% PWB status, cues for sequencing of steps. Pt demonstratign good carryover between sessions. Improved R foot clearance   Stairs Stairs: Yes Stairs assistance: Min assist Stair Management: No rails, Step to pattern, With walker, Backwards Number of Stairs: 11 (4x2, then 3) General stair comments: RW height lowered to allow for increased leverage and COM shift over RW in oder to assist with adherence to 50% PWB with cues increased WB through UEs. Cues for sequencing "up with the good, down with the bad" Noted improved stability and comfort ascending backward and descendign forward. Reminders of posterior hip precautions throughout. Pt's spouse present throughout and educated on proper guarding position when assisting with mobiltiy. Seated rest break provided after stair negotiation.  trialed B Ues on  single railing as well for use inside of home to get to bedroom, but pt with reported difficulty maintaining 50% WB on R LE despite cues for teciqnue, so plan to negotiate steps inside of home backward when going up, and forward when going down just as she will do with stairs without railing outside of home.   Wheelchair Mobility    Modified Rankin (Stroke Patients Only)       Balance Overall balance assessment: Needs assistance Sitting-balance support: Feet supported Sitting balance-Leahy Scale: Good     Standing balance support: Bilateral upper extremity supported, During functional activity, Reliant on assistive device for balance Standing balance-Leahy Scale: Fair Standing balance comment: reliant on external support                            Cognition Arousal/Alertness: Awake/alert Behavior During Therapy: WFL for tasks assessed/performed Overall Cognitive Status: Within Functional Limits for tasks assessed                                 General Comments: tearful during session due to pain and increased frustration following stair negotiation. PT provided active listening and encouragement.        Exercises Total Joint Exercises Ankle Circles/Pumps: AROM, 20 reps, Both, Seated Quad Sets: AROM, Right, 10 reps, Seated Gluteal Sets: AROM, 10 reps, Seated Short Arc Quad: AROM, Right, 10 reps, Seated Heel Slides: AROM, Right, 10 reps, Seated (educated to do in reclined or supine position for adherence to posterir hip precautions) Hip ABduction/ADduction: AROM, Right, 10 reps, Seated Long Arc Quad: AROM, Right, 10 reps, Seated Marching in Standing:  (review and demonstration of standing exercises per HEP packet)    General Comments        Pertinent Vitals/Pain Pain Assessment Pain Assessment: 0-10 Pain Score: 8  Faces Pain Scale: Hurts even more Pain Location: R hip during stair negotiation, improved with rest Pain  Descriptors / Indicators: Sore, Tightness, Discomfort Pain Intervention(s): Monitored during session, Premedicated before session, Limited activity within patient's tolerance    Home Living                          Prior Function            PT Goals (current goals can now be found in the care plan section) Acute Rehab PT Goals Patient Stated Goal: get back to walking without use of walker PT Goal Formulation: With patient/family Time For Goal Achievement: 02/24/22 Potential to Achieve Goals: Good Progress towards PT goals: Progressing toward goals    Frequency    BID      PT Plan Current plan remains appropriate    Co-evaluation              AM-PAC PT "6 Clicks" Mobility   Outcome Measure  Help needed turning from your back to your side while in a flat bed without using bedrails?: A Little Help needed moving from lying on your back to sitting on the side of a flat bed without using bedrails?: A Little Help needed moving to and from a bed to a chair (including a wheelchair)?: A Little Help needed standing up from a chair using your arms (e.g., wheelchair or bedside chair)?: A Little Help needed to walk in hospital room?: A Little Help needed climbing 3-5 steps with a railing? : A Lot 6  Click Score: 17    End of Session Equipment Utilized During Treatment: Gait belt Activity Tolerance: Patient tolerated treatment well Patient left: with family/visitor present;with call bell/phone within reach (in bathroom, RN entered room and ok'd that pt be up in bathroom to wash with spouse. Call string within reach) Nurse Communication: Mobility status PT Visit Diagnosis: Muscle weakness (generalized) (M62.81);Pain;Unsteadiness on feet (R26.81) Pain - Right/Left: Right Pain - part of body: Hip     Time: 4799-8721 PT Time Calculation (min) (ACUTE ONLY): 60 min  Charges:  $Gait Training: 23-37 mins $Therapeutic Exercise: 8-22 mins $Therapeutic Activity: 8-22  mins                     Posey Rea, DPT  Acute Rehabilitation Services  Office [phone]   02/11/2022, 4:54 PM

## 2022-02-11 NOTE — Progress Notes (Signed)
Patient rates pain low at rest and reports that it increases with movement. Education provided throughout shift regarding pain scale and the reporting of pain for optimal pain management during post-op period. Ivan Anchors, RN 02/11/22 8:08 PM

## 2022-02-11 NOTE — Plan of Care (Signed)
Problem: Education: Goal: Knowledge of the prescribed therapeutic regimen will improve Outcome: Progressing   Problem: Activity: Goal: Ability to avoid complications of mobility impairment will improve Outcome: Progressing   Problem: Clinical Measurements: Goal: Postoperative complications will be avoided or minimized Outcome: Progressing   Problem: Pain Management: Goal: Pain level will decrease with appropriate interventions Outcome: [place], RN 02/11/22 8:05 PM

## 2022-02-11 NOTE — TOC Transition Note (Signed)
Transition of Care [facility]) - CM/SW Discharge Note  Patient Details  Name: Kristin [name] MRN: [id number] Date of Birth: 1950-09-09  [facility] CM/SW Contact:  Sherie Don, LCSW Phone Number: 02/11/2022, 10:28 AM  Clinical Narrative: Patient is expected to discharge home after working with PT. CSW met with patient to confirm discharge plan. Patient will go home with a home exercise program (HEP). Patient has a rolling walker, cane, and raised toilet seat at home, so there are no DME needs at this time. TOC signing off.    Final next level of care: Home/Self Care Barriers to Discharge: No Barriers Identified  Patient Goals and CMS Choice Choice offered to / list presented to : NA  Discharge Plan and Services Additional resources added to the After Visit Summary for          DME Arranged: N/A DME Agency: NA  Social Determinants of Health (SDOH) Interventions SDOH Screenings   Food Insecurity: No Food Insecurity (02/10/2022)  Housing: Low Risk  (02/10/2022)  Transportation Needs: No Transportation Needs (02/10/2022)  Utilities: Not At Risk (02/10/2022)  Depression (PHQ2-9): Low Risk  (01/03/2022)  Financial Resource Strain: Low Risk  (09/28/2021)  Physical Activity: Insufficiently Active (09/28/2021)  Social Connections: Unknown (09/28/2021)  Stress: No Stress Concern Present (09/10/2020)  Tobacco Use: Low Risk  (02/10/2022)   Readmission Risk Interventions     No data to display

## 2022-02-11 NOTE — Progress Notes (Signed)
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 S/p Revision right total hip arthroplasty on 02/10/22. PMH significant for afib, clotting disorder, GERD, hypothyroidism,OA, R THA (2008) L THA (2020), and pacemaker.    PT Comments    Pt is slowly progressing toward acute PT goals this session with progression to stair training and forward ambulation. Pt ambulated ~5f, 288fwith MIN guard-supervision, no LOB observed. Pt performed stair negotiation with MOD A and cues for sequencing and safe hand placement. Pt's husband educated on proper guarding technique following cues from therapist. Reminders for posterior hip precautions and 50% WB status on R LE throughout. Pt is currently not at adequate mobility level for discharge to home with spouse assist, will benefit from continued skilled PT to increase their independence and maximize safety with mobility- emphasis on stair training with family present.     Recommendations for follow up therapy are one component of a multi-disciplinary discharge planning process, led by the attending physician.  Recommendations may be updated based on patient status, additional functional criteria and insurance authorization.  Follow Up Recommendations  Home health PT     Assistance Recommended at Discharge Frequent or constant Supervision/Assistance  Patient can return home with the following A little help with walking and/or transfers;Help with stairs or ramp for entrance;Assist for transportation;Assistance with cooking/housework;A little help with bathing/dressing/bathroom   Equipment Recommendations  None recommended by PT (pt owns RW)    Recommendations for Other Services       Precautions / Restrictions Precautions Precautions: Fall;Posterior Hip Precaution Booklet  Issued: Yes (comment) Precaution Comments: posterior hip precautions Restrictions Weight Bearing Restrictions: Yes RLE Weight Bearing: Partial weight bearing RLE Partial Weight Bearing Percentage or Pounds: 50     Mobility  Bed Mobility Overal bed mobility: Needs Assistance Bed Mobility: Supine to Sit     Supine to sit: Min assist, HOB elevated     General bed mobility comments: increased time, some assist for R LE to EOB. Review of posterior hip precautions prior to mobility as well as 50% PWB status.    Transfers Overall transfer level: Needs assistance Equipment used: Rolling walker (2 wheels) Transfers: Sit to/from Stand Sit to Stand: Min assist, From elevated surface           General transfer comment: x2; STS from elevated surface to simulate bed height at home and from [facility] toilet- pt has BSC at home. PT demonstrated techinque with use of RW to assist with adherence to PWTexas Precision Surgery Center LLCtatus. Able to perform step pivot over to RW with cues for sequencing.    Ambulation/Gait Ambulation/Gait assistance: Min guard, Supervision Gait Distance (Feet): 14 Feet Assistive device: Rolling walker (2 wheels) Gait Pattern/deviations: Step-to pattern, Decreased stride length Gait velocity: decreased     General Gait Details: additional 2070following toileting. Cues for increased use of UEs to assist with adherence to 50% PWB status, cues for sequencing of steps. Improved R foot clearance with increasing distance.   Stairs Stairs: Yes Stairs assistance: Mod assist Stair Management: No rails, Step to pattern, Forwards, With walker Number of Stairs: 5 General stair comments: RW height lowered to allow for increased leverage and COM shift over RW in oder to assist with adherence to 50% PWB with cues increased WB through UEs. Cues for sequencing "up with the good, down with the bad" increased time to  bring R LE onto step due to limitations with active flexion of knee and hip. Reminders  of posterior hip precautions throughout. Pt's spouse present throughout and educated on proper guarding position when assisting with mobiltiy. Seated rest break provided after stair negotiation- pt tearful following. Discussed alternate stair negoitation techniques. review of performance of ankle pumps, quad sets, limited ROM in recliner or supine position heel slides (adherence to posterior hip precautions) and glute squeezes.   Wheelchair Mobility    Modified Rankin (Stroke Patients Only)       Balance Overall balance assessment: Needs assistance Sitting-balance support: Feet supported Sitting balance-Leahy Scale: Good     Standing balance support: Bilateral upper extremity supported, During functional activity, Reliant on assistive device for balance Standing balance-Leahy Scale: Poor Standing balance comment: reliant on external support                            Cognition Arousal/Alertness: Awake/alert Behavior During Therapy: WFL for tasks assessed/performed Overall Cognitive Status: Within Functional Limits for tasks assessed                                 General Comments: tearful during session due to pain and increased frustration following stair negotiation. PT provided active listening and encouragement.        Exercises      General Comments        Pertinent Vitals/Pain Pain Assessment Pain Assessment: Faces Faces Pain Scale: Hurts even more Pain Location: R hip Pain Descriptors / Indicators: Sore, Tightness, Discomfort Pain Intervention(s): Limited activity within patient's tolerance, Monitored during session, Premedicated before session, Repositioned, Ice applied    Home Living                          Prior Function            PT Goals (current goals can now be found in the care plan section) Acute Rehab PT Goals Patient Stated Goal: get back to walking without use of walker PT Goal Formulation: With  patient/family Time For Goal Achievement: 02/24/22 Potential to Achieve Goals: Good Progress towards PT goals: Progressing toward goals    Frequency    BID      PT Plan Current plan remains appropriate    Co-evaluation              AM-PAC PT "6 Clicks" Mobility   Outcome Measure  Help needed turning from your back to your side while in a flat bed without using bedrails?: A Little Help needed moving from lying on your back to sitting on the side of a flat bed without using bedrails?: A Little Help needed moving to and from a bed to a chair (including a wheelchair)?: A Little Help needed standing up from a chair using your arms (e.g., wheelchair or bedside chair)?: A Little Help needed to walk in hospital room?: A Little Help needed climbing 3-5 steps with a railing? : A Lot 6 Click Score: 17    End of Session Equipment Utilized During Treatment: Gait belt Activity Tolerance: Patient tolerated treatment well Patient left: in chair;with family/visitor present;with call bell/phone within reach Nurse Communication: Mobility status;Precautions PT Visit Diagnosis: Muscle weakness (generalized) (M62.81);Pain;Unsteadiness on feet (R26.81) Pain - Right/Left: Right Pain - part of body: Hip     Time: 6314-9702 PT Time Calculation (min) (ACUTE  ONLY): 58 min  Charges:  $Gait Training: 38-52 mins $Therapeutic Activity: 8-22 mins                     Festus Barren PT, DPT  Acute Rehabilitation Services  Office [phone]    02/11/2022, 1:04 PM

## 2022-02-11 NOTE — Progress Notes (Signed)
   Subjective: 1 Day Post-Op Procedure(s) (LRB): TOTAL HIP REVISION (Right) Patient reports pain as mild.   Patient seen in rounds with Dr. Alvan Dame. Patient is resting in bed on exam this morning. No acute events overnight. Foley catheter removed. Patient has not been up with PT yet.  We will start therapy today.   Objective: Vital signs in last 24 hours: Temp:  [97.7 F (36.5 C)-98 F (36.7 C)] 97.8 F (36.6 C) (01/19 0527) Pulse Rate:  [56-83] 68 (01/19 0527) Resp:  [13-18] 16 (01/19 0527) BP: (86-120)/(53-68) 108/64 (01/19 0527) SpO2:  [95 %-100 %] 97 % (01/19 0527) Weight:  [92.3 kg] 92.3 kg (01/18 1514)  Intake/Output from previous day:  Intake/Output Summary (Last 24 hours) at 02/11/2022 0759 Last data filed at 02/11/2022 0554 Gross per 24 hour  Intake 2733.06 ml  Output 2425 ml  Net 308.06 ml     Intake/Output this shift: No intake/output data recorded.  Labs: Recent Labs    02/08/22 1400 02/11/22 0341  HGB 12.6 9.7*   Recent Labs    02/08/22 1400 02/11/22 0341  WBC 5.7 11.0*  RBC 4.16 3.20*  HCT 40.0 30.7*  PLT 269 195   Recent Labs    02/08/22 1400 02/11/22 0341  NA 137 136  K 4.4 4.1  CL 102 106  CO2 24 22  BUN 19 15  CREATININE 0.80 0.61  GLUCOSE 96 182*  CALCIUM 9.6 8.7*   No results for input(s): "LABPT", "INR" in the last 72 hours.  Exam: General - Patient is Alert and Oriented Extremity - Neurologically intact Sensation intact distally Intact pulses distally Dorsiflexion/Plantar flexion intact Dressing - dressing C/D/I Motor Function - intact, moving foot and toes well on exam.   Past Medical History:  Diagnosis Date   AR (allergic rhinitis)    Atrial fibrillation (HCC)    Atrial flutter (HCC)    Clotting disorder (HCC)    Diverticulosis    GERD (gastroesophageal reflux disease)    Hiatal hernia    Hiatal hernia    Hypothyroidism    Migraines    Mild sleep apnea    Osteoarthritis    Osteopenia after menopause 06/14/2017    DEXA 08/2016 [facility]: T = -1.10 radius, -1.0 femur; lumbar spine elevated due to DJD; recheck 2-3 years.   Pacemaker    CHB   Paroxysmal A-fib (HCC)    PUD (peptic ulcer disease)    Sleep apnea    Thyroid nodule    Transient complete heart block 1996    Assessment/Plan: 1 Day Post-Op Procedure(s) (LRB): TOTAL HIP REVISION (Right) Principal Problem:   S/P revision of total hip  Estimated body mass index is 31.86 kg/m as calculated from the following:   Height as of this encounter: '5\' 7"'$  (1.702 m).   Weight as of this encounter: 92.3 kg. Advance diet Up with therapy D/C IV fluids  DVT Prophylaxis - Aspirin PWB 50% RLE Posterior precautions  Synovial fluid and tissue reveal 1700 WBC, which is not indicative of joint infection. No growth to date.   Hgb stable at 9.7 this AM.  Plan is to go Home after hospital stay. Plan for discharge today after meeting goals with therapy. Follow up in the office in 2 weeks.   Griffith Citron, PA-C Orthopedic Surgery [phone] 02/11/2022, 7:59 AM

## 2022-02-11 NOTE — Anesthesia Postprocedure Evaluation (Signed)
Anesthesia Post Note  Patient: Kristin [name]  Procedure(s) Performed: TOTAL HIP REVISION (Right: Hip)     Patient location during evaluation: PACU Anesthesia Type: Spinal Level of consciousness: awake and alert Pain management: pain level controlled Vital Signs Assessment: post-procedure vital signs reviewed and stable Respiratory status: spontaneous breathing, nonlabored ventilation and respiratory function stable Cardiovascular status: blood pressure returned to baseline Postop Assessment: no apparent nausea or vomiting, spinal receding, no headache and no backache Anesthetic complications: no   No notable events documented.             Marthenia Rolling

## 2022-02-12 LAB — CBC
HCT: 33.5 % — ABNORMAL LOW (ref 36.0–46.0)
Hemoglobin: 10.6 g/dL — ABNORMAL LOW (ref 12.0–15.0)
MCH: 31.1 pg (ref 26.0–34.0)
MCHC: 31.6 g/dL (ref 30.0–36.0)
MCV: 98.2 fL (ref 80.0–100.0)
Platelets: 226 10*3/uL (ref 150–400)
RBC: 3.41 MIL/uL — ABNORMAL LOW (ref 3.87–5.11)
RDW: 14.3 % (ref 11.5–15.5)
WBC: 14.2 10*3/uL — ABNORMAL HIGH (ref 4.0–10.5)
nRBC: 0 % (ref 0.0–0.2)

## 2022-02-12 NOTE — Plan of Care (Signed)
  Problem: Education: Goal: Knowledge of the prescribed therapeutic regimen will improve Outcome: Progressing   Problem: Activity: Goal: Ability to avoid complications of mobility impairment will improve Outcome: Progressing   Problem: Pain Management: Goal: Pain level will decrease with appropriate interventions Outcome: Progressing

## 2022-02-12 NOTE — Progress Notes (Signed)
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 S/p Revision right total hip arthroplasty on 02/10/22. PMH significant for afib, clotting disorder, GERD, hypothyroidism,OA, R THA (2008) L THA (2020), and pacemaker.    PT Comments    Pt seen POD2 received supine in bed motivated to participate in therapy, able to teach back 50%WB on RLE and posterior hip precautions, required cuing for "don't cross legs," otherwise remembered all precautions, handout provided. Required min guard with use of gait belt to self-assist for bed mobility, min guard for transfers and for ambulation in hallway with RW 85f. Pt completed stair training with min guard and husband providing min assist for steadying of RW, handout provided. Pt requested to practice car transfer and pt was able to complete simulated car transfer using bed with min assist and use of gait belt to self-assist RLE. Pt able to observe 50%WB on RLE and posterior hip precautions throughout all mobility with minimal cuing. All education completed and pt has no further questions. Pt has met mobility goals for safe discharge home, PT is signing off, should needs change please reconsult. Thank you for this referral.    Recommendations for follow up therapy are one component of a multi-disciplinary discharge planning process, led by the attending physician.  Recommendations may be updated based on patient status, additional functional criteria and insurance authorization.  Follow Up Recommendations  Home health PT     Assistance Recommended at Discharge Frequent or constant Supervision/Assistance  Patient can return home with the following A little help with walking and/or transfers;Help with stairs or ramp for entrance;Assist for transportation;Assistance with  cooking/housework;A little help with bathing/dressing/bathroom   Equipment Recommendations  None recommended by PT (pt owns RW)    Recommendations for Other Services       Precautions / Restrictions Precautions Precautions: Fall;Posterior Hip Precaution Booklet Issued: Yes (comment) Precaution Comments: posterior hip precautions Restrictions Weight Bearing Restrictions: Yes RLE Weight Bearing: Partial weight bearing RLE Partial Weight Bearing Percentage or Pounds: 50     Mobility  Bed Mobility Overal bed mobility: Needs Assistance Bed Mobility: Supine to Sit, Sit to Supine     Supine to sit: HOB elevated, Supervision Sit to supine: Min guard   General bed mobility comments: Min guard for safety, use of gait belt to self-assist, pt able to maintain posterior precautions throughout bed mobility    Transfers Overall transfer level: Needs assistance Equipment used: Rolling walker (2 wheels) Transfers: Sit to/from Stand Sit to Stand: From elevated surface, Min guard           General transfer comment: Min guard, multimodal cues for sequencing and hand placement, pt able to maintain 50% wb and posterior hip precautions. Pt completed transfer x4.    Ambulation/Gait Ambulation/Gait assistance: Min guard, Supervision Gait Distance (Feet): 40 Feet Assistive device: Rolling walker (2 wheels) Gait Pattern/deviations: Step-to pattern, Decreased stride length, Decreased stance time - right, Decreased step length - right Gait velocity: decreased     General Gait Details: Pt ambulated with RW and min guard, no physical assist required or overt LOB noted. Pt able to maintain good weightbearing without increasing more than 50% on RLE through cuing and using BUE.   Stairs Stairs: Yes Stairs assistance: Min guard Stair Management: No rails, Step to pattern, With walker, Backwards Number of Stairs: 4 General stair comments: RW height  lowered to allow for increased leverage and  COM shift over RW in oder to assist with adherence to 50% PWB with cues increased WB through UEs. Husband providing min assist of steadying RW, PT providing min guard of patient, allowing husband to cue and providing additional verbal cues to maintain 50%WB and posterior hip precautions. Pt completed stair training without LOB. HAndout provided   Wheelchair Mobility    Modified Rankin (Stroke Patients Only)       Balance Overall balance assessment: Needs assistance Sitting-balance support: Feet supported Sitting balance-Leahy Scale: Good     Standing balance support: Bilateral upper extremity supported, During functional activity, Reliant on assistive device for balance Standing balance-Leahy Scale: Fair Standing balance comment: reliant on external support                            Cognition Arousal/Alertness: Awake/alert Behavior During Therapy: WFL for tasks assessed/performed Overall Cognitive Status: Within Functional Limits for tasks assessed                                          Exercises Other Exercises Other Exercises: Simulated car transfer using gait belt onto and off bed with min assist    General Comments        Pertinent Vitals/Pain Pain Assessment Pain Assessment: 0-10 Pain Score: 5  Pain Location: R hip during stair negotiation, improved with rest Pain Descriptors / Indicators: Sore, Tightness, Discomfort, Operative site guarding Pain Intervention(s): Limited activity within patient's tolerance, Monitored during session, Repositioned, Patient requesting pain meds-RN notified    Home Living                          Prior Function            PT Goals (current goals can now be found in the care plan section) Acute Rehab PT Goals Patient Stated Goal: get back to walking without use of walker PT Goal Formulation: With patient/family Time For Goal Achievement: 02/24/22 Potential to Achieve Goals:  Good Progress towards PT goals: Goals met/education completed, patient discharged from PT    Frequency    BID      PT Plan Current plan remains appropriate    Co-evaluation              AM-PAC PT "6 Clicks" Mobility   Outcome Measure  Help needed turning from your back to your side while in a flat bed without using bedrails?: None Help needed moving from lying on your back to sitting on the side of a flat bed without using bedrails?: A Little Help needed moving to and from a bed to a chair (including a wheelchair)?: A Little Help needed standing up from a chair using your arms (e.g., wheelchair or bedside chair)?: A Little Help needed to walk in hospital room?: A Little Help needed climbing 3-5 steps with a railing? : A Little 6 Click Score: 19    End of Session Equipment Utilized During Treatment: Gait belt Activity Tolerance: Patient tolerated treatment well Patient left: with family/visitor present;with call bell/phone within reach;in chair Nurse Communication: Mobility status PT Visit Diagnosis: Muscle weakness (generalized) (M62.81);Pain;Unsteadiness on feet (R26.81) Pain - Right/Left: Right Pain - part of body: Hip     Time: 7062-3762 PT Time Calculation (min) (ACUTE ONLY): 32 min  Charges:  $  Gait Training: 23-37 mins                     Coolidge Breeze, [place], DPT [facility] Department Office: [phone] Weekend pager: [phone]   Coolidge Breeze 02/12/2022, 11:47 AM

## 2022-02-12 NOTE — Progress Notes (Signed)
Patient ID: Kristin [name], female   DOB: [birth year], 72 y.o.   MRN: [id number] Subjective: 2 Days Post-Op Procedure(s) (LRB): TOTAL HIP REVISION (Right)    Patient reports pain as mild to moderate.  She recognizes improvement from yesterday to today and an improvement with activity with PT from session one to two.  Feels like she will be able to go home today.  Objective:   VITALS:   Vitals:   02/11/22 2136 02/12/22 0424  BP: 113/60 132/70  Pulse: 67 74  Resp: 16 16  Temp: 98.8 F (37.1 C) 98 F (36.7 C)  SpO2: 98% 98%    Neurovascular intact Incision: dressing C/D/I  LABS Recent Labs    02/11/22 0341 02/12/22 0428  HGB 9.7* 10.6*  HCT 30.7* 33.5*  WBC 11.0* 14.2*  PLT 195 226    Recent Labs    02/11/22 0341  NA 136  K 4.1  BUN 15  CREATININE 0.61  GLUCOSE 182*    No results for input(s): "LABPT", "INR" in the last 72 hours.   Assessment/Plan: 2 Days Post-Op Procedure(s) (LRB): TOTAL HIP REVISION (Right)   Up with therapy Likely home today after therapy RTC in 2 weeks Reviewed questions today

## 2022-02-14 ENCOUNTER — Telehealth: Payer: Self-pay

## 2022-02-14 NOTE — Patient Outreach (Signed)
  Care Coordination TOC Note Transition Care Management Follow-up Telephone Call Date of discharge and from where: 02/12/22-[facility]  DX: "s/p revision total hip" How have you been since you were released from the hospital? Voicemail message received form patient. Return call placed to patient. She voices that she is doing okay. She states that her "pain has been worse since returning home" but she attributes it to her being up and moving around more than she did while inpatient. She is staying on top of pain and adhering to pain regimen and reports meds helping/working. She is taking bowel regimen meds to manage constipation and reports she has had a BM since returning home. Appetite has been fair.  Any questions or concerns? No  Items Reviewed: Did the pt receive and understand the discharge instructions provided? Yes  Medications obtained and verified? Yes  Other? No  Any new allergies since your discharge? No  Dietary orders reviewed? Yes Do you have support at home? Yes   Home Care and Equipment/Supplies: Were home health services ordered? not applicable If so, what is the name of the agency? N/A  Has the agency set up a time to come to the patient's home? not applicable Were any new equipment or medical supplies ordered?  No What is the name of the medical supply agency? N/A Were you able to get the supplies/equipment? not applicable Do you have any questions related to the use of the equipment or supplies? No  Functional Questionnaire: (I = Independent and D = Dependent) ADLs: I  Bathing/Dressing- I  Meal Prep- I  Eating- I  Maintaining continence- I  Transferring/Ambulation- I  Managing Meds- I  Follow up appointments reviewed:  PCP Hospital f/u appt confirmed? No   Specialist Hospital f/u appt confirmed? Yes  Scheduled to see Dr. Alvan Dame on 02/24/22. Are transportation arrangements needed? No  If their condition worsens, is the pt aware to call PCP or go to  the Emergency Dept.? Yes Was the patient provided with contact information for the PCP's office or ED? Yes Was to pt encouraged to call back with questions or concerns? Yes  SDOH assessments and interventions completed:   Yes SDOH Interventions Today    Flowsheet Row Most Recent Value  SDOH Interventions   Food Insecurity Interventions Intervention Not Indicated  Transportation Interventions Intervention Not Indicated       Care Coordination Interventions:  Education provided on post op care, pain mgmt and bowel regimen    Encounter Outcome:  Pt. Visit Completed    Enzo Montgomery, RN,BSN,CCM [place] Management Telephonic Care Management Coordinator Direct Phone: [phone] Toll Free: [phone] Fax: [phone]

## 2022-02-14 NOTE — Patient Outreach (Signed)
  Care Coordination TOC Note Transition Care Management Unsuccessful Follow-up Telephone Call  Date of discharge and from where:  02/12/22-[facility]  Attempts:  1st Attempt  Reason for unsuccessful TCM follow-up call:  Left voice message     Hetty Blend [place] Management Telephonic Care Management Coordinator Direct Phone: [phone] Toll Free: [phone] Fax: [phone]

## 2022-02-15 LAB — AEROBIC/ANAEROBIC CULTURE W GRAM STAIN (SURGICAL/DEEP WOUND)
Culture: NO GROWTH
Culture: NO GROWTH
Gram Stain: NONE SEEN

## 2022-02-24 DIAGNOSIS — Z5189 Encounter for other specified aftercare: Secondary | ICD-10-CM | POA: Diagnosis not present

## 2022-02-24 NOTE — Discharge Summary (Signed)
Patient ID: Kristin [name] MRN: [id number] DOB/AGE: 71-Oct-1952 71 y.o.  Admit date: 02/10/2022 Discharge date: 02/12/2022  Admission Diagnoses:  Failed right total hip arthroplasty   Discharge Diagnoses:  Principal Problem:   S/P revision of total hip   Past Medical History:  Diagnosis Date   AR (allergic rhinitis)    Atrial fibrillation (HCC)    Atrial flutter (HCC)    Clotting disorder (HCC)    Diverticulosis    GERD (gastroesophageal reflux disease)    Hiatal hernia    Hiatal hernia    Hypothyroidism    Migraines    Mild sleep apnea    Osteoarthritis    Osteopenia after menopause 06/14/2017   DEXA 08/2016 [facility]: T = -1.10 radius, -1.0 femur; lumbar spine elevated due to DJD; recheck 2-3 years.   Pacemaker    CHB   Paroxysmal A-fib ([place])    PUD (peptic ulcer disease)    Sleep apnea    Thyroid nodule    Transient complete heart block 1996    Surgeries: Procedure(s): TOTAL HIP REVISION on 02/10/2022   Consultants:   Discharged Condition: Improved  Hospital Course: Kristin [name] is an 72 y.o. female who was admitted 02/10/2022 for operative treatment ofS/P revision of total hip. Patient has severe unremitting pain that affects sleep, daily activities, and work/hobbies. After pre-op clearance the patient was taken to the operating room on 02/10/2022 and underwent  Procedure(s): TOTAL HIP REVISION.    Patient was given perioperative antibiotics:  Anti-infectives (From admission, onward)    Start     Dose/Rate Route Frequency Ordered Stop   02/10/22 1400  ceFAZolin (ANCEF) IVPB 2g/100 mL premix        2 g 200 mL/hr over 30 Minutes Intravenous Every 6 hours 02/10/22 1356 02/10/22 2235   02/10/22 0600  ceFAZolin (ANCEF) IVPB 2g/100 mL premix        2 g 200 mL/hr over 30 Minutes Intravenous On call to O.R. 02/10/22 0530 02/10/22 0740        Patient was given sequential compression devices, early ambulation, and chemoprophylaxis to prevent DVT. Patient worked with PT  and was meeting their goals regarding safe ambulation and transfers.  Patient benefited maximally from hospital stay and there were no complications.    Recent vital signs: No data found.   Recent laboratory studies: No results for input(s): "WBC", "HGB", "HCT", "PLT", "NA", "K", "CL", "CO2", "BUN", "CREATININE", "GLUCOSE", "INR", "CALCIUM" in the last 72 hours.  Invalid input(s): "PT", "2"   Discharge Medications:   Allergies as of 02/12/2022       Reactions   Morphine And Related Hives, Itching   Neomycin Itching   Adhesive [tape] Rash, Other (See Comments)   "Blistering rash"        Medication List     STOP taking these medications    acetaminophen 500 MG tablet Commonly known as: TYLENOL   erythromycin ophthalmic ointment   traMADol 50 MG tablet Commonly known as: ULTRAM       TAKE these medications    amoxicillin 500 MG capsule Commonly known as: AMOXIL Take by mouth as directed. Take 4 caps by mouth 1 hour prior to dental procedures   ascorbic acid 500 MG tablet Commonly known as: VITAMIN C Take 500 mg by mouth daily.   B12 LIQUID HEALTH BOOSTER PO Take 1,000 mcg by mouth every Friday.   CALCIUM 500 PO Take 500 mg by mouth daily.   diltiazem 120 MG 24 hr capsule Commonly  known as: CARDIZEM CD TAKE ONE CAPSULE TWICE DAILY. MAY TAKE EXTRA CAPSULE DAILY FOR BREAKTHROUGH AFIB.   dofetilide 500 MCG capsule Commonly known as: TIKOSYN TAKE ONE CAPSULE TWICE DAILY   Eliquis 5 MG Tabs tablet Generic drug: apixaban TAKE 1 TABLET TWICE DAILY   ferrous sulfate 325 (65 FE) MG EC tablet Take 325 mg by mouth daily.   HYDROcodone-acetaminophen 5-325 MG tablet Commonly known as: NORCO/VICODIN Take 1 tablet by mouth every 4 (four) hours as needed for severe pain.   levothyroxine 112 MCG tablet Commonly known as: SYNTHROID TAKE ONE TABLET BY MOUTH DAILY   Magnesium 250 MG Tabs Take 250 mg by mouth every evening.   methocarbamol 500 MG  tablet Commonly known as: ROBAXIN Take 1 tablet (500 mg total) by mouth every 6 (six) hours as needed for muscle spasms (muscle pain).   omeprazole 20 MG capsule Commonly known as: PRILOSEC Take 1 capsule (20 mg total) by mouth daily.   polyethylene glycol 17 g packet Commonly known as: MIRALAX / GLYCOLAX Take 17 g by mouth 2 (two) times daily.   Potassium 99 MG Tabs Take 99 mg by mouth daily.   senna 8.6 MG Tabs tablet Commonly known as: SENOKOT Take 1 tablet (8.6 mg total) by mouth at bedtime for 14 days.   sodium chloride 0.65 % Soln nasal spray Commonly known as: OCEAN Place 1 spray into both nostrils 2 (two) times daily.   Vitamin D3 25 MCG (1000 UT) Caps Take 1,000 Units by mouth daily.               Discharge Care Instructions  (From admission, onward)           Start     Ordered   02/11/22 0000  Change dressing       Comments: Maintain surgical dressing until follow up in the clinic. If the edges start to pull up, may reinforce with tape. If the dressing is no longer working, may remove and cover with gauze and tape, but must keep the area dry and clean.  Call with any questions or concerns.   02/11/22 0804            Diagnostic Studies: DG Pelvis Portable  Result Date: 02/10/2022 CLINICAL DATA:  Status post total hip arthroplasty EXAM: PORTABLE PELVIS 1-2 VIEWS COMPARISON:  CT 09/30/2021 FINDINGS: Postsurgical changes of right total hip arthroplasty. No normal alignment without evidence of fracture. Expected soft tissue changes. Unchanged left hip arthroplasty. IMPRESSION: Postsurgical changes of right total hip arthroplasty. No evidence of immediate hardware complication. Electronically Signed   By: Maurine Simmering M.D.   On: 02/10/2022 11:18    Disposition: Discharge disposition: 01-Home or Self Care       Discharge Instructions     Call MD / Call 911   Complete by: As directed    If you experience chest pain or shortness of breath, CALL 911  and be transported to the hospital emergency room.  If you develope a fever above 101 F, pus (white drainage) or increased drainage or redness at the wound, or calf pain, call your surgeon's office.   Change dressing   Complete by: As directed    Maintain surgical dressing until follow up in the clinic. If the edges start to pull up, may reinforce with tape. If the dressing is no longer working, may remove and cover with gauze and tape, but must keep the area dry and clean.  Call with any questions or concerns.  Constipation Prevention   Complete by: As directed    Drink plenty of fluids.  Prune juice may be helpful.  You may use a stool softener, such as Colace (over the counter) 100 mg twice a day.  Use MiraLax (over the counter) for constipation as needed.   Diet - low sodium heart healthy   Complete by: As directed    Increase activity slowly as tolerated   Complete by: As directed    Partial Weight bearing   Post-operative opioid taper instructions:   Complete by: As directed    POST-OPERATIVE OPIOID TAPER INSTRUCTIONS: It is important to wean off of your opioid medication as soon as possible. If you do not need pain medication after your surgery it is ok to stop day one. Opioids include: Codeine, Hydrocodone(Norco, Vicodin), Oxycodone(Percocet, oxycontin) and hydromorphone amongst others.  Long term and even short term use of opiods can cause: Increased pain response Dependence Constipation Depression Respiratory depression And more.  Withdrawal symptoms can include Flu like symptoms Nausea, vomiting And more Techniques to manage these symptoms Hydrate well Eat regular healthy meals Stay active Use relaxation techniques(deep breathing, meditating, yoga) Do Not substitute Alcohol to help with tapering If you have been on opioids for less than two weeks and do not have pain than it is ok to stop all together.  Plan to wean off of opioids This plan should start within one  week post op of your joint replacement. Maintain the same interval or time between taking each dose and first decrease the dose.  Cut the total daily intake of opioids by one tablet each day Next start to increase the time between doses. The last dose that should be eliminated is the evening dose.      TED hose   Complete by: As directed    Use stockings (TED hose) for 2 weeks on both leg(s).  You may remove them at night for sleeping.        Follow-up Information     Paralee Cancel, MD. Schedule an appointment as soon as possible for a visit in 2 week(s).   Specialty: Orthopedic Surgery Contact information: [address] [place] [address] [phone]                  Signed: Irving Copas 02/24/2022, 7:15 AM

## 2022-03-03 ENCOUNTER — Encounter (HOSPITAL_COMMUNITY): Payer: Self-pay | Admitting: *Deleted

## 2022-03-11 ENCOUNTER — Ambulatory Visit (INDEPENDENT_AMBULATORY_CARE_PROVIDER_SITE_OTHER): Payer: Medicare PPO

## 2022-03-11 DIAGNOSIS — I442 Atrioventricular block, complete: Secondary | ICD-10-CM

## 2022-03-11 LAB — CUP PACEART REMOTE DEVICE CHECK
Battery Remaining Longevity: 165 mo
Battery Voltage: 3.06 V
Brady Statistic AP VP Percent: 0 %
Brady Statistic AP VS Percent: 0 %
Brady Statistic AS VP Percent: 0.03 %
Brady Statistic AS VS Percent: 99.97 %
Brady Statistic RA Percent Paced: 0 %
Brady Statistic RV Percent Paced: 0.03 %
Date Time Interrogation Session: 20240216002150
Implantable Lead Connection Status: 753985
Implantable Lead Connection Status: 753985
Implantable Lead Implant Date: 19960815
Implantable Lead Implant Date: 19960815
Implantable Lead Location: 753859
Implantable Lead Location: 753860
Implantable Lead Model: 5024
Implantable Pulse Generator Implant Date: 20220512
Lead Channel Impedance Value: 323 Ohm
Lead Channel Impedance Value: 323 Ohm
Lead Channel Impedance Value: 399 Ohm
Lead Channel Impedance Value: 532 Ohm
Lead Channel Pacing Threshold Amplitude: 1.625 V
Lead Channel Pacing Threshold Pulse Width: 0.4 ms
Lead Channel Sensing Intrinsic Amplitude: 2.875 mV
Lead Channel Sensing Intrinsic Amplitude: 5.25 mV
Lead Channel Sensing Intrinsic Amplitude: 5.25 mV
Lead Channel Setting Pacing Amplitude: 3.25 V
Lead Channel Setting Pacing Pulse Width: 0.4 ms
Lead Channel Setting Sensing Sensitivity: 0.9 mV
Zone Setting Status: 755011

## 2022-03-30 DIAGNOSIS — Z96642 Presence of left artificial hip joint: Secondary | ICD-10-CM | POA: Diagnosis not present

## 2022-03-30 DIAGNOSIS — Z471 Aftercare following joint replacement surgery: Secondary | ICD-10-CM | POA: Diagnosis not present

## 2022-04-05 NOTE — Progress Notes (Signed)
Remote pacemaker transmission.   

## 2022-05-12 DIAGNOSIS — Z96642 Presence of left artificial hip joint: Secondary | ICD-10-CM | POA: Diagnosis not present

## 2022-05-12 DIAGNOSIS — M1711 Unilateral primary osteoarthritis, right knee: Secondary | ICD-10-CM | POA: Diagnosis not present

## 2022-05-12 DIAGNOSIS — Z471 Aftercare following joint replacement surgery: Secondary | ICD-10-CM | POA: Diagnosis not present

## 2022-05-25 ENCOUNTER — Ambulatory Visit
Admission: RE | Admit: 2022-05-25 | Discharge: 2022-05-25 | Disposition: A | Discharge status: Home / Self Care | Payer: Medicare PPO | Source: Ambulatory Visit | Attending: Family Medicine | Admitting: Family Medicine

## 2022-05-25 DIAGNOSIS — N951 Menopausal and female climacteric states: Secondary | ICD-10-CM | POA: Diagnosis not present

## 2022-05-25 DIAGNOSIS — R29890 Loss of height: Secondary | ICD-10-CM | POA: Diagnosis not present

## 2022-05-25 DIAGNOSIS — M810 Age-related osteoporosis without current pathological fracture: Secondary | ICD-10-CM | POA: Diagnosis not present

## 2022-05-25 DIAGNOSIS — E349 Endocrine disorder, unspecified: Secondary | ICD-10-CM | POA: Diagnosis not present

## 2022-05-25 DIAGNOSIS — Z780 Asymptomatic menopausal state: Secondary | ICD-10-CM

## 2022-05-25 DIAGNOSIS — E039 Hypothyroidism, unspecified: Secondary | ICD-10-CM | POA: Diagnosis not present

## 2022-06-10 ENCOUNTER — Ambulatory Visit (INDEPENDENT_AMBULATORY_CARE_PROVIDER_SITE_OTHER): Payer: Medicare PPO

## 2022-06-10 DIAGNOSIS — I442 Atrioventricular block, complete: Secondary | ICD-10-CM | POA: Diagnosis not present

## 2022-06-10 LAB — CUP PACEART REMOTE DEVICE CHECK
Battery Remaining Longevity: 162 mo
Battery Voltage: 3.05 V
Brady Statistic AP VP Percent: 0 %
Brady Statistic AP VS Percent: 0 %
Brady Statistic AS VP Percent: 0.02 %
Brady Statistic AS VS Percent: 99.98 %
Brady Statistic RA Percent Paced: 0 %
Brady Statistic RV Percent Paced: 0.02 %
Date Time Interrogation Session: 20240517075517
Implantable Lead Connection Status: 753985
Implantable Lead Connection Status: 753985
Implantable Lead Implant Date: 19960815
Implantable Lead Implant Date: 19960815
Implantable Lead Location: 753859
Implantable Lead Location: 753860
Implantable Lead Model: 5024
Implantable Pulse Generator Implant Date: 20220512
Lead Channel Impedance Value: 323 Ohm
Lead Channel Impedance Value: 342 Ohm
Lead Channel Impedance Value: 399 Ohm
Lead Channel Impedance Value: 475 Ohm
Lead Channel Pacing Threshold Amplitude: 1.5 V
Lead Channel Pacing Threshold Pulse Width: 0.4 ms
Lead Channel Sensing Intrinsic Amplitude: 2.875 mV
Lead Channel Sensing Intrinsic Amplitude: 6.125 mV
Lead Channel Sensing Intrinsic Amplitude: 6.125 mV
Lead Channel Setting Pacing Amplitude: 3 V
Lead Channel Setting Pacing Pulse Width: 0.4 ms
Lead Channel Setting Sensing Sensitivity: 0.9 mV
Zone Setting Status: 755011

## 2022-06-22 NOTE — Progress Notes (Signed)
Remote pacemaker transmission.   

## 2022-08-16 ENCOUNTER — Other Ambulatory Visit: Payer: Self-pay

## 2022-08-16 DIAGNOSIS — I480 Paroxysmal atrial fibrillation: Secondary | ICD-10-CM

## 2022-08-16 MED ORDER — APIXABAN 5 MG PO TABS: 5.0000 mg | ORAL_TABLET | Freq: Two times a day (BID) | ORAL | 1 refills | Status: DC

## 2022-08-16 NOTE — Telephone Encounter (Signed)
Prescription refill request for Eliquis received. Indication: Afib  Last office visit: 09/30/21 Ladona Ridgel)  Scr: 0.61 (02/11/22)  Age: 72 Weight: 92.3kg  Appropriate dose. Refill sent.

## 2022-08-21 ENCOUNTER — Other Ambulatory Visit: Payer: Self-pay | Admitting: Family Medicine

## 2022-08-29 DIAGNOSIS — H2513 Age-related nuclear cataract, bilateral: Secondary | ICD-10-CM | POA: Diagnosis not present

## 2022-09-09 ENCOUNTER — Ambulatory Visit: Payer: Medicare PPO

## 2022-09-09 DIAGNOSIS — I442 Atrioventricular block, complete: Secondary | ICD-10-CM

## 2022-09-09 LAB — CUP PACEART REMOTE DEVICE CHECK
Battery Remaining Longevity: 159 mo
Battery Voltage: 3.04 V
Brady Statistic AP VP Percent: 0 %
Brady Statistic AP VS Percent: 0 %
Brady Statistic AS VP Percent: 0.01 %
Brady Statistic AS VS Percent: 99.99 %
Brady Statistic RA Percent Paced: 0 %
Brady Statistic RV Percent Paced: 0.01 %
Date Time Interrogation Session: 20240816010513
Implantable Lead Connection Status: 753985
Implantable Lead Connection Status: 753985
Implantable Lead Implant Date: 19960815
Implantable Lead Implant Date: 19960815
Implantable Lead Location: 753859
Implantable Lead Location: 753860
Implantable Lead Model: 5024
Implantable Pulse Generator Implant Date: 20220512
Lead Channel Impedance Value: 323 Ohm
Lead Channel Impedance Value: 323 Ohm
Lead Channel Impedance Value: 399 Ohm
Lead Channel Impedance Value: 532 Ohm
Lead Channel Pacing Threshold Amplitude: 1.375 V
Lead Channel Pacing Threshold Pulse Width: 0.4 ms
Lead Channel Sensing Intrinsic Amplitude: 2.875 mV
Lead Channel Sensing Intrinsic Amplitude: 5.75 mV
Lead Channel Sensing Intrinsic Amplitude: 5.75 mV
Lead Channel Setting Pacing Amplitude: 3.25 V
Lead Channel Setting Pacing Pulse Width: 0.4 ms
Lead Channel Setting Sensing Sensitivity: 0.9 mV
Zone Setting Status: 755011

## 2022-09-19 NOTE — Progress Notes (Signed)
Remote pacemaker transmission.   

## 2022-10-04 ENCOUNTER — Ambulatory Visit (INDEPENDENT_AMBULATORY_CARE_PROVIDER_SITE_OTHER): Payer: Medicare PPO

## 2022-10-04 VITALS — Wt 190.0 lb

## 2022-10-04 DIAGNOSIS — Z0000 Encounter for general adult medical examination without abnormal findings: Secondary | ICD-10-CM

## 2022-10-04 NOTE — Progress Notes (Signed)
Subjective:   Kristin [name] is a 72 y.o. female who presents for Medicare Annual (Subsequent) preventive examination.  Visit Complete: Virtual  I connected with  Kristin [name] on 10/04/22 by a audio enabled telemedicine application and verified that I am speaking with the correct person using two identifiers.  Patient Location: Home  Provider Location: Office/Clinic  I discussed the limitations of evaluation and management by telemedicine. The patient expressed understanding and agreed to proceed.    Vital Signs: Unable to obtain new vitals due to this being a telehealth visit.   Review of Systems     Cardiac Risk Factors include: advanced age (>52men, >42 women)     Objective:    Today's Vitals   10/04/22 1104  Weight: 190 lb (86.2 kg)   Body mass index is 29.76 kg/m.     10/04/2022   11:12 AM 02/10/2022    3:05 PM 02/10/2022    2:59 PM 02/08/2022    1:57 PM 09/28/2021   11:57 AM 09/10/2020   11:51 AM 09/05/2019   11:49 AM  Advanced Directives  Does Patient Have a Medical Advance Directive? Yes  Yes Yes Yes Yes Yes  Type of Estate agent of [place];Living will Healthcare Power of [employer] of [place];Living will Healthcare Power of [place];Living will Healthcare Power of [employer] of [place];Living will  Does patient want to make changes to medical advance directive? No - Patient declined No - Patient declined Yes (Inpatient - patient defers changing a medical advance directive at this time - Information given)  No - Patient declined    Copy of Healthcare Power of Attorney in Chart? Yes - validated most recent copy scanned in chart (See row information) Yes - validated most recent copy scanned in chart (See row information)  Yes - validated most recent copy scanned in chart (See row information) Yes - validated most recent copy scanned in chart (See row information) Yes - validated most recent copy scanned in chart  (See row information) Yes - validated most recent copy scanned in chart (See row information)    Current Medications (verified) Outpatient Encounter Medications as of 10/04/2022  Medication Sig   amoxicillin (AMOXIL) 500 MG capsule Take by mouth as directed. Take 4 caps by mouth 1 hour prior to dental procedures   apixaban (ELIQUIS) 5 MG TABS tablet Take 1 tablet (5 mg total) by mouth 2 (two) times daily.   Calcium Carbonate (CALCIUM 500 PO) Take 500 mg by mouth daily.   Cholecalciferol (VITAMIN D3) 25 MCG (1000 UT) CAPS Take 1,000 Units by mouth daily.   Cyanocobalamin (B12 LIQUID HEALTH BOOSTER PO) Take 1,000 mcg by mouth every Friday.   diltiazem (CARDIZEM CD) 120 MG 24 hr capsule TAKE ONE CAPSULE TWICE DAILY. MAY TAKE EXTRA CAPSULE DAILY FOR BREAKTHROUGH AFIB.   dofetilide (TIKOSYN) 500 MCG capsule TAKE ONE CAPSULE TWICE DAILY   levothyroxine (SYNTHROID) 112 MCG tablet TAKE ONE TABLET BY MOUTH DAILY   Magnesium 250 MG TABS Take 250 mg by mouth every evening.    omeprazole (PRILOSEC) 20 MG capsule Take 1 capsule (20 mg total) by mouth daily.   polyethylene glycol (MIRALAX / GLYCOLAX) 17 g packet Take 17 g by mouth 2 (two) times daily.   Potassium 99 MG TABS Take 99 mg by mouth daily.    sodium chloride (OCEAN) 0.65 % SOLN nasal spray Place 1 spray into both nostrils 2 (two) times daily.   vitamin C (ASCORBIC ACID)  500 MG tablet Take 500 mg by mouth daily.   [DISCONTINUED] ferrous sulfate 325 (65 FE) MG EC tablet Take 325 mg by mouth daily.   [DISCONTINUED] HYDROcodone-acetaminophen (NORCO/VICODIN) 5-325 MG tablet Take 1 tablet by mouth every 4 (four) hours as needed for severe pain.   [DISCONTINUED] methocarbamol (ROBAXIN) 500 MG tablet Take 1 tablet (500 mg total) by mouth every 6 (six) hours as needed for muscle spasms (muscle pain).   No facility-administered encounter medications on file as of 10/04/2022.    Allergies (verified) Morphine and codeine, Neomycin, and Adhesive [tape]    History: Past Medical History:  Diagnosis Date   AR (allergic rhinitis)    Atrial fibrillation (HCC)    Atrial flutter (HCC)    Clotting disorder (HCC)    Diverticulosis    GERD (gastroesophageal reflux disease)    Hiatal hernia    Hiatal hernia    Hypothyroidism    Migraines    Mild sleep apnea    Osteoarthritis    Osteopenia after menopause 06/14/2017   DEXA 08/2016 [facility]: T = -1.10 radius, -1.0 femur; lumbar spine elevated due to DJD; recheck 2-3 years.   Pacemaker    CHB   Paroxysmal A-fib (HCC)    PUD (peptic ulcer disease)    Sleep apnea    Thyroid nodule    Transient complete heart block 1996   Past Surgical History:  Procedure Laterality Date   ATRIAL FIBRILLATION ABLATION N/A 01/08/2019   Procedure: ATRIAL FIBRILLATION ABLATION;  Surgeon: Hillis Range, MD;  Location: [facility];  Service: Cardiovascular;  Laterality: N/A;   ATRIAL FIBRILLATION ABLATION N/A 04/02/2019   Procedure: ATRIAL FIBRILLATION ABLATION;  Surgeon: Hillis Range, MD;  Location: [facility]VAS[facility] CV [facility];  Service: Cardiovascular;  Laterality: N/A;   CARDIOVERSION N/A 01/28/2019   Procedure: CARDIOVERSION;  Surgeon: Little Ishikawa, MD;  Location: [facility] ENDOSCOPY;  Service: Endoscopy;  Laterality: N/A;   CARDIOVERSION N/A 02/27/2019   Procedure: CARDIOVERSION;  Surgeon: Little Ishikawa, MD;  Location: [facility] ENDOSCOPY;  Service: Endoscopy;  Laterality: N/A;   HIP ARTHROPLASTY Right 2008   PACEMAKER GENERATOR CHANGE  03/01/2007   performed by Dr Sharol Harness at [facility] ([facility])   PACEMAKER INSERTION  09/08/1994   performed in [facility] for transient complete heart block   PPM GENERATOR CHANGEOUT N/A 06/04/2020   Procedure: PPM GENERATOR CHANGEOUT;  Surgeon: Hillis Range, MD;  Location: [facility];  Service: Cardiovascular;  Laterality: N/A;   THYROIDECTOMY, PARTIAL  1986   BENIGN NODULES   TOTAL HIP ARTHROPLASTY Left 02/13/2018   Procedure: TOTAL HIP ARTHROPLASTY ANTERIOR  APPROACH;  Surgeon: Durene Romans, MD;  Location: [facility] ORS;  Service: Orthopedics;  Laterality: Left;  70 mins   TOTAL HIP REVISION Right 02/10/2022   Procedure: TOTAL HIP REVISION;  Surgeon: Durene Romans, MD;  Location: [facility] ORS;  Service: Orthopedics;  Laterality: Right;   TUBAL LIGATION  1987   Family History  Problem Relation Age of Onset   Diabetes Mellitus II Maternal Uncle    Cancer Maternal Grandmother        ENDOMETRIAL   Rheum arthritis Mother    Peptic Ulcer Mother    COPD Brother    Drug abuse Brother    Breast cancer Neg Hx    Social History   Socioeconomic History   Marital status: Married    Spouse name: Not on file   Number of children: 0   Years of education: Not on file   Highest  education level: Not on file  Occupational History   Occupation: retired  Tobacco Use   Smoking status: Never    Passive exposure: Past   Smokeless tobacco: Never  Vaping Use   Vaping status: Never Used  Substance and Sexual Activity   Alcohol use: Never    Alcohol/week: 1.0 standard drink of alcohol    Types: 1 Glasses of wine per week   Drug use: No   Sexual activity: Not Currently  Other Topics Concern   Not on file  Social History Narrative   [employer]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   Married, no children   Social Determinants of Health   Financial Resource Strain: Low Risk  (10/04/2022)   Overall Financial Resource Strain (CARDIA)    Difficulty of Paying Living Expenses: Not hard at all  Food Insecurity: No Food Insecurity (10/04/2022)   Hunger Vital Sign    Worried About Running Out of Food in the Last Year: Never true    Ran Out of Food in the Last Year: Never true  Transportation Needs: No Transportation Needs (10/04/2022)   PRAPARE - Administrator, Civil Service (Medical): No    Lack of Transportation (Non-Medical): No  Physical Activity: Sufficiently Active (10/04/2022)   Exercise Vital Sign    Days of Exercise per Week: 5 days    Minutes of  Exercise per Session: 90 min  Stress: No Stress Concern Present (10/04/2022)   [employer] of Occupational Health - Occupational Stress Questionnaire    Feeling of Stress : Not at all  Social Connections: Moderately Integrated (10/04/2022)   Social Connection and Isolation Panel [NHANES]    Frequency of Communication with Friends and Family: More than three times a week    Frequency of Social Gatherings with Friends and Family: Once a week    Attends Religious Services: Never    Database administrator or Organizations: Yes    Attends Engineer, structural: 1 to 4 times per year    Marital Status: Married    Tobacco Counseling Counseling given: Not Answered   Clinical Intake:  Pre-visit preparation completed: Yes  Pain : No/denies pain     BMI - recorded: 29.76 Nutritional Status: BMI 25 -29 Overweight Nutritional Risks: None Diabetes: No  How often do you need to have someone help you when you read instructions, pamphlets, or other written materials from your doctor or pharmacy?: 1 - Never  Interpreter Needed?: No  Information entered by :: Lanier Ensign, LPN   Activities of Daily Living    10/04/2022   11:06 AM 02/10/2022    3:01 PM  In your present state of health, do you have any difficulty performing the following activities:  Hearing? 0   Vision? 0   Difficulty concentrating or making decisions? 0   Walking or climbing stairs? 0   Dressing or bathing? 0   Doing errands, shopping? 0 0  Preparing Food and eating ? N   Using the Toilet? N   In the past six months, have you accidently leaked urine? N   Do you have problems with loss of bowel control? N   Managing your Medications? N   Managing your Finances? N   Housekeeping or managing your Housekeeping? N     Patient Care Team: Willow Ora, MD as PCP - General (Family Medicine) Hillis Range, MD (Inactive) as PCP - Cardiology (Cardiology) Anson Fret, MD as Consulting Physician  (Neurology) Durene Romans,  MD as Consulting Physician (Orthopedic Surgery) Hillis Range, MD (Inactive) as Consulting Physician (Cardiology) Stanton Kidney, MD as Consulting Physician (Gastroenterology)  Indicate any recent Medical Services you may have received from other than [facility] providers in the past year (date may be approximate).     Assessment:   This is a routine wellness examination for Kristin [name].  Hearing/Vision screen Hearing Screening - Comments:: Pt denies any hearing issues  Vision Screening - Comments:: Pt follows up with [facility] for annual eye exams    Goals Addressed             This Visit's Progress    Patient Stated       Lose weight and be more active        Depression Screen    10/04/2022   11:11 AM 01/03/2022    2:02 PM 11/17/2021    1:05 PM 09/28/2021   11:46 AM 09/10/2020   11:50 AM 08/13/2020    9:11 AM 11/14/2019    9:32 AM  PHQ 2/9 Scores  PHQ - 2 Score 0 0 0 0 0 0 0    Fall Risk    10/04/2022   11:13 AM 01/03/2022    2:02 PM 11/17/2021    1:05 PM 09/28/2021   11:46 AM 09/10/2020   11:52 AM  Fall Risk   Falls in the past year? 0 0 0 0 0  Number falls in past yr: 0 0 0 0 0  Injury with Fall? 0 0 0  0  Risk for fall due to : Impaired vision No Fall Risks No Fall Risks  Impaired vision  Follow up Falls prevention discussed Falls evaluation completed Falls evaluation completed Falls evaluation completed Falls prevention discussed    MEDICARE RISK AT HOME: Medicare Risk at Home Any stairs in or around the home?: Yes If so, are there any without handrails?: No Home free of loose throw rugs in walkways, pet beds, electrical cords, etc?: Yes Adequate lighting in your home to reduce risk of falls?: Yes Life alert?: No Use of a cane, walker or w/c?: No Grab bars in the bathroom?: Yes Shower chair or bench in shower?: Yes Elevated toilet seat or a handicapped toilet?: No  TIMED UP AND GO:  Was the test performed?  No    Cognitive  Function:        10/04/2022   11:13 AM 09/28/2021   11:47 AM 09/10/2020   11:55 AM  6CIT Screen  What Year? 0 points 0 points 0 points  What month? 0 points 0 points 0 points  What time? 0 points 0 points 0 points  Count back from 20 0 points 0 points 0 points  Months in reverse 0 points 0 points 0 points  Repeat phrase 0 points 0 points 0 points  Total Score 0 points 0 points 0 points    Immunizations Immunization History  Administered Date(s) Administered   Fluad Quad(high Dose 65+) 11/01/2016, 10/08/2018, 11/14/2019, 11/16/2020, 11/17/2021   Fluad Trivalent(High Dose 65+) 01/08/2013   Hepatitis B, ADULT 11/24/2009   Hepatitis B, PED/ADOLESCENT 11/24/2009   Influenza, High Dose Seasonal PF 11/01/2016, 10/20/2017   Influenza, Quadrivalent, Recombinant, Inj, Pf 01/08/2013   Influenza, Seasonal, Injecte, Preservative Fre 11/11/2014   Influenza,inj,Quad PF,6+ Mos 12/24/2015   Influenza-Unspecified 11/01/2016   Moderna Sars-Covid-2 Vaccination 02/18/2019, 03/18/2019, 12/30/2019   Pneumococcal Conjugate-13 08/17/2016   Pneumococcal Polysaccharide-23 10/20/2017   Tdap 09/07/2010, 09/07/2010, 07/01/2013   Zoster Recombinant(Shingrix) 07/26/2017, 11/20/2017   Zoster, Live 01/08/2013  TDAP status: Up to date  Flu Vaccine status: Due, Education has been provided regarding the importance of this vaccine. Advised may receive this vaccine at local pharmacy or Health Dept. Aware to provide a copy of the vaccination record if obtained from local pharmacy or Health Dept. Verbalized acceptance and understanding.  Pneumococcal vaccine status: Up to date  Covid-19 vaccine status: Information provided on how to obtain vaccines.   Qualifies for Shingles Vaccine? Yes   Zostavax completed Yes   Shingrix Completed?: Yes  Screening Tests Health Maintenance  Topic Date Due   INFLUENZA VACCINE  08/25/2022   COVID-19 Vaccine (4 - 2023-24 season) 09/25/2022   MAMMOGRAM  11/25/2022    DTaP/Tdap/Td (4 - Td or Tdap) 07/02/2023   Medicare Annual Wellness (AWV)  10/04/2023   DEXA SCAN  05/24/2025   Colonoscopy  12/28/2025   Pneumonia Vaccine 28+ Years old  Completed   Hepatitis C Screening  Completed   Zoster Vaccines- Shingrix  Completed   HPV VACCINES  Aged Out    Health Maintenance  Health Maintenance Due  Topic Date Due   INFLUENZA VACCINE  08/25/2022   COVID-19 Vaccine (4 - 2023-24 season) 09/25/2022    Colorectal cancer screening: Type of screening: Colonoscopy. Completed 12/29/15. Repeat every 10 years  Mammogram status: Completed 11/24/21. Repeat every year  Bone Density status: Completed 05/25/22. Results reflect: Bone density results: OSTEOPENIA. Repeat every 3 years.  Additional Screening:  Hepatitis C Screening:  Completed 07/31/15  Vision Screening: Recommended annual ophthalmology exams for early detection of glaucoma and other disorders of the eye. Is the patient up to date with their annual eye exam?  Yes  Who is the provider or what is the name of the office in which the patient attends annual eye exams? [facility]  If pt is not established with a provider, would they like to be referred to a provider to establish care? No .   Dental Screening: Recommended annual dental exams for proper oral hygiene   Community Resource Referral / Chronic Care Management: CRR required this visit?  No   CCM required this visit?  No     Plan:     I have personally reviewed and noted the following in the patient's chart:   Medical and social history Use of alcohol, tobacco or illicit drugs  Current medications and supplements including opioid prescriptions. Patient is not currently taking opioid prescriptions. Functional ability and status Nutritional status Physical activity Advanced directives List of other physicians Hospitalizations, surgeries, and ER visits in previous 12 months Vitals Screenings to include cognitive, depression, and  falls Referrals and appointments  In addition, I have reviewed and discussed with patient certain preventive protocols, quality metrics, and best practice recommendations. A written personalized care plan for preventive services as well as general preventive health recommendations were provided to patient.     Marzella Schlein, LPN   03/24/6008   After Visit Summary: (MyChart) Due to this being a telephonic visit, the after visit summary with patients personalized plan was offered to patient via MyChart   Nurse Notes: none

## 2022-10-04 NOTE — Patient Instructions (Signed)
Kristin [name] , Thank you for taking time to come for your Medicare Wellness Visit. I appreciate your ongoing commitment to your health goals. Please review the following plan we discussed and let me know if I can assist you in the future.   Referrals/Orders/Follow-Ups/Clinician Recommendations: lose weight   This is a list of the screening recommended for you and due dates:  Health Maintenance  Topic Date Due   Flu Shot  08/25/2022   COVID-19 Vaccine (4 - 2023-24 season) 09/25/2022   Mammogram  11/25/2022   DTaP/Tdap/Td vaccine (4 - Td or Tdap) 07/02/2023   Medicare Annual Wellness Visit  10/04/2023   DEXA scan (bone density measurement)  05/24/2025   Colon Cancer Screening  12/28/2025   Pneumonia Vaccine  Completed   Hepatitis C Screening  Completed   Zoster (Shingles) Vaccine  Completed   HPV Vaccine  Aged Out    Advanced directives: (In Chart) A copy of your advanced directives are scanned into your chart should your provider ever need it.  Next Medicare Annual Wellness Visit scheduled for next year: Yes

## 2022-10-11 ENCOUNTER — Ambulatory Visit: Payer: Medicare PPO | Attending: Internal Medicine | Admitting: Internal Medicine

## 2022-10-11 ENCOUNTER — Encounter: Payer: Self-pay | Admitting: Internal Medicine

## 2022-10-11 VITALS — BP 118/64 | HR 79 | Ht 67.0 in | Wt 196.4 lb

## 2022-10-11 DIAGNOSIS — I442 Atrioventricular block, complete: Secondary | ICD-10-CM

## 2022-10-11 NOTE — Progress Notes (Signed)
HPI Kristin [name] returns today for EP followup. She is a 72 yo woman with a h/o remote heart block s/p PPM insertion almost 30 years ago. She has had persistent atrial fib, who has undergone ablation and dofetilide. She has mostly maintained NSR. She denies syncope or chest pain. No edema. She has had some trouble with weight gain though she lost almost 20 lbs since her last visit. She has occaisional palpitations. Her PM was reprogrammed VVI 35.  Allergies  Allergen Reactions   Morphine And Codeine Hives and Itching   Neomycin Itching   Adhesive [Tape] Rash and Other (See Comments)    "Blistering rash"     Current Outpatient Medications  Medication Sig Dispense Refill   amoxicillin (AMOXIL) 500 MG capsule Take by mouth as directed. Take 4 caps by mouth 1 hour prior to dental procedures     apixaban (ELIQUIS) 5 MG TABS tablet Take 1 tablet (5 mg total) by mouth 2 (two) times daily. 180 tablet 1   Calcium Carbonate (CALCIUM 500 PO) Take 500 mg by mouth daily.     Cholecalciferol (VITAMIN D3) 25 MCG (1000 UT) CAPS Take 1,000 Units by mouth daily.     Cyanocobalamin (B12 LIQUID HEALTH BOOSTER PO) Take 1,000 mcg by mouth every Friday.     diltiazem (CARDIZEM CD) 120 MG 24 hr capsule TAKE ONE CAPSULE TWICE DAILY. MAY TAKE EXTRA CAPSULE DAILY FOR BREAKTHROUGH AFIB. 225 capsule 3   dofetilide (TIKOSYN) 500 MCG capsule TAKE ONE CAPSULE TWICE DAILY 180 capsule 3   levothyroxine (SYNTHROID) 112 MCG tablet TAKE ONE TABLET BY MOUTH DAILY 90 tablet 3   Magnesium 250 MG TABS Take 250 mg by mouth every evening.      omeprazole (PRILOSEC) 20 MG capsule Take 1 capsule (20 mg total) by mouth daily. 90 capsule 3   polyethylene glycol (MIRALAX / GLYCOLAX) 17 g packet Take 17 g by mouth 2 (two) times daily. 14 each 0   Potassium 99 MG TABS Take 99 mg by mouth daily.      sodium chloride (OCEAN) 0.65 % SOLN nasal spray Place 1 spray into both nostrils 2 (two) times daily.     vitamin C (ASCORBIC ACID) 500  MG tablet Take 500 mg by mouth daily.     No current facility-administered medications for this visit.     Past Medical History:  Diagnosis Date   AR (allergic rhinitis)    Atrial fibrillation (HCC)    Atrial flutter (HCC)    Clotting disorder (HCC)    Diverticulosis    GERD (gastroesophageal reflux disease)    Hiatal hernia    Hiatal hernia    Hypothyroidism    Migraines    Mild sleep apnea    Osteoarthritis    Osteopenia after menopause 06/14/2017   DEXA 08/2016 [facility]: T = -1.10 radius, -1.0 femur; lumbar spine elevated due to DJD; recheck 2-3 years.   Pacemaker    CHB   Paroxysmal A-fib (HCC)    PUD (peptic ulcer disease)    Sleep apnea    Thyroid nodule    Transient complete heart block 1996    ROS:   All systems reviewed and negative except as noted in the HPI.   Past Surgical History:  Procedure Laterality Date   ATRIAL FIBRILLATION ABLATION N/A 01/08/2019   Procedure: ATRIAL FIBRILLATION ABLATION;  Surgeon: Hillis Range, MD;  Location: [facility];  Service: Cardiovascular;  Laterality: N/A;   ATRIAL FIBRILLATION ABLATION  N/A 04/02/2019   Procedure: ATRIAL FIBRILLATION ABLATION;  Surgeon: Hillis Range, MD;  Location: [facility];  Service: Cardiovascular;  Laterality: N/A;   CARDIOVERSION N/A 01/28/2019   Procedure: CARDIOVERSION;  Surgeon: Little Ishikawa, MD;  Location: [facility] ENDOSCOPY;  Service: Endoscopy;  Laterality: N/A;   CARDIOVERSION N/A 02/27/2019   Procedure: CARDIOVERSION;  Surgeon: Little Ishikawa, MD;  Location: [facility] ENDOSCOPY;  Service: Endoscopy;  Laterality: N/A;   HIP ARTHROPLASTY Right 2008   PACEMAKER GENERATOR CHANGE  03/01/2007   performed by Dr Sharol Harness at [facility] ([employer] [facility])   PACEMAKER INSERTION  09/08/1994   performed in [facility] for transient complete heart block   PPM GENERATOR CHANGEOUT N/A 06/04/2020   Procedure: PPM GENERATOR CHANGEOUT;  Surgeon: Hillis Range, MD;  Location: [facility];   Service: Cardiovascular;  Laterality: N/A;   THYROIDECTOMY, PARTIAL  1986   BENIGN NODULES   TOTAL HIP ARTHROPLASTY Left 02/13/2018   Procedure: TOTAL HIP ARTHROPLASTY ANTERIOR APPROACH;  Surgeon: Durene Romans, MD;  Location: [facility] ORS;  Service: Orthopedics;  Laterality: Left;  70 mins   TOTAL HIP REVISION Right 02/10/2022   Procedure: TOTAL HIP REVISION;  Surgeon: Durene Romans, MD;  Location: [facility] ORS;  Service: Orthopedics;  Laterality: Right;   TUBAL LIGATION  1987     Family History  Problem Relation Age of Onset   Diabetes Mellitus II Maternal Uncle    Cancer Maternal Grandmother        ENDOMETRIAL   Rheum arthritis Mother    Peptic Ulcer Mother    COPD Brother    Drug abuse Brother    Breast cancer Neg Hx      Social History   Socioeconomic History   Marital status: Married    Spouse name: Not on file   Number of children: 0   Years of education: Not on file   Highest education level: Not on file  Occupational History   Occupation: retired  Tobacco Use   Smoking status: Never    Passive exposure: Past   Smokeless tobacco: Never  Vaping Use   Vaping status: Never Used  Substance and Sexual Activity   Alcohol use: Never    Alcohol/week: 1.0 standard drink of alcohol    Types: 1 Glasses of wine per week   Drug use: No   Sexual activity: Not Currently  Other Topics Concern   Not on file  Social History Narrative   [employer]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   Married, no children   Social Determinants of Health   Financial Resource Strain: Low Risk  (10/04/2022)   Overall Financial Resource Strain (CARDIA)    Difficulty of Paying Living Expenses: Not hard at all  Food Insecurity: No Food Insecurity (10/04/2022)   Hunger Vital Sign    Worried About Running Out of Food in the Last Year: Never true    Ran Out of Food in the Last Year: Never true  Transportation Needs: No Transportation Needs (10/04/2022)   PRAPARE - Administrator, Civil Service  (Medical): No    Lack of Transportation (Non-Medical): No  Physical Activity: Sufficiently Active (10/04/2022)   Exercise Vital Sign    Days of Exercise per Week: 5 days    Minutes of Exercise per Session: 90 min  Stress: No Stress Concern Present (10/04/2022)   [employer] of Occupational Health - Occupational Stress Questionnaire    Feeling of Stress : Not at all  Social Connections:  Moderately Integrated (10/04/2022)   Social Connection and Isolation Panel [NHANES]    Frequency of Communication with Friends and Family: More than three times a week    Frequency of Social Gatherings with Friends and Family: Once a week    Attends Religious Services: Never    Database administrator or Organizations: Yes    Attends Banker Meetings: 1 to 4 times per year    Marital Status: Married  Catering manager Violence: Not At Risk (10/04/2022)   Humiliation, Afraid, Rape, and Kick questionnaire    Fear of Current or Ex-Partner: No    Emotionally Abused: No    Physically Abused: No    Sexually Abused: No     BP 118/64   Pulse 79   Ht 5\' 7"  (1.702 m)   Wt 196 lb 6.4 oz (89.1 kg)   SpO2 98%   BMI 30.76 kg/m   Physical Exam:  Well appearing NAD HEENT: Unremarkable Neck:  No JVD, no thyromegally Lymphatics:  No adenopathy Back:  No CVA tenderness Lungs:  Clear with no wheezes HEART:  Regular rate rhythm, no murmurs, no rubs, no clicks Abd:  soft, positive bowel sounds, no organomegally, no rebound, no guarding Ext:  2 plus pulses, no edema, no cyanosis, no clubbing Skin:  No rashes no nodules Neuro:  CN II through XII intact, motor grossly intact  EKG - nsr with PVC and IRBBB  DEVICE  Normal device function.  See PaceArt for details.   Assess/Plan:  Remote CHB - she is conducting with no exceptions. We will follow. Persistent atrial fib - she is maintaining NSR very nicely on dofetilide. Continue. Obesity - she is encouraged to lose weight. Sharlot Gowda Kareen Jefferys,MD

## 2022-10-11 NOTE — Patient Instructions (Signed)
Medication Instructions:  Your physician recommends that you continue on your current medications as directed. Please refer to the Current Medication list given to you today.  *If you need a refill on your cardiac medications before your next appointment, please call your pharmacy*  Lab Work: None ordered.  If you have labs (blood work) drawn today and your tests are completely normal, you will receive your results only by: MyChart Message (if you have MyChart) OR A paper copy in the mail If you have any lab test that is abnormal or we need to change your treatment, we will call you to review the results.  Testing/Procedures: None ordered.  Follow-Up: At [facility], you and your health needs are our priority.  As part of our continuing mission to provide you with exceptional heart care, we have created designated Provider Care Teams.  These Care Teams include your primary Cardiologist (physician) and Advanced Practice Providers (APPs -  Physician Assistants and Nurse Practitioners) who all work together to provide you with the care you need, when you need it.   Your next appointment:   1 year(s)  The format for your next appointment:   In Person  Provider:   Lewayne Bunting, MD{or one of the following Advanced Practice Providers on your designated Care Team:   Francis Dowse, [place] Casimiro Needle "Mardelle Matte" [place], [place] Earnest Rosier, NP   Important Information About Sugar

## 2022-10-28 ENCOUNTER — Other Ambulatory Visit: Payer: Self-pay | Admitting: Family Medicine

## 2022-10-28 DIAGNOSIS — Z1231 Encounter for screening mammogram for malignant neoplasm of breast: Secondary | ICD-10-CM

## 2022-11-19 ENCOUNTER — Other Ambulatory Visit: Payer: Self-pay | Admitting: Internal Medicine

## 2022-11-19 ENCOUNTER — Other Ambulatory Visit: Payer: Self-pay | Admitting: Family Medicine

## 2022-11-21 ENCOUNTER — Ambulatory Visit (INDEPENDENT_AMBULATORY_CARE_PROVIDER_SITE_OTHER): Payer: Medicare PPO | Admitting: Family Medicine

## 2022-11-21 ENCOUNTER — Other Ambulatory Visit (HOSPITAL_COMMUNITY)
Admission: RE | Admit: 2022-11-21 | Discharge: 2022-11-21 | Disposition: A | Discharge status: Home / Self Care | Payer: Medicare PPO | Source: Ambulatory Visit | Attending: Family Medicine | Admitting: Family Medicine

## 2022-11-21 ENCOUNTER — Encounter: Payer: Self-pay | Admitting: Family Medicine

## 2022-11-21 VITALS — BP 130/70 | HR 70 | Temp 97.9°F | Ht 67.0 in | Wt 202.8 lb

## 2022-11-21 DIAGNOSIS — Z780 Asymptomatic menopausal state: Secondary | ICD-10-CM

## 2022-11-21 DIAGNOSIS — E538 Deficiency of other specified B group vitamins: Secondary | ICD-10-CM

## 2022-11-21 DIAGNOSIS — Z950 Presence of cardiac pacemaker: Secondary | ICD-10-CM | POA: Diagnosis not present

## 2022-11-21 DIAGNOSIS — R195 Other fecal abnormalities: Secondary | ICD-10-CM

## 2022-11-21 DIAGNOSIS — Z0001 Encounter for general adult medical examination with abnormal findings: Secondary | ICD-10-CM | POA: Diagnosis not present

## 2022-11-21 DIAGNOSIS — Z7901 Long term (current) use of anticoagulants: Secondary | ICD-10-CM | POA: Diagnosis not present

## 2022-11-21 DIAGNOSIS — I480 Paroxysmal atrial fibrillation: Secondary | ICD-10-CM

## 2022-11-21 DIAGNOSIS — I442 Atrioventricular block, complete: Secondary | ICD-10-CM | POA: Diagnosis not present

## 2022-11-21 DIAGNOSIS — N898 Other specified noninflammatory disorders of vagina: Secondary | ICD-10-CM

## 2022-11-21 DIAGNOSIS — E890 Postprocedural hypothyroidism: Secondary | ICD-10-CM | POA: Diagnosis not present

## 2022-11-21 DIAGNOSIS — Z23 Encounter for immunization: Secondary | ICD-10-CM | POA: Diagnosis not present

## 2022-11-21 DIAGNOSIS — Z96649 Presence of unspecified artificial hip joint: Secondary | ICD-10-CM | POA: Diagnosis not present

## 2022-11-21 DIAGNOSIS — R1030 Lower abdominal pain, unspecified: Secondary | ICD-10-CM

## 2022-11-21 DIAGNOSIS — M160 Bilateral primary osteoarthritis of hip: Secondary | ICD-10-CM

## 2022-11-21 DIAGNOSIS — M8580 Other specified disorders of bone density and structure, unspecified site: Secondary | ICD-10-CM | POA: Diagnosis not present

## 2022-11-21 LAB — CBC WITH DIFFERENTIAL/PLATELET
Basophils Absolute: 0.1 10*3/uL (ref 0.0–0.1)
Basophils Relative: 1.2 % (ref 0.0–3.0)
Eosinophils Absolute: 0 10*3/uL (ref 0.0–0.7)
Eosinophils Relative: 1 % (ref 0.0–5.0)
HCT: 39.2 % (ref 36.0–46.0)
Hemoglobin: 12.7 g/dL (ref 12.0–15.0)
Lymphocytes Relative: 16.5 % (ref 12.0–46.0)
Lymphs Abs: 0.8 10*3/uL (ref 0.7–4.0)
MCHC: 32.3 g/dL (ref 30.0–36.0)
MCV: 96.9 fL (ref 78.0–100.0)
Monocytes Absolute: 0.3 10*3/uL (ref 0.1–1.0)
Monocytes Relative: 7.1 % (ref 3.0–12.0)
Neutro Abs: 3.5 10*3/uL (ref 1.4–7.7)
Neutrophils Relative %: 74.2 % (ref 43.0–77.0)
Platelets: 262 10*3/uL (ref 150.0–400.0)
RBC: 4.05 Mil/uL (ref 3.87–5.11)
RDW: 14.3 % (ref 11.5–15.5)
WBC: 4.8 10*3/uL (ref 4.0–10.5)

## 2022-11-21 LAB — COMPREHENSIVE METABOLIC PANEL
ALT: 15 U/L (ref 0–35)
AST: 24 U/L (ref 0–37)
Albumin: 4.6 g/dL (ref 3.5–5.2)
Alkaline Phosphatase: 100 U/L (ref 39–117)
BUN: 14 mg/dL (ref 6–23)
CO2: 28 meq/L (ref 19–32)
Calcium: 9.9 mg/dL (ref 8.4–10.5)
Chloride: 102 meq/L (ref 96–112)
Creatinine, Ser: 0.79 mg/dL (ref 0.40–1.20)
GFR: 75.28 mL/min (ref >60.00)
Glucose, Bld: 92 mg/dL (ref 70–99)
Potassium: 4.8 meq/L (ref 3.5–5.1)
Sodium: 137 meq/L (ref 135–145)
Total Bilirubin: 0.7 mg/dL (ref 0.2–1.2)
Total Protein: 6.9 g/dL (ref 6.0–8.3)

## 2022-11-21 LAB — VITAMIN D 25 HYDROXY (VIT D DEFICIENCY, FRACTURES): VITD: 27.08 ng/mL — ABNORMAL LOW (ref 30.00–100.00)

## 2022-11-21 LAB — LIPID PANEL
Cholesterol: 221 mg/dL — ABNORMAL HIGH (ref 0–200)
HDL: 97 mg/dL (ref >39.00)
LDL Cholesterol: 105 mg/dL — ABNORMAL HIGH (ref 0–99)
NonHDL: 124.1
Total CHOL/HDL Ratio: 2
Triglycerides: 98 mg/dL (ref 0.0–149.0)
VLDL: 19.6 mg/dL (ref 0.0–40.0)

## 2022-11-21 LAB — VITAMIN B12: Vitamin B-12: 1094 pg/mL — ABNORMAL HIGH (ref 211–911)

## 2022-11-21 LAB — TSH: TSH: 1.61 u[IU]/mL (ref 0.35–5.50)

## 2022-11-21 NOTE — Patient Instructions (Signed)
Please return in 12 months for your annual complete physical; please come fasting.   I will release your lab results to you on your MyChart account with further instructions. You may see the results before I do, but when I review them I will send you a message with my report or have my assistant call you if things need to be discussed. Please reply to my message with any questions. Thank you!   If you have any questions or concerns, please don't hesitate to send me a message via MyChart or call the office at [phone]. Thank you for visiting with us today! It's our pleasure caring for you.  

## 2022-11-21 NOTE — Progress Notes (Signed)
Subjective  Chief Complaint  Patient presents with   Annual Exam   Hypothyroidism    Pt here for Annual Exam and is currently fasting      HPI: Kristin [name] is a 72 y.o. female who presents to [facility] [place] today for a Female Wellness Visit. She also has the concerns and/or needs as listed above in the chief complaint. These will be addressed in addition to the Health Maintenance Visit.   Wellness Visit: annual visit with health maintenance review and exam  HM:.Screens are current.  Mammogram is scheduled for November.  She is getting along well.  Had hip revision surgery in January.  Reviewed Ortho notes.  Flu shot updated today. Chronic disease f/u and/or acute problem visit: (deemed necessary to be done in addition to the wellness visit): Complains of chronic intermittent vaginal discharge, thin without symptoms of itching, odor.  Also with lower pelvic pain that seems more ongoing.  Seems to be related to recent stopping of iron.  She had been on iron for iron deficiency anemia, she tends to have loose stool with iron.  Her normal is constipation.  Now more constipated but having thin caliber stool.  No melena.  No bright red blood per rectum.  Due to chronic lower abdominal pressure, described as feeling like she can get her.,  She is concerned.  Last colonoscopy was normal.  She is not due again until 2027.  No vaginal bleeding.  She did have an ultrasound done last year for 1 episode of postmenopausal vaginal bleeding which showed a small fibroid, otherwise normal. She is on long-term anticoagulation for history of complete heart block status post pacer and PAF.  She remains in sinus rhythm on Tikosyn.  I reviewed recent cardiology notes.  No concerns.  No chest pain. Hypothyroidism on levothyroxine.  Due for recheck.  She feels this is well-controlled.  Assessment  1. Encounter for well adult exam with abnormal findings   2. Need for influenza vaccination    3. Complete heart block (HCC)   4. Hypothyroidism, postsurgical   5. Long term current use of anticoagulant   6. Pacemaker   7. Paroxysmal atrial fibrillation (HCC)   8. Primary osteoarthritis of both hips   9. Osteopenia after menopause   10. S/P revision of total hip   11. Vitamin B12 deficiency   12. Vaginal discharge      Plan  Female Wellness Visit: Age appropriate Health Maintenance and Prevention measures were discussed with patient. Included topics are cancer screening recommendations, ways to keep healthy (see AVS) including dietary and exercise recommendations, regular eye and dental care, use of seat belts, and avoidance of moderate alcohol use and tobacco use.  Mammogram scheduled BMI: discussed patient's BMI and encouraged positive lifestyle modifications to help get to or maintain a target BMI. HM needs and immunizations were addressed and ordered. See below for orders. See HM and immunization section for updates.  Flu shot today Routine labs and screening tests ordered including cmp, cbc and lipids where appropriate. Discussed recommendations regarding Vit D and calcium supplementation (see AVS)  Chronic disease management visit and/or acute problem visit: Lower abdominal pressure and change in stool caliber: Will check lab work, iron levels and CBC.  If iron deficient, will refer back to GI.  Change in stool could be related to change in iron use.  If not improving can consider GYN evaluation as well.  Patient understands and agrees.  She will use  MiraLAX to treat if constipated. Check thyroid function on levothyroxine 112 mcg daily Check cholesterol Continue Eliquis, Cardizem and Tikosyn for PAF.  Well-controlled Continues with orthopedics to follow-up on limp, physical therapy.  She is improving. Vaginal discharge: Vaginal swab ordered to rule out infection. Monitor vitamin D and B12 levels plan chronic GERD/PPI  Follow up: 1 year for complete physical and  recheck Orders Placed This Encounter  Procedures   Flu Vaccine Trivalent High Dose (Fluad)   Vitamin B12   VITAMIN D 25 Hydroxy (Vit-D Deficiency, Fractures)   CBC with Differential/Platelet   Comprehensive metabolic panel   Lipid panel   TSH   Iron, TIBC and Ferritin Panel   No orders of the defined types were placed in this encounter.     Body mass index is 31.76 kg/m. Wt Readings from Last 3 Encounters:  11/21/22 202 lb 12.8 oz (92 kg)  10/11/22 196 lb 6.4 oz (89.1 kg)  10/04/22 190 lb (86.2 kg)     Patient Active Problem List   Diagnosis Date Noted Date Diagnosed   Complete heart block (HCC) 10/06/2015     Priority: High    Overview:  s/p pacemaker    Long term current use of anticoagulant 08/05/2015     Priority: High   Pacemaker 10/14/2014     Priority: High   Paroxysmal atrial fibrillation (HCC) 10/14/2014     Priority: High    S/p ablation 12/2018, Dr. 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 Charlann Boxer    Fibroid 01/11/2022     Priority: Medium     TVUS 12/2021 for postmenopausal bleeding.  Cervical stenosis as well Thin endometrium    OSA (obstructive sleep apnea) 10/20/2017     Priority: Medium     Class: Chronic    Mild; did not tolerate cpap    Osteopenia after menopause 06/14/2017     Priority: Medium     DEXA 04/2022 [facility]: T = - 2.1 at radius. Femurs excluded due to hardware. L1-L4 T - 0.3; osteopenia. Recheck 2-3 years.  DEXA 10/2018 [facility]: T = -1.4 lowest, stable osteopenia, recheck 2-3 years.  DEXA 08/2016 [facility]: T = -1.10 radius, -1.0 femur; lumbar spine elevated due to DJD; recheck 2-3 years.    Osteoarthritis of left hip 05/11/2017     Priority: Medium    GERD (gastroesophageal reflux disease) 01/08/2013     Priority: Medium     EGD 2021: normal. 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  Evette Cristal    OA (osteoarthritis) 01/08/2013     Priority: Medium    History of peptic ulcer disease - nsaid 01/08/2013     Priority: Medium     Overview:  Due to Nsaids    Diverticulosis of large intestine without hemorrhage 01/04/2016     Priority: Low   Internal hemorrhoids without complication 01/04/2016     Priority: Low   AR (allergic rhinitis) 01/08/2013     Priority: Low   S/P left THA, AA 02/13/2018     Priority: 2.   Left atrial enlargement 11/16/2020    Atrial flutter [facility])     Health Maintenance  Topic Date Due   COVID-19 Vaccine (4 - 2023-24 season) 12/07/2022 (Originally 09/25/2022)   MAMMOGRAM  11/25/2022   DTaP/Tdap/Td (4 - Td or Tdap) 07/02/2023  Medicare Annual Wellness (AWV)  10/04/2023   DEXA SCAN  05/24/2025   Colonoscopy  12/28/2025   Pneumonia Vaccine 76+ Years old  Completed   INFLUENZA VACCINE  Completed   Hepatitis C Screening  Completed   Zoster Vaccines- Shingrix  Completed   HPV VACCINES  Aged Out   Immunization History  Administered Date(s) Administered   Fluad Quad(high Dose 65+) 11/01/2016, 10/08/2018, 11/14/2019, 11/16/2020, 11/17/2021   Fluad Trivalent(High Dose 65+) 01/08/2013, 11/21/2022   Hepatitis B, ADULT 11/24/2009   Hepatitis B, PED/ADOLESCENT 11/24/2009   Influenza, High Dose Seasonal PF 11/01/2016, 10/20/2017   Influenza, Quadrivalent, Recombinant, Inj, Pf 01/08/2013   Influenza, Seasonal, Injecte, Preservative Fre 11/11/2014   Influenza,inj,Quad PF,6+ Mos 12/24/2015   Influenza-Unspecified 11/01/2016   Moderna Sars-Covid-2 Vaccination 02/18/2019, 03/18/2019, 12/30/2019   Pneumococcal Conjugate-13 08/17/2016   Pneumococcal Polysaccharide-23 10/20/2017   Tdap 09/07/2010, 09/07/2010, 07/01/2013   Zoster Recombinant(Shingrix) 07/26/2017, 11/20/2017   Zoster, Live 01/08/2013   We updated and reviewed the patient's past history in detail and it is documented below. Allergies: Patient is allergic to morphine and codeine, neomycin,  and adhesive [tape]. Past Medical History Patient  has a past medical history of AR (allergic rhinitis), Atrial fibrillation (HCC), Atrial flutter (HCC), Clotting disorder (HCC), Diverticulosis, GERD (gastroesophageal reflux disease), Hiatal hernia, Hiatal hernia, Hypothyroidism, Migraines, Mild sleep apnea, Osteoarthritis, Osteopenia after menopause (06/14/2017), Pacemaker, Paroxysmal A-fib (HCC), PUD (peptic ulcer disease), Sleep apnea, Thyroid nodule, and Transient complete heart block (1996). Past Surgical History Patient  has a past surgical history that includes Thyroidectomy, partial (1986); Tubal ligation (1987); Pacemaker insertion (09/08/1994); Pacemaker generator change (03/01/2007); Hip Arthroplasty (Right, 2008); Total hip arthroplasty (Left, 02/13/2018); ATRIAL FIBRILLATION ABLATION (N/A, 01/08/2019); Cardioversion (N/A, 01/28/2019); Cardioversion (N/A, 02/27/2019); ATRIAL FIBRILLATION ABLATION (N/A, 04/02/2019); PPM GENERATOR CHANGEOUT (N/A, 06/04/2020); and Total hip revision (Right, 02/10/2022). Family History: Patient family history includes COPD in her brother; Cancer in her maternal grandmother; Diabetes Mellitus II in her maternal uncle; Drug abuse in her brother; Peptic Ulcer in her mother; Rheum arthritis in her mother. Social History:  Patient  reports that she has never smoked. She has been exposed to tobacco smoke. She has never used smokeless tobacco. She reports that she does not drink alcohol and does not use drugs.  Review of Systems: Constitutional: negative for fever or malaise Ophthalmic: negative for photophobia, double vision or loss of vision Cardiovascular: negative for chest pain, dyspnea on exertion, or new LE swelling Respiratory: negative for SOB or persistent cough Gastrointestinal: negative for abdominal pain, change in bowel habits or melena Genitourinary: negative for dysuria or gross hematuria, no abnormal uterine bleeding or disharge Musculoskeletal: negative for  new gait disturbance or muscular weakness Integumentary: negative for new or persistent rashes, no breast lumps Neurological: negative for TIA or stroke symptoms Psychiatric: negative for SI or delusions Allergic/Immunologic: negative for hives  Patient Care Team    Relationship Specialty Notifications Start End  Willow Ora, MD PCP - General Family Medicine  06/14/17   Hillis Range, MD (Inactive) PCP - Cardiology Cardiology  12/01/21   Anson Fret, MD Consulting Physician Neurology  06/14/17   Durene Romans, MD Consulting Physician Orthopedic Surgery  06/14/17   Hillis Range, MD (Inactive) Consulting Physician Cardiology  06/14/17   Stanton Kidney, MD Consulting Physician Gastroenterology  06/14/17     Objective  Vitals: BP 130/70   Pulse 70   Temp 97.9 F (36.6 C)   Ht 5\' 7"  (1.702 m)   Wt 202 lb 12.8  oz (92 kg)   SpO2 98%   BMI 31.76 kg/m  General:  Well developed, well nourished, no acute distress, limps Psych:  Alert and orientedx3,normal mood and affect HEENT:  Normocephalic, atraumatic, non-icteric sclera,  supple neck without adenopathy, mass or thyromegaly Cardiovascular:  Normal S1, S2, RRR without gallop, rub positive soft murmur Respiratory:  Good breath sounds bilaterally, CTAB with normal respiratory effort Gastrointestinal: normal bowel sounds, soft, non-tender, no noted masses. No HSM MSK: extremities without edema, joints without erythema or swelling Neurologic:    Mental status is normal.  Gross motor and sensory exams are normal.  No tremor  Commons side effects, risks, benefits, and alternatives for medications and treatment plan prescribed today were discussed, and the patient expressed understanding of the given instructions. Patient is instructed to call or message via MyChart if he/she has any questions or concerns regarding our treatment plan. No barriers to understanding were identified. We discussed Red Flag symptoms and signs in detail. Patient  expressed understanding regarding what to do in case of urgent or emergency type symptoms.  Medication list was reconciled, printed and provided to the patient in AVS. Patient instructions and summary information was reviewed with the patient as documented in the AVS. This note was prepared with assistance of Dragon voice recognition software. Occasional wrong-word or sound-a-like substitutions may have occurred due to the inherent limitations of voice recognition software

## 2022-11-22 ENCOUNTER — Telehealth: Payer: Self-pay | Admitting: Family Medicine

## 2022-11-22 LAB — IRON,TIBC AND FERRITIN PANEL
%SAT: 24 % (ref 16–45)
Ferritin: 57 ng/mL (ref 16–288)
Iron: 87 ug/dL (ref 45–160)
TIBC: 362 ug/dL (ref 250–450)

## 2022-11-22 LAB — CERVICOVAGINAL ANCILLARY ONLY
Bacterial Vaginitis (gardnerella): NEGATIVE
Candida Glabrata: NEGATIVE
Candida Vaginitis: NEGATIVE
Comment: NEGATIVE
Comment: NEGATIVE
Comment: NEGATIVE
Comment: NEGATIVE
Trichomonas: NEGATIVE

## 2022-11-22 NOTE — Progress Notes (Signed)
See mychart note  The 10-year ASCVD risk score (Arnett DK, et al., 2019) is: 10.2%   Values used to calculate the score:     Age: 72 years     Sex: Female     Is Non-Hispanic African American: No     Diabetic: No     Tobacco smoker: No     Systolic Blood Pressure: 130 mmHg     Is BP treated: No     HDL Cholesterol: 97 mg/dL     Total Cholesterol: 221 mg/dL  Dear Ms. Kristin [name], Your lab results look good overall. There are no signs of kidney or liver problems. You are no longer anemic and your iron levels and B12 levels are good. Your female swab does not show any infection. Your thyroid is at goal. Your cholesterol levels are a little worse overall than in years prior: eat healthy and we will monitor. If they stay up, we can consider adding cholesterol lowering medication.   Treat your constipation/bowel changes and let me know if things do not improve. We would then get you to GI and/or  GYN.  Let me know if you have questions.  Sincerely, Dr. Mardelle Matte

## 2022-11-22 NOTE — Telephone Encounter (Signed)
Patient states had an OV yesterday and forgot to ask PCP for tramadol refill. Please advise.

## 2022-11-23 MED ORDER — TRAMADOL HCL 50 MG PO TABS: 50.0000 mg | ORAL_TABLET | Freq: Every day | ORAL | 1 refills | Status: DC | PRN

## 2022-12-05 ENCOUNTER — Encounter: Payer: Self-pay | Admitting: Family Medicine

## 2022-12-05 DIAGNOSIS — D219 Benign neoplasm of connective and other soft tissue, unspecified: Secondary | ICD-10-CM

## 2022-12-05 DIAGNOSIS — N950 Postmenopausal bleeding: Secondary | ICD-10-CM

## 2022-12-05 DIAGNOSIS — R1030 Lower abdominal pain, unspecified: Secondary | ICD-10-CM

## 2022-12-06 ENCOUNTER — Ambulatory Visit
Admission: RE | Admit: 2022-12-06 | Discharge: 2022-12-06 | Disposition: A | Discharge status: Home / Self Care | Payer: Medicare PPO | Source: Ambulatory Visit | Attending: Family Medicine | Admitting: Family Medicine

## 2022-12-06 ENCOUNTER — Other Ambulatory Visit: Payer: Self-pay

## 2022-12-06 DIAGNOSIS — Z1231 Encounter for screening mammogram for malignant neoplasm of breast: Secondary | ICD-10-CM | POA: Diagnosis not present

## 2022-12-06 DIAGNOSIS — R109 Unspecified abdominal pain: Secondary | ICD-10-CM

## 2022-12-06 DIAGNOSIS — K5900 Constipation, unspecified: Secondary | ICD-10-CM

## 2022-12-09 ENCOUNTER — Ambulatory Visit (INDEPENDENT_AMBULATORY_CARE_PROVIDER_SITE_OTHER): Payer: Medicare PPO

## 2022-12-09 ENCOUNTER — Other Ambulatory Visit: Payer: Self-pay | Admitting: Family Medicine

## 2022-12-09 DIAGNOSIS — I442 Atrioventricular block, complete: Secondary | ICD-10-CM

## 2022-12-09 DIAGNOSIS — R928 Other abnormal and inconclusive findings on diagnostic imaging of breast: Secondary | ICD-10-CM

## 2022-12-10 LAB — CUP PACEART REMOTE DEVICE CHECK
Battery Remaining Longevity: 156 mo
Battery Voltage: 3.04 V
Brady Statistic AP VP Percent: 0 %
Brady Statistic AP VS Percent: 0 %
Brady Statistic AS VP Percent: 0.01 %
Brady Statistic AS VS Percent: 99.99 %
Brady Statistic RA Percent Paced: 0 %
Brady Statistic RV Percent Paced: 0.01 %
Date Time Interrogation Session: 20241115100239
Implantable Lead Connection Status: 753985
Implantable Lead Connection Status: 753985
Implantable Lead Implant Date: 19960815
Implantable Lead Implant Date: 19960815
Implantable Lead Location: 753859
Implantable Lead Location: 753860
Implantable Lead Model: 5024
Implantable Pulse Generator Implant Date: 20220512
Lead Channel Impedance Value: 323 Ohm
Lead Channel Impedance Value: 399 Ohm
Lead Channel Impedance Value: 475 Ohm
Lead Channel Impedance Value: 532 Ohm
Lead Channel Pacing Threshold Amplitude: 1.375 V
Lead Channel Pacing Threshold Pulse Width: 0.4 ms
Lead Channel Sensing Intrinsic Amplitude: 2.875 mV
Lead Channel Sensing Intrinsic Amplitude: 5.625 mV
Lead Channel Sensing Intrinsic Amplitude: 5.625 mV
Lead Channel Setting Pacing Amplitude: 3.25 V
Lead Channel Setting Pacing Pulse Width: 0.4 ms
Lead Channel Setting Sensing Sensitivity: 0.9 mV
Zone Setting Status: 755011

## 2022-12-21 NOTE — Progress Notes (Signed)
Remote pacemaker transmission.   

## 2022-12-27 ENCOUNTER — Ambulatory Visit
Admission: RE | Admit: 2022-12-27 | Discharge: 2022-12-27 | Disposition: A | Discharge status: Home / Self Care | Payer: Medicare PPO | Source: Ambulatory Visit | Attending: Family Medicine | Admitting: Family Medicine

## 2022-12-27 ENCOUNTER — Ambulatory Visit: Payer: Medicare PPO

## 2022-12-27 DIAGNOSIS — R928 Other abnormal and inconclusive findings on diagnostic imaging of breast: Secondary | ICD-10-CM | POA: Diagnosis not present

## 2023-01-02 ENCOUNTER — Telehealth: Payer: Self-pay | Admitting: Family Medicine

## 2023-01-02 NOTE — Telephone Encounter (Signed)
Patient requests to be called re: Referral to OBGYN is not going to work. Patient was told Dr. Hyacinth Meeker is not taking any new Patients's and that Patient would have see a Mid wife. Patient states she does not want a Mid wife.

## 2023-01-02 NOTE — Telephone Encounter (Signed)
Please refer to [employer]. See pt mychart note.   Needs appt w/in 1-2 months please.

## 2023-01-20 ENCOUNTER — Encounter: Payer: Self-pay | Admitting: Internal Medicine

## 2023-01-30 DIAGNOSIS — N950 Postmenopausal bleeding: Secondary | ICD-10-CM | POA: Diagnosis not present

## 2023-01-30 DIAGNOSIS — R102 Pelvic and perineal pain: Secondary | ICD-10-CM | POA: Diagnosis not present

## 2023-02-14 ENCOUNTER — Other Ambulatory Visit: Payer: Self-pay | Admitting: Internal Medicine

## 2023-02-14 DIAGNOSIS — Z124 Encounter for screening for malignant neoplasm of cervix: Secondary | ICD-10-CM | POA: Diagnosis not present

## 2023-02-14 DIAGNOSIS — R102 Pelvic and perineal pain: Secondary | ICD-10-CM | POA: Diagnosis not present

## 2023-02-14 DIAGNOSIS — I480 Paroxysmal atrial fibrillation: Secondary | ICD-10-CM

## 2023-02-14 NOTE — Telephone Encounter (Signed)
Prescription refill request for Eliquis received. Indication:aflutter Last office visit:9/24 Scr:0.79  10/24 Age: 73 Weight:92  kg  Prescription refilled

## 2023-03-10 ENCOUNTER — Ambulatory Visit (INDEPENDENT_AMBULATORY_CARE_PROVIDER_SITE_OTHER): Payer: Medicare PPO

## 2023-03-10 DIAGNOSIS — I442 Atrioventricular block, complete: Secondary | ICD-10-CM | POA: Diagnosis not present

## 2023-03-10 LAB — CUP PACEART REMOTE DEVICE CHECK
Battery Remaining Longevity: 153 mo
Battery Voltage: 3.04 V
Brady Statistic AP VP Percent: 0 %
Brady Statistic AP VS Percent: 0 %
Brady Statistic AS VP Percent: 0.01 %
Brady Statistic AS VS Percent: 99.99 %
Brady Statistic RA Percent Paced: 0 %
Brady Statistic RV Percent Paced: 0.01 %
Date Time Interrogation Session: 20250214002110
Implantable Lead Connection Status: 753985
Implantable Lead Connection Status: 753985
Implantable Lead Implant Date: 19960815
Implantable Lead Implant Date: 19960815
Implantable Lead Location: 753859
Implantable Lead Location: 753860
Implantable Lead Model: 5024
Implantable Pulse Generator Implant Date: 20220512
Lead Channel Impedance Value: 304 Ohm
Lead Channel Impedance Value: 399 Ohm
Lead Channel Impedance Value: 437 Ohm
Lead Channel Impedance Value: 475 Ohm
Lead Channel Pacing Threshold Amplitude: 1.375 V
Lead Channel Pacing Threshold Pulse Width: 0.4 ms
Lead Channel Sensing Intrinsic Amplitude: 2.875 mV
Lead Channel Sensing Intrinsic Amplitude: 5.5 mV
Lead Channel Sensing Intrinsic Amplitude: 5.5 mV
Lead Channel Setting Pacing Amplitude: 3 V
Lead Channel Setting Pacing Pulse Width: 0.4 ms
Lead Channel Setting Sensing Sensitivity: 0.9 mV
Zone Setting Status: 755011

## 2023-03-12 ENCOUNTER — Encounter: Payer: Self-pay | Admitting: Internal Medicine

## 2023-04-04 ENCOUNTER — Ambulatory Visit: Payer: Medicare PPO | Admitting: Internal Medicine

## 2023-04-04 ENCOUNTER — Encounter: Payer: Self-pay | Admitting: Internal Medicine

## 2023-04-04 ENCOUNTER — Telehealth: Payer: Self-pay

## 2023-04-04 VITALS — BP 110/64 | HR 80 | Ht 66.5 in | Wt 215.1 lb

## 2023-04-04 DIAGNOSIS — K219 Gastro-esophageal reflux disease without esophagitis: Secondary | ICD-10-CM | POA: Diagnosis not present

## 2023-04-04 DIAGNOSIS — R194 Change in bowel habit: Secondary | ICD-10-CM

## 2023-04-04 DIAGNOSIS — R1030 Lower abdominal pain, unspecified: Secondary | ICD-10-CM

## 2023-04-04 DIAGNOSIS — Z8711 Personal history of peptic ulcer disease: Secondary | ICD-10-CM | POA: Diagnosis not present

## 2023-04-04 DIAGNOSIS — K449 Diaphragmatic hernia without obstruction or gangrene: Secondary | ICD-10-CM

## 2023-04-04 MED ORDER — SUFLAVE 178.7 G PO SOLR: 1.0000 | Freq: Once | ORAL | 0 refills | Status: AC

## 2023-04-04 MED ORDER — SUFLAVE 178.7 G PO SOLR: 1.0000 | Freq: Once | ORAL | 0 refills | Status: DC

## 2023-04-04 NOTE — Progress Notes (Unsigned)
 Chief Complaint: Lower ab pain  HPI : 73 year old female with history of A-fib on Eliquis, complete heart block s/p PM, OSA, osteoarthritis presents with lower ab pain and change in bowel habits  She started having lower abdominal pain about 6 months ago. She had constipation after starting Tikosyn a couple of years ago. An iron supplement also caused her to have irregular bowel habits. After she stopped the iron, she continue to have loose stools and constipation. She has typically one BM per day. Stools can be looser with 1-3 BMs per day with an occasional normal stool, and then change to one BM every 1-2 days when she is constipated. Her lower ab pain feels like she is about to have her menstrual period. The pain is dull and is very uncomfortable. She went to an OB/GYN and they did an ultrasound that revealed an fibroid, which has not changed in size. Patient was told that there was nothing gynecological causing the pain. She was started on gabapentin, which has helped with the ab pain. She is on omeprazole 20 mg once a day to help with GERD and potential esophagitis. Denies dysphagia currently. She takes Miralax on occasion. When she took Miralax daily, she would be constantly on the toilet. She was on Benefiber in the past, but this seemed to cause worsened ab pain. Denies hematochezia or melena. She has lost some weight, which is intentional. Denies family history of GI issues.   Past Medical History:  Diagnosis Date   Anemia    AR (allergic rhinitis)    Atrial fibrillation (HCC)    Atrial flutter (HCC)    Clotting disorder (HCC)    pt denies   Diverticulosis    Esophageal ulcer    due to Motrin   GERD (gastroesophageal reflux disease)    Hiatal hernia    HLD (hyperlipidemia)    Hypothyroidism    IBS (irritable bowel syndrome)    Migraines    Mild sleep apnea    Osteoarthritis    Osteopenia after menopause 06/14/2017   DEXA 08/2016 [facility]: T = -1.10 radius, -1.0 femur; lumbar  spine elevated due to DJD; recheck 2-3 years.   Pacemaker    CHB   Paroxysmal A-fib (HCC)    PUD (peptic ulcer disease)    Sleep apnea    Thyroid nodule    Transient complete heart block 1996     Past Surgical History:  Procedure Laterality Date   ATRIAL FIBRILLATION ABLATION N/A 01/08/2019   Procedure: ATRIAL FIBRILLATION ABLATION;  Surgeon: Hillis Range, MD;  Location: [facility];  Service: Cardiovascular;  Laterality: N/A;   ATRIAL FIBRILLATION ABLATION N/A 04/02/2019   Procedure: ATRIAL FIBRILLATION ABLATION;  Surgeon: Hillis Range, MD;  Location: [facility];  Service: Cardiovascular;  Laterality: N/A;   CARDIOVERSION N/A 01/28/2019   Procedure: CARDIOVERSION;  Surgeon: Little Ishikawa, MD;  Location: [facility] ENDOSCOPY;  Service: Endoscopy;  Laterality: N/A;   CARDIOVERSION N/A 02/27/2019   Procedure: CARDIOVERSION;  Surgeon: Little Ishikawa, MD;  Location: [facility] ENDOSCOPY;  Service: Endoscopy;  Laterality: N/A;   HIP ARTHROPLASTY Right 2008   PACEMAKER GENERATOR CHANGE  03/01/2007   performed by Dr Sharol Harness at [facility] ([employer] [facility])   PACEMAKER INSERTION  09/08/1994   performed in [facility] for transient complete heart block   PPM GENERATOR CHANGEOUT N/A 06/04/2020   Procedure: PPM GENERATOR CHANGEOUT;  Surgeon: Hillis Range, MD;  Location: [facility];  Service: Cardiovascular;  Laterality:  N/A;   THYROIDECTOMY, PARTIAL  1986   BENIGN NODULES   TOTAL HIP ARTHROPLASTY Left 02/13/2018   Procedure: TOTAL HIP ARTHROPLASTY ANTERIOR APPROACH;  Surgeon: Durene Romans, MD;  Location: [facility] ORS;  Service: Orthopedics;  Laterality: Left;  70 mins   TOTAL HIP REVISION Right 02/10/2022   Procedure: TOTAL HIP REVISION;  Surgeon: Durene Romans, MD;  Location: [facility] ORS;  Service: Orthopedics;  Laterality: Right;   TUBAL LIGATION  1987   Family History  Problem Relation Age of Onset   Rheum arthritis Mother    Peptic Ulcer Mother    COPD Brother    Drug abuse  Brother    Cancer Maternal Grandmother    Uterine cancer Paternal Grandmother    Diabetes Mellitus II Maternal Aunt    Breast cancer Neg Hx    Social History   Tobacco Use   Smoking status: Never    Passive exposure: Past   Smokeless tobacco: Never  Vaping Use   Vaping status: Never Used  Substance Use Topics   Alcohol use: Never    Alcohol/week: 1.0 standard drink of alcohol    Types: 1 Glasses of wine per week   Drug use: No   Current Outpatient Medications  Medication Sig Dispense Refill   amoxicillin (AMOXIL) 500 MG capsule Take by mouth as directed. Take 4 caps by mouth 1 hour prior to dental procedures     Calcium Carbonate (CALCIUM 500 PO) Take 500 mg by mouth daily.     Cholecalciferol (VITAMIN D3) 25 MCG (1000 UT) CAPS Take 1,000 Units by mouth daily.     Cyanocobalamin (B12 LIQUID HEALTH BOOSTER PO) Take 1,000 mcg by mouth every Friday.     diltiazem (CARDIZEM CD) 120 MG 24 hr capsule TAKE ONE CAPSULE TWICE DAILY. MAY TAKE EXTRA CAPSULE DAILY FOR BREAKTHROUGH AFIB. 225 capsule 3   dofetilide (TIKOSYN) 500 MCG capsule TAKE ONE CAPSULE BY MOUTH TWICE DAILY 180 capsule 3   ELIQUIS 5 MG TABS tablet TAKE 1 TABLET TWICE DAILY 180 tablet 3   gabapentin (NEURONTIN) 100 MG capsule Take 100 mg by mouth 3 (three) times daily as needed.     levothyroxine (SYNTHROID) 112 MCG tablet TAKE ONE TABLET BY MOUTH DAILY 90 tablet 3   Magnesium 250 MG TABS Take 250 mg by mouth every evening.      omeprazole (PRILOSEC) 20 MG capsule Take 1 capsule (20 mg total) by mouth daily. 90 capsule 3   polyethylene glycol (MIRALAX / GLYCOLAX) 17 g packet Take 17 g by mouth 2 (two) times daily. 14 each 0   Potassium 99 MG TABS Take 99 mg by mouth daily.      sodium chloride (OCEAN) 0.65 % SOLN nasal spray Place 1 spray into both nostrils 2 (two) times daily.     traMADol (ULTRAM) 50 MG tablet Take 1 tablet (50 mg total) by mouth daily as needed for moderate pain (pain score 4-6). 30 tablet 1   vitamin C  (ASCORBIC ACID) 500 MG tablet Take 500 mg by mouth daily.     No current facility-administered medications for this visit.   Allergies  Allergen Reactions   Morphine Hives and Itching   Neomycin Itching   Adhesive [Tape] Rash and Other (See Comments)    "Blistering rash"     Review of Systems: All systems reviewed and negative except where noted in HPI.   Physical Exam: BP 110/64 (BP Location: Left Arm, Patient Position: Sitting, Cuff Size: Normal)   Pulse 80  Ht 5' 6.5" (1.689 m) Comment: height measured without shoes  Wt 215 lb 2 oz (97.6 kg)   BMI 34.20 kg/m  Constitutional: Pleasant,well-developed, female in no acute distress. HEENT: Normocephalic and atraumatic. Conjunctivae are normal. No scleral icterus. Cardiovascular: Normal rate Pulmonary/chest: Effort normal Abdominal: Soft, nondistended, tender in the lower abdomen Extremities: No edema Neurological: Alert and oriented to person place and time. Skin: Skin is warm and dry. No rashes noted. Psychiatric: Normal mood and affect. Behavior is normal.  Blood work September 2020 shows normal CBC, normal complete metabolic profile, normal pancreatic enzymes, normal sedimentation rate.  Labs 10/2022: CBC nml. CMP nml. Vit D low at 27. Vit B12 nml. TSH nml. Iron studies nml.   CT scan abdomen pelvis with IV and oral contrast October 2020 indication "left-sided abdominal pain for several months"; findings "sigmoid diverticulosis.  No radiographic evidence of diverticulitis or other acute findings"   EGD December 2007 Dr. Evette Cristal at [facility] gastroenterology indication "heartburn, dysphagia".  Findings LA grade B esophagitis.  Biopsies were taken.  Mild stenosis was found 34 cm from the incisors.  Hiatal hernia was noted.  She was recommended be on proton pump inhibitor twice daily.  Biopsies showed no H. pylori,, GE junction biopsies showed "ulceration and marked squamous atypia"   Colonoscopy December 2007 Dr. Druscilla Brownie  gastroenterology, indication average risk screening" nodular mucosa was seen at the base of the cecum these were biopsied.  Pathology report suggested "ulcerated colonic mucosa" comment section showed that this was "strongly suggestive of ischemia" Follow-up office note with Dr. Evette Cristal at [facility] gastroenterology January 2008 he felt that the area at her cecum was probably related to a lot of Motrin which she had been taking around that time.  EGD December 2017 Dr. Norma Fredrickson in [place].  Indication "dysphagia, peptic stricture, chronic gastric ulcer" findings normal esophagus, normal duodenum, polyp in the gastric body, polyps in the cardia and fundus.  Biopsied.  I do not have the pathology results at the time of this visit.  A letter to the patient was written with regard to the biopsies and it said that the "small polyp we biopsied was not cancerous, as I had expected.  Good news."   Colonoscopy December 2017 Dr. Norma Fredrickson in [place], indication "screening" findings "grade 2 internal hemorrhoids, moderate diverticulosis of the sigmoid colon"   EGD December 2020 Dr. Lavon Paganini: Described small hiatal hernia, fundic gland polyps.  The examination was otherwise completely normal.  She was given a trial of dicyclomine 10 mg 3 times daily as needed for abdominal pains.  She was instructed to follow-up in 2 or 3 months.    ASSESSMENT AND PLAN: Lower ab pain Change in bowel habits Alternating constipation and loose stools GERD Small hiatal hernia History of esophagitis and esophageal ulcer Patient presents with lower abdominal pain that has been present for the last 6 months.  She has also noted a change in her bowel habits with alternating constipation and loose stools.  The only thing that has helped with her abdominal pain is gabapentin.  At this time it is not clear to me what is causing her lower abdominal pain.  Patient could have underlying constipation, though taking daily MiraLAX did not appear to  improve her symptoms.  Fiber supplement has not helped her in the past either. Will plan for further evaluation with a CT scan and colonoscopy due to her lower abdominal pain and change in her bowel habits.  Will plan for an EGD for evaluation  of her GERD and to screen for Barrett's esophagus. - Cont omeprazole 20 mg QD - CT A/P w/contrast - EGD/colonoscopy [facility]. Will need her Eliquis held for 2 days before her procedure.   Kristin Pont, MD  I spent 50 minutes of time, including in depth chart review, independent review of results as outlined above, communicating results with the patient directly, face-to-face time with the patient, coordinating care, ordering studies and medications as appropriate, and documentation.

## 2023-04-04 NOTE — Patient Instructions (Signed)
 You have been scheduled for a CT scan of the abdomen and pelvis at [facility], 1st floor Radiology. You are scheduled on 04/07/23 at 4:30 pm. You should arrive 15 minutes prior to your appointment time for registration.    You may take any medications as prescribed with a small amount of water, if necessary. If you take any of the following medications: METFORMIN, GLUCOPHAGE, GLUCOVANCE, AVANDAMET, RIOMET, FORTAMET, ACTOPLUS MET, JANUMET, GLUMETZA or METAGLIP, you MAY be asked to HOLD this medication 48 hours AFTER the exam.   The purpose of you drinking the oral contrast is to aid in the visualization of your intestinal tract. The contrast solution may cause some diarrhea. Depending on your individual set of symptoms, you may also receive an intravenous injection of x-ray contrast/dye. Plan on being at [facility] for 45 minutes or longer, depending on the type of exam you are having performed.   If you have any questions regarding your exam or if you need to reschedule, you may call [facility] at [phone] between the hours of 8:00 am and 5:00 pm, Monday-Friday.   You have been scheduled for a colonoscopy. Please follow written instructions given to you at your visit today.   If you use inhalers (even only as needed), please bring them with you on the day of your procedure.  DO NOT TAKE 7 DAYS PRIOR TO TEST- Trulicity (dulaglutide) Ozempic, Wegovy (semaglutide) Mounjaro (tirzepatide) Bydureon Bcise (exanatide extended release)  DO NOT TAKE 1 DAY PRIOR TO YOUR TEST Rybelsus (semaglutide) Adlyxin (lixisenatide) Victoza (liraglutide) Byetta (exanatide)  You will be contacted by our office prior to your procedure for directions on holding your Eliquis.  If you do not hear from our office 1 week prior to your scheduled procedure, please call [phone] to discuss.  You will receive your bowel preparation through [facility], which ensures the lowest copay and home  delivery, with outreach via text or call from an [phone] number. Please respond promptly to avoid rescheduling of your procedure. If you are interested in alternative options or have any questions regarding your prep, please contact them at [phone] ____________________________________________________________________________  Your Provider Has Sent Your Bowel Prep Regimen To [facility]   [facility] will contact you to verify your information and collect your copay, if applicable. Enjoy the comfort of your home while your prescription is mailed to you, FREE of any shipping charges.   [facility] accepts all major insurance benefits and applies discounts & coupons.  Have additional questions?   Chat: [email or web address] Call: [phone] Email: [email or web address] NCPDP: [id number]  How will [facility] contact you?  With a Welcome phone call,  a Welcome text and a checkout link in text form.  Texts you receive from [phone] Are NOT Spam.  *To set up delivery, you must complete the checkout process via link or speak to one of the patient care representatives. If [facility] is unable to reach you, your prescription may be delayed.  To avoid long hold times on the phone, you may also utilize the secure chat feature on the [facility] website to request that they call you back for transaction completion or to expedite your concerns.   If your blood pressure at your visit was 140/90 or greater, please contact your primary care physician to follow up on this.  _______________________________________________________  If you are age 72 or older, your body mass index should be between 23-30. Your Body mass index is 34.2 kg/m. If this is out of the aforementioned range listed,  please consider follow up with your Primary Care Provider.  If you are age 76 or younger, your body mass index should be between 19-25. Your Body mass index is 34.2 kg/m. If this is out of the aformentioned  range listed, please consider follow up with your Primary Care Provider.   ________________________________________________________  The [place] [facility] providers would like to encourage you to use [facility] to communicate with providers for non-urgent requests or questions.  Due to long hold times on the telephone, sending your provider a message by [facility] may be a faster and more efficient way to get a response.  Please allow 48 business hours for a response.  Please remember that this is for non-urgent requests.  _______________________________________________________ Due to recent changes in healthcare laws, you may see the results of your imaging and laboratory studies on MyChart before your provider has had a chance to review them.  We understand that in some cases there may be results that are confusing or concerning to you. Not all laboratory results come back in the same time frame and the provider may be waiting for multiple results in order to interpret others.  Please give [place] 48 hours in order for your provider to thoroughly review all the results before contacting the office for clarification of your results.   Thank you for entrusting me with your care and for choosing [facility],  Dr. Eulah Pont

## 2023-04-04 NOTE — Telephone Encounter (Signed)
 Will route to PharmD for rec's re: holding anticoagulation. Tereso Newcomer, PA-C    04/04/2023 10:54 AM

## 2023-04-04 NOTE — Telephone Encounter (Signed)
 [facility] Pre-operative Risk Assessment     Request for surgical clearance:     Endoscopy Procedure  What type of surgery is being performed?     Endoscopy/Colonoscopy   When is this surgery scheduled?     05/04/23  What type of clearance is required ?   Pharmacy  Are there any medications that need to be held prior to surgery and how long? Eliquis 2 days   Practice name and name of physician performing surgery?       Gastroenterology  What is your office phone and fax number?      Phone- [phone]  Fax- [phone]  Anesthesia type (None, local, MAC, general) ?       MAC   Please route your response to [facility]

## 2023-04-04 NOTE — Telephone Encounter (Signed)
 Patient with diagnosis of PAF on Eliquis for anticoagulation.    Procedure:  Endoscopy/Colonoscopy  Date of procedure: 05/04/23   CHA2DS2-VASc Score = 2   This indicates a 2.2% annual risk of stroke. The patient's score is based upon: CHF History: 0 HTN History: 0 Diabetes History: 0 Stroke History: 0 Vascular Disease History: 0 Age Score: 1 Gender Score: 1       CrCl 78 mL/min (Adj BW) Platelet count 262 K   Per office protocol, patient can hold Eliquis for 2 days prior to procedure.     **This guidance is not considered finalized until pre-operative APP has relayed final recommendations.**

## 2023-04-05 NOTE — Telephone Encounter (Signed)
 Received cardiac clearance via epic advised patient okay to hold Eliquis 2 days prior to procedure. Patient verbalized understanding.

## 2023-04-05 NOTE — Telephone Encounter (Signed)
   Patient Name: Kristin [name]  DOB: 1950/08/01 MRN: [id number]  Primary Cardiologist: None  Clinical pharmacists have reviewed the patient's past medical history, labs, and current medications as part of preoperative protocol coverage. The following recommendations have been made:  Per office protocol, patient can hold Eliquis for 2 days prior to procedure.   I will route this recommendation to the requesting party via Epic fax function and remove from pre-op pool.  Please call with questions.  Denyce Robert, NP 04/05/2023, 10:12 AM

## 2023-04-07 ENCOUNTER — Ambulatory Visit (HOSPITAL_COMMUNITY)
Admission: RE | Admit: 2023-04-07 | Discharge: 2023-04-07 | Disposition: A | Discharge status: Home / Self Care | Source: Ambulatory Visit | Attending: Internal Medicine | Admitting: Internal Medicine

## 2023-04-07 DIAGNOSIS — R194 Change in bowel habit: Secondary | ICD-10-CM | POA: Diagnosis not present

## 2023-04-07 DIAGNOSIS — K5730 Diverticulosis of large intestine without perforation or abscess without bleeding: Secondary | ICD-10-CM | POA: Diagnosis not present

## 2023-04-07 DIAGNOSIS — R1030 Lower abdominal pain, unspecified: Secondary | ICD-10-CM

## 2023-04-07 DIAGNOSIS — K219 Gastro-esophageal reflux disease without esophagitis: Secondary | ICD-10-CM | POA: Diagnosis not present

## 2023-04-07 MED ORDER — IOHEXOL 300 MG/ML  SOLN
100.0000 mL | Freq: Once | INTRAMUSCULAR | Status: AC | PRN
  Administered 2023-04-07: 100 mL via INTRAVENOUS

## 2023-04-10 NOTE — Progress Notes (Signed)
 Remote pacemaker transmission.

## 2023-04-13 ENCOUNTER — Encounter: Payer: Self-pay | Admitting: Internal Medicine

## 2023-04-24 ENCOUNTER — Encounter: Payer: Self-pay | Admitting: Internal Medicine

## 2023-04-26 ENCOUNTER — Encounter: Payer: Self-pay | Admitting: Internal Medicine

## 2023-04-27 ENCOUNTER — Encounter: Admitting: Internal Medicine

## 2023-05-04 ENCOUNTER — Ambulatory Visit: Admitting: Internal Medicine

## 2023-05-04 ENCOUNTER — Encounter: Payer: Self-pay | Admitting: Internal Medicine

## 2023-05-04 VITALS — BP 106/68 | HR 71 | Temp 97.6°F | Resp 11 | Ht 66.0 in | Wt 215.0 lb

## 2023-05-04 DIAGNOSIS — K449 Diaphragmatic hernia without obstruction or gangrene: Secondary | ICD-10-CM | POA: Diagnosis not present

## 2023-05-04 DIAGNOSIS — K219 Gastro-esophageal reflux disease without esophagitis: Secondary | ICD-10-CM | POA: Diagnosis not present

## 2023-05-04 DIAGNOSIS — R194 Change in bowel habit: Secondary | ICD-10-CM | POA: Diagnosis not present

## 2023-05-04 DIAGNOSIS — E039 Hypothyroidism, unspecified: Secondary | ICD-10-CM | POA: Diagnosis not present

## 2023-05-04 DIAGNOSIS — G4730 Sleep apnea, unspecified: Secondary | ICD-10-CM | POA: Diagnosis not present

## 2023-05-04 DIAGNOSIS — I4892 Unspecified atrial flutter: Secondary | ICD-10-CM | POA: Diagnosis not present

## 2023-05-04 DIAGNOSIS — D122 Benign neoplasm of ascending colon: Secondary | ICD-10-CM

## 2023-05-04 DIAGNOSIS — D123 Benign neoplasm of transverse colon: Secondary | ICD-10-CM | POA: Diagnosis not present

## 2023-05-04 DIAGNOSIS — K648 Other hemorrhoids: Secondary | ICD-10-CM | POA: Diagnosis not present

## 2023-05-04 DIAGNOSIS — K5730 Diverticulosis of large intestine without perforation or abscess without bleeding: Secondary | ICD-10-CM

## 2023-05-04 DIAGNOSIS — I4891 Unspecified atrial fibrillation: Secondary | ICD-10-CM | POA: Diagnosis not present

## 2023-05-04 DIAGNOSIS — K635 Polyp of colon: Secondary | ICD-10-CM | POA: Diagnosis not present

## 2023-05-04 DIAGNOSIS — K317 Polyp of stomach and duodenum: Secondary | ICD-10-CM

## 2023-05-04 DIAGNOSIS — R1030 Lower abdominal pain, unspecified: Secondary | ICD-10-CM

## 2023-05-04 MED ORDER — SODIUM CHLORIDE 0.9 % IV SOLN: 500.0000 mL | Freq: Once | INTRAVENOUS | Status: DC

## 2023-05-04 MED ORDER — DICYCLOMINE HCL 10 MG PO CAPS: ORAL_CAPSULE | ORAL | 3 refills | Status: AC

## 2023-05-04 NOTE — Progress Notes (Signed)
 GASTROENTEROLOGY PROCEDURE H&P NOTE   Primary Care Physician: Willow Ora, MD    Reason for Procedure:   Lower abdominal pain, change in bowel habits, GERD  Plan:    EGD/colonoscopy  Patient is appropriate for endoscopic procedure(s) in the ambulatory ([facility]) setting.  The nature of the procedure, as well as the risks, benefits, and alternatives were carefully and thoroughly reviewed with the patient. Ample time for discussion and questions allowed. The patient understood, was satisfied, and agreed to proceed.     HPI: Kristin [name] is a 73 y.o. female who presents for EGD/colonoscopy for evaluation of lower ab pain, change in bowel habits, GERD.  Patient was most recently seen in the Gastroenterology Clinic on 04/04/23.  No interval change in medical history since that appointment. Please refer to that note for full details regarding GI history and clinical presentation.   Past Medical History:  Diagnosis Date   Anemia    AR (allergic rhinitis)    Atrial fibrillation (HCC)    Atrial flutter (HCC)    Clotting disorder (HCC)    pt denies   Diverticulosis    Esophageal ulcer    due to Motrin   GERD (gastroesophageal reflux disease)    Hiatal hernia    HLD (hyperlipidemia)    Hypothyroidism    IBS (irritable bowel syndrome)    Migraines    Mild sleep apnea    Osteoarthritis    Osteopenia after menopause 06/14/2017   DEXA 08/2016 [facility]: T = -1.10 radius, -1.0 femur; lumbar spine elevated due to DJD; recheck 2-3 years.   Pacemaker    CHB   Paroxysmal A-fib (HCC)    PUD (peptic ulcer disease)    Sleep apnea    Thyroid nodule    Transient complete heart block 1996    Past Surgical History:  Procedure Laterality Date   ATRIAL FIBRILLATION ABLATION N/A 01/08/2019   Procedure: ATRIAL FIBRILLATION ABLATION;  Surgeon: Hillis Range, MD;  Location: [facility];  Service: Cardiovascular;  Laterality: N/A;   ATRIAL FIBRILLATION ABLATION N/A 04/02/2019    Procedure: ATRIAL FIBRILLATION ABLATION;  Surgeon: Hillis Range, MD;  Location: [facility];  Service: Cardiovascular;  Laterality: N/A;   CARDIOVERSION N/A 01/28/2019   Procedure: CARDIOVERSION;  Surgeon: Little Ishikawa, MD;  Location: [facility] ENDOSCOPY;  Service: Endoscopy;  Laterality: N/A;   CARDIOVERSION N/A 02/27/2019   Procedure: CARDIOVERSION;  Surgeon: Little Ishikawa, MD;  Location: [facility] ENDOSCOPY;  Service: Endoscopy;  Laterality: N/A;   COLONOSCOPY     HIP ARTHROPLASTY Right 2008   PACEMAKER GENERATOR CHANGE  03/01/2007   performed by Dr Sharol Harness at [facility] ([employer] [facility])   PACEMAKER INSERTION  09/08/1994   performed in [facility] for transient complete heart block   PPM GENERATOR CHANGEOUT N/A 06/04/2020   Procedure: PPM GENERATOR CHANGEOUT;  Surgeon: Hillis Range, MD;  Location: [facility] CV LAB;  Service: Cardiovascular;  Laterality: N/A;   THYROIDECTOMY, PARTIAL  1986   BENIGN NODULES   TOTAL HIP ARTHROPLASTY Left 02/13/2018   Procedure: TOTAL HIP ARTHROPLASTY ANTERIOR APPROACH;  Surgeon: Durene Romans, MD;  Location: [facility] ORS;  Service: Orthopedics;  Laterality: Left;  70 mins   TOTAL HIP REVISION Right 02/10/2022   Procedure: TOTAL HIP REVISION;  Surgeon: Durene Romans, MD;  Location: [facility] ORS;  Service: Orthopedics;  Laterality: Right;   TUBAL LIGATION  1987    Prior to Admission medications   Medication Sig Start Date End Date Taking? Authorizing Provider  amoxicillin (AMOXIL) 500 MG capsule Take by mouth as directed. Take 4 caps by mouth 1 hour prior to dental procedures    [provider]  Calcium Carbonate (CALCIUM 500 PO) Take 500 mg by mouth daily.    [provider]  Cholecalciferol (VITAMIN D3) 25 MCG (1000 UT) CAPS Take 1,000 Units by mouth daily.    [provider]  Cyanocobalamin (B12 LIQUID HEALTH BOOSTER PO) Take 1,000 mcg by mouth every Friday.    [provider]  diltiazem (CARDIZEM CD) 120 MG 24 hr  capsule TAKE ONE CAPSULE TWICE DAILY. MAY TAKE EXTRA CAPSULE DAILY FOR BREAKTHROUGH AFIB. 11/21/22   Marinus Maw, MD  dofetilide (TIKOSYN) 500 MCG capsule TAKE ONE CAPSULE BY MOUTH TWICE DAILY 11/21/22   Marinus Maw, MD  ELIQUIS 5 MG TABS tablet TAKE 1 TABLET TWICE DAILY 02/14/23   Marinus Maw, MD  gabapentin (NEURONTIN) 100 MG capsule Take 100 mg by mouth 3 (three) times daily as needed.    [provider]  levothyroxine (SYNTHROID) 112 MCG tablet TAKE ONE TABLET BY MOUTH DAILY 11/21/22   Willow Ora, MD  Magnesium 250 MG TABS Take 250 mg by mouth every evening.     [provider]  omeprazole (PRILOSEC) 20 MG capsule Take 1 capsule (20 mg total) by mouth daily. 08/22/22   Willow Ora, MD  polyethylene glycol (MIRALAX / GLYCOLAX) 17 g packet Take 17 g by mouth 2 (two) times daily. 02/11/22   Cassandria Anger, PA-C  Potassium 99 MG TABS Take 99 mg by mouth daily.     [provider]  sodium chloride (OCEAN) 0.65 % SOLN nasal spray Place 1 spray into both nostrils 2 (two) times daily.    [provider]  traMADol (ULTRAM) 50 MG tablet Take 1 tablet (50 mg total) by mouth daily as needed for moderate pain (pain score 4-6). 11/23/22   Willow Ora, MD  vitamin C (ASCORBIC ACID) 500 MG tablet Take 500 mg by mouth daily.    [provider]    Current Outpatient Medications  Medication Sig Dispense Refill   amoxicillin (AMOXIL) 500 MG capsule Take by mouth as directed. Take 4 caps by mouth 1 hour prior to dental procedures     Calcium Carbonate (CALCIUM 500 PO) Take 500 mg by mouth daily.     Cholecalciferol (VITAMIN D3) 25 MCG (1000 UT) CAPS Take 1,000 Units by mouth daily.     Cyanocobalamin (B12 LIQUID HEALTH BOOSTER PO) Take 1,000 mcg by mouth every Friday.     diltiazem (CARDIZEM CD) 120 MG 24 hr capsule TAKE ONE CAPSULE TWICE DAILY. MAY TAKE EXTRA CAPSULE DAILY FOR BREAKTHROUGH AFIB. 225 capsule 3   dofetilide (TIKOSYN) 500 MCG  capsule TAKE ONE CAPSULE BY MOUTH TWICE DAILY 180 capsule 3   ELIQUIS 5 MG TABS tablet TAKE 1 TABLET TWICE DAILY 180 tablet 3   gabapentin (NEURONTIN) 100 MG capsule Take 100 mg by mouth 3 (three) times daily as needed.     levothyroxine (SYNTHROID) 112 MCG tablet TAKE ONE TABLET BY MOUTH DAILY 90 tablet 3   Magnesium 250 MG TABS Take 250 mg by mouth every evening.      omeprazole (PRILOSEC) 20 MG capsule Take 1 capsule (20 mg total) by mouth daily. 90 capsule 3   polyethylene glycol (MIRALAX / GLYCOLAX) 17 g packet Take 17 g by mouth 2 (two) times daily. 14 each 0   Potassium 99 MG TABS Take  99 mg by mouth daily.      sodium chloride (OCEAN) 0.65 % SOLN nasal spray Place 1 spray into both nostrils 2 (two) times daily.     traMADol (ULTRAM) 50 MG tablet Take 1 tablet (50 mg total) by mouth daily as needed for moderate pain (pain score 4-6). 30 tablet 1   vitamin C (ASCORBIC ACID) 500 MG tablet Take 500 mg by mouth daily.     No current facility-administered medications for this visit.    Allergies as of 05/04/2023 - Review Complete 05/04/2023  Allergen Reaction Noted   Neomycin Itching 01/08/2013   Adhesive [tape] Rash and Other (See Comments) 10/06/2015   Morphine Hives and Itching 10/14/2014    Family History  Problem Relation Age of Onset   Rheum arthritis Mother    Peptic Ulcer Mother    COPD Brother    Drug abuse Brother    Cancer Maternal Grandmother    Uterine cancer Paternal Grandmother    Diabetes Mellitus II Maternal Aunt    Breast cancer Neg Hx     Social History   Socioeconomic History   Marital status: Married    Spouse name: Not on file   Number of children: 0   Years of education: Not on file   Highest education level: Not on file  Occupational History   Occupation: retired  Tobacco Use   Smoking status: Never    Passive exposure: Past   Smokeless tobacco: Never  Vaping Use   Vaping status: Never Used  Substance and Sexual Activity   Alcohol use:  Never    Alcohol/week: 1.0 standard drink of alcohol    Types: 1 Glasses of wine per week   Drug use: No   Sexual activity: Not Currently  Other Topics Concern   Not on file  Social History Narrative   [employer]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   Married, no children   Social Drivers of Corporate investment banker Strain: Low Risk  (10/04/2022)   Overall Financial Resource Strain (CARDIA)    Difficulty of Paying Living Expenses: Not hard at all  Food Insecurity: No Food Insecurity (10/04/2022)   Hunger Vital Sign    Worried About Running Out of Food in the Last Year: Never true    Ran Out of Food in the Last Year: Never true  Transportation Needs: No Transportation Needs (10/04/2022)   PRAPARE - Administrator, Civil Service (Medical): No    Lack of Transportation (Non-Medical): No  Physical Activity: Sufficiently Active (10/04/2022)   Exercise Vital Sign    Days of Exercise per Week: 5 days    Minutes of Exercise per Session: 90 min  Stress: No Stress Concern Present (10/04/2022)   [employer] of Occupational Health - Occupational Stress Questionnaire    Feeling of Stress : Not at all  Social Connections: Moderately Integrated (10/04/2022)   Social Connection and Isolation Panel [NHANES]    Frequency of Communication with Friends and Family: More than three times a week    Frequency of Social Gatherings with Friends and Family: Once a week    Attends Religious Services: Never    Database administrator or Organizations: Yes    Attends Banker Meetings: 1 to 4 times per year    Marital Status: Married  Catering manager Violence: Not At Risk (10/04/2022)   Humiliation, Afraid, Rape, and Kick questionnaire    Fear of Current or Ex-Partner: No  Emotionally Abused: No    Physically Abused: No    Sexually Abused: No    Physical Exam: Vital signs in last 24 hours: BP 132/76   Pulse 78   Temp 97.6 F (36.4 C) (Temporal)   Ht 5\' 6"  (1.676 m)    Wt 215 lb (97.5 kg)   SpO2 97%   BMI 34.70 kg/m  GEN: NAD EYE: Sclerae anicteric ENT: MMM CV: Non-tachycardic Pulm: No increased WOB GI: Soft NEURO:  Alert & Oriented   Eulah Pont, MD [facility]   05/04/2023 8:09 AM

## 2023-05-04 NOTE — Progress Notes (Signed)
 OV F/U 2-3 months Bentyl 10 mg TID PRN #30, 3 refill V.O. Dr. Bobette Mo RN

## 2023-05-04 NOTE — Op Note (Signed)
 [facility] Patient Name: Kristin [name] Procedure Date: 05/04/2023 8:47 AM MRN: [id number] Endoscopist: Madelyn Brunner [place] , , [id number] Age: 73 Referring MD:  Date of Birth: Aug 04, 1950 Gender: Female Account #: [id number] Procedure:                Upper GI endoscopy Indications:              Heartburn Medicines:                Monitored Anesthesia Care Procedure:                Pre-Anesthesia Assessment:                           - Prior to the procedure, a History and Physical                            was performed, and patient medications and                            allergies were reviewed. The patient's tolerance of                            previous anesthesia was also reviewed. The risks                            and benefits of the procedure and the sedation                            options and risks were discussed with the patient.                            All questions were answered, and informed consent                            was obtained. Prior Anticoagulants: The patient has                            taken Eliquis (apixaban), last dose was 3 days                            prior to procedure. ASA Grade Assessment: III - A                            patient with severe systemic disease. After                            reviewing the risks and benefits, the patient was                            deemed in satisfactory condition to undergo the                            procedure.  After obtaining informed consent, the endoscope was                            passed under direct vision. Throughout the                            procedure, the patient's blood pressure, pulse, and                            oxygen saturations were monitored continuously. The                            GIF [id number] #[id number] was introduced through the                            mouth, and advanced to the second part of duodenum.                             The upper GI endoscopy was accomplished without                            difficulty. The patient tolerated the procedure                            well. Scope In: Scope Out: Findings:                 The examined esophagus was normal.                           A small hiatal hernia was present.                           Three 5 to 10 mm sessile polyps with no bleeding                            and no stigmata of recent bleeding were found in                            the gastric fundus and in the gastric body. These                            polyps were removed with a cold snare. Resection                            and retrieval were complete.                           The examined duodenum was normal. Complications:            No immediate complications. Estimated Blood Loss:     Estimated blood loss was minimal. Impression:               - Normal esophagus.                           -  Small hiatal hernia.                           - Three gastric polyps. Resected and retrieved.                           - Normal examined duodenum. Recommendation:           - Await pathology results.                           - Perform a colonoscopy today. Dr Particia Lather "Alan Ripper" [place],  05/04/2023 9:28:36 AM

## 2023-05-04 NOTE — Progress Notes (Signed)
 Called to room to assist during endoscopic procedure.  Patient ID and intended procedure confirmed with present staff. Received instructions for my participation in the procedure from the performing physician.

## 2023-05-04 NOTE — Op Note (Addendum)
 [facility] Patient Name: Kristin [name] Procedure Date: 05/04/2023 8:43 AM MRN: [id number] Endoscopist: Kristin [name] Kristin [name] , , [id number] Age: 73 Referring MD:  Date of Birth: [birth year] Gender: Female Account #: [id number] Procedure:                Colonoscopy Indications:              Change in bowel habits Medicines:                Monitored Anesthesia Care Procedure:                Pre-Anesthesia Assessment:                           - Prior to the procedure, a History and Physical                            was performed, and patient medications and                            allergies were reviewed. The patient's tolerance of                            previous anesthesia was also reviewed. The risks                            and benefits of the procedure and the sedation                            options and risks were discussed with the patient.                            All questions were answered, and informed consent                            was obtained. Prior Anticoagulants: The patient has                            taken Eliquis (apixaban), last dose was 3 days                            prior to procedure. ASA Grade Assessment: III - A                            patient with severe systemic disease. After                            reviewing the risks and benefits, the patient was                            deemed in satisfactory condition to undergo the                            procedure.  After obtaining informed consent, the colonoscope                            was passed under direct vision. Throughout the                            procedure, the patient's blood pressure, pulse, and                            oxygen saturations were monitored continuously. The                            Olympus Scope SN [id number] was introduced through the                            anus and advanced to the the terminal ileum. The                             colonoscopy was performed without difficulty. The                            patient tolerated the procedure well. The quality                            of the bowel preparation was good. The terminal                            ileum, ileocecal valve, appendiceal orifice, and                            rectum were photographed. Scope In: 9:06:42 AM Scope Out: 9:24:11 AM Scope Withdrawal Time: 0 hours 12 minutes 23 seconds  Total Procedure Duration: 0 hours 17 minutes 29 seconds  Findings:                 The terminal ileum appeared normal.                           Three sessile polyps were found in the transverse                            colon and ascending colon. The polyps were 3 to 8                            mm in size. These polyps were removed with a cold                            snare. Resection and retrieval were complete.                           Multiple diverticula were found in the sigmoid                            colon and descending colon.  Non-bleeding internal hemorrhoids were found during                            retroflexion.                           Biopsies for histology were taken with a cold                            forceps from the entire colon for evaluation of                            microscopic colitis. Complications:            No immediate complications. Estimated Blood Loss:     Estimated blood loss was minimal. Impression:               - The examined portion of the ileum was normal.                           - Three 3 to 8 mm polyps in the transverse colon                            and in the ascending colon, removed with a cold                            snare. Resected and retrieved.                           - Diverticulosis in the sigmoid colon and in the                            descending colon.                           - Non-bleeding internal hemorrhoids.                           -  Biopsies were taken with a cold forceps from the                            entire colon for evaluation of microscopic colitis. Recommendation:           - Discharge patient to home (with escort).                           - Await pathology results.                           - Bentyl 10 mg TID PRN for ab pain                           - Return to GI clinic in 2-3 months.                           - Okay to restart your  Eliquis tomorrow.                           - The findings and recommendations were discussed                            with the patient. Dr Kristin [name] "[place]" Kristin [name],  05/04/2023 9:31:12 AM

## 2023-05-04 NOTE — Progress Notes (Signed)
 Pt's states no medical or surgical changes since previsit or office visit.

## 2023-05-04 NOTE — Progress Notes (Signed)
 Vss nad trans to pacu

## 2023-05-04 NOTE — Patient Instructions (Addendum)
 Await pathology results. Okay to restart your Eliquis tomorrow.   YOU HAD AN ENDOSCOPIC PROCEDURE TODAY AT THE [facility]:   Refer to the procedure report that was given to you for any specific questions about what was found during the examination.  If the procedure report does not answer your questions, please call your gastroenterologist to clarify.  If you requested that your care partner not be given the details of your procedure findings, then the procedure report has been included in a sealed envelope for you to review at your convenience later.  YOU SHOULD EXPECT: Some feelings of bloating in the abdomen. Passage of more gas than usual.  Walking can help get rid of the air that was put into your GI tract during the procedure and reduce the bloating. If you had a lower endoscopy (such as a colonoscopy or flexible sigmoidoscopy) you may notice spotting of blood in your stool or on the toilet paper. If you underwent a bowel prep for your procedure, you may not have a normal bowel movement for a few days.  Please Note:  You might notice some irritation and congestion in your nose or some drainage.  This is from the oxygen used during your procedure.  There is no need for concern and it should clear up in a day or so.  SYMPTOMS TO REPORT IMMEDIATELY:  Following lower endoscopy (colonoscopy or flexible sigmoidoscopy):  Excessive amounts of blood in the stool  Significant tenderness or worsening of abdominal pains  Swelling of the abdomen that is new, acute  Fever of 100F or higher  Following upper endoscopy (EGD)  Vomiting of blood or coffee ground material  New chest pain or pain under the shoulder blades  Painful or persistently difficult swallowing  New shortness of breath  Fever of 100F or higher  Black, tarry-looking stools  For urgent or emergent issues, a gastroenterologist can be reached at any hour by calling [phone]. Do not use MyChart messaging for urgent  concerns.    DIET:  We do recommend a small meal at first, but then you may proceed to your regular diet.  Drink plenty of fluids but you should avoid alcoholic beverages for 24 hours.  ACTIVITY:  You should plan to take it easy for the rest of today and you should NOT DRIVE or use heavy machinery until tomorrow (because of the sedation medicines used during the test).    FOLLOW UP: Our staff will call the number listed on your records the next business day following your procedure.  We will call around 7:15- 8:00 am to check on you and address any questions or concerns that you may have regarding the information given to you following your procedure. If we do not reach you, we will leave a message.     If any biopsies were taken you will be contacted by phone or by letter within the next 1-3 weeks.  Please call us at [phone] if you have not heard about the biopsies in 3 weeks.    SIGNATURES/CONFIDENTIALITY: You and/or your care partner have signed paperwork which will be entered into your electronic medical record.  These signatures attest to the fact that that the information above on your After Visit Summary has been reviewed and is understood.  Full responsibility of the confidentiality of this discharge information lies with you and/or your care-partner.

## 2023-05-05 ENCOUNTER — Telehealth: Payer: Self-pay

## 2023-05-05 NOTE — Telephone Encounter (Signed)
 No answer after follow up call. Voice message left.

## 2023-05-08 ENCOUNTER — Encounter: Payer: Self-pay | Admitting: Internal Medicine

## 2023-05-08 LAB — SURGICAL PATHOLOGY

## 2023-05-16 DIAGNOSIS — N950 Postmenopausal bleeding: Secondary | ICD-10-CM | POA: Diagnosis not present

## 2023-05-16 DIAGNOSIS — R102 Pelvic and perineal pain: Secondary | ICD-10-CM | POA: Diagnosis not present

## 2023-05-25 DIAGNOSIS — N882 Stricture and stenosis of cervix uteri: Secondary | ICD-10-CM | POA: Diagnosis not present

## 2023-05-25 DIAGNOSIS — N950 Postmenopausal bleeding: Secondary | ICD-10-CM | POA: Diagnosis not present

## 2023-06-02 DIAGNOSIS — N950 Postmenopausal bleeding: Secondary | ICD-10-CM | POA: Diagnosis not present

## 2023-06-02 DIAGNOSIS — N882 Stricture and stenosis of cervix uteri: Secondary | ICD-10-CM | POA: Diagnosis not present

## 2023-06-02 DIAGNOSIS — R102 Pelvic and perineal pain: Secondary | ICD-10-CM | POA: Diagnosis not present

## 2023-06-06 DIAGNOSIS — N859 Noninflammatory disorder of uterus, unspecified: Secondary | ICD-10-CM | POA: Diagnosis not present

## 2023-06-06 DIAGNOSIS — N952 Postmenopausal atrophic vaginitis: Secondary | ICD-10-CM | POA: Diagnosis not present

## 2023-06-06 DIAGNOSIS — N882 Stricture and stenosis of cervix uteri: Secondary | ICD-10-CM | POA: Diagnosis not present

## 2023-06-09 ENCOUNTER — Ambulatory Visit (INDEPENDENT_AMBULATORY_CARE_PROVIDER_SITE_OTHER): Payer: Medicare PPO

## 2023-06-09 DIAGNOSIS — I442 Atrioventricular block, complete: Secondary | ICD-10-CM

## 2023-06-09 LAB — CUP PACEART REMOTE DEVICE CHECK
Battery Remaining Longevity: 150 mo
Battery Voltage: 3.04 V
Brady Statistic AP VP Percent: 0 %
Brady Statistic AP VS Percent: 0 %
Brady Statistic AS VP Percent: 0.01 %
Brady Statistic AS VS Percent: 99.99 %
Brady Statistic RA Percent Paced: 0 %
Brady Statistic RV Percent Paced: 0.01 %
Date Time Interrogation Session: 20250516133152
Implantable Lead Connection Status: 753985
Implantable Lead Connection Status: 753985
Implantable Lead Implant Date: 19960815
Implantable Lead Implant Date: 19960815
Implantable Lead Location: 753859
Implantable Lead Location: 753860
Implantable Lead Model: 5024
Implantable Pulse Generator Implant Date: 20220512
Lead Channel Impedance Value: 323 Ohm
Lead Channel Impedance Value: 399 Ohm
Lead Channel Impedance Value: 456 Ohm
Lead Channel Impedance Value: 475 Ohm
Lead Channel Pacing Threshold Amplitude: 1.5 V
Lead Channel Pacing Threshold Pulse Width: 0.4 ms
Lead Channel Sensing Intrinsic Amplitude: 2.875 mV
Lead Channel Sensing Intrinsic Amplitude: 5.625 mV
Lead Channel Sensing Intrinsic Amplitude: 5.625 mV
Lead Channel Setting Pacing Amplitude: 3.75 V
Lead Channel Setting Pacing Pulse Width: 0.4 ms
Lead Channel Setting Sensing Sensitivity: 0.9 mV
Zone Setting Status: 755011

## 2023-06-12 ENCOUNTER — Ambulatory Visit: Payer: Self-pay | Admitting: Internal Medicine

## 2023-07-04 ENCOUNTER — Ambulatory Visit: Admitting: Internal Medicine

## 2023-07-04 ENCOUNTER — Encounter: Payer: Self-pay | Admitting: Internal Medicine

## 2023-07-04 VITALS — BP 118/70 | HR 76 | Ht 67.0 in | Wt 212.0 lb

## 2023-07-04 DIAGNOSIS — K219 Gastro-esophageal reflux disease without esophagitis: Secondary | ICD-10-CM

## 2023-07-04 DIAGNOSIS — R1030 Lower abdominal pain, unspecified: Secondary | ICD-10-CM | POA: Diagnosis not present

## 2023-07-04 DIAGNOSIS — K449 Diaphragmatic hernia without obstruction or gangrene: Secondary | ICD-10-CM | POA: Diagnosis not present

## 2023-07-04 DIAGNOSIS — Z8719 Personal history of other diseases of the digestive system: Secondary | ICD-10-CM | POA: Diagnosis not present

## 2023-07-04 DIAGNOSIS — R194 Change in bowel habit: Secondary | ICD-10-CM

## 2023-07-04 NOTE — Patient Instructions (Addendum)
 Continue taking Omeprazole  20 mg daily Start taking Sunfiber instead of Benefiber Follow up in 1 year  If your blood pressure at your visit was 140/90 or greater, please contact your primary care physician to follow up on this.  _______________________________________________________  If you are age 72 or older, your body mass index should be between 23-30. Your Body mass index is 33.2 kg/m. If this is out of the aforementioned range listed, please consider follow up with your Primary Care Provider.  If you are age 71 or younger, your body mass index should be between 19-25. Your Body mass index is 33.2 kg/m. If this is out of the aformentioned range listed, please consider follow up with your Primary Care Provider.   ________________________________________________________  The  GI providers would like to encourage you to use MYCHART to communicate with providers for non-urgent requests or questions.  Due to long hold times on the telephone, sending your provider a message by [facility] may be a faster and more efficient way to get a response.  Please allow 48 business hours for a response.  Please remember that this is for non-urgent requests.  _______________________________________________________  Thank you for entrusting me with your care and for choosing [facility], Dr. Regino Caprio

## 2023-07-04 NOTE — Progress Notes (Signed)
 Chief Complaint: Lower ab pain  HPI : 73 year old female with history of A-fib on Eliquis , complete heart block s/p PM, OSA, osteoarthritis presents with lower ab pain  Interval History: She has still had lower abdominal pain. She saw her OB/GYN who suggested that her pain may be related to fluid trapped in the uterus due to a prior conization of her cervix. She has been started on estrogen supplements to see if this will help with her ab pain. Her bowel habits are okay. Sometimes her stools are really small and other times they are fine. She tries to eat salads as well as fresh fruits and vegetables in order to get plenty of fiber. No benefit from dicyclomine  or Miralax . Previously Benefiber would cause her to develop ab pain. She takes the Prilosec daily, which is necessary to keep her acid reflux under control.   Past Medical History:  Diagnosis Date   Anemia    AR (allergic rhinitis)    Atrial fibrillation (HCC) 2018   Atrial flutter (HCC)    Clotting disorder (HCC)    pt denies   Diverticulosis    Esophageal ulcer    due to Motrin   GERD (gastroesophageal reflux disease)    Hiatal hernia    HLD (hyperlipidemia)    Hypothyroidism    IBS (irritable bowel syndrome)    Migraines    Mild sleep apnea    Osteoarthritis    Osteopenia after menopause 06/14/2017   DEXA 08/2016 [facility]: T = -1.10 radius, -1.0 femur; lumbar spine elevated due to DJD; recheck 2-3 years.   Pacemaker    CHB   Paroxysmal A-fib (HCC)    PUD (peptic ulcer disease)    Sleep apnea    Thyroid  nodule    Transient complete heart block 1996     Past Surgical History:  Procedure Laterality Date   ATRIAL FIBRILLATION ABLATION N/A 01/08/2019   Procedure: ATRIAL FIBRILLATION ABLATION;  Surgeon: Jolly Needle, MD;  Location: [facility];  Service: Cardiovascular;  Laterality: N/A;   ATRIAL FIBRILLATION ABLATION N/A 04/02/2019   Procedure: ATRIAL FIBRILLATION ABLATION;  Surgeon: Jolly Needle, MD;   Location: [facility];  Service: Cardiovascular;  Laterality: N/A;   CARDIOVERSION N/A 01/28/2019   Procedure: CARDIOVERSION;  Surgeon: Wendie Hamburg, MD;  Location: [facility] ENDOSCOPY;  Service: Endoscopy;  Laterality: N/A;   CARDIOVERSION N/A 02/27/2019   Procedure: CARDIOVERSION;  Surgeon: Wendie Hamburg, MD;  Location: [facility] ENDOSCOPY;  Service: Endoscopy;  Laterality: N/A;   COLONOSCOPY  2017   [place]   HIP ARTHROPLASTY Right 2008   PACEMAKER GENERATOR CHANGE  03/01/2007   performed by Dr Saralee Cummins at [facility] ([employer] [facility])   PACEMAKER INSERTION  09/08/1994   performed in [place] for transient complete heart block   PPM GENERATOR CHANGEOUT N/A 06/04/2020   Procedure: PPM GENERATOR CHANGEOUT;  Surgeon: Jolly Needle, MD;  Location: [facility];  Service: Cardiovascular;  Laterality: N/A;   THYROIDECTOMY, PARTIAL  1986   BENIGN NODULES   TOTAL HIP ARTHROPLASTY Left 02/13/2018   Procedure: TOTAL HIP ARTHROPLASTY ANTERIOR APPROACH;  Surgeon: Claiborne Crew, MD;  Location: [facility] ORS;  Service: Orthopedics;  Laterality: Left;  70 mins   TOTAL HIP REVISION Right 02/10/2022   Procedure: TOTAL HIP REVISION;  Surgeon: Claiborne Crew, MD;  Location: [facility] OR[facility];  Service: Orthopedics;  Laterality: Right;   TUBAL LIGATION  1987   Family History  Problem Relation Age of Onset   Rheum arthritis  Mother    Peptic Ulcer Mother    COPD Brother    Drug abuse Brother    Diabetes Mellitus II Maternal Aunt    Cancer Maternal Grandmother    Uterine cancer Paternal Grandmother    Breast cancer Neg Hx    Colon cancer Neg Hx    Colon polyps Neg Hx    Esophageal cancer Neg Hx    Rectal cancer Neg Hx    Stomach cancer Neg Hx    Social History   Tobacco Use   Smoking status: Never    Passive exposure: Past   Smokeless tobacco: Never  Vaping Use   Vaping status: Never Used  Substance Use Topics   Alcohol use: Not Currently    Alcohol/week: 1.0 standard drink of  alcohol    Types: 1 Glasses of wine per week    Comment: not after A fib dx   Drug use: Never   Current Outpatient Medications  Medication Sig Dispense Refill   amoxicillin (AMOXIL) 500 MG capsule Take by mouth as directed. Take 4 caps by mouth 1 hour prior to dental procedures     Calcium Carbonate (CALCIUM 500 PO) Take 500 mg by mouth daily.     Cholecalciferol (VITAMIN D3) 25 MCG (1000 UT) CAPS Take 1,000 Units by mouth daily.     Cyanocobalamin  (B12 LIQUID HEALTH BOOSTER PO) Take 1,000 mcg by mouth every Friday.     dicyclomine  (BENTYL ) 10 MG capsule 1 tablet TID PRN 30 capsule 3   diltiazem  (CARDIZEM  CD) 120 MG 24 hr capsule TAKE ONE CAPSULE TWICE DAILY. MAY TAKE EXTRA CAPSULE DAILY FOR BREAKTHROUGH AFIB. 225 capsule 3   dofetilide  (TIKOSYN ) 500 MCG capsule TAKE ONE CAPSULE BY MOUTH TWICE DAILY 180 capsule 3   ELIQUIS  5 MG TABS tablet TAKE 1 TABLET TWICE DAILY 180 tablet 3   gabapentin (NEURONTIN) 100 MG capsule Take 100 mg by mouth 3 (three) times daily as needed.     levothyroxine  (SYNTHROID ) 112 MCG tablet TAKE ONE TABLET BY MOUTH DAILY 90 tablet 3   Magnesium  250 MG TABS Take 250 mg by mouth every evening.      omeprazole  (PRILOSEC) 20 MG capsule Take 1 capsule (20 mg total) by mouth daily. 90 capsule 3   polyethylene glycol (MIRALAX  / GLYCOLAX ) 17 g packet Take 17 g by mouth 2 (two) times daily. 14 each 0   Potassium 99 MG TABS Take 99 mg by mouth daily.      sodium chloride  (OCEAN) 0.65 % SOLN nasal spray Place 1 spray into both nostrils 2 (two) times daily.     traMADol  (ULTRAM ) 50 MG tablet Take 1 tablet (50 mg total) by mouth daily as needed for moderate pain (pain score 4-6). 30 tablet 1   vitamin C (ASCORBIC ACID) 500 MG tablet Take 500 mg by mouth daily.     No current facility-administered medications for this visit.   Allergies  Allergen Reactions   Neomycin Itching   Adhesive [Tape] Rash and Other (See Comments)    "Blistering rash"   Morphine  Hives and Itching     Physical Exam: BP 118/70   Pulse 76   Ht 5\' 7"  (1.702 m)   Wt 212 lb (96.2 kg)   BMI 33.20 kg/m  Constitutional: Pleasant,well-developed, female in no acute distress. HEENT: Normocephalic and atraumatic. Conjunctivae are normal. No scleral icterus. Cardiovascular: Normal rate Pulmonary/chest: Effort normal Abdominal: Soft, nondistended, non-tender Extremities: No edema Neurological: Alert and oriented to person place and time.  Skin: Skin is warm and dry. No rashes noted. Psychiatric: Normal mood and affect. Behavior is normal.  Blood work September 2020 shows normal CBC, normal complete metabolic profile, normal pancreatic enzymes, normal sedimentation rate.  Labs 10/2022: CBC nml. CMP nml. Vit D low at 27. Vit B12 nml. TSH nml. Iron studies nml.   CT scan abdomen pelvis with IV and oral contrast October 2020 indication "left-sided abdominal pain for several months"; findings "sigmoid diverticulosis.  No radiographic evidence of diverticulitis or other acute findings"  CT A/P w/contrast 04/07/23: IMPRESSION: Left colonic diverticulosis, without diverticulitis. No CT findings to account for the patient's lower abdominal pain.   EGD December 2007 Dr. Elsie Halo at [facility] gastroenterology indication "heartburn, dysphagia".  Findings LA grade B esophagitis.  Biopsies were taken.  Mild stenosis was found 34 cm from the incisors.  Hiatal hernia was noted.  She was recommended be on proton pump inhibitor twice daily.  Biopsies showed no H. pylori,, GE junction biopsies showed "ulceration and marked squamous atypia"   Colonoscopy December 2007 Dr. Dean Every gastroenterology, indication average risk screening" nodular mucosa was seen at the base of the cecum these were biopsied.  Pathology report suggested "ulcerated colonic mucosa" comment section showed that this was "strongly suggestive of ischemia" Follow-up office note with Dr. Elsie Halo at [facility] gastroenterology January 2008 he felt that the  area at her cecum was probably related to a lot of Motrin which she had been taking around that time.  EGD December 2017 Dr. Corky Diener in [place].  Indication "dysphagia, peptic stricture, chronic gastric ulcer" findings normal esophagus, normal duodenum, polyp in the gastric body, polyps in the cardia and fundus.  Biopsied.  I do not have the pathology results at the time of this visit.  A letter to the patient was written with regard to the biopsies and it said that the "small polyp we biopsied was not cancerous, as I had expected.  Good news."   Colonoscopy December 2017 Dr. Corky Diener in [place], indication "screening" findings "grade 2 internal hemorrhoids, moderate diverticulosis of the sigmoid colon"   EGD December 2020 Dr. Leonia Raman: Described small hiatal hernia, fundic gland polyps.  The examination was otherwise completely normal.  She was given a trial of dicyclomine  10 mg 3 times daily as needed for abdominal pains.  She was instructed to follow-up in 2 or 3 months.    EGD 05/04/23: - The examined esophagus was normal. - A small hiatal hernia was present. - Three 5 to 10 mm sessile polyps with no bleeding and no stigmata of recent bleeding were found in the gastric fundus and in the gastric body. These polyps were removed with a cold snare. Resection and retrieval were complete. - The examined duodenum was normal. Path: 1. Surgical [P], gastri polyps, polyp (3) :       - FUNDIC GLAND POLYPS.   Colonoscopy 05/04/23: - The terminal ileum appeared normal. - Three sessile polyps were found in the transverse colon and ascending colon. The polyps were 3 to 8 mm in size. These polyps were removed with a cold snare. Resection and retrieval were complete. - Multiple diverticula were found in the sigmoid colon and descending colon. - Non- bleeding internal hemorrhoids were found during retroflexion. - Biopsies for histology were taken with a cold forceps from the entire colon for evaluation of  microscopic colitis. 2. Surgical [P], random colon sites :      - BENIGN COLONIC MUCOSA WITH NO SPECIFIC PATHOLOGIC CHANGES      -  NEGATIVE FOR INCREASED INTRAEPITHELIAL LYMPHOCYTES OR THICKENED SUBEPITHELIAL      COLLAGEN TABLE      - NEGATIVE FOR DYSPLASIA OR MALIGNANCY      3. Surgical [P], colon, transverse and ascending, polyp (3) :      - SESSILE SERRATED POLYP(S).      - NO DYSPLASIA OR MALIGNANCY.      - POLYPOID FRAGMENT OF  BENIGN COLONIC MUCOSA WITH LYMPHOID AGGREGATE   ASSESSMENT AND PLAN: Lower ab pain Change in bowel habits Alternating constipation and loose stools GERD Small hiatal hernia History of esophagitis and esophageal ulcer Patient has continued to have issues with ab pain. She has not responded to Miralax  or Bentyl  in the past. Her prior CT scan and colonoscopy did not reveal any clear source ab pain. Will have her try a low FODMAP diet as well as Sunfiber to see if this helps with her symptoms. She is working with her OB/GYN primarily at this time on the abdominal pain since it is thought that fluid accumulation in the uterus may be an explanation for her symptoms, and she is currently on estrogen supplements. Omeprazole  seems to be keeping her reflux symptoms under good control. - Will give low FODMAP diet  - Start Sunfiber instead of Benefiber - Cont omeprazole  20 mg every day - Next colonoscopy due in 04/2026 for polyp surveillance - RTC 1 year  Regino Caprio, MD  I spent 25 minutes of time, including in depth chart review, independent review of results as outlined above, communicating results with the patient directly, face-to-face time with the patient, coordinating care, ordering studies and medications as appropriate, and documentation.

## 2023-07-17 NOTE — Progress Notes (Signed)
 Remote pacemaker transmission.

## 2023-08-08 DIAGNOSIS — R102 Pelvic and perineal pain: Secondary | ICD-10-CM | POA: Diagnosis not present

## 2023-08-08 DIAGNOSIS — N952 Postmenopausal atrophic vaginitis: Secondary | ICD-10-CM | POA: Diagnosis not present

## 2023-08-08 DIAGNOSIS — N882 Stricture and stenosis of cervix uteri: Secondary | ICD-10-CM | POA: Diagnosis not present

## 2023-08-17 ENCOUNTER — Other Ambulatory Visit: Payer: Self-pay | Admitting: Family Medicine

## 2023-08-29 DIAGNOSIS — H2513 Age-related nuclear cataract, bilateral: Secondary | ICD-10-CM | POA: Diagnosis not present

## 2023-09-08 ENCOUNTER — Ambulatory Visit (INDEPENDENT_AMBULATORY_CARE_PROVIDER_SITE_OTHER): Payer: Medicare PPO

## 2023-09-08 DIAGNOSIS — I442 Atrioventricular block, complete: Secondary | ICD-10-CM

## 2023-09-11 LAB — CUP PACEART REMOTE DEVICE CHECK
Battery Remaining Longevity: 147 mo
Battery Voltage: 3.03 V
Brady Statistic AP VP Percent: 0 %
Brady Statistic AP VS Percent: 0 %
Brady Statistic AS VP Percent: 0.01 %
Brady Statistic AS VS Percent: 99.99 %
Brady Statistic RA Percent Paced: 0 %
Brady Statistic RV Percent Paced: 0.01 %
Date Time Interrogation Session: 20250815081443
Implantable Lead Connection Status: 753985
Implantable Lead Connection Status: 753985
Implantable Lead Implant Date: 19960815
Implantable Lead Implant Date: 19960815
Implantable Lead Location: 753859
Implantable Lead Location: 753860
Implantable Lead Model: 5024
Implantable Pulse Generator Implant Date: 20220512
Lead Channel Impedance Value: 323 Ohm
Lead Channel Impedance Value: 399 Ohm
Lead Channel Impedance Value: 456 Ohm
Lead Channel Impedance Value: 475 Ohm
Lead Channel Pacing Threshold Amplitude: 1.625 V
Lead Channel Pacing Threshold Pulse Width: 0.4 ms
Lead Channel Sensing Intrinsic Amplitude: 2.875 mV
Lead Channel Sensing Intrinsic Amplitude: 6.5 mV
Lead Channel Sensing Intrinsic Amplitude: 6.5 mV
Lead Channel Setting Pacing Amplitude: 3.25 V
Lead Channel Setting Pacing Pulse Width: 0.4 ms
Lead Channel Setting Sensing Sensitivity: 0.9 mV
Zone Setting Status: 755011

## 2023-09-12 ENCOUNTER — Ambulatory Visit: Payer: Self-pay | Admitting: Internal Medicine

## 2023-09-21 ENCOUNTER — Encounter (HOSPITAL_BASED_OUTPATIENT_CLINIC_OR_DEPARTMENT_OTHER): Payer: Self-pay | Admitting: Emergency Medicine

## 2023-09-21 ENCOUNTER — Emergency Department (HOSPITAL_BASED_OUTPATIENT_CLINIC_OR_DEPARTMENT_OTHER)
Admission: EM | Admit: 2023-09-21 | Discharge: 2023-09-21 | Disposition: A | Discharge status: Home / Self Care | Source: Ambulatory Visit | Attending: Emergency Medicine | Admitting: Emergency Medicine

## 2023-09-21 ENCOUNTER — Telehealth (HOSPITAL_COMMUNITY): Payer: Self-pay | Admitting: *Deleted

## 2023-09-21 ENCOUNTER — Emergency Department (HOSPITAL_BASED_OUTPATIENT_CLINIC_OR_DEPARTMENT_OTHER)

## 2023-09-21 ENCOUNTER — Other Ambulatory Visit: Payer: Self-pay

## 2023-09-21 DIAGNOSIS — W108XXA Fall (on) (from) other stairs and steps, initial encounter: Secondary | ICD-10-CM | POA: Insufficient documentation

## 2023-09-21 DIAGNOSIS — S0990XA Unspecified injury of head, initial encounter: Secondary | ICD-10-CM | POA: Diagnosis not present

## 2023-09-21 DIAGNOSIS — R519 Headache, unspecified: Secondary | ICD-10-CM | POA: Diagnosis not present

## 2023-09-21 DIAGNOSIS — Z7901 Long term (current) use of anticoagulants: Secondary | ICD-10-CM | POA: Diagnosis not present

## 2023-09-21 NOTE — ED Provider Notes (Signed)
 [place] EMERGENCY DEPARTMENT AT [facility] Provider Note   CSN: [id number] Arrival date & time: 09/21/23  1034     Patient presents with: Kristin [name] is a 73 y.o. female.   Patient here with headache after fall yesterday she is on Eliquis .  Held her Eliquis  last night.  Continues with headache today thought she should get evaluated.  No neck pain.  No loss of consciousness.  Mechanical fall.  Has history of A-fib.  Denies any weakness numbness tingling.  No pain elsewhere.  The history is provided by the patient.       Prior to Admission medications   Medication Sig Start Date End Date Taking? Authorizing Provider  Estradiol 10 MCG TABS vaginal tablet Place 1 tablet vaginally 2 (two) times a week. 08/22/23  Yes [provider]  amoxicillin (AMOXIL) 500 MG capsule Take by mouth as directed. Take 4 caps by mouth 1 hour prior to dental procedures    [provider]  Calcium Carbonate (CALCIUM 500 PO) Take 500 mg by mouth daily.    [provider]  Cholecalciferol (VITAMIN D3) 25 MCG (1000 UT) CAPS Take 1,000 Units by mouth daily.    [provider]  Cyanocobalamin  (B12 LIQUID HEALTH BOOSTER PO) Take 1,000 mcg by mouth every Friday.    [provider]  dicyclomine  (BENTYL ) 10 MG capsule 1 tablet TID PRN 05/04/23   Dorsey, Ying C, MD  diltiazem  (CARDIZEM  CD) 120 MG 24 hr capsule TAKE ONE CAPSULE TWICE DAILY. MAY TAKE EXTRA CAPSULE DAILY FOR BREAKTHROUGH AFIB. 11/21/22   Waddell Danelle ORN, MD  dofetilide  (TIKOSYN ) 500 MCG capsule TAKE ONE CAPSULE BY MOUTH TWICE DAILY 11/21/22   Waddell Danelle ORN, MD  ELIQUIS  5 MG TABS tablet TAKE 1 TABLET TWICE DAILY 02/14/23   Waddell Danelle ORN, MD  gabapentin (NEURONTIN) 100 MG capsule Take 100 mg by mouth 3 (three) times daily as needed.    [provider]  levothyroxine  (SYNTHROID ) 112 MCG tablet TAKE ONE TABLET BY MOUTH DAILY 11/21/22   Jodie Lavern CROME, MD  Magnesium  250 MG TABS Take  250 mg by mouth every evening.     [provider]  omeprazole  (PRILOSEC) 20 MG capsule TAKE ONE CAPSULE BY MOUTH DAILY 08/17/23   Jodie Lavern CROME, MD  polyethylene glycol (MIRALAX  / GLYCOLAX ) 17 g packet Take 17 g by mouth 2 (two) times daily. 02/11/22   Patti Rosina SAUNDERS, PA-C  Potassium 99 MG TABS Take 99 mg by mouth daily.     [provider]  sodium chloride  (OCEAN) 0.65 % SOLN nasal spray Place 1 spray into both nostrils 2 (two) times daily.    [provider]  traMADol  (ULTRAM ) 50 MG tablet Take 1 tablet (50 mg total) by mouth daily as needed for moderate pain (pain score 4-6). 11/23/22   Jodie Lavern CROME, MD  vitamin C (ASCORBIC ACID) 500 MG tablet Take 500 mg by mouth daily.    [provider]    Allergies: Neomycin, Adhesive [tape], and Morphine     Review of Systems  Updated Vital Signs BP 136/79 (BP Location: Right Arm)   Pulse 74   Temp 98.7 F (37.1 C)   Resp 17   Ht 5' 7 (1.702 m)   Wt 87.5 kg   SpO2 98%   BMI 30.23 kg/m   Physical Exam Vitals and nursing note reviewed.  Constitutional:      General: She is not in acute distress.  Appearance: She is well-developed. She is not ill-appearing.  HENT:     Head: Normocephalic and atraumatic.     Nose: Nose normal.     Mouth/Throat:     Mouth: Mucous membranes are moist.  Eyes:     Extraocular Movements: Extraocular movements intact.     Conjunctiva/sclera: Conjunctivae normal.     Pupils: Pupils are equal, round, and reactive to light.  Cardiovascular:     Rate and Rhythm: Normal rate and regular rhythm.     Pulses: Normal pulses.     Heart sounds: Normal heart sounds. No murmur heard. Pulmonary:     Effort: Pulmonary effort is normal. No respiratory distress.     Breath sounds: Normal breath sounds.  Abdominal:     General: Abdomen is flat.     Palpations: Abdomen is soft.     Tenderness: There is no abdominal tenderness.  Musculoskeletal:        General: No swelling.      Cervical back: Normal range of motion and neck supple.     Comments: No midline spinal pain  Skin:    General: Skin is warm and dry.     Capillary Refill: Capillary refill takes less than 2 seconds.  Neurological:     General: No focal deficit present.     Mental Status: She is alert and oriented to person, place, and time.     Cranial Nerves: No cranial nerve deficit.     Sensory: No sensory deficit.     Motor: No weakness.     Coordination: Coordination normal.  Psychiatric:        Mood and Affect: Mood normal.     (all labs ordered are listed, but only abnormal results are displayed) Labs Reviewed - No data to display  EKG: None  Radiology: CT Head Wo Contrast Result Date: 09/21/2023 CLINICAL DATA:  Provided history: Polytrauma, blunt. Additional provided: Fall (hitting posterior head). Headache. EXAM: CT HEAD WITHOUT CONTRAST TECHNIQUE: Contiguous axial images were obtained from the base of the skull through the vertex without intravenous contrast. RADIATION DOSE REDUCTION: This exam was performed according to the departmental dose-optimization program which includes automated exposure control, adjustment of the mA and/or kV according to patient size and/or use of iterative reconstruction technique. COMPARISON:  Noncontrast head CT and CTA head 12/23/2016. FINDINGS: Brain: Mild cerebral atrophy. There is no acute intracranial hemorrhage. No demarcated cortical infarct. No extra-axial fluid collection. No evidence of an intracranial mass. No midline shift. Vascular: No hyperdense vessel.  Atherosclerotic calcifications. Skull: No calvarial fracture or aggressive osseous lesion. Visible sinuses/orbits: No mass or acute finding within the imaged orbits. Trace mucosal thickening within the left sphenoid sinus. IMPRESSION: 1.  No evidence of an acute intracranial abnormality. 2. Mild cerebral atrophy. Electronically Signed   By: Rockey Childs D.O.   On: 09/21/2023 12:22     Procedures    Medications Ordered in the ED - No data to display                                  Medical Decision Making Amount and/or Complexity of Data Reviewed Radiology: ordered.   Sekai Nayak Milling is here with headache after mechanical fall yesterday.  Hit the back of her head.  This occurred at 430 yesterday.  She held her Eliquis  dose last night.  Normal vitals.  No fever.  Will get a head CT.  She is  neurologically intact.  She has no midline spinal tenderness.  Overall she is very well-appearing.  Differential diagnosis likely mild concussion process or contusion but will rule out head bleed with head CT.  Head CT radiology report is unremarkable.  Recommend continued use of Tylenol  at home.  Follow-up with primary care if needed.  Discharged in good condition.  This chart was dictated using voice recognition software.  Despite best efforts to proofread,  errors can occur which can change the documentation meaning.      Final diagnoses:  Injury of head, initial encounter    ED Discharge Orders     None          Ruthe Cornet, DO 09/21/23 1226

## 2023-09-21 NOTE — ED Triage Notes (Signed)
 Pt endorses mechanical fall backwards yesterday from steps, hit posterior head. Takes eliquis . Denies loc or n/v, endorses HA today, tylenol  pta

## 2023-09-21 NOTE — Telephone Encounter (Signed)
 Patient called stating she fell yesterday afternoon striking back of head on concrete. She did not seek medical care - pt states her husband is a physician and has been monitoring her.  Instructed patient since on Eliquis  she should proceed to closest ER for assessment. Pt verbalized understanding.

## 2023-09-28 ENCOUNTER — Telehealth: Payer: Self-pay

## 2023-09-28 NOTE — Telephone Encounter (Signed)
 Transition Care Management Unsuccessful Follow-up Telephone Call  Date of discharge and from where:  09/21/23 [facility]  Attempts:  1st Attempt  Reason for unsuccessful TCM follow-up call:  Left voice message   Pt advised at ED follow up with PCP if needed advised office cb number to return call to schedule visit if needed

## 2023-09-29 ENCOUNTER — Telehealth: Payer: Self-pay

## 2023-09-29 NOTE — Telephone Encounter (Signed)
 Copied from CRM [id number]. Topic: Appointments - Appointment Scheduling >> Sep 28, 2023  3:36 PM [place] J wrote: Patient calling to return a callback from [facility] in regards to a hospital follow up needed. Patient does not want to schedule a follow up at this time and states that everything is okay as of now.  Pt messaged back and stated per Pt:  Patient does not want to schedule a follow up at this time and states that everything is okay as of now.

## 2023-10-06 ENCOUNTER — Encounter: Admitting: Internal Medicine

## 2023-10-12 ENCOUNTER — Ambulatory Visit (INDEPENDENT_AMBULATORY_CARE_PROVIDER_SITE_OTHER): Payer: Medicare PPO

## 2023-10-12 VITALS — Ht 67.0 in | Wt 188.0 lb

## 2023-10-12 DIAGNOSIS — Z0000 Encounter for general adult medical examination without abnormal findings: Secondary | ICD-10-CM | POA: Diagnosis not present

## 2023-10-12 DIAGNOSIS — Z1231 Encounter for screening mammogram for malignant neoplasm of breast: Secondary | ICD-10-CM

## 2023-10-12 NOTE — Progress Notes (Signed)
 Subjective:   Kristin [name] is a 73 y.o. who presents for a Medicare Wellness preventive visit.  As a reminder, Annual Wellness Visits don't include a physical exam, and some assessments may be limited, especially if this visit is performed virtually. We may recommend an in-person follow-up visit with your provider if needed.  Visit Complete: Virtual I connected with  Kristin [name] on 10/12/23 by a audio enabled telemedicine application and verified that I am speaking with the correct person using two identifiers.  Patient Location: Home  Provider Location: Office/Clinic  I discussed the limitations of evaluation and management by telemedicine. The patient expressed understanding and agreed to proceed.  Vital Signs: Because this visit was a virtual/telehealth visit, some criteria may be missing or patient reported. Any vitals not documented were not able to be obtained and vitals that have been documented are patient reported.  VideoDeclined- This patient declined Librarian, academic. Therefore the visit was completed with audio only.  Persons Participating in Visit: Patient.  AWV Questionnaire: No: Patient Medicare AWV questionnaire was not completed prior to this visit.  Cardiac Risk Factors include: advanced age (>73men, >95 women)     Objective:    Today's Vitals   10/12/23 0922  Weight: 188 lb (85.3 kg)  Height: 5' 7 (1.702 m)   Body mass index is 29.44 kg/m.     10/12/2023    9:28 AM 09/21/2023   10:51 AM 10/04/2022   11:12 AM 02/10/2022    3:05 PM 02/10/2022    2:59 PM 02/08/2022    1:57 PM 09/28/2021   11:57 AM  Advanced Directives  Does Patient Have a Medical Advance Directive? Yes No Yes  Yes Yes Yes  Type of Estate agent of [place];Living will  Healthcare Power of [place];Living will Healthcare Power of [employer] of [place];Living will Healthcare Power of [place];Living will  Does patient  want to make changes to medical advance directive? No - Patient declined  No - Patient declined No - Patient declined Yes (Inpatient - patient defers changing a medical advance directive at this time - Information given)  No - Patient declined  Copy of Healthcare Power of Attorney in Chart? Yes - validated most recent copy scanned in chart (See row information)  Yes - validated most recent copy scanned in chart (See row information) Yes - validated most recent copy scanned in chart (See row information)  Yes - validated most recent copy scanned in chart (See row information) Yes - validated most recent copy scanned in chart (See row information)    Current Medications (verified) Outpatient Encounter Medications as of 10/12/2023  Medication Sig   amoxicillin (AMOXIL) 500 MG capsule Take by mouth as directed. Take 4 caps by mouth 1 hour prior to dental procedures   Calcium Carbonate (CALCIUM 500 PO) Take 500 mg by mouth daily.   Cholecalciferol (VITAMIN D3) 25 MCG (1000 UT) CAPS Take 1,000 Units by mouth daily.   Cyanocobalamin  (B12 LIQUID HEALTH BOOSTER PO) Take 1,000 mcg by mouth every Friday.   dicyclomine  (BENTYL ) 10 MG capsule 1 tablet TID PRN   diltiazem  (CARDIZEM  CD) 120 MG 24 hr capsule TAKE ONE CAPSULE TWICE DAILY. MAY TAKE EXTRA CAPSULE DAILY FOR BREAKTHROUGH AFIB.   dofetilide  (TIKOSYN ) 500 MCG capsule TAKE ONE CAPSULE BY MOUTH TWICE DAILY   ELIQUIS  5 MG TABS tablet TAKE 1 TABLET TWICE DAILY   Estradiol 10 MCG TABS vaginal tablet Place 1 tablet vaginally 2 (two)  times a week.   gabapentin (NEURONTIN) 100 MG capsule Take 100 mg by mouth 3 (three) times daily as needed.   levothyroxine  (SYNTHROID ) 112 MCG tablet TAKE ONE TABLET BY MOUTH DAILY   Magnesium  250 MG TABS Take 250 mg by mouth every evening.    omeprazole  (PRILOSEC) 20 MG capsule TAKE ONE CAPSULE BY MOUTH DAILY   polyethylene glycol (MIRALAX  / GLYCOLAX ) 17 g packet Take 17 g by mouth 2 (two) times daily.   Potassium 99 MG TABS  Take 99 mg by mouth daily.    sodium chloride  (OCEAN) 0.65 % SOLN nasal spray Place 1 spray into both nostrils 2 (two) times daily.   traMADol  (ULTRAM ) 50 MG tablet Take 1 tablet (50 mg total) by mouth daily as needed for moderate pain (pain score 4-6).   vitamin C (ASCORBIC ACID) 500 MG tablet Take 500 mg by mouth daily.   No facility-administered encounter medications on file as of 10/12/2023.    Allergies (verified) Neomycin, Adhesive [tape], and Morphine    History: Past Medical History:  Diagnosis Date   Anemia    AR (allergic rhinitis)    Atrial fibrillation (HCC) 2018   Atrial flutter (HCC)    Clotting disorder (HCC)    pt denies   Diverticulosis    Esophageal ulcer    due to Motrin   GERD (gastroesophageal reflux disease)    Hiatal hernia    HLD (hyperlipidemia)    Hypothyroidism    IBS (irritable bowel syndrome)    Migraines    Mild sleep apnea    Osteoarthritis    Osteopenia after menopause 06/14/2017   DEXA 08/2016 [facility]: T = -1.10 radius, -1.0 femur; lumbar spine elevated due to DJD; recheck 2-3 years.   Pacemaker    CHB   Paroxysmal A-fib (HCC)    PUD (peptic ulcer disease)    Sleep apnea    Thyroid  nodule    Transient complete heart block 1996   Past Surgical History:  Procedure Laterality Date   ATRIAL FIBRILLATION ABLATION N/A 01/08/2019   Procedure: ATRIAL FIBRILLATION ABLATION;  Surgeon: Kelsie Agent, MD;  [facility]: [facility];  Service: Cardiovascular;  Laterality: N/A;   ATRIAL FIBRILLATION ABLATION N/A 04/02/2019   Procedure: ATRIAL FIBRILLATION ABLATION;  Surgeon: Kelsie Agent, MD;  Location: [facility];  Service: Cardiovascular;  Laterality: N/A;   CARDIOVERSION N/A 01/28/2019   Procedure: CARDIOVERSION;  Surgeon: Kate Lonni CROME, MD;  [facility];  Service: Endoscopy;  Laterality: N/A;   CARDIOVERSION N/A 02/27/2019   Procedure: CARDIOVERSION;  Surgeon: Kate Lonni CROME, MD;  Location: [facility] ENDOSCOPY;   Service: Endoscopy;  Laterality: N/A;   COLONOSCOPY  2017   [place]   HIP ARTHROPLASTY Right 2008   PACEMAKER GENERATOR CHANGE  03/01/2007   performed by Dr Marcine at [facility] ([facility])   PACEMAKER INSERTION  09/08/1994   performed in [facility] for transient complete heart block   PPM GENERATOR CHANGEOUT N/A 06/04/2020   Procedure: PPM GENERATOR CHANGEOUT;  Surgeon: Kelsie Agent, MD;  [facility]: [facility];  Service: Cardiovascular;  Laterality: N/A;   THYROIDECTOMY, PARTIAL  1986   BENIGN NODULES   TOTAL HIP ARTHROPLASTY Left 02/13/2018   Procedure: TOTAL HIP ARTHROPLASTY ANTERIOR APPROACH;  Surgeon: Ernie Cough, MD;  Location: [facility] ORS;  Service: Orthopedics;  Laterality: Left;  70 mins   TOTAL HIP REVISION Right 02/10/2022   Procedure: TOTAL HIP REVISION;  Surgeon: Ernie Cough, MD;  Location: [facility] ORS;  Service: Orthopedics;  Laterality: Right;   TUBAL LIGATION  1987   Family History  Problem Relation Age of Onset   Rheum arthritis Mother    Peptic Ulcer Mother    COPD Brother    Drug abuse Brother    Diabetes Mellitus II Maternal Aunt    Cancer Maternal Grandmother    Uterine cancer Paternal Grandmother    Breast cancer Neg Hx    Colon cancer Neg Hx    Colon polyps Neg Hx    Esophageal cancer Neg Hx    Rectal cancer Neg Hx    Stomach cancer Neg Hx    Social History   Socioeconomic History   Marital status: Married    Spouse name: Not on file   Number of children: 0   Years of education: Not on file   Highest education level: Not on file  Occupational History   Occupation: retired  Tobacco Use   Smoking status: Never    Passive exposure: Past   Smokeless tobacco: Never  Vaping Use   Vaping status: Never Used  Substance and Sexual Activity   Alcohol use: Not Currently    Alcohol/week: 1.0 standard drink of alcohol    Types: 1 Glasses of wine per week    Comment: not after A fib dx   Drug use: Never   Sexual activity: Not Currently     Birth control/protection: Post-menopausal, Surgical    Comment: Tubal ligation  Other Topics Concern   Not on file  Social History Narrative   [employer]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   Married, no children   Social Drivers of Corporate investment banker Strain: Low Risk  (10/12/2023)   Overall Financial Resource Strain (CARDIA)    Difficulty of Paying Living Expenses: Not hard at all  Food Insecurity: No Food Insecurity (10/12/2023)   Hunger Vital Sign    Worried About Running Out of Food in the Last Year: Never true    Ran Out of Food in the Last Year: Never true  Transportation Needs: No Transportation Needs (10/12/2023)   PRAPARE - Administrator, Civil Service (Medical): No    Lack of Transportation (Non-Medical): No  Physical Activity: Sufficiently Active (10/12/2023)   Exercise Vital Sign    Days of Exercise per Week: 5 days    Minutes of Exercise per Session: 90 min  Stress: No Stress Concern Present (10/12/2023)   [employer] of Occupational Health - Occupational Stress Questionnaire    Feeling of Stress: Not at all  Social Connections: Moderately Integrated (10/12/2023)   Social Connection and Isolation Panel    Frequency of Communication with Friends and Family: More than three times a week    Frequency of Social Gatherings with Friends and Family: More than three times a week    Attends Religious Services: Never    Database administrator or Organizations: Yes    Attends Engineer, structural: 1 to 4 times per year    Marital Status: Married    Tobacco Counseling Counseling given: Not Answered    Clinical Intake:  Pre-visit preparation completed: Yes  Pain : No/denies pain     BMI - recorded: 29.44 Diabetes: No  No results found for: HGBA1C   How often do you need to have someone help you when you read instructions, pamphlets, or other written materials from your doctor or pharmacy?: 1 - Never  Interpreter Needed?:  No  Information entered by :: Kristin Haws, LPN  Activities of Daily Living     10/12/2023    9:25 AM  In your present state of health, do you have any difficulty performing the following activities:  Hearing? 0  Vision? 0  Difficulty concentrating or making decisions? 0  Walking or climbing stairs? 0  Dressing or bathing? 0  Doing errands, shopping? 0  Preparing Food and eating ? N  Using the Toilet? N  In the past six months, have you accidently leaked urine? N  Do you have problems with loss of bowel control? N  Managing your Medications? N  Managing your Finances? N  Housekeeping or managing your Housekeeping? N    Patient Care Team: Jodie Lavern CROME, MD as PCP - General (Family Medicine) Ines Onetha NOVAK, MD as Consulting Physician (Neurology) Ernie Cough, MD as Consulting Physician (Orthopedic Surgery) Aundria Ladell POUR, MD as Consulting Physician (Gastroenterology) Waddell Danelle ORN, MD as Consulting Physician (Cardiology)  I have updated your Care Teams any recent Medical Services you may have received from other providers in the past year.     Assessment:   This is a routine wellness examination for Kristin [name].  Hearing/Vision screen Vision Screening - Comments:: Wears rx glasses - up to date with routine eye exams with Dr milissa in [facility]    Goals Addressed             This Visit's Progress    Patient Stated       Weight loss        Depression Screen     10/12/2023    9:26 AM 11/21/2022    9:19 AM 11/21/2022    9:13 AM 10/04/2022   11:11 AM 01/03/2022    2:02 PM 11/17/2021    1:05 PM 09/28/2021   11:46 AM  PHQ 2/9 Scores  PHQ - 2 Score 0 0 0 0 0 0 0    Fall Risk     10/12/2023    9:27 AM 11/21/2022    9:19 AM 11/21/2022    9:13 AM 10/04/2022   11:13 AM 01/03/2022    2:02 PM  Fall Risk   Falls in the past year? 1 0 0 0 0  Number falls in past yr: 1 0 0 0 0  Injury with Fall? 0 0 0 0 0  Risk for fall due to : History of fall(s)  No Fall Risks No Fall Risks Impaired vision No Fall Risks  Follow up Falls prevention discussed Falls evaluation completed Falls evaluation completed Falls prevention discussed Falls evaluation completed      Data saved with a previous flowsheet row definition    MEDICARE RISK AT HOME:  Medicare Risk at Home Any stairs in or around the home?: Yes If so, are there any without handrails?: No Home free of loose throw rugs in walkways, pet beds, electrical cords, etc?: Yes Adequate lighting in your home to reduce risk of falls?: Yes Life alert?: No Use of a cane, walker or w/c?: No Grab bars in the bathroom?: Yes Shower chair or bench in shower?: Yes Elevated toilet seat or a handicapped toilet?: No  TIMED UP AND GO:  Was the test performed?  No  Cognitive Function: 6CIT completed        10/12/2023    9:28 AM 10/04/2022   11:13 AM 09/28/2021   11:47 AM 09/10/2020   11:55 AM  6CIT Screen  What Year? 0 points 0 points 0 points 0 points  What month? 0 points 0 points  0 points 0 points  What time? 0 points 0 points 0 points 0 points  Count back from 20 0 points 0 points 0 points 0 points  Months in reverse 0 points 0 points 0 points 0 points  Repeat phrase 0 points 0 points 0 points 0 points  Total Score 0 points 0 points 0 points 0 points    Immunizations Immunization History  Administered Date(s) Administered   Fluad Quad(high Dose 65+) 11/01/2016, 10/08/2018, 11/14/2019, 11/16/2020, 11/17/2021   Fluad Trivalent(High Dose 65+) 01/08/2013, 11/21/2022   Hepatitis B, ADULT 11/24/2009   Hepatitis B, PED/ADOLESCENT 11/24/2009   INFLUENZA, HIGH DOSE SEASONAL PF 11/01/2016, 10/20/2017   Influenza, Quadrivalent, Recombinant, Inj, Pf 01/08/2013   Influenza, Seasonal, Injecte, Preservative Fre 11/11/2014   Influenza,inj,Quad PF,6+ Mos 12/24/2015   Influenza-Unspecified 11/01/2016   Moderna Sars-Covid-2 Vaccination 02/18/2019, 03/18/2019, 12/30/2019   Pneumococcal Conjugate-13  08/17/2016   Pneumococcal Polysaccharide-23 10/20/2017   Tdap 09/07/2010, 09/07/2010, 07/01/2013   Zoster Recombinant(Shingrix) 07/26/2017, 11/20/2017   Zoster, Live 01/08/2013    Screening Tests Health Maintenance  Topic Date Due   DTaP/Tdap/Td (4 - Td or Tdap) 07/02/2023   Influenza Vaccine  08/25/2023   COVID-19 Vaccine (4 - 2025-26 season) 09/25/2023   Mammogram  12/06/2023   Medicare Annual Wellness (AWV)  10/11/2024   DEXA SCAN  05/24/2025   Colonoscopy  05/04/2026   Pneumococcal Vaccine: 50+ Years  Completed   Hepatitis C Screening  Completed   Zoster Vaccines- Shingrix  Completed   HPV VACCINES  Aged Out   Meningococcal B Vaccine  Aged Out   Hepatitis B Vaccines 19-59 Average Risk  Discontinued    Health Maintenance Items Addressed: Mammogram ordered, See Nurse Notes at the end of this note  Additional Screening:  Vision Screening: Recommended annual ophthalmology exams for early detection of glaucoma and other disorders of the eye. Is the patient up to date with their annual eye exam?  Yes  Who is the provider or what is the name of the office in which the patient attends annual eye exams? Dr Milissa   Dental Screening: Recommended annual dental exams for proper oral hygiene  Community Resource Referral / Chronic Care Management: CRR required this visit?  No   CCM required this visit?  No   Plan:    I have personally reviewed and noted the following in the patient's chart:   Medical and social history Use of alcohol, tobacco or illicit drugs  Current medications and supplements including opioid prescriptions. Patient is currently taking opioid prescriptions. Information provided to patient regarding non-opioid alternatives. Patient advised to discuss non-opioid treatment plan with their provider. Functional ability and status Nutritional status Physical activity Advanced directives List of other physicians Hospitalizations, surgeries, and ER visits in  previous 12 months Vitals Screenings to include cognitive, depression, and falls Referrals and appointments  In addition, I have reviewed and discussed with patient certain preventive protocols, quality metrics, and best practice recommendations. A written personalized care plan for preventive services as well as general preventive health recommendations were provided to patient.   Kristin VEAR Haws, LPN   [id number]   After Visit Summary: (MyChart) Due to this being a telephonic visit, the after visit summary with patients personalized plan was offered to patient via MyChart   Notes: Nothing significant to report at this time.

## 2023-10-12 NOTE — Patient Instructions (Signed)
 Ms. Krogh,  Thank you for taking the time for your Medicare Wellness Visit. I appreciate your continued commitment to your health goals. Please review the care plan we discussed, and feel free to reach out if I can assist you further.  Medicare recommends these wellness visits once per year to help you and your care team stay ahead of potential health issues. These visits are designed to focus on prevention, allowing your provider to concentrate on managing your acute and chronic conditions during your regular appointments.  Please note that Annual Wellness Visits do not include a physical exam. Some assessments may be limited, especially if the visit was conducted virtually. If needed, we may recommend a separate in-person follow-up with your provider.  Ongoing Care Seeing your primary care provider every 3 to 6 months helps us  monitor your health and provide consistent, personalized care.   Referrals If a referral was made during today's visit and you haven't received any updates within two weeks, please contact the referred provider directly to check on the status.  Recommended Screenings:  Health Maintenance  Topic Date Due   DTaP/Tdap/Td vaccine (4 - Td or Tdap) 07/02/2023   Flu Shot  08/25/2023   COVID-19 Vaccine (4 - 2025-26 season) 09/25/2023   Medicare Annual Wellness Visit  10/04/2023   Breast Cancer Screening  12/06/2023   DEXA scan (bone density measurement)  05/24/2025   Colon Cancer Screening  05/04/2026   Pneumococcal Vaccine for age over 28  Completed   Hepatitis C Screening  Completed   Zoster (Shingles) Vaccine  Completed   HPV Vaccine  Aged Out   Meningitis B Vaccine  Aged Out   Hepatitis B Vaccine  Discontinued       10/12/2023    9:28 AM  Advanced Directives  Does Patient Have a Medical Advance Directive? Yes  Type of Estate agent of [place];Living will  Does patient want to make changes to medical advance directive? No - Patient  declined  Copy of Healthcare Power of Attorney in Chart? Yes - validated most recent copy scanned in chart (See row information)   Advance Care Planning is important because it: Ensures you receive medical care that aligns with your values, goals, and preferences. Provides guidance to your family and loved ones, reducing the emotional burden of decision-making during critical moments.  Vision: Annual vision screenings are recommended for early detection of glaucoma, cataracts, and diabetic retinopathy. These exams can also reveal signs of chronic conditions such as diabetes and high blood pressure.  Dental: Annual dental screenings help detect early signs of oral cancer, gum disease, and other conditions linked to overall health, including heart disease and diabetes.  Please see the attached documents for additional preventive care recommendations.

## 2023-10-18 NOTE — Progress Notes (Signed)
 Remote PPM Transmission

## 2023-10-26 ENCOUNTER — Encounter: Admitting: Internal Medicine

## 2023-11-09 ENCOUNTER — Ambulatory Visit: Attending: Internal Medicine | Admitting: Internal Medicine

## 2023-11-09 ENCOUNTER — Encounter: Payer: Self-pay | Admitting: Internal Medicine

## 2023-11-09 VITALS — BP 110/70 | HR 75 | Ht 67.0 in | Wt 192.0 lb

## 2023-11-09 DIAGNOSIS — I442 Atrioventricular block, complete: Secondary | ICD-10-CM | POA: Diagnosis not present

## 2023-11-09 DIAGNOSIS — I480 Paroxysmal atrial fibrillation: Secondary | ICD-10-CM | POA: Diagnosis not present

## 2023-11-09 LAB — CUP PACEART INCLINIC DEVICE CHECK
Date Time Interrogation Session: 20251016153910
Implantable Lead Connection Status: 753985
Implantable Lead Connection Status: 753985
Implantable Lead Implant Date: 19960815
Implantable Lead Implant Date: 19960815
Implantable Lead Location: 753859
Implantable Lead Location: 753860
Implantable Lead Model: 5024
Implantable Pulse Generator Implant Date: 20220512
Lead Channel Impedance Value: 437 Ohm
Lead Channel Impedance Value: 627 Ohm
Lead Channel Pacing Threshold Amplitude: 1.75 V
Lead Channel Pacing Threshold Amplitude: 5.6 V
Lead Channel Pacing Threshold Pulse Width: 0.4 ms
Lead Channel Sensing Intrinsic Amplitude: 5.6 mV

## 2023-11-09 NOTE — Patient Instructions (Signed)

## 2023-11-09 NOTE — Progress Notes (Signed)
 HPI [name] returns today for EP followup. She is a 73 yo woman with a h/o remote heart block s/p PPM insertion almost 30 years ago. She has had persistent atrial fib, who has undergone ablation and dofetilide . She has mostly maintained NSR. There was a question of phrenic nerve stimulation limiting some of the RF energy delivery. She denies syncope or chest pain. No edema. She has had some trouble with weight gain though she lost almost 4 lbs since her last visit. She has occaisional palpitations. Her PM was reprogrammed VVI 35.  Allergies  Allergen Reactions   Neomycin Itching   Adhesive [Tape] Rash and Other (See Comments)    Blistering rash   Morphine  Hives and Itching     Current Outpatient Medications  Medication Sig Dispense Refill   amoxicillin (AMOXIL) 500 MG capsule Take by mouth as directed. Take 4 caps by mouth 1 hour prior to dental procedures     Calcium Carbonate (CALCIUM 500 PO) Take 500 mg by mouth daily.     Cholecalciferol (VITAMIN D3) 25 MCG (1000 UT) CAPS Take 1,000 Units by mouth daily.     Cyanocobalamin  (B12 LIQUID HEALTH BOOSTER PO) Take 1,000 mcg by mouth every Friday.     diltiazem  (CARDIZEM  CD) 120 MG 24 hr capsule TAKE ONE CAPSULE TWICE DAILY. MAY TAKE EXTRA CAPSULE DAILY FOR BREAKTHROUGH AFIB. 225 capsule 3   dofetilide  (TIKOSYN ) 500 MCG capsule TAKE ONE CAPSULE BY MOUTH TWICE DAILY 180 capsule 3   ELIQUIS  5 MG TABS tablet TAKE 1 TABLET TWICE DAILY 180 tablet 3   Estradiol 10 MCG TABS vaginal tablet Place 1 tablet vaginally 2 (two) times a week.     gabapentin (NEURONTIN) 100 MG capsule Take 100 mg by mouth 3 (three) times daily as needed.     levothyroxine  (SYNTHROID ) 112 MCG tablet TAKE ONE TABLET BY MOUTH DAILY 90 tablet 3   Magnesium  250 MG TABS Take 250 mg by mouth every evening.      omeprazole  (PRILOSEC) 20 MG capsule TAKE ONE CAPSULE BY MOUTH DAILY 90 capsule 3   polyethylene glycol (MIRALAX  / GLYCOLAX ) 17 g packet Take 17 g by mouth 2  (two) times daily. 14 each 0   Potassium 99 MG TABS Take 99 mg by mouth daily.      sodium chloride  (OCEAN) 0.65 % SOLN nasal spray Place 1 spray into both nostrils 2 (two) times daily.     traMADol  (ULTRAM ) 50 MG tablet Take 1 tablet (50 mg total) by mouth daily as needed for moderate pain (pain score 4-6). 30 tablet 1   vitamin C (ASCORBIC ACID) 500 MG tablet Take 500 mg by mouth daily.     dicyclomine  (BENTYL ) 10 MG capsule 1 tablet TID PRN (Patient not taking: Reported on 11/09/2023) 30 capsule 3   No current facility-administered medications for this visit.     Past Medical History:  Diagnosis Date   Anemia    AR (allergic rhinitis)    Atrial fibrillation (HCC) 2018   Atrial flutter (HCC)    Clotting disorder    pt denies   Diverticulosis    Esophageal ulcer    due to Motrin   GERD (gastroesophageal reflux disease)    Hiatal hernia    HLD (hyperlipidemia)    Hypothyroidism    IBS (irritable bowel syndrome)    Migraines    Mild sleep apnea    Osteoarthritis    Osteopenia after menopause 06/14/2017   DEXA 08/2016  [facility]: T = -1.10 radius, -1.0 femur; lumbar spine elevated due to DJD; recheck 2-3 years.   Pacemaker    CHB   Paroxysmal A-fib (HCC)    PUD (peptic ulcer disease)    Sleep apnea    Thyroid  nodule    Transient complete heart block 1996    ROS:   All systems reviewed and negative except as noted in the HPI.   Past Surgical History:  Procedure Laterality Date   ATRIAL FIBRILLATION ABLATION N/A 01/08/2019   Procedure: ATRIAL FIBRILLATION ABLATION;  Surgeon: Kelsie Agent, MD;  Location: [facility];  Service: Cardiovascular;  Laterality: N/A;   ATRIAL FIBRILLATION ABLATION N/A 04/02/2019   Procedure: ATRIAL FIBRILLATION ABLATION;  Surgeon: Kelsie Agent, MD;  Location: [facility];  Service: Cardiovascular;  Laterality: N/A;   CARDIOVERSION N/A 01/28/2019   Procedure: CARDIOVERSION;  Surgeon: Kate Lonni CROME, MD;  Location: [facility]  ENDOSCOPY;  Service: Endoscopy;  Laterality: N/A;   CARDIOVERSION N/A 02/27/2019   Procedure: CARDIOVERSION;  Surgeon: Kate Lonni CROME, MD;  Location: [facility] ENDOSCOPY;  Service: Endoscopy;  Laterality: N/A;   COLONOSCOPY  2017   [place]   HIP ARTHROPLASTY Right 2008   PACEMAKER GENERATOR CHANGE  03/01/2007   performed by Dr Marcine at [facility] ([employer])   PACEMAKER INSERTION  09/08/1994   performed in [place] for transient complete heart block   PPM GENERATOR CHANGEOUT N/A 06/04/2020   Procedure: PPM GENERATOR CHANGEOUT;  Surgeon: Kelsie Agent, MD;  Location: [facility];  Service: Cardiovascular;  Laterality: N/A;   THYROIDECTOMY, PARTIAL  1986   BENIGN NODULES   TOTAL HIP ARTHROPLASTY Left 02/13/2018   Procedure: TOTAL HIP ARTHROPLASTY ANTERIOR APPROACH;  Surgeon: Ernie Cough, MD;  Location: [facility];  Service: Orthopedics;  Laterality: Left;  70 mins   TOTAL HIP REVISION Right 02/10/2022   Procedure: TOTAL HIP REVISION;  Surgeon: Ernie Cough, MD;  Location: [facility] OR[facility];  Service: Orthopedics;  Laterality: Right;   TUBAL LIGATION  1987     Family History  Problem Relation Age of Onset   Rheum arthritis Mother    Peptic Ulcer Mother    COPD Brother    Drug abuse Brother    Diabetes Mellitus II Maternal Aunt    Cancer Maternal Grandmother    Uterine cancer Paternal Grandmother    Breast cancer Neg Hx    Colon cancer Neg Hx    Colon polyps Neg Hx    Esophageal cancer Neg Hx    Rectal cancer Neg Hx    Stomach cancer Neg Hx      Social History   Socioeconomic History   Marital status: Married    Spouse name: Not on file   Number of children: 0   Years of education: Not on file   Highest education level: Not on file  Occupational History   Occupation: retired  Tobacco Use   Smoking status: Never    Passive exposure: Past   Smokeless tobacco: Never  Vaping Use   Vaping status: Never Used  Substance and Sexual Activity   Alcohol use: Not  Currently    Alcohol/week: 1.0 standard drink of alcohol    Types: 1 Glasses of wine per week    Comment: not after A fib dx   Drug use: Never   Sexual activity: Not Currently    Birth control/protection: Post-menopausal, Surgical    Comment: Tubal ligation  Other Topics Concern   Not on file  Social History Narrative  [employer]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   Married, no children   Social Drivers of Corporate investment banker Strain: Low Risk  (10/12/2023)   Overall Financial Resource Strain (CARDIA)    Difficulty of Paying Living Expenses: Not hard at all  Food Insecurity: No Food Insecurity (10/12/2023)   Hunger Vital Sign    Worried About Running Out of Food in the Last Year: Never true    Ran Out of Food in the Last Year: Never true  Transportation Needs: No Transportation Needs (10/12/2023)   PRAPARE - Administrator, Civil Service (Medical): No    Lack of Transportation (Non-Medical): No  Physical Activity: Sufficiently Active (10/12/2023)   Exercise Vital Sign    Days of Exercise per Week: 5 days    Minutes of Exercise per Session: 90 min  Stress: No Stress Concern Present (10/12/2023)   [employer] of Occupational Health - Occupational Stress Questionnaire    Feeling of Stress: Not at all  Social Connections: Moderately Integrated (10/12/2023)   Social Connection and Isolation Panel    Frequency of Communication with Friends and Family: More than three times a week    Frequency of Social Gatherings with Friends and Family: More than three times a week    Attends Religious Services: Never    Database administrator or Organizations: Yes    Attends Banker Meetings: 1 to 4 times per year    Marital Status: Married  Catering manager Violence: Not At Risk (10/12/2023)   Humiliation, Afraid, Rape, and Kick questionnaire    Fear of Current or Ex-Partner: No    Emotionally Abused: No    Physically Abused: No    Sexually Abused: No      BP 110/70 (BP Location: Left Arm, Patient Position: Sitting, Cuff Size: Large)   Pulse 75   Ht 5' 7 (1.702 m)   Wt 192 lb (87.1 kg)   SpO2 97%   BMI 30.07 kg/m   Physical Exam:  Well appearing NAD HEENT: Unremarkable Neck:  No JVD, no thyromegally Lymphatics:  No adenopathy Back:  No CVA tenderness Lungs:  Clear with  HEART:  Regular rate rhythm, no murmurs, no rubs, no clicks Abd:  soft, positive bowel sounds, no organomegally, no rebound, no guarding Ext:  2 plus pulses, no edema, no cyanosis, no clubbing Skin:  No rashes no nodules Neuro:  CN II through XII intact, motor grossly intact  EKG - nsr with IRBBB  DEVICE  Normal device function.  See PaceArt for details.   Assess/Plan:  Remote CHB - she is conducting with no exceptions. We will follow. Persistent atrial fib - she is maintaining NSR very nicely on dofetilide . Continue. Obesity - she is encouraged to lose weight.  Danelle Quinnlan Abruzzo,MD

## 2023-11-16 ENCOUNTER — Other Ambulatory Visit: Payer: Self-pay | Admitting: Family Medicine

## 2023-11-16 ENCOUNTER — Other Ambulatory Visit: Payer: Self-pay | Admitting: Internal Medicine

## 2023-11-22 ENCOUNTER — Ambulatory Visit: Payer: Medicare PPO | Admitting: Family Medicine

## 2023-11-22 ENCOUNTER — Encounter: Payer: Self-pay | Admitting: Family Medicine

## 2023-11-22 VITALS — BP 130/74 | HR 60 | Temp 97.7°F | Ht 67.0 in | Wt 196.4 lb

## 2023-11-22 DIAGNOSIS — Z23 Encounter for immunization: Secondary | ICD-10-CM | POA: Diagnosis not present

## 2023-11-22 DIAGNOSIS — I4819 Other persistent atrial fibrillation: Secondary | ICD-10-CM

## 2023-11-22 DIAGNOSIS — Z7901 Long term (current) use of anticoagulants: Secondary | ICD-10-CM

## 2023-11-22 DIAGNOSIS — I4892 Unspecified atrial flutter: Secondary | ICD-10-CM

## 2023-11-22 DIAGNOSIS — M8580 Other specified disorders of bone density and structure, unspecified site: Secondary | ICD-10-CM

## 2023-11-22 DIAGNOSIS — Z95811 Presence of heart assist device: Secondary | ICD-10-CM | POA: Insufficient documentation

## 2023-11-22 DIAGNOSIS — Z950 Presence of cardiac pacemaker: Secondary | ICD-10-CM

## 2023-11-22 DIAGNOSIS — E890 Postprocedural hypothyroidism: Secondary | ICD-10-CM

## 2023-11-22 DIAGNOSIS — Z0001 Encounter for general adult medical examination with abnormal findings: Secondary | ICD-10-CM

## 2023-11-22 DIAGNOSIS — K219 Gastro-esophageal reflux disease without esophagitis: Secondary | ICD-10-CM

## 2023-11-22 DIAGNOSIS — E538 Deficiency of other specified B group vitamins: Secondary | ICD-10-CM | POA: Diagnosis not present

## 2023-11-22 DIAGNOSIS — E559 Vitamin D deficiency, unspecified: Secondary | ICD-10-CM | POA: Diagnosis not present

## 2023-11-22 DIAGNOSIS — R269 Unspecified abnormalities of gait and mobility: Secondary | ICD-10-CM

## 2023-11-22 DIAGNOSIS — G4733 Obstructive sleep apnea (adult) (pediatric): Secondary | ICD-10-CM

## 2023-11-22 DIAGNOSIS — M160 Bilateral primary osteoarthritis of hip: Secondary | ICD-10-CM

## 2023-11-22 DIAGNOSIS — Z8679 Personal history of other diseases of the circulatory system: Secondary | ICD-10-CM | POA: Diagnosis not present

## 2023-11-22 LAB — LIPID PANEL
Cholesterol: 214 mg/dL — ABNORMAL HIGH (ref 0–200)
HDL: 82.6 mg/dL (ref >39.00)
LDL Cholesterol: 106 mg/dL — ABNORMAL HIGH (ref 0–99)
NonHDL: 131.18
Total CHOL/HDL Ratio: 3
Triglycerides: 124 mg/dL (ref 0.0–149.0)
VLDL: 24.8 mg/dL (ref 0.0–40.0)

## 2023-11-22 LAB — VITAMIN B12: Vitamin B-12: 636 pg/mL (ref 211–911)

## 2023-11-22 LAB — COMPREHENSIVE METABOLIC PANEL WITH GFR
ALT: 15 U/L (ref 0–35)
AST: 20 U/L (ref 0–37)
Albumin: 4.7 g/dL (ref 3.5–5.2)
Alkaline Phosphatase: 95 U/L (ref 39–117)
BUN: 15 mg/dL (ref 6–23)
CO2: 28 meq/L (ref 19–32)
Calcium: 9.6 mg/dL (ref 8.4–10.5)
Chloride: 99 meq/L (ref 96–112)
Creatinine, Ser: 0.78 mg/dL (ref 0.40–1.20)
GFR: 75.9 mL/min (ref >60.00)
Glucose, Bld: 99 mg/dL (ref 70–99)
Potassium: 4.8 meq/L (ref 3.5–5.1)
Sodium: 136 meq/L (ref 135–145)
Total Bilirubin: 0.8 mg/dL (ref 0.2–1.2)
Total Protein: 7 g/dL (ref 6.0–8.3)

## 2023-11-22 LAB — CBC WITH DIFFERENTIAL/PLATELET
Basophils Absolute: 0.1 K/uL (ref 0.0–0.1)
Basophils Relative: 1.1 % (ref 0.0–3.0)
Eosinophils Absolute: 0 K/uL (ref 0.0–0.7)
Eosinophils Relative: 0.9 % (ref 0.0–5.0)
HCT: 38 % (ref 36.0–46.0)
Hemoglobin: 12.5 g/dL (ref 12.0–15.0)
Lymphocytes Relative: 17 % (ref 12.0–46.0)
Lymphs Abs: 0.8 K/uL (ref 0.7–4.0)
MCHC: 32.9 g/dL (ref 30.0–36.0)
MCV: 96.1 fl (ref 78.0–100.0)
Monocytes Absolute: 0.5 K/uL (ref 0.1–1.0)
Monocytes Relative: 9.5 % (ref 3.0–12.0)
Neutro Abs: 3.4 K/uL (ref 1.4–7.7)
Neutrophils Relative %: 71.5 % (ref 43.0–77.0)
Platelets: 245 K/uL (ref 150.0–400.0)
RBC: 3.96 Mil/uL (ref 3.87–5.11)
RDW: 13.7 % (ref 11.5–15.5)
WBC: 4.8 K/uL (ref 4.0–10.5)

## 2023-11-22 LAB — TSH: TSH: 1.53 u[IU]/mL (ref 0.35–5.50)

## 2023-11-22 LAB — VITAMIN D 25 HYDROXY (VIT D DEFICIENCY, FRACTURES): VITD: 35.89 ng/mL (ref 30.00–100.00)

## 2023-11-22 MED ORDER — TRAMADOL HCL 50 MG PO TABS: 50.0000 mg | ORAL_TABLET | Freq: Every day | ORAL | 1 refills | Status: AC | PRN

## 2023-11-23 NOTE — Progress Notes (Signed)
 Subjective  Chief Complaint  Patient presents with   Annual Exam    Pt here for Annual Exam and is currently fasting    Hypothyroidism    HPI: Kristin [name] is a 73 y.o. female who presents to [facility] [place] today for a Female Wellness Visit. She also has the concerns and/or needs as listed above in the chief complaint. These will be addressed in addition to the Health Maintenance Visit.   Wellness Visit: annual visit with health maintenance review and exam  HM: screens are up to date and nl. Colonoscopy due 2028. Mammo scheduled for December. Doing well overall. Active on the farm.   Chronic disease f/u and/or acute problem visit: (deemed necessary to be done in addition to the wellness visit): Discussed the use of AI scribe software for clinical note transcription with the patient, who gave verbal consent to proceed.  History of Present Illness Kristin [name] is a 72 year old female with atrial fibrillation and hip replacements who presents for follow-up of abdominal pain and gait issues.  Abdominal and pelvic pain, chronic: ? Neuropathic since improved significantly on gabapentin. Reviewed GI and GYN evals. No clear etiology identified.  - Ongoing abdominal pain localized to the groin area - Initially constant and severe enough to prevent sleep prior to treatment - Ultrasounds revealed fluid retention in the uterus - History of cervical conization; OBGYN indicated cervical stenosis may be related - Vaginal estrogen pills used twice weekly, resulting in decreased pain - Gabapentin significantly controls pain and improves sleep; current regimen reduced from three times daily to once nightly  Lower back pain - Chronic lower back pain attributed to arthritis - Pain does not radiate to the anterior abdomen  Gait disturbance and lower extremity pain - Persistent limp following bilateral hip replacements - Concern for possible leg length discrepancy or  residual effects from delayed first hip surgery and knee OA - No recent physical therapy; previously performed PT exercises - 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, but no structured exercise regimen - Knee pain, predominantly in the right knee, suspected to be secondary to altered gait mechanics  Atrial fibrillation and anticoagulation - History of atrial fibrillation managed with blood thinners and heart medications - Regular follow-up with AFib clinic to monitor for medication interactions, including with gabapentin  Blood pressure and lipid concerns - Home blood pressure readings consistently around 115/70 mmHg; higher readings in clinical settings - Borderline elevated cholesterol levels with high HDL (90) - No personal history of hypertension or diabetes - No family history of heart disease  Hypothyroidism clinically stable compliant with medication due for recheck today. Assessment  1. Encounter for well adult exam with abnormal findings   2. Need for influenza vaccination   3. Hypothyroidism, postsurgical   4. History of complete heart block   5. Pacemaker   6. Persistent atrial fibrillation (HCC)   7. Long term current use of anticoagulant   8. Gastroesophageal reflux disease, unspecified whether esophagitis present   9. OSA (obstructive sleep apnea)   10. Primary osteoarthritis of both hips   11. Osteopenia after menopause   12. Atrial flutter, unspecified type (HCC)   13. Presence of heart assist device (HCC)   14. Vitamin B12 deficiency   15. Vitamin D  deficiency   16. Abnormal gait      Plan  Female Wellness Visit: Age appropriate Health Maintenance and Prevention measures were discussed with patient. Included topics are cancer screening recommendations, ways to  keep healthy (see AVS) including dietary and exercise recommendations, regular eye and dental care, use of seat belts, and avoidance of moderate alcohol use and tobacco use.  BMI: discussed patient's BMI and  encouraged positive lifestyle modifications to help get to or maintain a target BMI. HM needs and immunizations were addressed and ordered. See below for orders. See HM and immunization section for updates. Routine labs and screening tests ordered including cmp, cbc and lipids where appropriate. Discussed recommendations regarding Vit D and calcium supplementation (see AVS)  Chronic disease management visit and/or acute problem visit: Assessment and Plan Assessment & Plan Adult Wellness Visit Routine wellness visit with slightly elevated blood pressure in office but normal at home. HDL is high and LDL is borderline high. No current indication for cholesterol medication due to lack of other cardiovascular risk factors. Discussed potential use of calcium score CT to assess cardiovascular risk further. - Perform blood work to assess cholesterol levels - Monitor blood pressure at home - Discuss results of blood work and potential need for calcium score CT  Atrial fibrillation and atrial flutter, status post pacemaker and heart assist device, on chronic anticoagulation Atrial fibrillation and flutter are well-managed with current pacemaker and anticoagulation therapy. - Borderline elevated LDL with high HDL: Will reassess cardiovascular risk with calcium score coronary CT.  Bilateral hip osteoarthritis, post-arthroplasty, with persistent gait abnormality Persistent gait abnormality post-arthroplasty, possibly due to pelvic tilt and compensatory mechanisms. No leg length discrepancy noted. Limited range of motion in the hip. Gait abnormality may contribute to knee pain. - Refer to physical therapy for gait evaluation and management  Knee osteoarthritis, right worse than left Right knee osteoarthritis with pain and functional limitations, possibly exacerbated by gait abnormalities. - Refer to physical therapy for gait evaluation and management  Chronic neuropathic pelvic/abdominal pain, improved  with gabapentin Chronic neuropathic pelvic/abdominal pain improved with gabapentin. Previously severe and constant, now managed with nighttime gabapentin and occasional daytime pain. Discussed potential for tapering gabapentin to assess pain control. Gabapentin does not interact with current medications. - Continue gabapentin at current dose - Consider tapering gabapentin to assess pain control  Postprocedural hypothyroidism Postprocedural hypothyroidism well-managed on current levothyroxine  therapy. No new symptoms and good energy levels.  Check TSH  Other specified disorders of bone density and structure Bone density stable with no new concerns.  General immunization Flu shot due and will be administered today. - Administer flu shot - Recheck vitamin D  and B12 deficiencies.  Adjust if needed.    Follow up: 12 months Orders Placed This Encounter  Procedures   CT CARDIAC SCORING (SELF PAY ONLY)   Flu vaccine HIGH DOSE PF(Fluzone Trivalent)   TSH   VITAMIN D  25 Hydroxy (Vit-D Deficiency, Fractures)   CBC with Differential/Platelet   Comprehensive metabolic panel with GFR   Lipid panel   Vitamin B12   Ambulatory referral to Physical Therapy   No orders of the defined types were placed in this encounter.     Body mass index is 30.76 kg/m. Wt Readings from Last 3 Encounters:  11/22/23 196 lb 6.4 oz (89.1 kg)  11/09/23 192 lb (87.1 kg)  10/12/23 188 lb (85.3 kg)     Patient Active Problem List   Diagnosis Date Noted   History of complete heart block 10/06/2015    Priority: High    Overview:  s/p pacemaker    Long term current use of anticoagulant 08/05/2015    Priority: High   Pacemaker 10/14/2014    Priority:  High   Paroxysmal atrial fibrillation (HCC) 10/14/2014    Priority: High    S/p ablation 12/2018, Dr. Kelsie Maintains sinus on tikosyn     Hypothyroidism, postsurgical 01/08/2013    Priority: High   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/10/2022    Priority:  Medium     Jan 2024, Dr. Ernie    Fibroid 01/11/2022    Priority: Medium     TVUS 12/2021 for postmenopausal bleeding.  Cervical stenosis as well Thin endometrium    OSA (obstructive sleep apnea) 10/20/2017    Priority: Medium     Class: Chronic    Mild; did not tolerate cpap    Osteopenia after menopause 06/14/2017    Priority: Medium     DEXA 04/2022 [facility]: T = - 2.1 at radius. Femurs excluded due to hardware. L1-L4 T - 0.3; osteopenia. Recheck 2-3 years.  DEXA 10/2018 [facility]: T = -1.4 lowest, stable osteopenia, recheck 2-3 years.  DEXA 08/2016 [facility]: T = -1.10 radius, -1.0 femur; lumbar spine elevated due to DJD; recheck 2-3 years.    Osteoarthritis of left hip 05/11/2017    Priority: Medium    GERD (gastroesophageal reflux disease) 01/08/2013    Priority: Medium     EGD 2021: normal. 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 Lennard    OA (osteoarthritis) 01/08/2013    Priority: Medium    History of peptic ulcer disease - nsaid 01/08/2013    Priority: Medium     Overview:  Due to Nsaids    Diverticulosis of large intestine without hemorrhage 01/04/2016    Priority: Low   Internal hemorrhoids without complication 01/04/2016    Priority: Low   AR (allergic rhinitis) 01/08/2013    Priority: Low   S/P left THA, AA 02/13/2018    Priority: 2.   Presence of heart assist device (HCC) 11/22/2023   Left atrial enlargement 11/16/2020   Atrial flutter [facility])    Health Maintenance  Topic Date Due   DTaP/Tdap/Td (4 - Td or Tdap) 07/02/2023   COVID-19 Vaccine (4 - 2025-26 season) 09/25/2023   Mammogram  12/06/2023   Medicare Annual Wellness (AWV)  10/11/2024   DEXA SCAN  05/24/2025   Colonoscopy  05/04/2026   Pneumococcal Vaccine: 50+ Years  Completed   Influenza Vaccine  Completed   Hepatitis C Screening  Completed   Zoster Vaccines- Shingrix  Completed   Meningococcal B Vaccine  Aged Out   Hepatitis B Vaccines 19-59 Average Risk  Discontinued    Immunization History  Administered Date(s) Administered   Fluad Quad(high Dose 65+) 11/01/2016, 10/08/2018, 11/14/2019, 11/16/2020, 11/17/2021   Fluad Trivalent(High Dose 65+) 01/08/2013, 11/21/2022   Hepatitis B, ADULT 11/24/2009   Hepatitis B, PED/ADOLESCENT 11/24/2009   INFLUENZA, HIGH DOSE SEASONAL PF 11/01/2016, 10/20/2017, 11/22/2023   Influenza, Quadrivalent, Recombinant, Inj, Pf 01/08/2013   Influenza, Seasonal, Injecte, Preservative Fre 11/11/2014   Influenza,inj,Quad PF,6+ Mos 12/24/2015   Influenza-Unspecified 11/01/2016   Moderna Sars-Covid-2 Vaccination 02/18/2019, 03/18/2019, 12/30/2019   Pneumococcal Conjugate-13 08/17/2016   Pneumococcal Polysaccharide-23 10/20/2017   Tdap 09/07/2010, 09/07/2010, 07/01/2013   Zoster Recombinant(Shingrix) 07/26/2017, 11/20/2017   Zoster, Live 01/08/2013   We updated and reviewed the patient's past history in detail and it is documented below. Allergies: Patient is allergic to neomycin, adhesive [tape], and morphine . Past Medical History Patient  has a past medical history of Anemia, AR (allergic rhinitis), Atrial fibrillation (HCC) (2018), Atrial flutter (HCC), Clotting disorder, Diverticulosis, Esophageal ulcer, GERD (gastroesophageal reflux disease), Hiatal hernia, HLD (hyperlipidemia),  Hypothyroidism, IBS (irritable bowel syndrome), Migraines, Mild sleep apnea, Osteoarthritis, Osteopenia after menopause (06/14/2017), Pacemaker, Paroxysmal A-fib (HCC), PUD (peptic ulcer disease), Sleep apnea, Thyroid  nodule, and Transient complete heart block (1996). Past Surgical History Patient  has a past surgical history that includes Thyroidectomy, partial (1986); Tubal ligation (1987); Pacemaker insertion (09/08/1994); Pacemaker generator change (03/01/2007); Hip Arthroplasty (Right, 2008); Total hip arthroplasty (Left, 02/13/2018); ATRIAL FIBRILLATION ABLATION (N/A, 01/08/2019); Cardioversion (N/A, 01/28/2019); Cardioversion (N/A, 02/27/2019);  ATRIAL FIBRILLATION ABLATION (N/A, 04/02/2019); PPM GENERATOR CHANGEOUT (N/A, 06/04/2020); Total hip revision (Right, 02/10/2022); and Colonoscopy (2017). Family History: Patient family history includes COPD in her brother; Cancer in her maternal grandmother; Diabetes Mellitus II in her maternal aunt; Drug abuse in her brother; Peptic Ulcer in her mother; Rheum arthritis in her mother; Uterine cancer in her paternal grandmother. Social History:  Patient  reports that she has never smoked. She has been exposed to tobacco smoke. She has never used smokeless tobacco. She reports that she does not currently use alcohol after a past usage of about 1.0 standard drink of alcohol per week. She reports that she does not use drugs.  Review of Systems: Constitutional: negative for fever or malaise Ophthalmic: negative for photophobia, double vision or loss of vision Cardiovascular: negative for chest pain, dyspnea on exertion, or new LE swelling Respiratory: negative for SOB or persistent cough Gastrointestinal: negative for abdominal pain, change in bowel habits or melena Genitourinary: negative for dysuria or gross hematuria, no abnormal uterine bleeding or disharge Musculoskeletal: negative for new gait disturbance or muscular weakness Integumentary: negative for new or persistent rashes, no breast lumps Neurological: negative for TIA or stroke symptoms Psychiatric: negative for SI or delusions Allergic/Immunologic: negative for hives  Patient Care Team    Relationship Specialty Notifications Start End  Jodie Lavern CROME, MD PCP - General Family Medicine  06/14/17   Ines Onetha NOVAK, MD Consulting Physician Neurology  06/14/17   Ernie Cough, MD Consulting Physician Orthopedic Surgery  06/14/17   Aundria Ladell POUR, MD Consulting Physician Gastroenterology  06/14/17   Waddell Danelle ORN, MD Consulting Physician Cardiology  11/21/22     Objective  Vitals: BP 130/74   Pulse 60   Temp 97.7 F (36.5 C)    Ht 5' 7 (1.702 m)   Wt 196 lb 6.4 oz (89.1 kg)   SpO2 97%   BMI 30.76 kg/m  General:  Well developed, well nourished, no acute distress  Psych:  Alert and orientedx3,normal mood and affect HEENT:  Normocephalic, atraumatic, non-icteric sclera,  supple neck without adenopathy, mass or thyromegaly Cardiovascular:  Normal S1, S2, RRR without gallop, rub or murmur Respiratory:  Good breath sounds bilaterally, CTAB with normal respiratory effort Gastrointestinal: normal bowel sounds, soft, non-tender, no noted masses. No HSM MSK: extremities without edema, joints without erythema or swelling, abnormal gait Neurologic:    Mental status is normal.  Gross motor and sensory exams are normal.  No tremor  Commons side effects, risks, benefits, and alternatives for medications and treatment plan prescribed today were discussed, and the patient expressed understanding of the given instructions. Patient is instructed to call or message via MyChart if he/she has any questions or concerns regarding our treatment plan. No barriers to understanding were identified. We discussed Red Flag symptoms and signs in detail. Patient expressed understanding regarding what to do in case of urgent or emergency type symptoms.  Medication list was reconciled, printed and provided to the patient in AVS. Patient instructions and summary information was reviewed with the  patient as documented in the AVS. This note was prepared with assistance of Dragon voice recognition software. Occasional wrong-word or sound-a-like substitutions may have occurred due to the inherent limitations of voice recognition software

## 2023-11-29 ENCOUNTER — Ambulatory Visit: Payer: Self-pay | Admitting: Family Medicine

## 2023-11-29 NOTE — Progress Notes (Signed)
 Labs reviewed.  The 10-year ASCVD risk score (Arnett DK, et al., 2019) is: 11.7%   Values used to calculate the score:     Age: 73 years     Clincally relevant sex: Female     Is Non-Hispanic African American: No     Diabetic: No     Tobacco smoker: No     Systolic Blood Pressure: 130 mmHg     Is BP treated: No     HDL Cholesterol: 82.6 mg/dL     Total Cholesterol: 214 mg/dL  Dear Ms. Kristin [name], Thank you for allowing me to care for you at your recent office visit.  I wanted to let you know that I have reviewed your lab test results and am happy to report that they are stable. Your cholesterol levels are stable but cardiovascular risk score remains elevated.  Lets see what the coronary calcium score is so we can decide if we need to add a statin.   All other lab results look good. Sincerely, Dr. Jodie

## 2023-12-04 ENCOUNTER — Ambulatory Visit (HOSPITAL_BASED_OUTPATIENT_CLINIC_OR_DEPARTMENT_OTHER)
Admission: RE | Admit: 2023-12-04 | Discharge: 2023-12-04 | Disposition: A | Discharge status: Home / Self Care | Payer: Self-pay | Source: Ambulatory Visit | Attending: Family Medicine | Admitting: Family Medicine

## 2023-12-04 ENCOUNTER — Ambulatory Visit: Admitting: Physical Therapy

## 2023-12-04 DIAGNOSIS — R2689 Other abnormalities of gait and mobility: Secondary | ICD-10-CM

## 2023-12-04 DIAGNOSIS — I4819 Other persistent atrial fibrillation: Secondary | ICD-10-CM | POA: Insufficient documentation

## 2023-12-04 DIAGNOSIS — M6281 Muscle weakness (generalized): Secondary | ICD-10-CM

## 2023-12-04 DIAGNOSIS — Z8679 Personal history of other diseases of the circulatory system: Secondary | ICD-10-CM | POA: Insufficient documentation

## 2023-12-04 NOTE — Therapy (Unsigned)
 OUTPATIENT PHYSICAL THERAPY LOWER EXTREMITY EVALUATION   Patient Name: Kristin [name] MRN: [id number] DOB:May 03, 1950, 72 y.o., female Today's Date: 12/04/2023  END OF SESSION:   Past Medical History:  Diagnosis Date   Anemia    AR (allergic rhinitis)    Atrial fibrillation (HCC) 2018   Atrial flutter (HCC)    Clotting disorder    pt denies   Diverticulosis    Esophageal ulcer    due to Motrin   GERD (gastroesophageal reflux disease)    Hiatal hernia    HLD (hyperlipidemia)    Hypothyroidism    IBS (irritable bowel syndrome)    Migraines    Mild sleep apnea    Osteoarthritis    Osteopenia after menopause 06/14/2017   DEXA 08/2016 [facility]: T = -1.10 radius, -1.0 femur; lumbar spine elevated due to DJD; recheck 2-3 years.   Pacemaker    CHB   Paroxysmal A-fib (HCC)    PUD (peptic ulcer disease)    Sleep apnea    Thyroid  nodule    Transient complete heart block 1996   Past Surgical History:  Procedure Laterality Date   ATRIAL FIBRILLATION ABLATION N/A 01/08/2019   Procedure: ATRIAL FIBRILLATION ABLATION;  Surgeon: Kelsie Agent, MD;  [facility]: [facility];  Service: Cardiovascular;  Laterality: N/A;   ATRIAL FIBRILLATION ABLATION N/A 04/02/2019   Procedure: ATRIAL FIBRILLATION ABLATION;  Surgeon: Kelsie Agent, MD;  Location: [facility];  Service: Cardiovascular;  Laterality: N/A;   CARDIOVERSION N/A 01/28/2019   Procedure: CARDIOVERSION;  Surgeon: Kate Lonni CROME, MD;  Location: [facility] ENDOSCOPY;  Service: Endoscopy;  Laterality: N/A;   CARDIOVERSION N/A 02/27/2019   Procedure: CARDIOVERSION;  Surgeon: Kate Lonni CROME, MD;  Location: [facility] ENDOSCOPY;  Service: Endoscopy;  Laterality: N/A;   COLONOSCOPY  2017   [place]   HIP ARTHROPLASTY Right 2008   PACEMAKER GENERATOR CHANGE  03/01/2007   performed by Dr Marcine at [facility] ([employer])   PACEMAKER INSERTION  09/08/1994   performed in [place] for transient complete heart block    PPM GENERATOR CHANGEOUT N/A 06/04/2020   Procedure: PPM GENERATOR CHANGEOUT;  Surgeon: Kelsie Agent, MD;  Location: [facility];  Service: Cardiovascular;  Laterality: N/A;   THYROIDECTOMY, PARTIAL  1986   BENIGN NODULES   TOTAL HIP ARTHROPLASTY Left 02/13/2018   Procedure: TOTAL HIP ARTHROPLASTY ANTERIOR APPROACH;  Surgeon: Ernie Cough, MD;  Location: [facility];  Service: Orthopedics;  Laterality: Left;  70 mins   TOTAL HIP REVISION Right 02/10/2022   Procedure: TOTAL HIP REVISION;  Surgeon: Ernie Cough, MD;  Location: [facility];  Service: Orthopedics;  Laterality: Right;   TUBAL LIGATION  1987   Patient Active Problem List   Diagnosis Date Noted   Presence of heart assist device (HCC) 11/22/2023   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/10/2022   Fibroid 01/11/2022   Left atrial enlargement 11/16/2020   Atrial flutter (HCC)    S/P left THA, AA 02/13/2018   OSA (obstructive sleep apnea) 10/20/2017    Class: Chronic   Osteopenia after menopause 06/14/2017   Osteoarthritis of left hip 05/11/2017   Diverticulosis of large intestine without hemorrhage 01/04/2016   Internal hemorrhoids without complication 01/04/2016   History of complete heart block 10/06/2015   Long term current use of anticoagulant 08/05/2015   Pacemaker 10/14/2014   Paroxysmal atrial fibrillation (HCC) 10/14/2014   AR (allergic rhinitis) 01/08/2013   GERD (gastroesophageal reflux disease) 01/08/2013   Hypothyroidism, postsurgical 01/08/2013   OA (  osteoarthritis) 01/08/2013   History of peptic ulcer disease - nsaid 01/08/2013      PCP: Lavern Heck  REFERRING PROVIDER: Lavern Heck   REFERRING DIAG: Gait   THERAPY DIAG:  No diagnosis found.  Rationale for Evaluation and Treatment: Rehabilitation  ONSET DATE:    SUBJECTIVE:   SUBJECTIVE STATEMENT: 15 yr ago, hip replacement R side. Was limping prior to surgery . Limp has not gone away .  L hip also done, and R re-done- due to faulty prostheis-  recall. 02/10/2022.  Both knees are now hurting R>L.  Feels knees are weaker, difficulty going up stairs, R gives out- needs railing.  Did not have actual in person PT following last L and R hips. Did have home health.  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    PERTINENT HISTORY: Pacemaker, A-fib , thyroid -ectomy   PAIN:  Are you having pain? Yes: NPRS scale: 3/10 Pain location: knees R >L  Pain description: *** Aggravating factors: *** Relieving factors: ***     PRECAUTIONS: ICD/Pacemaker  WEIGHT BEARING RESTRICTIONS: No  FALLS:  Has patient fallen in last 6 months? Yes. Number of falls 1 going up front stairs, tripped going up stair.    PLOF: Independent  PATIENT GOALS:  improved strength in knees and hips, walking, stairs.   NEXT MD VISIT:   OBJECTIVE:   DIAGNOSTIC FINDINGS:   PATIENT SURVEYS:    COGNITION: Overall cognitive status: Within functional limits for tasks assessed     SENSATION: WFL  EDEMA:    POSTURE:    No Significant postural limitations  PALPATION:   LOWER EXTREMITY ROM:  Active /Passive ROM Left eval Right eval  Hip flexion    Hip extension    Hip abduction    Hip adduction    Hip internal rotation    Hip external rotation    Knee flexion    Knee extension    Ankle dorsiflexion    Ankle plantarflexion    Ankle inversion    Ankle eversion     (Blank rows = not tested)  LOWER EXTREMITY MMT:  MMT Left eval Right  eval  Hip flexion 4- 4/4+  Hip extension    Hip abduction 3+ 3+  Hip adduction    Hip internal rotation    Hip external rotation    Knee flexion    Knee extension    Ankle dorsiflexion    Ankle plantarflexion    Ankle inversion    Ankle eversion     (Blank rows = not tested)  LOWER EXTREMITY SPECIAL TESTS:  {LEspecialtests:26242}  FUNCTIONAL TESTS:  {Functional tests:24029}  GAIT: Distance walked: *** Assistive device utilized: {Assistive devices:23999} Level of assistance: {Levels of  assistance:24026} Comments: ***   TODAY'S TREATMENT:                                                                                                                              DATE:  PATIENT EDUCATION:  Education details: PT POC, Exam findings, HEP Person educated: Patient Education method: Explanation, Demonstration, Tactile cues, Verbal cues, and Handouts Education comprehension: verbalized understanding, returned demonstration, verbal cues required, tactile cues required, and needs further education   HOME EXERCISE PROGRAM: Access Code: [email or web address] URL: [email or web address] Date: 12/04/2023 Prepared by: Tinnie Don  Exercises - Supine Bridge  - 1 x daily - 4-5 x weekly - 2 sets - 10 reps - Clamshell  - 1 x daily - 2 sets - 10 reps - Standing Hip Abduction with Counter Support  - 1 x daily - 2 sets - 10 reps  ASSESSMENT:  CLINICAL IMPRESSION: Patient presents with primary complaint of  ***   Pt with decreased ability for full functional activities. Pt will  benefit from skilled PT to improve deficits and pain and to return to PLOF.   OBJECTIVE IMPAIRMENTS: {opptimpairments:25111}.   ACTIVITY LIMITATIONS: {activitylimitations:27494}  PARTICIPATION LIMITATIONS: {participationrestrictions:25113}  PERSONAL FACTORS: {Personal factors:25162} are also affecting patient's functional outcome.   REHAB POTENTIAL: {rehabpotential:25112}  CLINICAL DECISION MAKING: {clinical decision making:25114}  EVALUATION COMPLEXITY: {Evaluation complexity:25115}   GOALS: Goals reviewed with patient? Yes  SHORT TERM GOALS: Target date: ***  ***  Goal status: INITIAL  2.  ***  Goal status: INITIAL  3.  ***  Goal status: INITIAL    LONG TERM GOALS: Target date: ***  ***  Goal status: INITIAL  2.  ***  Goal status: INITIAL  3.  ***  Goal status: INITIAL  4.  *** Baseline:  Goal status: INITIAL  5.  ***  Goal status:  INITIAL     PLAN:  PT FREQUENCY: {rehab frequency:25116}  PT DURATION: {rehab duration:25117}  PLANNED INTERVENTIONS: Therapeutic exercises, Therapeutic activity, Neuromuscular re-education, Patient/Family education, Self Care, Joint mobilization, Joint manipulation, Stair training, Orthotic/Fit training, DME instructions, Aquatic Therapy, Dry Needling, Electrical stimulation, Cryotherapy, Moist heat, Taping, Ultrasound, Ionotophoresis 4mg /ml Dexamethasone , Manual therapy,  Vasopneumatic device, Traction, Spinal manipulation, Spinal mobilization,Balance training, Gait training,   PLAN FOR NEXT SESSION:    Tinnie Don, PT 07/05/2022, 9:15 AM

## 2023-12-05 ENCOUNTER — Encounter: Payer: Self-pay | Admitting: Physical Therapy

## 2023-12-05 NOTE — Progress Notes (Signed)
 See mychart note The 10-year ASCVD risk score (Arnett DK, et al., 2019) is: 11.7%   Values used to calculate the score:     Age: 73 years     Clincally relevant sex: Female     Is Non-Hispanic African American: No     Diabetic: No     Tobacco smoker: No     Systolic Blood Pressure: 130 mmHg     Is BP treated: No     HDL Cholesterol: 82.6 mg/dL     Total Cholesterol: 214 mg/dL    Dear Kristin [name], Your coronary calcium score fortunately looks good with a total score of 2.  Your cholesterol remains borderline but your cardiovascular risk score is elevated at almost 12%.  I am reassured by your coronary calcium score.  If you have strong feelings about not taking a statin I think this score reassures us .  However, given your elevated cardiovascular score, a statin could be started.  Please let me know your thoughts.  I think you remain in the low to intermediate risk the use of a statin Is optional at this point, but could be helpful. Sincerely, Dr. Jodie

## 2023-12-08 ENCOUNTER — Encounter: Payer: Self-pay | Admitting: Physical Therapy

## 2023-12-08 ENCOUNTER — Ambulatory Visit (INDEPENDENT_AMBULATORY_CARE_PROVIDER_SITE_OTHER): Payer: Medicare PPO

## 2023-12-08 ENCOUNTER — Ambulatory Visit: Admitting: Physical Therapy

## 2023-12-08 DIAGNOSIS — R2689 Other abnormalities of gait and mobility: Secondary | ICD-10-CM

## 2023-12-08 DIAGNOSIS — M6281 Muscle weakness (generalized): Secondary | ICD-10-CM | POA: Diagnosis not present

## 2023-12-08 DIAGNOSIS — I442 Atrioventricular block, complete: Secondary | ICD-10-CM

## 2023-12-08 NOTE — Therapy (Signed)
 OUTPATIENT PHYSICAL THERAPY LOWER EXTREMITY TREATMENT   Patient Name: Kristin [name] MRN: [id number] DOB:1950/09/21, 73 y.o., female Today's Date: 12/08/2023  END OF SESSION:  PT End of Session - 12/08/23 1327     Visit Number 2    Number of Visits 16    Date for Recertification  01/29/24    Authorization Type [employer]    PT Start Time 1148    PT Stop Time 1230    PT Time Calculation (min) 42 min    Activity Tolerance Patient tolerated treatment well    Behavior During Therapy WFL for tasks assessed/performed           Past Medical History:  Diagnosis Date   Anemia    AR (allergic rhinitis)    Atrial fibrillation (HCC) 2018   Atrial flutter (HCC)    Clotting disorder    pt denies   Diverticulosis    Esophageal ulcer    due to Motrin   GERD (gastroesophageal reflux disease)    Hiatal hernia    HLD (hyperlipidemia)    Hypothyroidism    IBS (irritable bowel syndrome)    Migraines    Mild sleep apnea    Osteoarthritis    Osteopenia after menopause 06/14/2017   DEXA 08/2016 [facility]: T = -1.10 radius, -1.0 femur; lumbar spine elevated due to DJD; recheck 2-3 years.   Pacemaker    CHB   Paroxysmal A-fib (HCC)    PUD (peptic ulcer disease)    Sleep apnea    Thyroid  nodule    Transient complete heart block 1996   Past Surgical History:  Procedure Laterality Date   ATRIAL FIBRILLATION ABLATION N/A 01/08/2019   Procedure: ATRIAL FIBRILLATION ABLATION;  Surgeon: Kelsie Agent, MD;  Location: [facility];  Service: Cardiovascular;  Laterality: N/A;   ATRIAL FIBRILLATION ABLATION N/A 04/02/2019   Procedure: ATRIAL FIBRILLATION ABLATION;  Surgeon: Kelsie Agent, MD;  Location: [facility];  Service: Cardiovascular;  Laterality: N/A;   CARDIOVERSION N/A 01/28/2019   Procedure: CARDIOVERSION;  Surgeon: Kate Lonni CROME, MD;  Location: [facility] ENDOSCOPY;  Service: Endoscopy;  Laterality: N/A;   CARDIOVERSION N/A 02/27/2019   Procedure: CARDIOVERSION;   Surgeon: Kate Lonni CROME, MD;  Location: [facility] ENDOSCOPY;  Service: Endoscopy;  Laterality: N/A;   COLONOSCOPY  2017   [place]   HIP ARTHROPLASTY Right 2008   PACEMAKER GENERATOR CHANGE  03/01/2007   performed by Dr Marcine at [facility] ([employer])   PACEMAKER INSERTION  09/08/1994   performed in [place] for transient complete heart block   PPM GENERATOR CHANGEOUT N/A 06/04/2020   Procedure: PPM GENERATOR CHANGEOUT;  Surgeon: Kelsie Agent, MD;  Location: [facility];  Service: Cardiovascular;  Laterality: N/A;   THYROIDECTOMY, PARTIAL  1986   BENIGN NODULES   TOTAL HIP ARTHROPLASTY Left 02/13/2018   Procedure: TOTAL HIP ARTHROPLASTY ANTERIOR APPROACH;  Surgeon: Ernie Cough, MD;  Location: [facility];  Service: Orthopedics;  Laterality: Left;  70 mins   TOTAL HIP REVISION Right 02/10/2022   Procedure: TOTAL HIP REVISION;  Surgeon: Ernie Cough, MD;  Location: [facility];  Service: Orthopedics;  Laterality: Right;   TUBAL LIGATION  1987   Patient Active Problem List   Diagnosis Date Noted   Presence of heart assist device (HCC) 11/22/2023   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/10/2022   Fibroid 01/11/2022   Left atrial enlargement 11/16/2020   Atrial flutter (HCC)    S/P left THA, AA 02/13/2018   OSA (obstructive  sleep apnea) 10/20/2017    Class: Chronic   Osteopenia after menopause 06/14/2017   Osteoarthritis of left hip 05/11/2017   Diverticulosis of large intestine without hemorrhage 01/04/2016   Internal hemorrhoids without complication 01/04/2016   History of complete heart block 10/06/2015   Long term current use of anticoagulant 08/05/2015   Pacemaker 10/14/2014   Paroxysmal atrial fibrillation (HCC) 10/14/2014   AR (allergic rhinitis) 01/08/2013   GERD (gastroesophageal reflux disease) 01/08/2013   Hypothyroidism, postsurgical 01/08/2013   OA (osteoarthritis) 01/08/2013   History of peptic ulcer disease - nsaid 01/08/2013    PCP: Lavern Heck  REFERRING PROVIDER: Lavern Heck   REFERRING DIAG: Gait   THERAPY DIAG:  Other abnormalities of gait and mobility  Muscle weakness (generalized)  Rationale for Evaluation and Treatment: Rehabilitation  ONSET DATE:    SUBJECTIVE:   SUBJECTIVE STATEMENT: Pt with no new complaints.    Eval: Pt states history of previous hip replacement R side, about 15 years ago. Was limping prior to surgery . Limp has not gone away .  L hip also done, and R re-done- due to faulty prostheis- recall. 02/10/2022.  Both knees are now hurting R>L.  Feels knees are weaker, difficulty going up stairs, R gives out- needs railing.  Did not have actual in person PT following last L and R hip replacements, did have some home health after the 1st.  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  She is concerned with the continued limping and would like to get stronger.    PERTINENT HISTORY: Pacemaker, A-fib , thyroid -ectomy   PAIN:  Are you having pain? Yes: NPRS scale: 3/10 Pain location: knees R >L  Pain description: sore, achey Aggravating factors: increased activity , stairs  Relieving factors: none stated    PRECAUTIONS: ICD/Pacemaker  WEIGHT BEARING RESTRICTIONS: No  FALLS:  Has patient fallen in last 6 months? Yes. Number of falls 1 going up front stairs, tripped going up stair.    PLOF: Independent  PATIENT GOALS:  improved strength in knees and hips, walking/limp, stairs.   NEXT MD VISIT:   OBJECTIVE:   DIAGNOSTIC FINDINGS:   PATIENT SURVEYS:  Will do next visit   COGNITION: Overall cognitive status: Within functional limits for tasks assessed     SENSATION: WFL  EDEMA: N/A   POSTURE:  Supine: R LE longer than L;    Standing: R hip slightly higher  PALPATION: No pain to palpate  LOWER EXTREMITY ROM: Hip ROM: WFL bil Lumbar: WFL   LOWER EXTREMITY MMT:  MMT Left eval Right  eval  Hip flexion 4- 4/4+  Hip extension    Hip abduction 3+ 3+  Hip  adduction    Hip internal rotation    Hip external rotation    Knee flexion 4+ 4+  Knee extension 4 4  Ankle dorsiflexion    Ankle plantarflexion    Ankle inversion    Ankle eversion     (Blank rows = not tested)  LOWER EXTREMITY SPECIAL TESTS:    FUNCTIONAL TESTS:    GAIT: Distance walked: 100 ft Assistive device utilized: None Level of assistance: Complete Independence Comments: trendelenburg   TODAY'S TREATMENT:  DATE:   12/08/23 Therapeutic Exercise: Aerobic: Supine: LTR x 10;   bridging 2 x 10 ; SLR 2 x 10 on R, 2 x 5 on L (slight pain in anterior hip)  S/L:  clams x 10 bil;  Hip abd (assisted) x 10 bil;  Seated:  Sit to stand x 10 (higher mat table)  regular chair x 10 (better back mechanics for higher surface)  Standing: Hip abd 2 x 10 bil - cueing for posture;  Fwd weight shifts onto R LE- staggered stance x 15;  Stretches:  Neuromuscular Re-education: Manual Therapy: Therapeutic Activity: Self Care:   PATIENT EDUCATION:  Education details: updated and reviewed HEP Person educated: Patient Education method: Explanation, Demonstration, Tactile cues, Verbal cues, and Handouts Education comprehension: verbalized understanding, returned demonstration, verbal cues required, tactile cues required, and needs further education   HOME EXERCISE PROGRAM: Access Code: [email or web address] URL: [email or web address] Date: 12/04/2023 Prepared by: Tinnie Don  Exercises - Supine Bridge  - 1 x daily - 4-5 x weekly - 2 sets - 10 reps - Clamshell  - 1 x daily - 2 sets - 10 reps - Standing Hip Abduction with Counter Support  - 1 x daily - 2 sets - 10 reps  ASSESSMENT:  CLINICAL IMPRESSION: Focus on hip strengthening for weakness and gait deficits today. Discussed use of cane in future if needed for outdoor activity to improve gait  pattern.Pt to benefit from progressive strength as tolerated. Unable to lift leg against gravity in S/L position.   Eval: Patient presents with primary complaint of  weakness and gait deficits. Pt with poor gait mechanics, with trendelenburg present due to weakness in abductors. She has had long term limp with lack of effective HEP to improve. She has decreased functional strength of LEs, and will benefit from education on this.  Pt with decreased ability for full functional activities. Pt will  benefit from skilled PT to improve deficits and pain and to return to PLOF.   OBJECTIVE IMPAIRMENTS: Abnormal gait, decreased activity tolerance, difficulty walking, decreased strength, improper body mechanics, and pain.   ACTIVITY LIMITATIONS: standing, squatting, stairs, transfers, and locomotion level  PARTICIPATION LIMITATIONS: cleaning, shopping, community activity, and yard work  PERSONAL FACTORS: Past/current experiences and Time since onset of injury/illness/exacerbation are also affecting patient's functional outcome.   REHAB POTENTIAL: Good  CLINICAL DECISION MAKING: Evolving/moderate complexity  EVALUATION COMPLEXITY: Moderate   GOALS: Goals reviewed with patient? Yes  SHORT TERM GOALS: Target date: 12/25/2023   Pt to be independent with initial HEP  Goal status: INITIAL  2.  Pt to demo ability to perform s/l hip abd against gravity x 5   Goal status: INITIAL    LONG TERM GOALS: Target date: 01/29/24  Pt to be independent with final HEP  Goal status: INITIAL  2.  Pt to demo strength of bil hips to be at max ability, to improve gait mechanics.   Goal status: INITIAL  3.  Pt to demo gait mechanics to be at max ability, with decreased trendelenburg.   Goal status: INITIAL  4.  Outcome measure goal tbd   Goal status: INITIAL   PLAN:  PT FREQUENCY: 1-2x/week  PT DURATION: 8 weeks  PLANNED INTERVENTIONS: Therapeutic exercises, Therapeutic activity, Neuromuscular  re-education, Patient/Family education, Self Care, Joint mobilization, Joint manipulation, Stair training, Orthotic/Fit training, DME instructions, Aquatic Therapy, Dry Needling, Electrical stimulation, Cryotherapy, Moist heat, Taping, Ultrasound, Ionotophoresis 4mg /ml Dexamethasone , Manual therapy,  Vasopneumatic device, Traction, Spinal manipulation, Spinal mobilization,Balance training, Gait training,  PLAN FOR NEXT SESSION: outcome measure    Tinnie Don, PT, DPT 1:28 PM  12/08/23

## 2023-12-10 ENCOUNTER — Ambulatory Visit: Payer: Self-pay | Admitting: Internal Medicine

## 2023-12-10 LAB — CUP PACEART REMOTE DEVICE CHECK
Battery Remaining Longevity: 144 mo
Battery Voltage: 3.03 V
Brady Statistic AP VP Percent: 0 %
Brady Statistic AP VS Percent: 0 %
Brady Statistic AS VP Percent: 0.01 %
Brady Statistic AS VS Percent: 99.99 %
Brady Statistic RA Percent Paced: 0 %
Brady Statistic RV Percent Paced: 0.01 %
Date Time Interrogation Session: 20251114101738
Implantable Lead Connection Status: 753985
Implantable Lead Connection Status: 753985
Implantable Lead Implant Date: 19960815
Implantable Lead Implant Date: 19960815
Implantable Lead Location: 753859
Implantable Lead Location: 753860
Implantable Lead Model: 5024
Implantable Pulse Generator Implant Date: 20220512
Lead Channel Impedance Value: 285 Ohm
Lead Channel Impedance Value: 323 Ohm
Lead Channel Impedance Value: 342 Ohm
Lead Channel Impedance Value: 475 Ohm
Lead Channel Pacing Threshold Amplitude: 1.5 V
Lead Channel Pacing Threshold Pulse Width: 0.4 ms
Lead Channel Sensing Intrinsic Amplitude: 1.375 mV
Lead Channel Sensing Intrinsic Amplitude: 1.375 mV
Lead Channel Sensing Intrinsic Amplitude: 5.875 mV
Lead Channel Sensing Intrinsic Amplitude: 5.875 mV
Lead Channel Setting Pacing Amplitude: 3.25 V
Lead Channel Setting Pacing Pulse Width: 0.4 ms
Lead Channel Setting Sensing Sensitivity: 0.9 mV
Zone Setting Status: 755011

## 2023-12-12 NOTE — Progress Notes (Signed)
 Remote PPM Transmission

## 2023-12-18 ENCOUNTER — Encounter: Payer: Self-pay | Admitting: Physical Therapy

## 2023-12-18 ENCOUNTER — Ambulatory Visit: Admitting: Physical Therapy

## 2023-12-18 DIAGNOSIS — R2689 Other abnormalities of gait and mobility: Secondary | ICD-10-CM | POA: Diagnosis not present

## 2023-12-18 DIAGNOSIS — M6281 Muscle weakness (generalized): Secondary | ICD-10-CM

## 2023-12-18 NOTE — Therapy (Signed)
 OUTPATIENT PHYSICAL THERAPY LOWER EXTREMITY TREATMENT   Patient Name: Kristin [name] MRN: [id number] DOB:[birth year], 73 y.o., female Today's Date: 12/18/2023  END OF SESSION:  PT End of Session - 12/18/23 1320     Visit Number 3    Number of Visits 16    Date for Recertification  01/29/24    Authorization Type [employer]    PT Start Time 1305    PT Stop Time 1343    PT Time Calculation (min) 38 min    Activity Tolerance Patient tolerated treatment well    Behavior During Therapy WFL for tasks assessed/performed            Past Medical History:  Diagnosis Date   Anemia    AR (allergic rhinitis)    Atrial fibrillation (HCC) 2018   Atrial flutter (HCC)    Clotting disorder    pt denies   Diverticulosis    Esophageal ulcer    due to Motrin   GERD (gastroesophageal reflux disease)    Hiatal hernia    HLD (hyperlipidemia)    Hypothyroidism    IBS (irritable bowel syndrome)    Migraines    Mild sleep apnea    Osteoarthritis    Osteopenia after menopause 06/14/2017   DEXA 08/2016 [facility]: T = -1.10 radius, -1.0 femur; lumbar spine elevated due to DJD; recheck 2-3 years.   Pacemaker    CHB   Paroxysmal A-fib (HCC)    PUD (peptic ulcer disease)    Sleep apnea    Thyroid  nodule    Transient complete heart block 1996   Past Surgical History:  Procedure Laterality Date   ATRIAL FIBRILLATION ABLATION N/A 01/08/2019   Procedure: ATRIAL FIBRILLATION ABLATION;  Surgeon: Kelsie Agent, MD;  [facility]: [facility];  Service: Cardiovascular;  Laterality: N/A;   ATRIAL FIBRILLATION ABLATION N/A 04/02/2019   Procedure: ATRIAL FIBRILLATION ABLATION;  Surgeon: Kelsie Agent, MD;  [facility]: [facility];  Service: Cardiovascular;  Laterality: N/A;   CARDIOVERSION N/A 01/28/2019   Procedure: CARDIOVERSION;  Surgeon: Kate Lonni CROME, MD;  Location: [facility] ENDOSCOPY;  Service: Endoscopy;  Laterality: N/A;   CARDIOVERSION N/A 02/27/2019   Procedure: CARDIOVERSION;   Surgeon: Kate Lonni CROME, MD;  Location: [facility] ENDOSCOPY;  Service: Endoscopy;  Laterality: N/A;   COLONOSCOPY  2017   [place]   HIP ARTHROPLASTY Right 2008   PACEMAKER GENERATOR CHANGE  03/01/2007   performed by Dr Marcine at [facility] ([employer])   PACEMAKER INSERTION  09/08/1994   performed in [place] for transient complete heart block   PPM GENERATOR CHANGEOUT N/A 06/04/2020   Procedure: PPM GENERATOR CHANGEOUT;  Surgeon: Kelsie Agent, MD;  [facility];  Service: Cardiovascular;  Laterality: N/A;   THYROIDECTOMY, PARTIAL  1986   BENIGN NODULES   TOTAL HIP ARTHROPLASTY Left 02/13/2018   Procedure: TOTAL HIP ARTHROPLASTY ANTERIOR APPROACH;  Surgeon: Ernie Cough, MD;  Location: [facility];  Service: Orthopedics;  Laterality: Left;  70 mins   TOTAL HIP REVISION Right 02/10/2022   Procedure: TOTAL HIP REVISION;  Surgeon: Ernie Cough, MD;  Location: [facility];  Service: Orthopedics;  Laterality: Right;   TUBAL LIGATION  1987   Patient Active Problem List   Diagnosis Date Noted   Presence of heart assist device (HCC) 11/22/2023   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/10/2022   Fibroid 01/11/2022   Left atrial enlargement 11/16/2020   Atrial flutter (HCC)    S/P left THA, AA 02/13/2018   OSA (  obstructive sleep apnea) 10/20/2017    Class: Chronic   Osteopenia after menopause 06/14/2017   Osteoarthritis of left hip 05/11/2017   Diverticulosis of large intestine without hemorrhage 01/04/2016   Internal hemorrhoids without complication 01/04/2016   History of complete heart block 10/06/2015   Long term current use of anticoagulant 08/05/2015   Pacemaker 10/14/2014   Paroxysmal atrial fibrillation (HCC) 10/14/2014   AR (allergic rhinitis) 01/08/2013   GERD (gastroesophageal reflux disease) 01/08/2013   Hypothyroidism, postsurgical 01/08/2013   OA (osteoarthritis) 01/08/2013   History of peptic ulcer disease - nsaid 01/08/2013    PCP: Lavern Heck  REFERRING PROVIDER: Lavern Heck   REFERRING DIAG: Gait   THERAPY DIAG:  Other abnormalities of gait and mobility  Muscle weakness (generalized)  Rationale for Evaluation and Treatment: Rehabilitation  ONSET DATE:    SUBJECTIVE:   SUBJECTIVE STATEMENT:  Pt with no new complaints. Newer soreness in upper hamstring on R at times.    Eval: Pt states history of previous hip replacement R side, about 15 years ago. Was limping prior to surgery . Limp has not gone away .  L hip also done, and R re-done- due to faulty prostheis- recall. 02/10/2022.  Both knees are now hurting R>L.  Feels knees are weaker, difficulty going up stairs, R gives out- needs railing.  Did not have actual in person PT following last L and R hip replacements, did have some home health after the 1st.  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  She is concerned with the continued limping and would like to get stronger.    PERTINENT HISTORY: Pacemaker, A-fib , thyroid -ectomy   PAIN:  Are you having pain? Yes: NPRS scale: 3/10 Pain location: knees R >L  Pain description: sore, achey Aggravating factors: increased activity , stairs  Relieving factors: none stated    PRECAUTIONS: ICD/Pacemaker  WEIGHT BEARING RESTRICTIONS: No  FALLS:  Has patient fallen in last 6 months? Yes. Number of falls 1 going up front stairs, tripped going up stair.    PLOF: Independent  PATIENT GOALS:  improved strength in knees and hips, walking/limp, stairs.   NEXT MD VISIT:   OBJECTIVE:   DIAGNOSTIC FINDINGS:   PATIENT SURVEYS:  Will do next visit   COGNITION: Overall cognitive status: Within functional limits for tasks assessed     SENSATION: WFL  EDEMA: N/A   POSTURE:  Supine: R LE longer than L;    Standing: R hip slightly higher  PALPATION: No pain to palpate  LOWER EXTREMITY ROM: Hip ROM: WFL bil Lumbar: WFL   LOWER EXTREMITY MMT:  MMT Left eval Right  eval  Hip flexion 4- 4/4+   Hip extension    Hip abduction 3+ 3+  Hip adduction    Hip internal rotation    Hip external rotation    Knee flexion 4+ 4+  Knee extension 4 4  Ankle dorsiflexion    Ankle plantarflexion    Ankle inversion    Ankle eversion     (Blank rows = not tested)  LOWER EXTREMITY SPECIAL TESTS:    FUNCTIONAL TESTS:    GAIT: Distance walked: 100 ft Assistive device utilized: None Level of assistance: Complete Independence Comments: trendelenburg   TODAY'S TREATMENT:  DATE:   12/18/23 Therapeutic Exercise: Aerobic: Supine: LTR x 10;   bridging 2 x 10 ;   SLR 2 x 10 on R,  2 x 10 on L (slight pain in L anterior hip on left by rep 8 )  S/L:   Hip abd  x 10 bil (able to do unassisted)  Seated:  Sit to stand x 10 (mat table)   Standing: Hip abd 2 x 10 bil - cueing for posture;   side stepping - cueing for mechanics and decreasing trunk sway x 10  Stretches: seated Hamstring stretch x 3 bil; piriformis x 3 bil;  Neuromuscular Re-education: Manual Therapy: Therapeutic Activity: Self Care:    Therapeutic Exercise: Aerobic: Supine: LTR x 10;   bridging 2 x 10 ; SLR 2 x 10 on R, 2 x 5 on L (slight pain in anterior hip)  S/L:  clams x 10 bil;  Hip abd (assisted) x 10 bil;  Seated:  Sit to stand x 10 (higher mat table)  regular chair x 10 (better back mechanics for higher surface)  Standing: Hip abd 2 x 10 bil - cueing for posture;  Fwd weight shifts onto R LE- staggered stance x 15;  Stretches:  Neuromuscular Re-education: Manual Therapy: Therapeutic Activity: Self Care:   PATIENT EDUCATION:  Education details: updated and reviewed HEP Person educated: Patient Education method: Explanation, Demonstration, Tactile cues, Verbal cues, and Handouts Education comprehension: verbalized understanding, returned demonstration, verbal cues required, tactile cues  required, and needs further education   HOME EXERCISE PROGRAM: Access Code: [email or web address] URL: [email or web address] Date: 12/04/2023 Prepared by: Tinnie Don  Exercises  - Supine Bridge  - 1 x daily - 4-5 x weekly - 2 sets - 10 reps - Clamshell  - 1 x daily - 2 sets - 10 reps - Standing Hip Abduction with Counter Support  - 1 x daily - 2 sets - 10 reps  ASSESSMENT:  CLINICAL IMPRESSION: Pt states mild pain in R upper hamstring. Did hamstring stretch today, with minimal discomfort, and minimal discomfort to palpate or with other exercises today. Will continue to monitor. She did have ability for active s/l hip abd today. Continued focus on hip strength for improving function and gait.   Eval: Patient presents with primary complaint of  weakness and gait deficits. Pt with poor gait mechanics, with trendelenburg present due to weakness in abductors. She has had long term limp with lack of effective HEP to improve. She has decreased functional strength of LEs, and will benefit from education on this.  Pt with decreased ability for full functional activities. Pt will  benefit from skilled PT to improve deficits and pain and to return to PLOF.   OBJECTIVE IMPAIRMENTS: Abnormal gait, decreased activity tolerance, difficulty walking, decreased strength, improper body mechanics, and pain.   ACTIVITY LIMITATIONS: standing, squatting, stairs, transfers, and locomotion level  PARTICIPATION LIMITATIONS: cleaning, shopping, community activity, and yard work  PERSONAL FACTORS: Past/current experiences and Time since onset of injury/illness/exacerbation are also affecting patient's functional outcome.   REHAB POTENTIAL: Good  CLINICAL DECISION MAKING: Evolving/moderate complexity  EVALUATION COMPLEXITY: Moderate   GOALS: Goals reviewed with patient? Yes  SHORT TERM GOALS: Target date: 12/25/2023   Pt to be independent with initial HEP  Goal status: INITIAL  2.  Pt to demo  ability to perform s/l hip abd against gravity x 5   Goal status: INITIAL    LONG TERM GOALS: Target date: 01/29/24  Pt to be independent with final HEP  Goal status: INITIAL  2.  Pt to demo strength of bil hips to be at max ability, to improve gait mechanics.   Goal status: INITIAL  3.  Pt to demo gait mechanics to be at max ability, with decreased trendelenburg.   Goal status: INITIAL  4.  Outcome measure goal tbd   Goal status: INITIAL   PLAN:  PT FREQUENCY: 1-2x/week  PT DURATION: 8 weeks  PLANNED INTERVENTIONS: Therapeutic exercises, Therapeutic activity, Neuromuscular re-education, Patient/Family education, Self Care, Joint mobilization, Joint manipulation, Stair training, Orthotic/Fit training, DME instructions, Aquatic Therapy, Dry Needling, Electrical stimulation, Cryotherapy, Moist heat, Taping, Ultrasound, Ionotophoresis 4mg /ml Dexamethasone , Manual therapy,  Vasopneumatic device, Traction, Spinal manipulation, Spinal mobilization,Balance training, Gait training,   PLAN FOR NEXT SESSION: outcome measure    Tinnie Don, PT, DPT 1:20 PM  12/18/23

## 2023-12-25 ENCOUNTER — Encounter: Payer: Self-pay | Admitting: Physical Therapy

## 2023-12-25 ENCOUNTER — Ambulatory Visit: Admitting: Physical Therapy

## 2023-12-25 DIAGNOSIS — M6281 Muscle weakness (generalized): Secondary | ICD-10-CM | POA: Diagnosis not present

## 2023-12-25 DIAGNOSIS — R2689 Other abnormalities of gait and mobility: Secondary | ICD-10-CM

## 2023-12-25 NOTE — Therapy (Unsigned)
 OUTPATIENT PHYSICAL THERAPY LOWER EXTREMITY TREATMENT   Patient Name: Kristin [name] MRN: [id number] DOB:Mar 13, 1950, 73 y.o., female Today's Date: 12/25/23   END OF SESSION:  PT End of Session - 12/25/23 1013     Visit Number 4    Number of Visits 16    Date for Recertification  01/29/24    Authorization Type [employer]    PT Start Time 1015    PT Stop Time 1100    PT Time Calculation (min) 45 min    Activity Tolerance Patient tolerated treatment well    Behavior During Therapy WFL for tasks assessed/performed            Past Medical History:  Diagnosis Date   Anemia    AR (allergic rhinitis)    Atrial fibrillation (HCC) 2018   Atrial flutter (HCC)    Clotting disorder    pt denies   Diverticulosis    Esophageal ulcer    due to Motrin   GERD (gastroesophageal reflux disease)    Hiatal hernia    HLD (hyperlipidemia)    Hypothyroidism    IBS (irritable bowel syndrome)    Migraines    Mild sleep apnea    Osteoarthritis    Osteopenia after menopause 06/14/2017   DEXA 08/2016 [facility]: T = -1.10 radius, -1.0 femur; lumbar spine elevated due to DJD; recheck 2-3 years.   Pacemaker    CHB   Paroxysmal A-fib (HCC)    PUD (peptic ulcer disease)    Sleep apnea    Thyroid  nodule    Transient complete heart block 1996   Past Surgical History:  Procedure Laterality Date   ATRIAL FIBRILLATION ABLATION N/A 01/08/2019   Procedure: ATRIAL FIBRILLATION ABLATION;  Surgeon: Kelsie Agent, MD;  [facility];  Service: Cardiovascular;  Laterality: N/A;   ATRIAL FIBRILLATION ABLATION N/A 04/02/2019   Procedure: ATRIAL FIBRILLATION ABLATION;  Surgeon: Kelsie Agent, MD;  [facility]: [facility];  Service: Cardiovascular;  Laterality: N/A;   CARDIOVERSION N/A 01/28/2019   Procedure: CARDIOVERSION;  Surgeon: Kate Lonni CROME, MD;  Location: [facility] ENDOSCOPY;  Service: Endoscopy;  Laterality: N/A;   CARDIOVERSION N/A 02/27/2019   Procedure: CARDIOVERSION;   Surgeon: Kate Lonni CROME, MD;  Location: [facility] ENDOSCOPY;  Service: Endoscopy;  Laterality: N/A;   COLONOSCOPY  2017   [place]   HIP ARTHROPLASTY Right 2008   PACEMAKER GENERATOR CHANGE  03/01/2007   performed by Dr Marcine at [facility] ([employer])   PACEMAKER INSERTION  09/08/1994   performed in [place] for transient complete heart block   PPM GENERATOR CHANGEOUT N/A 06/04/2020   Procedure: PPM GENERATOR CHANGEOUT;  Surgeon: Kelsie Agent, MD;  [facility];  Service: Cardiovascular;  Laterality: N/A;   THYROIDECTOMY, PARTIAL  1986   BENIGN NODULES   TOTAL HIP ARTHROPLASTY Left 02/13/2018   Procedure: TOTAL HIP ARTHROPLASTY ANTERIOR APPROACH;  Surgeon: Ernie Cough, MD;  Location: [facility];  Service: Orthopedics;  Laterality: Left;  70 mins   TOTAL HIP REVISION Right 02/10/2022   Procedure: TOTAL HIP REVISION;  Surgeon: Ernie Cough, MD;  Location: [facility];  Service: Orthopedics;  Laterality: Right;   TUBAL LIGATION  1987   Patient Active Problem List   Diagnosis Date Noted   Presence of heart assist device (HCC) 11/22/2023   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/10/2022   Fibroid 01/11/2022   Left atrial enlargement 11/16/2020   Atrial flutter (HCC)    S/P left THA, AA 02/13/2018  OSA (obstructive sleep apnea) 10/20/2017    Class: Chronic   Osteopenia after menopause 06/14/2017   Osteoarthritis of left hip 05/11/2017   Diverticulosis of large intestine without hemorrhage 01/04/2016   Internal hemorrhoids without complication 01/04/2016   History of complete heart block 10/06/2015   Long term current use of anticoagulant 08/05/2015   Pacemaker 10/14/2014   Paroxysmal atrial fibrillation (HCC) 10/14/2014   AR (allergic rhinitis) 01/08/2013   GERD (gastroesophageal reflux disease) 01/08/2013   Hypothyroidism, postsurgical 01/08/2013   OA (osteoarthritis) 01/08/2013   History of peptic ulcer disease - nsaid 01/08/2013    PCP: Lavern Heck  REFERRING PROVIDER: Lavern Heck   REFERRING DIAG: Gait   THERAPY DIAG:  Other abnormalities of gait and mobility  Muscle weakness (generalized)  Rationale for Evaluation and Treatment: Rehabilitation  ONSET DATE:    SUBJECTIVE:   SUBJECTIVE STATEMENT:  Pt with no new complaints. Hamstring on R a bit sore at times, mostly with sit to stand.    Eval: Pt states history of previous hip replacement R side, about 15 years ago. Was limping prior to surgery . Limp has not gone away .  L hip also done, and R re-done- due to faulty prostheis- recall. 02/10/2022.  Both knees are now hurting R>L.  Feels knees are weaker, difficulty going up stairs, R gives out- needs railing.  Did not have actual in person PT following last L and R hip replacements, did have some home health after the 1st.  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  She is concerned with the continued limping and would like to get stronger.    PERTINENT HISTORY: Pacemaker, A-fib , thyroid -ectomy   PAIN:  Are you having pain? Yes: NPRS scale: 3/10 Pain location: knees R >L  Pain description: sore, achey Aggravating factors: increased activity , stairs  Relieving factors: none stated    PRECAUTIONS: ICD/Pacemaker  WEIGHT BEARING RESTRICTIONS: No  FALLS:  Has patient fallen in last 6 months? Yes. Number of falls 1 going up front stairs, tripped going up stair.    PLOF: Independent  PATIENT GOALS:  improved strength in knees and hips, walking/limp, stairs.   NEXT MD VISIT:   OBJECTIVE:   DIAGNOSTIC FINDINGS:   PATIENT SURVEYS:  Will do next visit   COGNITION: Overall cognitive status: Within functional limits for tasks assessed     SENSATION: WFL  EDEMA: N/A   POSTURE:  Supine: R LE longer than L;    Standing: R hip slightly higher  PALPATION: No pain to palpate  LOWER EXTREMITY ROM: Hip ROM: WFL bil Lumbar: WFL   LOWER EXTREMITY MMT:  MMT Left eval Right  eval  Hip  flexion 4- 4/4+  Hip extension    Hip abduction 3+ 3+  Hip adduction    Hip internal rotation    Hip external rotation    Knee flexion 4+ 4+  Knee extension 4 4  Ankle dorsiflexion    Ankle plantarflexion    Ankle inversion    Ankle eversion     (Blank rows = not tested)  LOWER EXTREMITY SPECIAL TESTS:    FUNCTIONAL TESTS:    GAIT: Distance walked: 100 ft Assistive device utilized: None Level of assistance: Complete Independence Comments: trendelenburg   TODAY'S TREATMENT:  DATE:   12/25/23 Therapeutic Exercise: Aerobic: Supine: LTR x 10;   bridging 2 x 10 ;   SLR 2 x 10 bil;  march with GTB x 15;  S/L:   clams x 15 bil;   Hip abd 2  x 6 on R ,  2 x 8 on L;  Seated:  Sit to stand x 10,  8 lb  Standing: Hip abd 2 x 10 bil - cueing for posture;   side stepping - cueing for mechanics and decreasing trunk sway x 10  Stretches: seated Hamstring stretch x 3 bil;  Neuromuscular Re-education: Manual Therapy: Therapeutic Activity: Side stepping at counter x 6 Bwd walking in hallway 10 ft 4 Step ups 6 in x 10 bil;  Stairs- 1 Ue support x 4;  Self Care:    Therapeutic Exercise: Aerobic: Supine: LTR x 10;   bridging 2 x 10 ;   SLR 2 x 10 on R,  2 x 10 on L (slight pain in L anterior hip on left by rep 8 )  S/L:   Hip abd  x 10 bil (able to do unassisted)  Seated:  Sit to stand x 10 (mat table)   Standing: Hip abd 2 x 10 bil - cueing for posture;   side stepping - cueing for mechanics and decreasing trunk sway x 10  Stretches: seated Hamstring stretch x 3 bil; piriformis x 3 bil;  Neuromuscular Re-education: Manual Therapy: Therapeutic Activity: Self Care:    Therapeutic Exercise: Aerobic: Supine: LTR x 10;   bridging 2 x 10 ; SLR 2 x 10 on R, 2 x 5 on L (slight pain in anterior hip)  S/L:  clams x 10 bil;  Hip abd (assisted) x 10 bil;   Seated:  Sit to stand x 10 (higher mat table)  regular chair x 10 (better back mechanics for higher surface)  Standing: Hip abd 2 x 10 bil - cueing for posture;  Fwd weight shifts onto R LE- staggered stance x 15;  Stretches:  Neuromuscular Re-education: Manual Therapy: Therapeutic Activity: Self Care:   PATIENT EDUCATION:  Education details: updated and reviewed HEP Person educated: Patient Education method: Explanation, Demonstration, Tactile cues, Verbal cues, and Handouts Education comprehension: verbalized understanding, returned demonstration, verbal cues required, tactile cues required, and needs further education   HOME EXERCISE PROGRAM: Access Code: [email or web address] URL: [email or web address] Date: 12/04/2023 Prepared by: Tinnie Don  Exercises  - Supine Bridge  - 1 x daily - 4-5 x weekly - 2 sets - 10 reps - Clamshell  - 1 x daily - 2 sets - 10 reps - Standing Hip Abduction with Counter Support  - 1 x daily - 2 sets - 10 reps  ASSESSMENT:  CLINICAL IMPRESSION:   Pt states mild pain in R upper hamstring. Did hamstring stretch today, with minimal discomfort, and minimal discomfort to palpate or with other exercises today. Will continue to monitor. She did have ability for active s/l hip abd today. Continued focus on hip strength for improving function and gait.   Eval: Patient presents with primary complaint of  weakness and gait deficits. Pt with poor gait mechanics, with trendelenburg present due to weakness in abductors. She has had long term limp with lack of effective HEP to improve. She has decreased functional strength of LEs, and will benefit from education on this.  Pt with decreased ability for full functional activities. Pt will  benefit from skilled PT to improve deficits and pain and to return to  PLOF.   OBJECTIVE IMPAIRMENTS: Abnormal gait, decreased activity tolerance, difficulty walking, decreased strength, improper body mechanics, and pain.    ACTIVITY LIMITATIONS: standing, squatting, stairs, transfers, and locomotion level  PARTICIPATION LIMITATIONS: cleaning, shopping, community activity, and yard work  PERSONAL FACTORS: Past/current experiences and Time since onset of injury/illness/exacerbation are also affecting patient's functional outcome.   REHAB POTENTIAL: Good  CLINICAL DECISION MAKING: Evolving/moderate complexity  EVALUATION COMPLEXITY: Moderate   GOALS: Goals reviewed with patient? Yes  SHORT TERM GOALS: Target date: 12/25/2023   Pt to be independent with initial HEP  Goal status: INITIAL  2.  Pt to demo ability to perform s/l hip abd against gravity x 5   Goal status: INITIAL    LONG TERM GOALS: Target date: 01/29/24  Pt to be independent with final HEP  Goal status: INITIAL  2.  Pt to demo strength of bil hips to be at max ability, to improve gait mechanics.   Goal status: INITIAL  3.  Pt to demo gait mechanics to be at max ability, with decreased trendelenburg.   Goal status: INITIAL  4.  Outcome measure goal tbd   Goal status: INITIAL   PLAN:  PT FREQUENCY: 1-2x/week  PT DURATION: 8 weeks  PLANNED INTERVENTIONS: Therapeutic exercises, Therapeutic activity, Neuromuscular re-education, Patient/Family education, Self Care, Joint mobilization, Joint manipulation, Stair training, Orthotic/Fit training, DME instructions, Aquatic Therapy, Dry Needling, Electrical stimulation, Cryotherapy, Moist heat, Taping, Ultrasound, Ionotophoresis 4mg /ml Dexamethasone , Manual therapy,  Vasopneumatic device, Traction, Spinal manipulation, Spinal mobilization,Balance training, Gait training,   PLAN FOR NEXT SESSION:    Tinnie Don, PT, DPT 10:14 AM  12/25/23

## 2023-12-28 ENCOUNTER — Ambulatory Visit: Admitting: Physical Therapy

## 2023-12-28 ENCOUNTER — Encounter: Payer: Self-pay | Admitting: Physical Therapy

## 2023-12-28 ENCOUNTER — Inpatient Hospital Stay: Admission: RE | Admit: 2023-12-28 | Discharge: 2023-12-28 | Attending: Family Medicine | Admitting: Family Medicine

## 2023-12-28 DIAGNOSIS — R2689 Other abnormalities of gait and mobility: Secondary | ICD-10-CM

## 2023-12-28 DIAGNOSIS — Z1231 Encounter for screening mammogram for malignant neoplasm of breast: Secondary | ICD-10-CM

## 2023-12-28 DIAGNOSIS — M6281 Muscle weakness (generalized): Secondary | ICD-10-CM | POA: Diagnosis not present

## 2023-12-28 NOTE — Therapy (Signed)
 OUTPATIENT PHYSICAL THERAPY LOWER EXTREMITY TREATMENT   Patient Name: MARLYSS CISSELL MRN: [id number] DOB:22-Nov-1950, 73 y.o., female Today's Date: 12/28/23   END OF SESSION:  PT End of Session - 12/28/23 1419     Visit Number 5    Number of Visits 16    Date for Recertification  01/29/24    Authorization Type [employer]    PT Start Time 1415    PT Stop Time 1458    PT Time Calculation (min) 43 min    Activity Tolerance Patient tolerated treatment well    Behavior During Therapy WFL for tasks assessed/performed             Past Medical History:  Diagnosis Date   Anemia    AR (allergic rhinitis)    Atrial fibrillation (HCC) 2018   Atrial flutter (HCC)    Clotting disorder    pt denies   Diverticulosis    Esophageal ulcer    due to Motrin   GERD (gastroesophageal reflux disease)    Hiatal hernia    HLD (hyperlipidemia)    Hypothyroidism    IBS (irritable bowel syndrome)    Migraines    Mild sleep apnea    Osteoarthritis    Osteopenia after menopause 06/14/2017   DEXA 08/2016 [facility]: T = -1.10 radius, -1.0 femur; lumbar spine elevated due to DJD; recheck 2-3 years.   Pacemaker    CHB   Paroxysmal A-fib (HCC)    PUD (peptic ulcer disease)    Sleep apnea    Thyroid  nodule    Transient complete heart block 1996   Past Surgical History:  Procedure Laterality Date   ATRIAL FIBRILLATION ABLATION N/A 01/08/2019   Procedure: ATRIAL FIBRILLATION ABLATION;  Surgeon: Kelsie Agent, MD;  Location: [facility];  Service: Cardiovascular;  Laterality: N/A;   ATRIAL FIBRILLATION ABLATION N/A 04/02/2019   Procedure: ATRIAL FIBRILLATION ABLATION;  Surgeon: Kelsie Agent, MD;  Location: [facility];  Service: Cardiovascular;  Laterality: N/A;   CARDIOVERSION N/A 01/28/2019   Procedure: CARDIOVERSION;  Surgeon: Kate Lonni CROME, MD;  Location: [facility] ENDOSCOPY;  Service: Endoscopy;  Laterality: N/A;   CARDIOVERSION N/A 02/27/2019   Procedure: CARDIOVERSION;   Surgeon: Kate Lonni CROME, MD;  Location: [facility] ENDOSCOPY;  Service: Endoscopy;  Laterality: N/A;   COLONOSCOPY  2017   [place]   HIP ARTHROPLASTY Right 2008   PACEMAKER GENERATOR CHANGE  03/01/2007   performed by Dr Marcine at [facility] ([employer])   PACEMAKER INSERTION  09/08/1994   performed in [facility] for transient complete heart block   PPM GENERATOR CHANGEOUT N/A 06/04/2020   Procedure: PPM GENERATOR CHANGEOUT;  Surgeon: Kelsie Agent, MD;  [facility]: [facility];  Service: Cardiovascular;  Laterality: N/A;   THYROIDECTOMY, PARTIAL  1986   BENIGN NODULES   TOTAL HIP ARTHROPLASTY Left 02/13/2018   Procedure: TOTAL HIP ARTHROPLASTY ANTERIOR APPROACH;  Surgeon: Ernie Cough, MD;  Location: [facility];  Service: Orthopedics;  Laterality: Left;  70 mins   TOTAL HIP REVISION Right 02/10/2022   Procedure: TOTAL HIP REVISION;  Surgeon: Ernie Cough, MD;  Location: [facility];  Service: Orthopedics;  Laterality: Right;   TUBAL LIGATION  1987   Patient Active Problem List   Diagnosis Date Noted   Presence of heart assist device (HCC) 11/22/2023   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/10/2022   Fibroid 01/11/2022   Left atrial enlargement 11/16/2020   Atrial flutter (HCC)    S/P left THA, AA 02/13/2018  OSA (obstructive sleep apnea) 10/20/2017    Class: Chronic   Osteopenia after menopause 06/14/2017   Osteoarthritis of left hip 05/11/2017   Diverticulosis of large intestine without hemorrhage 01/04/2016   Internal hemorrhoids without complication 01/04/2016   History of complete heart block 10/06/2015   Long term current use of anticoagulant 08/05/2015   Pacemaker 10/14/2014   Paroxysmal atrial fibrillation (HCC) 10/14/2014   AR (allergic rhinitis) 01/08/2013   GERD (gastroesophageal reflux disease) 01/08/2013   Hypothyroidism, postsurgical 01/08/2013   OA (osteoarthritis) 01/08/2013   History of peptic ulcer disease - nsaid 01/08/2013    PCP: Lavern Heck  REFERRING PROVIDER: Lavern Heck   REFERRING DIAG: Gait   THERAPY DIAG:  Other abnormalities of gait and mobility  Muscle weakness (generalized)  Rationale for Evaluation and Treatment: Rehabilitation  ONSET DATE:    SUBJECTIVE:   SUBJECTIVE STATEMENT:  Pt with no new complaints. Hamstring on R a bit sore at times, mostly with sit to stand, but has been less often.   Eval: Pt states history of previous hip replacement R side, about 15 years ago. Was limping prior to surgery . Limp has not gone away .  L hip also done, and R re-done- due to faulty prostheis- recall. 02/10/2022.  Both knees are now hurting R>L.  Feels knees are weaker, difficulty going up stairs, R gives out- needs railing.  Did not have actual in person PT following last L and R hip replacements, did have some home health after the 1st.  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  She is concerned with the continued limping and would like to get stronger.    PERTINENT HISTORY: Pacemaker, A-fib , thyroid -ectomy   PAIN:  Are you having pain? Yes: NPRS scale: 3/10 Pain location: knees R >L  Pain description: sore, achey Aggravating factors: increased activity , stairs  Relieving factors: none stated    PRECAUTIONS: ICD/Pacemaker  WEIGHT BEARING RESTRICTIONS: No  FALLS:  Has patient fallen in last 6 months? Yes. Number of falls 1 going up front stairs, tripped going up stair.    PLOF: Independent  PATIENT GOALS:  improved strength in knees and hips, walking/limp, stairs.   NEXT MD VISIT:   OBJECTIVE:   DIAGNOSTIC FINDINGS:   PATIENT SURVEYS:  Will do next visit   COGNITION: Overall cognitive status: Within functional limits for tasks assessed     SENSATION: WFL  EDEMA: N/A   POSTURE:  Supine: R LE longer than L;    Standing: R hip slightly higher  PALPATION: No pain to palpate  LOWER EXTREMITY ROM: Hip ROM: WFL bil Lumbar: WFL   LOWER EXTREMITY MMT:  MMT Left eval  Right  eval  Hip flexion 4- 4/4+  Hip extension    Hip abduction 3+ 3+  Hip adduction    Hip internal rotation    Hip external rotation    Knee flexion 4+ 4+  Knee extension 4 4  Ankle dorsiflexion    Ankle plantarflexion    Ankle inversion    Ankle eversion     (Blank rows = not tested)  LOWER EXTREMITY SPECIAL TESTS:    FUNCTIONAL TESTS:    GAIT: Distance walked: 100 ft Assistive device utilized: None Level of assistance: Complete Independence Comments: trendelenburg   TODAY'S TREATMENT:  DATE:   12/28/23 Therapeutic Exercise: Aerobic: Bike L2 x 6 min  Supine: LTR x 10;   bridging 2 x 10;  SLR 2 x 10 bil;  S/L:   clams x 15 bil;    Hip abd 2  x 6 on R ,  2 x 8 on L;  Seated:   Standing: Hip abd 2 x 10 bil    Stretches: seated Hamstring stretch on Step  x 3 bil;  Neuromuscular Re-education: Manual Therapy: Therapeutic Activity: Squats 10 lb x 15;  Side stepping at counter x 6 Bwd walking in hallway 10 ft 4 Step ups 6 in x 10 bil;  Self Care:    Therapeutic Exercise: Aerobic: Supine: LTR x 10;   bridging 2 x 10 ;   SLR 2 x 10 on R,  2 x 10 on L (slight pain in L anterior hip on left by rep 8 )  S/L:   Hip abd  x 10 bil (able to do unassisted)  Seated:  Sit to stand x 10 (mat table)   Standing: Hip abd 2 x 10 bil - cueing for posture;   side stepping - cueing for mechanics and decreasing trunk sway x 10  Stretches: seated Hamstring stretch x 3 bil; piriformis x 3 bil;  Neuromuscular Re-education: Manual Therapy: Therapeutic Activity: Self Care:    Therapeutic Exercise: Aerobic: Supine: LTR x 10;   bridging 2 x 10 ; SLR 2 x 10 on R, 2 x 5 on L (slight pain in anterior hip)  S/L:  clams x 10 bil;  Hip abd (assisted) x 10 bil;  Seated:  Sit to stand x 10 (higher mat table)  regular chair x 10 (better back mechanics for higher  surface)  Standing: Hip abd 2 x 10 bil - cueing for posture;  Fwd weight shifts onto R LE- staggered stance x 15;  Stretches:  Neuromuscular Re-education: Manual Therapy: Therapeutic Activity: Self Care:   PATIENT EDUCATION:  Education details: updated and reviewed HEP Person educated: Patient Education method: Explanation, Demonstration, Tactile cues, Verbal cues, and Handouts Education comprehension: verbalized understanding, returned demonstration, verbal cues required, tactile cues required, and needs further education   HOME EXERCISE PROGRAM: Access Code: [email or web address] URL: [email or web address] Date: 12/04/2023 Prepared by: Tinnie Don  Exercises  - Supine Bridge  - 1 x daily - 4-5 x weekly - 2 sets - 10 reps - Clamshell  - 1 x daily - 2 sets - 10 reps - Standing Hip Abduction with Counter Support  - 1 x daily - 2 sets - 10 reps  ASSESSMENT:  CLINICAL IMPRESSION: Pt showing mild improvements in ability for strengthening exercises. Gait has not improved yet, but expected to take longer due to amount of weakness.   Eval: Patient presents with primary complaint of  weakness and gait deficits. Pt with poor gait mechanics, with trendelenburg present due to weakness in abductors. She has had long term limp with lack of effective HEP to improve. She has decreased functional strength of LEs, and will benefit from education on this.  Pt with decreased ability for full functional activities. Pt will  benefit from skilled PT to improve deficits and pain and to return to PLOF.   OBJECTIVE IMPAIRMENTS: Abnormal gait, decreased activity tolerance, difficulty walking, decreased strength, improper body mechanics, and pain.   ACTIVITY LIMITATIONS: standing, squatting, stairs, transfers, and locomotion level  PARTICIPATION LIMITATIONS: cleaning, shopping, community activity, and yard work  PERSONAL FACTORS: Past/current experiences and Time since onset of  injury/illness/exacerbation are also affecting patient's functional outcome.   REHAB POTENTIAL: Good  CLINICAL DECISION MAKING: Evolving/moderate complexity  EVALUATION COMPLEXITY: Moderate   GOALS: Goals reviewed with patient? Yes  SHORT TERM GOALS: Target date: 12/25/2023   Pt to be independent with initial HEP  Goal status: MET  2.  Pt to demo ability to perform s/l hip abd against gravity x 5   Goal status: MET    LONG TERM GOALS: Target date: 01/29/24  Pt to be independent with final HEP  Goal status: INITIAL  2.  Pt to demo strength of bil hips to be at max ability, to improve gait mechanics.   Goal status: INITIAL  3.  Pt to demo gait mechanics to be at max ability, with decreased trendelenburg.   Goal status: INITIAL  4.  Outcome measure goal tbd   Goal status: INITIAL   PLAN:  PT FREQUENCY: 1-2x/week  PT DURATION: 8 weeks  PLANNED INTERVENTIONS: Therapeutic exercises, Therapeutic activity, Neuromuscular re-education, Patient/Family education, Self Care, Joint mobilization, Joint manipulation, Stair training, Orthotic/Fit training, DME instructions, Aquatic Therapy, Dry Needling, Electrical stimulation, Cryotherapy, Moist heat, Taping, Ultrasound, Ionotophoresis 4mg /ml Dexamethasone , Manual therapy,  Vasopneumatic device, Traction, Spinal manipulation, Spinal mobilization,Balance training, Gait training,   PLAN FOR NEXT SESSION: add band for s/l clams   Tinnie Don, PT, DPT 7:46 PM  12/28/23

## 2024-01-01 ENCOUNTER — Encounter: Payer: Self-pay | Admitting: Physical Therapy

## 2024-01-01 ENCOUNTER — Ambulatory Visit: Admitting: Physical Therapy

## 2024-01-01 DIAGNOSIS — R2689 Other abnormalities of gait and mobility: Secondary | ICD-10-CM

## 2024-01-01 DIAGNOSIS — M6281 Muscle weakness (generalized): Secondary | ICD-10-CM | POA: Diagnosis not present

## 2024-01-01 NOTE — Therapy (Signed)
 OUTPATIENT PHYSICAL THERAPY LOWER EXTREMITY TREATMENT   Patient Name: Kristin [name] MRN: [id number] DOB:February 25, 1950, 73 y.o., female Today's Date: 01/01/24   END OF SESSION:  PT End of Session - 01/01/24 1152     Visit Number 6    Number of Visits 16    Date for Recertification  01/29/24    Authorization Type [employer]    PT Start Time 1148    PT Stop Time 1230    PT Time Calculation (min) 42 min    Activity Tolerance Patient tolerated treatment well    Behavior During Therapy WFL for tasks assessed/performed             Past Medical History:  Diagnosis Date   Anemia    AR (allergic rhinitis)    Atrial fibrillation (HCC) 2018   Atrial flutter (HCC)    Clotting disorder    pt denies   Diverticulosis    Esophageal ulcer    due to Motrin   GERD (gastroesophageal reflux disease)    Hiatal hernia    HLD (hyperlipidemia)    Hypothyroidism    IBS (irritable bowel syndrome)    Migraines    Mild sleep apnea    Osteoarthritis    Osteopenia after menopause 06/14/2017   DEXA 08/2016 [facility]: T = -1.10 radius, -1.0 femur; lumbar spine elevated due to DJD; recheck 2-3 years.   Pacemaker    CHB   Paroxysmal A-fib (HCC)    PUD (peptic ulcer disease)    Sleep apnea    Thyroid  nodule    Transient complete heart block 1996   Past Surgical History:  Procedure Laterality Date   ATRIAL FIBRILLATION ABLATION N/A 01/08/2019   Procedure: ATRIAL FIBRILLATION ABLATION;  Surgeon: Kelsie Agent, MD;  [facility]: [facility];  Service: Cardiovascular;  Laterality: N/A;   ATRIAL FIBRILLATION ABLATION N/A 04/02/2019   Procedure: ATRIAL FIBRILLATION ABLATION;  Surgeon: Kelsie Agent, MD;  [facility]: [facility];  Service: Cardiovascular;  Laterality: N/A;   CARDIOVERSION N/A 01/28/2019   Procedure: CARDIOVERSION;  Surgeon: Kate Lonni CROME, MD;  Location: [facility] ENDOSCOPY;  Service: Endoscopy;  Laterality: N/A;   CARDIOVERSION N/A 02/27/2019   Procedure: CARDIOVERSION;   Surgeon: Kate Lonni CROME, MD;  Location: [facility] ENDOSCOPY;  Service: Endoscopy;  Laterality: N/A;   COLONOSCOPY  2017   [place]   HIP ARTHROPLASTY Right 2008   PACEMAKER GENERATOR CHANGE  03/01/2007   performed by Dr Marcine at [facility] ([employer])   PACEMAKER INSERTION  09/08/1994   performed in [place] for transient complete heart block   PPM GENERATOR CHANGEOUT N/A 06/04/2020   Procedure: PPM GENERATOR CHANGEOUT;  Surgeon: Kelsie Agent, MD;  [facility]: [facility];  Service: Cardiovascular;  Laterality: N/A;   THYROIDECTOMY, PARTIAL  1986   BENIGN NODULES   TOTAL HIP ARTHROPLASTY Left 02/13/2018   Procedure: TOTAL HIP ARTHROPLASTY ANTERIOR APPROACH;  Surgeon: Ernie Cough, MD;  Location: [facility];  Service: Orthopedics;  Laterality: Left;  70 mins   TOTAL HIP REVISION Right 02/10/2022   Procedure: TOTAL HIP REVISION;  Surgeon: Ernie Cough, MD;  Location: [facility];  Service: Orthopedics;  Laterality: Right;   TUBAL LIGATION  1987   Patient Active Problem List   Diagnosis Date Noted   Presence of heart assist device (HCC) 11/22/2023   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/10/2022   Fibroid 01/11/2022   Left atrial enlargement 11/16/2020   Atrial flutter (HCC)    S/P left THA, AA 02/13/2018  OSA (obstructive sleep apnea) 10/20/2017    Class: Chronic   Osteopenia after menopause 06/14/2017   Osteoarthritis of left hip 05/11/2017   Diverticulosis of large intestine without hemorrhage 01/04/2016   Internal hemorrhoids without complication 01/04/2016   History of complete heart block 10/06/2015   Long term current use of anticoagulant 08/05/2015   Pacemaker 10/14/2014   Paroxysmal atrial fibrillation (HCC) 10/14/2014   AR (allergic rhinitis) 01/08/2013   GERD (gastroesophageal reflux disease) 01/08/2013   Hypothyroidism, postsurgical 01/08/2013   OA (osteoarthritis) 01/08/2013   History of peptic ulcer disease - nsaid 01/08/2013    PCP: Lavern Heck  REFERRING PROVIDER: Lavern Heck   REFERRING DIAG: Gait   THERAPY DIAG:  Other abnormalities of gait and mobility  Muscle weakness (generalized)  Rationale for Evaluation and Treatment: Rehabilitation  ONSET DATE:    SUBJECTIVE:   SUBJECTIVE STATEMENT:  Pt with no new complaints.   Eval: Pt states history of previous hip replacement R side, about 15 years ago. Was limping prior to surgery . Limp has not gone away .  L hip also done, and R re-done- due to faulty prostheis- recall. 02/10/2022.  Both knees are now hurting R>L.  Feels knees are weaker, difficulty going up stairs, R gives out- needs railing.  Did not have actual in person PT following last L and R hip replacements, did have some home health after the 1st.  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  She is concerned with the continued limping and would like to get stronger.    PERTINENT HISTORY: Pacemaker, A-fib , thyroid -ectomy   PAIN:  Are you having pain? Yes: NPRS scale: 3/10 Pain location: knees R >L  Pain description: sore, achey Aggravating factors: increased activity , stairs  Relieving factors: none stated    PRECAUTIONS: ICD/Pacemaker  WEIGHT BEARING RESTRICTIONS: No  FALLS:  Has patient fallen in last 6 months? Yes. Number of falls 1 going up front stairs, tripped going up stair.    PLOF: Independent  PATIENT GOALS:  improved strength in knees and hips, walking/limp, stairs.   NEXT MD VISIT:   OBJECTIVE:   DIAGNOSTIC FINDINGS:   PATIENT SURVEYS:  Will do next visit   COGNITION: Overall cognitive status: Within functional limits for tasks assessed     SENSATION: WFL  EDEMA: N/A   POSTURE:  Supine: R LE longer than L;    Standing: R hip slightly higher  PALPATION: No pain to palpate  LOWER EXTREMITY ROM: Hip ROM: WFL bil Lumbar: WFL   LOWER EXTREMITY MMT:  MMT Left eval Right  eval  Hip flexion 4- 4/4+  Hip extension    Hip abduction 3+ 3+  Hip  adduction    Hip internal rotation    Hip external rotation    Knee flexion 4+ 4+  Knee extension 4 4  Ankle dorsiflexion    Ankle plantarflexion    Ankle inversion    Ankle eversion     (Blank rows = not tested)  LOWER EXTREMITY SPECIAL TESTS:    FUNCTIONAL TESTS:    GAIT: Distance walked: 100 ft Assistive device utilized: None Level of assistance: Complete Independence Comments: trendelenburg   TODAY'S TREATMENT:  DATE:   01/01/24 Therapeutic Exercise: Aerobic: Bike L2 x 7  min  Supine: LTR x 10;    SLR x 10 bil;  small circles x 10 bil;  S/L:    Hip abd 2  x 6 on R ,  2 x 8 on L;  Seated:   Standing: Hip abd 2 x 10 bil    Stretches: Neuromuscular Re-education: Weight shifts-staggered stance x 15 bil;  Step throughs 2 x 10 bil;  Slow walking in hallway- for control of hips and core x 8  Side stepping at counter x 6 Manual Therapy: Therapeutic Activity: Squats x 10;  Step ups 6 in x 10 bil; 1 Ue light support  Self Care:    Therapeutic Exercise: Aerobic: Supine: LTR x 10;   bridging 2 x 10 ;   SLR 2 x 10 on R,  2 x 10 on L (slight pain in L anterior hip on left by rep 8 )  S/L:   Hip abd  x 10 bil (able to do unassisted)  Seated:  Sit to stand x 10 (mat table)   Standing: Hip abd 2 x 10 bil - cueing for posture;   side stepping - cueing for mechanics and decreasing trunk sway x 10  Stretches: seated Hamstring stretch x 3 bil; piriformis x 3 bil;  Neuromuscular Re-education: Manual Therapy: Therapeutic Activity: Self Care:    Therapeutic Exercise: Aerobic: Supine: LTR x 10;   bridging 2 x 10 ; SLR 2 x 10 on R, 2 x 5 on L (slight pain in anterior hip)  S/L:  clams x 10 bil;  Hip abd (assisted) x 10 bil;  Seated:  Sit to stand x 10 (higher mat table)  regular chair x 10 (better back mechanics for higher surface)  Standing: Hip abd  2 x 10 bil - cueing for posture;  Fwd weight shifts onto R LE- staggered stance x 15;  Stretches:  Neuromuscular Re-education: Manual Therapy: Therapeutic Activity: Self Care:   PATIENT EDUCATION:  Education details: updated and reviewed HEP Person educated: Patient Education method: Explanation, Demonstration, Tactile cues, Verbal cues, and Handouts Education comprehension: verbalized understanding, returned demonstration, verbal cues required, tactile cues required, and needs further education   HOME EXERCISE PROGRAM: Access Code: [email or web address] URL: [email or web address] Date: 12/04/2023 Prepared by: Tinnie Don  Exercises  - Supine Bridge  - 1 x daily - 4-5 x weekly - 2 sets - 10 reps - Clamshell  - 1 x daily - 2 sets - 10 reps - Standing Hip Abduction with Counter Support  - 1 x daily - 2 sets - 10 reps  ASSESSMENT:  CLINICAL IMPRESSION: Pt showing mild improvements in ability for strengthening exercises. She did well with step throughs and slow gait today, with much less sway/trendelenburg. Discussed continuing slow/controlled walking for practice at home.   Eval: Patient presents with primary complaint of  weakness and gait deficits. Pt with poor gait mechanics, with trendelenburg present due to weakness in abductors. She has had long term limp with lack of effective HEP to improve. She has decreased functional strength of LEs, and will benefit from education on this.  Pt with decreased ability for full functional activities. Pt will  benefit from skilled PT to improve deficits and pain and to return to PLOF.   OBJECTIVE IMPAIRMENTS: Abnormal gait, decreased activity tolerance, difficulty walking, decreased strength, improper body mechanics, and pain.   ACTIVITY LIMITATIONS: standing, squatting, stairs, transfers, and locomotion level  PARTICIPATION LIMITATIONS: cleaning, shopping,  community activity, and yard work  PERSONAL FACTORS: Past/current experiences  and Time since onset of injury/illness/exacerbation are also affecting patient's functional outcome.   REHAB POTENTIAL: Good  CLINICAL DECISION MAKING: Evolving/moderate complexity  EVALUATION COMPLEXITY: Moderate   GOALS: Goals reviewed with patient? Yes  SHORT TERM GOALS: Target date: 12/25/2023   Pt to be independent with initial HEP  Goal status: MET  2.  Pt to demo ability to perform s/l hip abd against gravity x 5   Goal status: MET    LONG TERM GOALS: Target date: 01/29/24  Pt to be independent with final HEP  Goal status: INITIAL  2.  Pt to demo strength of bil hips to be at max ability, to improve gait mechanics.   Goal status: INITIAL  3.  Pt to demo gait mechanics to be at max ability, with decreased trendelenburg.   Goal status: INITIAL  4.  Outcome measure goal tbd   Goal status: INITIAL   PLAN:  PT FREQUENCY: 1-2x/week  PT DURATION: 8 weeks  PLANNED INTERVENTIONS: Therapeutic exercises, Therapeutic activity, Neuromuscular re-education, Patient/Family education, Self Care, Joint mobilization, Joint manipulation, Stair training, Orthotic/Fit training, DME instructions, Aquatic Therapy, Dry Needling, Electrical stimulation, Cryotherapy, Moist heat, Taping, Ultrasound, Ionotophoresis 4mg /ml Dexamethasone , Manual therapy,  Vasopneumatic device, Traction, Spinal manipulation, Spinal mobilization,Balance training, Gait training,   PLAN FOR NEXT SESSION:  add band for s/l clams, slow walking, step throughs,    Tinnie Don, PT, DPT 1:23 PM  01/01/24

## 2024-01-04 ENCOUNTER — Ambulatory Visit: Admitting: Physical Therapy

## 2024-01-04 ENCOUNTER — Encounter: Payer: Self-pay | Admitting: Physical Therapy

## 2024-01-04 DIAGNOSIS — R2689 Other abnormalities of gait and mobility: Secondary | ICD-10-CM

## 2024-01-04 DIAGNOSIS — M6281 Muscle weakness (generalized): Secondary | ICD-10-CM

## 2024-01-04 NOTE — Therapy (Signed)
 OUTPATIENT PHYSICAL THERAPY LOWER EXTREMITY TREATMENT   Patient Name: TRACE CEDERBERG MRN: [id number] DOB:[birth year], 73 y.o., female Today's Date: 01/04/2024   END OF SESSION:  PT End of Session - 01/04/24 1434     Visit Number 7    Number of Visits 16    Date for Recertification  01/29/24    Authorization Type [employer]    PT Start Time 1435    PT Stop Time 1515    PT Time Calculation (min) 40 min    Activity Tolerance Patient tolerated treatment well    Behavior During Therapy WFL for tasks assessed/performed             Past Medical History:  Diagnosis Date   Anemia    AR (allergic rhinitis)    Atrial fibrillation (HCC) 2018   Atrial flutter (HCC)    Clotting disorder    pt denies   Diverticulosis    Esophageal ulcer    due to Motrin   GERD (gastroesophageal reflux disease)    Hiatal hernia    HLD (hyperlipidemia)    Hypothyroidism    IBS (irritable bowel syndrome)    Migraines    Mild sleep apnea    Osteoarthritis    Osteopenia after menopause 06/14/2017   DEXA 08/2016 [facility]: T = -1.10 radius, -1.0 femur; lumbar spine elevated due to DJD; recheck 2-3 years.   Pacemaker    CHB   Paroxysmal A-fib (HCC)    PUD (peptic ulcer disease)    Sleep apnea    Thyroid  nodule    Transient complete heart block 1996   Past Surgical History:  Procedure Laterality Date   ATRIAL FIBRILLATION ABLATION N/A 01/08/2019   Procedure: ATRIAL FIBRILLATION ABLATION;  Surgeon: Kelsie Agent, MD;  Location: [facility];  Service: Cardiovascular;  Laterality: N/A;   ATRIAL FIBRILLATION ABLATION N/A 04/02/2019   Procedure: ATRIAL FIBRILLATION ABLATION;  Surgeon: Kelsie Agent, MD;  Location: [facility];  Service: Cardiovascular;  Laterality: N/A;   CARDIOVERSION N/A 01/28/2019   Procedure: CARDIOVERSION;  Surgeon: Kate Lonni CROME, MD;  Location: [facility] ENDOSCOPY;  Service: Endoscopy;  Laterality: N/A;   CARDIOVERSION N/A 02/27/2019   Procedure: CARDIOVERSION;   Surgeon: Kate Lonni CROME, MD;  Location: [facility] ENDOSCOPY;  Service: Endoscopy;  Laterality: N/A;   COLONOSCOPY  2017      HIP ARTHROPLASTY Right 2008   PACEMAKER GENERATOR CHANGE  03/01/2007   performed by Dr Marcine at [facility] ([employer])   PACEMAKER INSERTION  09/08/1994   performed in [place] for transient complete heart block   PPM GENERATOR CHANGEOUT N/A 06/04/2020   Procedure: PPM GENERATOR CHANGEOUT;  Surgeon: Kelsie Agent, MD;  Location: [facility];  Service: Cardiovascular;  Laterality: N/A;   THYROIDECTOMY, PARTIAL  1986   BENIGN NODULES   TOTAL HIP ARTHROPLASTY Left 02/13/2018   Procedure: TOTAL HIP ARTHROPLASTY ANTERIOR APPROACH;  Surgeon: Ernie Cough, MD;  Location: [facility];  Service: Orthopedics;  Laterality: Left;  70 mins   TOTAL HIP REVISION Right 02/10/2022   Procedure: TOTAL HIP REVISION;  Surgeon: Ernie Cough, MD;  Location: [facility];  Service: Orthopedics;  Laterality: Right;   TUBAL LIGATION  1987   Patient Active Problem List   Diagnosis Date Noted   Presence of heart assist device (HCC) 11/22/2023   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/10/2022   Fibroid 01/11/2022   Left atrial enlargement 11/16/2020   Atrial flutter (HCC)    S/P left THA, AA 02/13/2018  OSA (obstructive sleep apnea) 10/20/2017    Class: Chronic   Osteopenia after menopause 06/14/2017   Osteoarthritis of left hip 05/11/2017   Diverticulosis of large intestine without hemorrhage 01/04/2016   Internal hemorrhoids without complication 01/04/2016   History of complete heart block 10/06/2015   Long term current use of anticoagulant 08/05/2015   Pacemaker 10/14/2014   Paroxysmal atrial fibrillation (HCC) 10/14/2014   AR (allergic rhinitis) 01/08/2013   GERD (gastroesophageal reflux disease) 01/08/2013   Hypothyroidism, postsurgical 01/08/2013   OA (osteoarthritis) 01/08/2013   History of peptic ulcer disease - nsaid 01/08/2013    PCP: Lavern Heck  REFERRING PROVIDER: Lavern Heck   REFERRING DIAG: Gait   THERAPY DIAG:  Other abnormalities of gait and mobility  Muscle weakness (generalized)  Rationale for Evaluation and Treatment: Rehabilitation  ONSET DATE:    SUBJECTIVE:   SUBJECTIVE STATEMENT:  Pt with no new complaints.   Eval: Pt states history of previous hip replacement R side, about 15 years ago. Was limping prior to surgery . Limp has not gone away .  L hip also done, and R re-done- due to faulty prostheis- recall. 02/10/2022.  Both knees are now hurting R>L.  Feels knees are weaker, difficulty going up stairs, R gives out- needs railing.  Did not have actual in person PT following last L and R hip replacements, did have some home health after the 1st.  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  She is concerned with the continued limping and would like to get stronger.    PERTINENT HISTORY: Pacemaker, A-fib , thyroid -ectomy   PAIN:  Are you having pain? Yes: NPRS scale: 3/10 Pain location: knees R >L  Pain description: sore, achey Aggravating factors: increased activity , stairs  Relieving factors: none stated    PRECAUTIONS: ICD/Pacemaker  WEIGHT BEARING RESTRICTIONS: No  FALLS:  Has patient fallen in last 6 months? Yes. Number of falls 1 going up front stairs, tripped going up stair.    PLOF: Independent  PATIENT GOALS:  improved strength in knees and hips, walking/limp, stairs.   NEXT MD VISIT:   OBJECTIVE:   DIAGNOSTIC FINDINGS:   PATIENT SURVEYS:  Will do next visit   COGNITION: Overall cognitive status: Within functional limits for tasks assessed     SENSATION: WFL  EDEMA: N/A   POSTURE:  Supine: R LE longer than L;    Standing: R hip slightly higher  PALPATION: No pain to palpate  LOWER EXTREMITY ROM: Hip ROM: WFL bil Lumbar: WFL   LOWER EXTREMITY MMT:  MMT Left eval Right  eval  Hip flexion 4- 4/4+  Hip extension    Hip abduction 3+ 3+  Hip  adduction    Hip internal rotation    Hip external rotation    Knee flexion 4+ 4+  Knee extension 4 4  Ankle dorsiflexion    Ankle plantarflexion    Ankle inversion    Ankle eversion     (Blank rows = not tested)  LOWER EXTREMITY SPECIAL TESTS:    FUNCTIONAL TESTS:    GAIT: Distance walked: 100 ft Assistive device utilized: None Level of assistance: Complete Independence Comments: trendelenburg   TODAY'S TREATMENT:  DATE:   01/04/2024 Therapeutic Exercise: Aerobic: Bike L2 x 7  min  Supine: LTR x 10;    SLR x 10 bil;   small circles x 10 cw, ccw, bil;  S/L:   Hip abd 2  x 8 bil  Seated:   Standing: Hip abd 2 x 10 bil   ; Squats 3 x 5 (7lb )   Stretches: Neuromuscular Re-education: Weight shifts-staggered stance x 15 bil;  Step throughs 2 x 10 bil;  Slow walking in hallway- for control of hips and core x 8  Side stepping at counter x 6 Manual Therapy: Therapeutic Activity: Squats 3 x 5 (7lb )    Self Care:     Therapeutic Exercise: Aerobic: Bike L2 x 7  min  Supine: LTR x 10;    SLR x 10 bil;  small circles x 10 bil;  S/L:    Hip abd 2  x 6 on R ,  2 x 8 on L;  Seated:   Standing: Hip abd 2 x 10 bil    Stretches: Neuromuscular Re-education: Weight shifts-staggered stance x 15 bil;  Step throughs 2 x 10 bil;  Slow walking in hallway- for control of hips and core x 8  Side stepping at counter x 6 Manual Therapy: Therapeutic Activity: Squats x 10;  Step ups 6 in x 10 bil; 1 Ue light support  Self Care:    Therapeutic Exercise: Aerobic: Supine: LTR x 10;   bridging 2 x 10 ;   SLR 2 x 10 on R,  2 x 10 on L (slight pain in L anterior hip on left by rep 8 )  S/L:   Hip abd  x 10 bil (able to do unassisted)  Seated:  Sit to stand x 10 (mat table)   Standing: Hip abd 2 x 10 bil - cueing for posture;   side stepping - cueing for  mechanics and decreasing trunk sway x 10  Stretches: seated Hamstring stretch x 3 bil; piriformis x 3 bil;  Neuromuscular Re-education: Manual Therapy: Therapeutic Activity: Self Care:    Therapeutic Exercise: Aerobic: Supine: LTR x 10;   bridging 2 x 10 ; SLR 2 x 10 on R, 2 x 5 on L (slight pain in anterior hip)  S/L:  clams x 10 bil;  Hip abd (assisted) x 10 bil;  Seated:  Sit to stand x 10 (higher mat table)  regular chair x 10 (better back mechanics for higher surface)  Standing: Hip abd 2 x 10 bil - cueing for posture;  Fwd weight shifts onto R LE- staggered stance x 15;  Stretches:  Neuromuscular Re-education: Manual Therapy: Therapeutic Activity: Self Care:   PATIENT EDUCATION:  Education details: updated and reviewed HEP Person educated: Patient Education method: Explanation, Demonstration, Tactile cues, Verbal cues, and Handouts Education comprehension: verbalized understanding, returned demonstration, verbal cues required, tactile cues required, and needs further education   HOME EXERCISE PROGRAM: Access Code: [email or web address]   ASSESSMENT:  CLINICAL IMPRESSION: Continued focus on hip strength and gait mechanics. Pt will continue to benefit from focus on this. We we discussed possible cane use in the future as an option for gait mechanics and efficiency.   Eval: Patient presents with primary complaint of  weakness and gait deficits. Pt with poor gait mechanics, with trendelenburg present due to weakness in abductors. She has had long term limp with lack of effective HEP to improve. She has decreased functional strength of LEs, and will benefit from education  on this.  Pt with decreased ability for full functional activities. Pt will  benefit from skilled PT to improve deficits and pain and to return to PLOF.   OBJECTIVE IMPAIRMENTS: Abnormal gait, decreased activity tolerance, difficulty walking, decreased strength, improper body mechanics, and pain.   ACTIVITY  LIMITATIONS: standing, squatting, stairs, transfers, and locomotion level  PARTICIPATION LIMITATIONS: cleaning, shopping, community activity, and yard work  PERSONAL FACTORS: Past/current experiences and Time since onset of injury/illness/exacerbation are also affecting patient's functional outcome.   REHAB POTENTIAL: Good  CLINICAL DECISION MAKING: Evolving/moderate complexity  EVALUATION COMPLEXITY: Moderate   GOALS: Goals reviewed with patient? Yes  SHORT TERM GOALS: Target date: 12/25/2023   Pt to be independent with initial HEP  Goal status: MET  2.  Pt to demo ability to perform s/l hip abd against gravity x 5   Goal status: MET    LONG TERM GOALS: Target date: 01/29/24  Pt to be independent with final HEP  Goal status: INITIAL  2.  Pt to demo strength of bil hips to be at max ability, to improve gait mechanics.   Goal status: INITIAL  3.  Pt to demo gait mechanics to be at max ability, with decreased trendelenburg.   Goal status: INITIAL  4.  Outcome measure goal tbd   Goal status: INITIAL   PLAN:  PT FREQUENCY: 1-2x/week  PT DURATION: 8 weeks  PLANNED INTERVENTIONS: Therapeutic exercises, Therapeutic activity, Neuromuscular re-education, Patient/Family education, Self Care, Joint mobilization, Joint manipulation, Stair training, Orthotic/Fit training, DME instructions, Aquatic Therapy, Dry Needling, Electrical stimulation, Cryotherapy, Moist heat, Taping, Ultrasound, Ionotophoresis 4mg /ml Dexamethasone , Manual therapy,  Vasopneumatic device, Traction, Spinal manipulation, Spinal mobilization,Balance training, Gait training,   PLAN FOR NEXT SESSION:  add band for s/l clams, slow walking, step throughs,    Tinnie Don, PT, DPT 2:36 PM  01/04/2024

## 2024-01-08 ENCOUNTER — Ambulatory Visit: Admitting: Physical Therapy

## 2024-01-08 ENCOUNTER — Encounter: Payer: Self-pay | Admitting: Physical Therapy

## 2024-01-08 DIAGNOSIS — M6281 Muscle weakness (generalized): Secondary | ICD-10-CM

## 2024-01-08 DIAGNOSIS — R2689 Other abnormalities of gait and mobility: Secondary | ICD-10-CM | POA: Diagnosis not present

## 2024-01-08 NOTE — Therapy (Signed)
 OUTPATIENT PHYSICAL THERAPY LOWER EXTREMITY TREATMENT   Patient Name: Kristin [name] MRN: [id number] DOB:May 01, 1950, 73 y.o., female Today's Date: 01/08/2024   END OF SESSION:  PT End of Session - 01/08/24 1211     Visit Number 8    Number of Visits 16    Date for Recertification  01/29/24    Authorization Type [employer]    PT Start Time 1215    PT Stop Time 1255    PT Time Calculation (min) 40 min    Activity Tolerance Patient tolerated treatment well    Behavior During Therapy WFL for tasks assessed/performed             Past Medical History:  Diagnosis Date   Anemia    AR (allergic rhinitis)    Atrial fibrillation (HCC) 2018   Atrial flutter (HCC)    Clotting disorder    pt denies   Diverticulosis    Esophageal ulcer    due to Motrin   GERD (gastroesophageal reflux disease)    Hiatal hernia    HLD (hyperlipidemia)    Hypothyroidism    IBS (irritable bowel syndrome)    Migraines    Mild sleep apnea    Osteoarthritis    Osteopenia after menopause 06/14/2017   DEXA 08/2016 [facility]: T = -1.10 radius, -1.0 femur; lumbar spine elevated due to DJD; recheck 2-3 years.   Pacemaker    CHB   Paroxysmal A-fib (HCC)    PUD (peptic ulcer disease)    Sleep apnea    Thyroid  nodule    Transient complete heart block 1996   Past Surgical History:  Procedure Laterality Date   ATRIAL FIBRILLATION ABLATION N/A 01/08/2019   Procedure: ATRIAL FIBRILLATION ABLATION;  Surgeon: Kristin Agent, MD;  Location: [facility];  Service: Cardiovascular;  Laterality: N/A;   ATRIAL FIBRILLATION ABLATION N/A 04/02/2019   Procedure: ATRIAL FIBRILLATION ABLATION;  Surgeon: Kristin Agent, MD;  Location: [facility];  Service: Cardiovascular;  Laterality: N/A;   CARDIOVERSION N/A 01/28/2019   Procedure: CARDIOVERSION;  Surgeon: Kristin Lonni CROME, MD;  Location: [facility] ENDOSCOPY;  Service: Endoscopy;  Laterality: N/A;   CARDIOVERSION N/A 02/27/2019   Procedure: CARDIOVERSION;   Surgeon: Kristin Lonni CROME, MD;  Location: [facility] ENDOSCOPY;  Service: Endoscopy;  Laterality: N/A;   COLONOSCOPY  2017   [place]   HIP ARTHROPLASTY Right 2008   PACEMAKER GENERATOR CHANGE  03/01/2007   performed by Dr Kristin [name] at [facility] ([employer])   PACEMAKER INSERTION  09/08/1994   performed in [place] for transient complete heart block   PPM GENERATOR CHANGEOUT N/A 06/04/2020   Procedure: PPM GENERATOR CHANGEOUT;  Surgeon: Kristin Agent, MD;  Location: [facility];  Service: Cardiovascular;  Laterality: N/A;   THYROIDECTOMY, PARTIAL  1986   BENIGN NODULES   TOTAL HIP ARTHROPLASTY Left 02/13/2018   Procedure: TOTAL HIP ARTHROPLASTY ANTERIOR APPROACH;  Surgeon: Kristin Cough, MD;  Location: [facility];  Service: Orthopedics;  Laterality: Left;  70 mins   TOTAL HIP REVISION Right 02/10/2022   Procedure: TOTAL HIP REVISION;  Surgeon: Kristin Cough, MD;  Location: [facility];  Service: Orthopedics;  Laterality: Right;   TUBAL LIGATION  1987   Patient Active Problem List   Diagnosis Date Noted   Presence of heart assist device (HCC) 11/22/2023   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/10/2022   Fibroid 01/11/2022   Left atrial enlargement 11/16/2020   Atrial flutter (HCC)    S/P left THA, AA 02/13/2018  OSA (obstructive sleep apnea) 10/20/2017    Class: Chronic   Osteopenia after menopause 06/14/2017   Osteoarthritis of left hip 05/11/2017   Diverticulosis of large intestine without hemorrhage 01/04/2016   Internal hemorrhoids without complication 01/04/2016   History of complete heart block 10/06/2015   Long term current use of anticoagulant 08/05/2015   Pacemaker 10/14/2014   Paroxysmal atrial fibrillation (HCC) 10/14/2014   AR (allergic rhinitis) 01/08/2013   GERD (gastroesophageal reflux disease) 01/08/2013   Hypothyroidism, postsurgical 01/08/2013   OA (osteoarthritis) 01/08/2013   History of peptic ulcer disease - nsaid 01/08/2013    PCP: Kristin [name]  REFERRING PROVIDER: Lavern [name]   REFERRING DIAG: Gait   THERAPY DIAG:  Other abnormalities of gait and mobility  Muscle weakness (generalized)  Rationale for Evaluation and Treatment: Rehabilitation  ONSET DATE:    SUBJECTIVE:   SUBJECTIVE STATEMENT:  Pt with no new complaints. Has been doing HEP.   Eval: Pt states history of previous hip replacement R side, about 15 years ago. Was limping prior to surgery . Limp has not gone away .  L hip also done, and R re-done- due to faulty prostheis- recall. 02/10/2022.  Both knees are now hurting R>L.  Feels knees are weaker, difficulty going up stairs, R gives out- needs railing.  Did not have actual in person PT following last L and R hip replacements, did have some home health after the 1st.  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  She is concerned with the continued limping and would like to get stronger.    PERTINENT HISTORY: Pacemaker, A-fib , thyroid -ectomy   PAIN:  Are you having pain? Yes: NPRS scale: 3/10 Pain location: knees R >L  Pain description: sore, achey Aggravating factors: increased activity , stairs  Relieving factors: none stated    PRECAUTIONS: ICD/Pacemaker  WEIGHT BEARING RESTRICTIONS: No  FALLS:  Has patient fallen in last 6 months? Yes. Number of falls 1 going up front stairs, tripped going up stair.    PLOF: Independent  PATIENT GOALS:  improved strength in knees and hips, walking/limp, stairs.   NEXT MD VISIT:   OBJECTIVE:   DIAGNOSTIC FINDINGS:   PATIENT SURVEYS:  Will do next visit   COGNITION: Overall cognitive status: Within functional limits for tasks assessed     SENSATION: WFL  EDEMA: N/A   POSTURE:  Supine: R LE longer than L;    Standing: R hip slightly higher  PALPATION: No pain to palpate  LOWER EXTREMITY ROM: Hip ROM: WFL bil Lumbar: WFL   LOWER EXTREMITY MMT:  MMT Left eval Right  eval  Hip flexion 4- 4/4+  Hip extension    Hip abduction  3+ 3+  Hip adduction    Hip internal rotation    Hip external rotation    Knee flexion 4+ 4+  Knee extension 4 4  Ankle dorsiflexion    Ankle plantarflexion    Ankle inversion    Ankle eversion     (Blank rows = not tested)  LOWER EXTREMITY SPECIAL TESTS:    FUNCTIONAL TESTS:    GAIT: Distance walked: 100 ft Assistive device utilized: None Level of assistance: Complete Independence Comments: trendelenburg   TODAY'S TREATMENT:  DATE:   01/08/2024 Therapeutic Exercise: Aerobic: Bike L2 x 7  min  Supine:   SLR x 10 bil;   modified crunch x 15;  90/90 holds 10 sec x 3: high bicycle x 10;  S/L:   Hip abd 2  x 8 bil ;  clams RTB x 15 bil;  Seated:   Standing: Hip abd and ext  2 x 10 bil ;  Squats 3 x 5 (8lb )   Stretches: Neuromuscular Re-education Manual Therapy: Therapeutic Activity: Squats x15  (8lb )  cueing for back posture, wide feet.  Pilates reformer: leg press double leg x 15, SL x 10 bil; education for using leg press at home  Self Care:    Therapeutic Exercise: Aerobic: Bike L2 x 7  min  Supine: LTR x 10;    SLR x 10 bil;   small circles x 10 cw, ccw, bil;  S/L:   Hip abd 2  x 8 bil  Seated:   Standing: Hip abd 2 x 10 bil   ; Squats 3 x 5 (7lb )   Stretches: Neuromuscular Re-education: Weight shifts-staggered stance x 15 bil;  Step throughs 2 x 10 bil;  Slow walking in hallway- for control of hips and core x 8  Side stepping at counter x 6 Manual Therapy: Therapeutic Activity: Squats 3 x 5 (7lb )    Self Care:     Therapeutic Exercise: Aerobic: Bike L2 x 7  min  Supine: LTR x 10;    SLR x 10 bil;  small circles x 10 bil;  S/L:    Hip abd 2  x 6 on R ,  2 x 8 on L;  Seated:   Standing: Hip abd 2 x 10 bil    Stretches: Neuromuscular Re-education: Weight shifts-staggered stance x 15 bil;  Step throughs 2 x 10 bil;   Slow walking in hallway- for control of hips and core x 8  Side stepping at counter x 6 Manual Therapy: Therapeutic Activity: Squats x 10;  Step ups 6 in x 10 bil; 1 Ue light support  Self Care:    Therapeutic Exercise: Aerobic: Supine: LTR x 10;   bridging 2 x 10 ;   SLR 2 x 10 on R,  2 x 10 on L (slight pain in L anterior hip on left by rep 8 )  S/L:   Hip abd  x 10 bil (able to do unassisted)  Seated:  Sit to stand x 10 (mat table)   Standing: Hip abd 2 x 10 bil - cueing for posture;   side stepping - cueing for mechanics and decreasing trunk sway x 10  Stretches: seated Hamstring stretch x 3 bil; piriformis x 3 bil;  Neuromuscular Re-education: Manual Therapy: Therapeutic Activity: Self Care:    Therapeutic Exercise: Aerobic: Supine: LTR x 10;   bridging 2 x 10 ; SLR 2 x 10 on R, 2 x 5 on L (slight pain in anterior hip)  S/L:  clams x 10 bil;  Hip abd (assisted) x 10 bil;  Seated:  Sit to stand x 10 (higher mat table)  regular chair x 10 (better back mechanics for higher surface)  Standing: Hip abd 2 x 10 bil - cueing for posture;  Fwd weight shifts onto R LE- staggered stance x 15;  Stretches:  Neuromuscular Re-education: Manual Therapy: Therapeutic Activity: Self Care:   PATIENT EDUCATION:  Education details: updated and reviewed HEP Person educated: Patient Education method: Explanation, Demonstration, Tactile  cues, Verbal cues, and Handouts Education comprehension: verbalized understanding, returned demonstration, verbal cues required, tactile cues required, and needs further education   HOME EXERCISE PROGRAM: Access Code: [email or web address]   ASSESSMENT:  CLINICAL IMPRESSION: Reviewed leg press on reformer today, with education on doing leg press at home. Continued to progress and review strength. She is getting stronger overall, but hip abd on R still very weak, with difficulty lifting against gravity. Pt will continue to benefit from focus on this.   Eval:  Patient presents with primary complaint of  weakness and gait deficits. Pt with poor gait mechanics, with trendelenburg present due to weakness in abductors. She has had long term limp with lack of effective HEP to improve. She has decreased functional strength of LEs, and will benefit from education on this.  Pt with decreased ability for full functional activities. Pt will  benefit from skilled PT to improve deficits and pain and to return to PLOF.   OBJECTIVE IMPAIRMENTS: Abnormal gait, decreased activity tolerance, difficulty walking, decreased strength, improper body mechanics, and pain.   ACTIVITY LIMITATIONS: standing, squatting, stairs, transfers, and locomotion level  PARTICIPATION LIMITATIONS: cleaning, shopping, community activity, and yard work  PERSONAL FACTORS: Past/current experiences and Time since onset of injury/illness/exacerbation are also affecting patient's functional outcome.   REHAB POTENTIAL: Good  CLINICAL DECISION MAKING: Evolving/moderate complexity  EVALUATION COMPLEXITY: Moderate   GOALS: Goals reviewed with patient? Yes  SHORT TERM GOALS: Target date: 12/25/2023   Pt to be independent with initial HEP  Goal status: MET  2.  Pt to demo ability to perform s/l hip abd against gravity x 5   Goal status: MET    LONG TERM GOALS: Target date: 01/29/24  Pt to be independent with final HEP  Goal status: INITIAL  2.  Pt to demo strength of bil hips to be at max ability, to improve gait mechanics.   Goal status: INITIAL  3.  Pt to demo gait mechanics to be at max ability, with decreased trendelenburg.   Goal status: INITIAL  4.  Outcome measure goal tbd   Goal status: INITIAL   PLAN:  PT FREQUENCY: 1-2x/week  PT DURATION: 8 weeks  PLANNED INTERVENTIONS: Therapeutic exercises, Therapeutic activity, Neuromuscular re-education, Patient/Family education, Self Care, Joint mobilization, Joint manipulation, Stair training, Orthotic/Fit training, DME  instructions, Aquatic Therapy, Dry Needling, Electrical stimulation, Cryotherapy, Moist heat, Taping, Ultrasound, Ionotophoresis 4mg /ml Dexamethasone , Manual therapy,  Vasopneumatic device, Traction, Spinal manipulation, Spinal mobilization,Balance training, Gait training,   PLAN FOR NEXT SESSION:  add band for s/l clams, slow walking, step throughs,    Tinnie Don, PT, DPT 2:18 PM  01/08/2024

## 2024-01-11 ENCOUNTER — Encounter: Admitting: Physical Therapy

## 2024-01-15 ENCOUNTER — Ambulatory Visit: Admitting: Physical Therapy

## 2024-01-15 ENCOUNTER — Encounter: Payer: Self-pay | Admitting: Physical Therapy

## 2024-01-15 DIAGNOSIS — R2689 Other abnormalities of gait and mobility: Secondary | ICD-10-CM | POA: Diagnosis not present

## 2024-01-15 DIAGNOSIS — M6281 Muscle weakness (generalized): Secondary | ICD-10-CM

## 2024-01-15 NOTE — Therapy (Signed)
 " OUTPATIENT PHYSICAL THERAPY LOWER EXTREMITY TREATMENT   Patient Name: SOFIYA EZELLE MRN: [id number] DOB:1950/05/12, 73 y.o., female Today's Date: 01/15/2024   END OF SESSION:  PT End of Session - 01/15/24 1232     Visit Number 9    Number of Visits 16    Date for Recertification  01/29/24    Authorization Type [employer]    PT Start Time 1217    PT Stop Time 1300    PT Time Calculation (min) 43 min    Activity Tolerance Patient tolerated treatment well    Behavior During Therapy WFL for tasks assessed/performed              Past Medical History:  Diagnosis Date   Anemia    AR (allergic rhinitis)    Atrial fibrillation (HCC) 2018   Atrial flutter (HCC)    Clotting disorder    pt denies   Diverticulosis    Esophageal ulcer    due to Motrin   GERD (gastroesophageal reflux disease)    Hiatal hernia    HLD (hyperlipidemia)    Hypothyroidism    IBS (irritable bowel syndrome)    Migraines    Mild sleep apnea    Osteoarthritis    Osteopenia after menopause 06/14/2017   DEXA 08/2016 [facility]: T = -1.10 radius, -1.0 femur; lumbar spine elevated due to DJD; recheck 2-3 years.   Pacemaker    CHB   Paroxysmal A-fib (HCC)    PUD (peptic ulcer disease)    Sleep apnea    Thyroid  nodule    Transient complete heart block 1996   Past Surgical History:  Procedure Laterality Date   ATRIAL FIBRILLATION ABLATION N/A 01/08/2019   Procedure: ATRIAL FIBRILLATION ABLATION;  Surgeon: Kelsie Agent, MD;  Location: [facility];  Service: Cardiovascular;  Laterality: N/A;   ATRIAL FIBRILLATION ABLATION N/A 04/02/2019   Procedure: ATRIAL FIBRILLATION ABLATION;  Surgeon: Kelsie Agent, MD;  Location: [facility];  Service: Cardiovascular;  Laterality: N/A;   CARDIOVERSION N/A 01/28/2019   Procedure: CARDIOVERSION;  Surgeon: Kate Lonni CROME, MD;  Location: [facility] ENDOSCOPY;  Service: Endoscopy;  Laterality: N/A;   CARDIOVERSION N/A 02/27/2019   Procedure: CARDIOVERSION;   Surgeon: Kate Lonni CROME, MD;  Location: [facility] ENDOSCOPY;  Service: Endoscopy;  Laterality: N/A;   COLONOSCOPY  2017   [place]   HIP ARTHROPLASTY Right 2008   PACEMAKER GENERATOR CHANGE  03/01/2007   performed by Dr Marcine at [facility] ([employer])   PACEMAKER INSERTION  09/08/1994   performed in [place] for transient complete heart block   PPM GENERATOR CHANGEOUT N/A 06/04/2020   Procedure: PPM GENERATOR CHANGEOUT;  Surgeon: Kelsie Agent, MD;  Location: [facility];  Service: Cardiovascular;  Laterality: N/A;   THYROIDECTOMY, PARTIAL  1986   BENIGN NODULES   TOTAL HIP ARTHROPLASTY Left 02/13/2018   Procedure: TOTAL HIP ARTHROPLASTY ANTERIOR APPROACH;  Surgeon: Ernie Cough, MD;  Location: [facility];  Service: Orthopedics;  Laterality: Left;  70 mins   TOTAL HIP REVISION Right 02/10/2022   Procedure: TOTAL HIP REVISION;  Surgeon: Ernie Cough, MD;  Location: [facility];  Service: Orthopedics;  Laterality: Right;   TUBAL LIGATION  1987   Patient Active Problem List   Diagnosis Date Noted   Presence of heart assist device (HCC) 11/22/2023   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/10/2022   Fibroid 01/11/2022   Left atrial enlargement 11/16/2020   Atrial flutter (HCC)    S/P left THA, AA  02/13/2018   OSA (obstructive sleep apnea) 10/20/2017    Class: Chronic   Osteopenia after menopause 06/14/2017   Osteoarthritis of left hip 05/11/2017   Diverticulosis of large intestine without hemorrhage 01/04/2016   Internal hemorrhoids without complication 01/04/2016   History of complete heart block 10/06/2015   Long term current use of anticoagulant 08/05/2015   Pacemaker 10/14/2014   Paroxysmal atrial fibrillation (HCC) 10/14/2014   AR (allergic rhinitis) 01/08/2013   GERD (gastroesophageal reflux disease) 01/08/2013   Hypothyroidism, postsurgical 01/08/2013   OA (osteoarthritis) 01/08/2013   History of peptic ulcer disease - nsaid 01/08/2013    PCP: Lavern Heck  REFERRING PROVIDER: Lavern Heck   REFERRING DIAG: Gait   THERAPY DIAG:  Other abnormalities of gait and mobility  Muscle weakness (generalized)  Rationale for Evaluation and Treatment: Rehabilitation  ONSET DATE:    SUBJECTIVE:   SUBJECTIVE STATEMENT:  Pt with no new complaints. Has been doing HEP.   Eval: Pt states history of previous hip replacement R side, about 15 years ago. Was limping prior to surgery . Limp has not gone away .  L hip also done, and R re-done- due to faulty prostheis- recall. 02/10/2022.  Both knees are now hurting R>L.  Feels knees are weaker, difficulty going up stairs, R gives out- needs railing.  Did not have actual in person PT following last L and R hip replacements, did have some home health after the 1st.  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  She is concerned with the continued limping and would like to get stronger.    PERTINENT HISTORY: Pacemaker, A-fib , thyroid -ectomy   PAIN:  Are you having pain? Yes: NPRS scale: 3/10 Pain location: knees R >L  Pain description: sore, achey Aggravating factors: increased activity , stairs  Relieving factors: none stated    PRECAUTIONS: ICD/Pacemaker  WEIGHT BEARING RESTRICTIONS: No  FALLS:  Has patient fallen in last 6 months? Yes. Number of falls 1 going up front stairs, tripped going up stair.    PLOF: Independent  PATIENT GOALS:  improved strength in knees and hips, walking/limp, stairs.   NEXT MD VISIT:   OBJECTIVE:   DIAGNOSTIC FINDINGS:   PATIENT SURVEYS:  Will do next visit   COGNITION: Overall cognitive status: Within functional limits for tasks assessed     SENSATION: WFL  EDEMA: N/A   POSTURE:  Supine: R LE longer than L;    Standing: R hip slightly higher  PALPATION: No pain to palpate  LOWER EXTREMITY ROM: Hip ROM: WFL bil Lumbar: WFL   LOWER EXTREMITY MMT:  MMT Left eval Right  eval Left / Right 01/15/24  Hip flexion 4- 4/4+ 4 /  4+    Hip extension     Hip abduction 3+ 3+ 3+/  3+  Hip adduction     Hip internal rotation     Hip external rotation     Knee flexion 4+ 4+   Knee extension 4 4   Ankle dorsiflexion     Ankle plantarflexion     Ankle inversion     Ankle eversion      (Blank rows = not tested)  LOWER EXTREMITY SPECIAL TESTS:    FUNCTIONAL TESTS:    GAIT: Distance walked: 100 ft Assistive device utilized: None Level of assistance: Complete Independence Comments: trendelenburg   TODAY'S TREATMENT:  DATE:   01/15/2024  Therapeutic Exercise: Aerobic: Bike L2 x 7  min  Supine:   SLR 2 x 10 bil;   90/90 holds 15 sec x 3:  high bicycle 3 x 5; bridging with blue TB x 15;  S/L:   Hip abd   x 8 bil;   Seated:   Standing: Hip abd and ext  2 x 10 bil ;  Squats x20 Stretches: Neuromuscular Re-education Manual Therapy: Therapeutic Activity: Squats x15 ;   Ambulation with cueing for mechanics and posture, slow 45 ft x 8  Fwd and lat step ups on 4 in step x 10 ea bil;  Self Care:    Therapeutic Exercise: Aerobic: Bike L2 x 7  min  Supine: LTR x 10;    SLR x 10 bil;   small circles x 10 cw, ccw, bil;  S/L:   Hip abd 2  x 8 bil  Seated:   Standing: Hip abd 2 x 10 bil   ; Squats 3 x 5 (7lb )   Stretches: Neuromuscular Re-education: Weight shifts-staggered stance x 15 bil;  Step throughs 2 x 10 bil;  Slow walking in hallway- for control of hips and core x 8  Side stepping at counter x 6 Manual Therapy: Therapeutic Activity: Squats 3 x 5 (7lb )    Self Care:     Therapeutic Exercise: Aerobic: Bike L2 x 7  min  Supine: LTR x 10;    SLR x 10 bil;  small circles x 10 bil;  S/L:    Hip abd 2  x 6 on R ,  2 x 8 on L;  Seated:   Standing: Hip abd 2 x 10 bil    Stretches: Neuromuscular Re-education: Weight shifts-staggered stance x 15 bil;  Step throughs 2 x 10  bil;  Slow walking in hallway- for control of hips and core x 8  Side stepping at counter x 6 Manual Therapy: Therapeutic Activity: Squats x 10;  Step ups 6 in x 10 bil; 1 Ue light support  Self Care:    Therapeutic Exercise: Aerobic: Supine: LTR x 10;   bridging 2 x 10 ;   SLR 2 x 10 on R,  2 x 10 on L (slight pain in L anterior hip on left by rep 8 )  S/L:   Hip abd  x 10 bil (able to do unassisted)  Seated:  Sit to stand x 10 (mat table)   Standing: Hip abd 2 x 10 bil - cueing for posture;   side stepping - cueing for mechanics and decreasing trunk sway x 10  Stretches: seated Hamstring stretch x 3 bil; piriformis x 3 bil;  Neuromuscular Re-education: Manual Therapy: Therapeutic Activity: Self Care:    Therapeutic Exercise: Aerobic: Supine: LTR x 10;   bridging 2 x 10 ; SLR 2 x 10 on R, 2 x 5 on L (slight pain in anterior hip)  S/L:  clams x 10 bil;  Hip abd (assisted) x 10 bil;  Seated:  Sit to stand x 10 (higher mat table)  regular chair x 10 (better back mechanics for higher surface)  Standing: Hip abd 2 x 10 bil - cueing for posture;  Fwd weight shifts onto R LE- staggered stance x 15;  Stretches:  Neuromuscular Re-education: Manual Therapy: Therapeutic Activity: Self Care:   PATIENT EDUCATION:  Education details: updated and reviewed HEP Person educated: Patient Education method: Explanation, Demonstration, Tactile cues, Verbal cues, and Handouts Education comprehension: verbalized  understanding, returned demonstration, verbal cues required, tactile cues required, and needs further education   HOME EXERCISE PROGRAM: Access Code: [email or web address]   ASSESSMENT:  CLINICAL IMPRESSION: Pt with noted improvements in L hip flexion strength with testing today. She continues to have weakness for abduction that limits gait mechanics. Still requiring slight assist from hand on stairs for mechanics and safety.  Pt to benefit from continued care.   Eval: Patient presents  with primary complaint of  weakness and gait deficits. Pt with poor gait mechanics, with trendelenburg present due to weakness in abductors. She has had long term limp with lack of effective HEP to improve. She has decreased functional strength of LEs, and will benefit from education on this.  Pt with decreased ability for full functional activities. Pt will  benefit from skilled PT to improve deficits and pain and to return to PLOF.   OBJECTIVE IMPAIRMENTS: Abnormal gait, decreased activity tolerance, difficulty walking, decreased strength, improper body mechanics, and pain.   ACTIVITY LIMITATIONS: standing, squatting, stairs, transfers, and locomotion level  PARTICIPATION LIMITATIONS: cleaning, shopping, community activity, and yard work  PERSONAL FACTORS: Past/current experiences and Time since onset of injury/illness/exacerbation are also affecting patient's functional outcome.   REHAB POTENTIAL: Good  CLINICAL DECISION MAKING: Evolving/moderate complexity  EVALUATION COMPLEXITY: Moderate   GOALS: Goals reviewed with patient? Yes  SHORT TERM GOALS: Target date: 12/25/2023   Pt to be independent with initial HEP  Goal status: MET  2.  Pt to demo ability to perform s/l hip abd against gravity x 5   Goal status: MET    LONG TERM GOALS: Target date: 01/29/24  Pt to be independent with final HEP  Goal status: INITIAL  2.  Pt to demo strength of bil hips to be at max ability, to improve gait mechanics.   Goal status: INITIAL  3.  Pt to demo gait mechanics to be at max ability, with decreased trendelenburg.   Goal status: INITIAL  4.  Outcome measure goal tbd   Goal status: INITIAL   PLAN:  PT FREQUENCY: 1-2x/week  PT DURATION: 8 weeks  PLANNED INTERVENTIONS: Therapeutic exercises, Therapeutic activity, Neuromuscular re-education, Patient/Family education, Self Care, Joint mobilization, Joint manipulation, Stair training, Orthotic/Fit training, DME instructions,  Aquatic Therapy, Dry Needling, Electrical stimulation, Cryotherapy, Moist heat, Taping, Ultrasound, Ionotophoresis 4mg /ml Dexamethasone , Manual therapy,  Vasopneumatic device, Traction, Spinal manipulation, Spinal mobilization,Balance training, Gait training,   PLAN FOR NEXT SESSION:  add band for s/l clams, slow walking, step throughs,    Tinnie Don, PT, DPT 12:33 PM  01/15/2024   "

## 2024-01-22 ENCOUNTER — Ambulatory Visit: Admitting: Physical Therapy

## 2024-01-22 ENCOUNTER — Encounter: Payer: Self-pay | Admitting: Physical Therapy

## 2024-01-22 DIAGNOSIS — M6281 Muscle weakness (generalized): Secondary | ICD-10-CM | POA: Diagnosis not present

## 2024-01-22 DIAGNOSIS — R2689 Other abnormalities of gait and mobility: Secondary | ICD-10-CM

## 2024-01-22 NOTE — Therapy (Signed)
 " OUTPATIENT PHYSICAL THERAPY LOWER EXTREMITY TREATMENT/PN   Patient Name: Kristin [name] MRN: [id number] DOB:[birth year], 73 y.o., female Today's Date: 01/22/2024  Physical Therapy Progress Note  Dates of Reporting Period:  12/04/23   to    01/22/24     END OF SESSION:  PT End of Session - 01/22/24 1521     Visit Number 10    Number of Visits 16    Date for Recertification  01/29/24    Authorization Type [employer], PN done at visit 10.    PT Start Time 1518    PT Stop Time 1600    PT Time Calculation (min) 42 min    Activity Tolerance Patient tolerated treatment well    Behavior During Therapy WFL for tasks assessed/performed              Past Medical History:  Diagnosis Date   Anemia    AR (allergic rhinitis)    Atrial fibrillation (HCC) 2018   Atrial flutter (HCC)    Clotting disorder    pt denies   Diverticulosis    Esophageal ulcer    due to Motrin   GERD (gastroesophageal reflux disease)    Hiatal hernia    HLD (hyperlipidemia)    Hypothyroidism    IBS (irritable bowel syndrome)    Migraines    Mild sleep apnea    Osteoarthritis    Osteopenia after menopause 06/14/2017   DEXA 08/2016 [facility]: T = -1.10 radius, -1.0 femur; lumbar spine elevated due to DJD; recheck 2-3 years.   Pacemaker    CHB   Paroxysmal A-fib (HCC)    PUD (peptic ulcer disease)    Sleep apnea    Thyroid  nodule    Transient complete heart block 1996   Past Surgical History:  Procedure Laterality Date   ATRIAL FIBRILLATION ABLATION N/A 01/08/2019   Procedure: ATRIAL FIBRILLATION ABLATION;  Surgeon: Kelsie Agent, MD;  Location: [facility];  Service: Cardiovascular;  Laterality: N/A;   ATRIAL FIBRILLATION ABLATION N/A 04/02/2019   Procedure: ATRIAL FIBRILLATION ABLATION;  Surgeon: Kelsie Agent, MD;  Location: [facility];  Service: Cardiovascular;  Laterality: N/A;   CARDIOVERSION N/A 01/28/2019   Procedure: CARDIOVERSION;  Surgeon: Kate Lonni CROME, MD;   Location: [facility] ENDOSCOPY;  Service: Endoscopy;  Laterality: N/A;   CARDIOVERSION N/A 02/27/2019   Procedure: CARDIOVERSION;  Surgeon: Kate Lonni CROME, MD;  Location: [facility] ENDOSCOPY;  Service: Endoscopy;  Laterality: N/A;   COLONOSCOPY  2017   [place]   HIP ARTHROPLASTY Right 2008   PACEMAKER GENERATOR CHANGE  03/01/2007   performed by Dr Marcine at [facility] ([employer])   PACEMAKER INSERTION  09/08/1994   performed in [place] for transient complete heart block   PPM GENERATOR CHANGEOUT N/A 06/04/2020   Procedure: PPM GENERATOR CHANGEOUT;  Surgeon: Kelsie Agent, MD;  Location: [facility];  Service: Cardiovascular;  Laterality: N/A;   THYROIDECTOMY, PARTIAL  1986   BENIGN NODULES   TOTAL HIP ARTHROPLASTY Left 02/13/2018   Procedure: TOTAL HIP ARTHROPLASTY ANTERIOR APPROACH;  Surgeon: Ernie Cough, MD;  Location: [facility];  Service: Orthopedics;  Laterality: Left;  70 mins   TOTAL HIP REVISION Right 02/10/2022   Procedure: TOTAL HIP REVISION;  Surgeon: Ernie Cough, MD;  Location: [facility];  Service: Orthopedics;  Laterality: Right;   TUBAL LIGATION  1987   Patient Active Problem List   Diagnosis Date Noted   Presence of heart assist device (HCC) 11/22/2023   S/P revision  of total hip 02/10/2022   Fibroid 01/11/2022   Left atrial enlargement 11/16/2020   Atrial flutter (HCC)    S/P left THA, AA 02/13/2018   OSA (obstructive sleep apnea) 10/20/2017    Class: Chronic   Osteopenia after menopause 06/14/2017   Osteoarthritis of left hip 05/11/2017   Diverticulosis of large intestine without hemorrhage 01/04/2016   Internal hemorrhoids without complication 01/04/2016   History of complete heart block 10/06/2015   Long term current use of anticoagulant 08/05/2015   Pacemaker 10/14/2014   Paroxysmal atrial fibrillation (HCC) 10/14/2014   AR (allergic rhinitis) 01/08/2013   GERD (gastroesophageal reflux disease) 01/08/2013   Hypothyroidism, postsurgical  01/08/2013   OA (osteoarthritis) 01/08/2013   History of peptic ulcer disease - nsaid 01/08/2013    PCP: Lavern Heck  REFERRING PROVIDER: Lavern Heck   REFERRING DIAG: Gait   THERAPY DIAG:  Other abnormalities of gait and mobility  Muscle weakness (generalized)  Rationale for Evaluation and Treatment: Rehabilitation  ONSET DATE:    SUBJECTIVE:   SUBJECTIVE STATEMENT:  Pt with no new complaints. Has been doing HEP.    Eval: Pt states history of previous hip replacement R side, about 15 years ago. Was limping prior to surgery . Limp has not gone away .  L hip also done, and R re-done- due to faulty prostheis- recall. 02/10/2022.  Both knees are now hurting R>L.  Feels knees are weaker, difficulty going up stairs, R gives out- needs railing.  Did not have actual in person PT following last L and R hip replacements, did have some home health after the 1st.  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  She is concerned with the continued limping and would like to get stronger.    PERTINENT HISTORY: Pacemaker, A-fib , thyroid -ectomy   PAIN:  Are you having pain? Yes: NPRS scale: 3/10 Pain location: knees R >L  Pain description: sore, achey Aggravating factors: increased activity , stairs  Relieving factors: none stated    PRECAUTIONS: ICD/Pacemaker  WEIGHT BEARING RESTRICTIONS: No  FALLS:  Has patient fallen in last 6 months? Yes. Number of falls 1 going up front stairs, tripped going up stair.    PLOF: Independent  PATIENT GOALS:  improved strength in knees and hips, walking/limp, stairs.   NEXT MD VISIT:   OBJECTIVE:   DIAGNOSTIC FINDINGS:   PATIENT SURVEYS:  Will do next visit   COGNITION: Overall cognitive status: Within functional limits for tasks assessed     SENSATION: WFL  EDEMA: N/A   POSTURE:  Supine: R LE longer than L;    Standing: R hip slightly higher  PALPATION: No pain to palpate  LOWER EXTREMITY ROM: Hip ROM: WFL  bil Lumbar: WFL    LOWER EXTREMITY MMT:  MMT Left eval Right  eval Left / Right 01/22/24  Hip flexion 4- 4/4+ 4 /  4+   Hip extension     Hip abduction 3+ 3+ 3+/  3+  Hip adduction     Hip internal rotation     Hip external rotation     Knee flexion 4+ 4+   Knee extension 4 4   Ankle dorsiflexion     Ankle plantarflexion     Ankle inversion     Ankle eversion      (Blank rows = not tested)  LOWER EXTREMITY SPECIAL TESTS:    FUNCTIONAL TESTS:    GAIT: Distance walked: 100 ft Assistive device utilized: None Level of assistance: Complete Independence  Comments: trendelenburg   TODAY'S TREATMENT:                                                                                                                              DATE:   01/22/2024  Therapeutic Exercise: Aerobic: Bike L2 x 7  min  Supine:    S/L:    Seated:   Standing: Hip abd and ext  2 x 10 bil ;  slow march x 20;  Stretches: Neuromuscular Re-education Gait: using heel lift in L shoe, education on use, wear time and precautions. Pt with good comfort today, but still with decreased gait mechanics and trendelenburg. Will continue to wear at home.  Ambulation with cueing for mechanics and posture, slow 45 ft x 8  Walking in hallway: slow/fwd,  bwd,   Manual Therapy: Therapeutic Activity: Squats x15  Trx  x 20;  Fwd step ups 6 in and lat step ups on 4 in step x 10 ea bil;  Self Care:    Therapeutic Exercise: Aerobic: Bike L2 x 7  min  Supine: LTR x 10;    SLR x 10 bil;   small circles x 10 cw, ccw, bil;  S/L:   Hip abd 2  x 8 bil  Seated:   Standing: Hip abd 2 x 10 bil   ; Squats 3 x 5 (7lb )   Stretches: Neuromuscular Re-education: Weight shifts-staggered stance x 15 bil;  Step throughs 2 x 10 bil;  Slow walking in hallway- for control of hips and core x 8  Side stepping at counter x 6 Manual Therapy: Therapeutic Activity: Squats 3 x 5 (7lb )    Self Care:     Therapeutic  Exercise: Aerobic: Bike L2 x 7  min  Supine: LTR x 10;    SLR x 10 bil;  small circles x 10 bil;  S/L:    Hip abd 2  x 6 on R ,  2 x 8 on L;  Seated:   Standing: Hip abd 2 x 10 bil    Stretches: Neuromuscular Re-education: Weight shifts-staggered stance x 15 bil;  Step throughs 2 x 10 bil;  Slow walking in hallway- for control of hips and core x 8  Side stepping at counter x 6 Manual Therapy: Therapeutic Activity: Squats x 10;  Step ups 6 in x 10 bil; 1 Ue light support  Self Care:    Therapeutic Exercise: Aerobic: Supine: LTR x 10;   bridging 2 x 10 ;   SLR 2 x 10 on R,  2 x 10 on L (slight pain in L anterior hip on left by rep 8 )  S/L:   Hip abd  x 10 bil (able to do unassisted)  Seated:  Sit to stand x 10 (mat table)   Standing: Hip abd 2 x 10 bil - cueing for posture;   side stepping - cueing for mechanics and decreasing trunk sway x 10  Stretches: seated Hamstring stretch x 3 bil; piriformis x 3 bil;  Neuromuscular Re-education: Manual Therapy: Therapeutic Activity: Self Care:       PATIENT EDUCATION:  Education details: updated and reviewed HEP Person educated: Patient Education method: Explanation, Demonstration, Tactile cues, Verbal cues, and Handouts Education comprehension: verbalized understanding, returned demonstration, verbal cues required, tactile cues required, and needs further education   HOME EXERCISE PROGRAM: Access Code: [email or web address]   ASSESSMENT:  CLINICAL IMPRESSION: PN: pt has been seen for 10 visits. With continued gait deficits, we tried heel lift for L shoe today, due to leg length deficit. She has good comfort with wear, but gait mechanics not significantly improved thus far. She will continue to wear at home, and we will continue to assess next week. Continued focus on hip strength and gait mechanics today. Pt to benefit from continued care.   Eval: Patient presents with primary complaint of  weakness and gait deficits. Pt with poor gait  mechanics, with trendelenburg present due to weakness in abductors. She has had long term limp with lack of effective HEP to improve. She has decreased functional strength of LEs, and will benefit from education on this.  Pt with decreased ability for full functional activities. Pt will  benefit from skilled PT to improve deficits and pain and to return to PLOF.   OBJECTIVE IMPAIRMENTS: Abnormal gait, decreased activity tolerance, difficulty walking, decreased strength, improper body mechanics, and pain.   ACTIVITY LIMITATIONS: standing, squatting, stairs, transfers, and locomotion level  PARTICIPATION LIMITATIONS: cleaning, shopping, community activity, and yard work  PERSONAL FACTORS: Past/current experiences and Time since onset of injury/illness/exacerbation are also affecting patient's functional outcome.   REHAB POTENTIAL: Good  CLINICAL DECISION MAKING: Evolving/moderate complexity  EVALUATION COMPLEXITY: Moderate   GOALS: Goals reviewed with patient? Yes  SHORT TERM GOALS: Target date: 12/25/2023   Pt to be independent with initial HEP  Goal status: MET  2.  Pt to demo ability to perform s/l hip abd against gravity x 5   Goal status: MET    LONG TERM GOALS: Target date: 01/29/24  Pt to be independent with final HEP  Goal status: In progress  2.  Pt to demo strength of bil hips to be at max ability, to improve gait mechanics.   Goal status: In progress   3.  Pt to demo gait mechanics to be at max ability, with decreased trendelenburg.   Goal status: In progress   4.  Outcome measure goal tbd   Goal status: INITIAL   PLAN:  PT FREQUENCY: 1-2x/week  PT DURATION: 8 weeks  PLANNED INTERVENTIONS: Therapeutic exercises, Therapeutic activity, Neuromuscular re-education, Patient/Family education, Self Care, Joint mobilization, Joint manipulation, Stair training, Orthotic/Fit training, DME instructions, Aquatic Therapy, Dry Needling, Electrical stimulation,  Cryotherapy, Moist heat, Taping, Ultrasound, Ionotophoresis 4mg /ml Dexamethasone , Manual therapy,  Vasopneumatic device, Traction, Spinal manipulation, Spinal mobilization,Balance training, Gait training,   PLAN FOR NEXT SESSION:  gait    Tinnie Don, PT, DPT 3:22 PM  01/22/2024   "

## 2024-01-29 ENCOUNTER — Encounter: Payer: Self-pay | Admitting: Physical Therapy

## 2024-01-29 ENCOUNTER — Encounter: Admitting: Physical Therapy

## 2024-01-29 DIAGNOSIS — R2689 Other abnormalities of gait and mobility: Secondary | ICD-10-CM | POA: Diagnosis not present

## 2024-01-29 DIAGNOSIS — M6281 Muscle weakness (generalized): Secondary | ICD-10-CM | POA: Diagnosis not present

## 2024-01-29 NOTE — Therapy (Signed)
 " OUTPATIENT PHYSICAL THERAPY LOWER EXTREMITY TREATMENT/Re-Cert    Patient Name: Kristin [name] MRN: [id number] DOB:1950-04-16, 74 y.o., female Today's Date: 01/29/2024     END OF SESSION:  PT End of Session - 01/29/24 1430     Visit Number 11    Number of Visits 20    Date for Recertification  03/25/24    Authorization Type [employer], Re-cert done at visit 11.    PT Start Time 1350    PT Stop Time 1430    PT Time Calculation (min) 40 min    Activity Tolerance Patient tolerated treatment well    Behavior During Therapy WFL for tasks assessed/performed               Past Medical History:  Diagnosis Date   Anemia    AR (allergic rhinitis)    Atrial fibrillation (HCC) 2018   Atrial flutter (HCC)    Clotting disorder    pt denies   Diverticulosis    Esophageal ulcer    due to Motrin   GERD (gastroesophageal reflux disease)    Hiatal hernia    HLD (hyperlipidemia)    Hypothyroidism    IBS (irritable bowel syndrome)    Migraines    Mild sleep apnea    Osteoarthritis    Osteopenia after menopause 06/14/2017   DEXA 08/2016 [facility]: T = -1.10 radius, -1.0 femur; lumbar spine elevated due to DJD; recheck 2-3 years.   Pacemaker    CHB   Paroxysmal A-fib (HCC)    PUD (peptic ulcer disease)    Sleep apnea    Thyroid  nodule    Transient complete heart block 1996   Past Surgical History:  Procedure Laterality Date   ATRIAL FIBRILLATION ABLATION N/A 01/08/2019   Procedure: ATRIAL FIBRILLATION ABLATION;  Surgeon: Kelsie Agent, MD;  Location: [facility];  Service: Cardiovascular;  Laterality: N/A;   ATRIAL FIBRILLATION ABLATION N/A 04/02/2019   Procedure: ATRIAL FIBRILLATION ABLATION;  Surgeon: Kelsie Agent, MD;  Location: [facility];  Service: Cardiovascular;  Laterality: N/A;   CARDIOVERSION N/A 01/28/2019   Procedure: CARDIOVERSION;  Surgeon: Kate Lonni CROME, MD;  Location: [facility] ENDOSCOPY;  Service: Endoscopy;  Laterality: N/A;   CARDIOVERSION N/A  02/27/2019   Procedure: CARDIOVERSION;  Surgeon: Kate Lonni CROME, MD;  Location: [facility] ENDOSCOPY;  Service: Endoscopy;  Laterality: N/A;   COLONOSCOPY  2017   [place]   HIP ARTHROPLASTY Right 2008   PACEMAKER GENERATOR CHANGE  03/01/2007   performed by Dr Marcine at [facility] ([employer])   PACEMAKER INSERTION  09/08/1994   performed in [place] for transient complete heart block   PPM GENERATOR CHANGEOUT N/A 06/04/2020   Procedure: PPM GENERATOR CHANGEOUT;  Surgeon: Kelsie Agent, MD;  Location: [facility];  Service: Cardiovascular;  Laterality: N/A;   THYROIDECTOMY, PARTIAL  1986   BENIGN NODULES   TOTAL HIP ARTHROPLASTY Left 02/13/2018   Procedure: TOTAL HIP ARTHROPLASTY ANTERIOR APPROACH;  Surgeon: Ernie Cough, MD;  Location: [facility];  Service: Orthopedics;  Laterality: Left;  70 mins   TOTAL HIP REVISION Right 02/10/2022   Procedure: TOTAL HIP REVISION;  Surgeon: Ernie Cough, MD;  Location: [facility];  Service: Orthopedics;  Laterality: Right;   TUBAL LIGATION  1987   Patient Active Problem List   Diagnosis Date Noted   Presence of heart assist device (HCC) 11/22/2023   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/10/2022   Fibroid 01/11/2022   Left atrial enlargement 11/16/2020   Atrial  flutter (HCC)    S/P left THA, AA 02/13/2018   OSA (obstructive sleep apnea) 10/20/2017    Class: Chronic   Osteopenia after menopause 06/14/2017   Osteoarthritis of left hip 05/11/2017   Diverticulosis of large intestine without hemorrhage 01/04/2016   Internal hemorrhoids without complication 01/04/2016   History of complete heart block 10/06/2015   Long term current use of anticoagulant 08/05/2015   Pacemaker 10/14/2014   Paroxysmal atrial fibrillation (HCC) 10/14/2014   AR (allergic rhinitis) 01/08/2013   GERD (gastroesophageal reflux disease) 01/08/2013   Hypothyroidism, postsurgical 01/08/2013   OA (osteoarthritis) 01/08/2013   History of peptic ulcer disease - nsaid  01/08/2013    PCP: Lavern Heck  REFERRING PROVIDER: Lavern Heck   REFERRING DIAG: Gait   THERAPY DIAG:  Other abnormalities of gait and mobility  Muscle weakness (generalized)  Rationale for Evaluation and Treatment: Rehabilitation  ONSET DATE:    SUBJECTIVE:   SUBJECTIVE STATEMENT:  Pt with no new complaints. Has been doing HEP.  Wearing heel lift with good comfort. She continues to be frustrated with hip weakness and walking pattern.   Eval: Pt states history of previous hip replacement R side, about 15 years ago. Was limping prior to surgery . Limp has not gone away .  L hip also done, and R re-done- due to faulty prostheis- recall. 02/10/2022.  Both knees are now hurting R>L.  Feels knees are weaker, difficulty going up stairs, R gives out- needs railing.  Did not have actual in person PT following last L and R hip replacements, did have some home health after the 1st.  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  She is concerned with the continued limping and would like to get stronger.    PERTINENT HISTORY: Pacemaker, A-fib , thyroid -ectomy   PAIN:  Are you having pain? Yes: NPRS scale: 3/10 Pain location: knees R >L  Pain description: sore, achey Aggravating factors: increased activity , stairs  Relieving factors: none stated    PRECAUTIONS: ICD/Pacemaker  WEIGHT BEARING RESTRICTIONS: No  FALLS:  Has patient fallen in last 6 months? Yes. Number of falls 1 going up front stairs, tripped going up stair.    PLOF: Independent  PATIENT GOALS:  improved strength in knees and hips, walking/limp, stairs.   NEXT MD VISIT:   OBJECTIVE:   updated 01/29/24  DIAGNOSTIC FINDINGS:   PATIENT SURVEYS:  Will do next visit   COGNITION: Overall cognitive status: Within functional limits for tasks assessed     SENSATION: WFL  EDEMA: N/A   POSTURE:  Supine: R LE longer than L;    Standing: R hip slightly higher  PALPATION: No pain to palpate  LOWER  EXTREMITY ROM: Hip ROM: WFL bil Lumbar: WFL    LOWER EXTREMITY MMT:  MMT Left eval Right  eval Left / Right 01/29/24  Hip flexion 4- 4/4+ 4 /  4+   Hip extension     Hip abduction 3+ 3+ 3+/  3  Hip adduction     Hip internal rotation     Hip external rotation     Knee flexion 4+ 4+   Knee extension 4 4   Ankle dorsiflexion     Ankle plantarflexion     Ankle inversion     Ankle eversion      (Blank rows = not tested)  LOWER EXTREMITY SPECIAL TESTS:    FUNCTIONAL TESTS:    GAIT: Distance walked: 100 ft Assistive device utilized: None Level of  assistance: Complete Independence Comments: trendelenburg   TODAY'S TREATMENT:                                                                                                                              DATE:   01/29/2024 Therapeutic Exercise: Aerobic: Bike L2 x 7  min  Supine:    bridging x 15; LE circles 2 x 5 ea bil;  S/L:   hip abd x10 on R,   Clams RTB x 15 bil;   s/l plank on knees trial x 3 bil;  Seated:   Standing:  Squats 2 positions x15 ea on TRX  Stretches: Neuromuscular Re-education Manual Therapy: Therapeutic Activity: Self Care:    Therapeutic Exercise: Aerobic: Bike L2 x 7  min  Supine: LTR x 10;    SLR x 10 bil;   small circles x 10 cw, ccw, bil;  S/L:   Hip abd 2  x 8 bil  Seated:   Standing: Hip abd 2 x 10 bil   ; Squats 3 x 5 (7lb )   Stretches: Neuromuscular Re-education: Weight shifts-staggered stance x 15 bil;  Step throughs 2 x 10 bil;  Slow walking in hallway- for control of hips and core x 8  Side stepping at counter x 6 Manual Therapy: Therapeutic Activity: Squats 3 x 5 (7lb )    Self Care:     Therapeutic Exercise: Aerobic: Bike L2 x 7  min  Supine: LTR x 10;    SLR x 10 bil;  small circles x 10 bil;  S/L:    Hip abd 2  x 6 on R ,  2 x 8 on L;  Seated:   Standing: Hip abd 2 x 10 bil    Stretches: Neuromuscular Re-education: Weight shifts-staggered stance x 15 bil;  Step  throughs 2 x 10 bil;  Slow walking in hallway- for control of hips and core x 8  Side stepping at counter x 6 Manual Therapy: Therapeutic Activity: Squats x 10;  Step ups 6 in x 10 bil; 1 Ue light support  Self Care:    Therapeutic Exercise: Aerobic: Supine: LTR x 10;   bridging 2 x 10 ;   SLR 2 x 10 on R,  2 x 10 on L (slight pain in L anterior hip on left by rep 8 )  S/L:   Hip abd  x 10 bil (able to do unassisted)  Seated:  Sit to stand x 10 (mat table)   Standing: Hip abd 2 x 10 bil - cueing for posture;   side stepping - cueing for mechanics and decreasing trunk sway x 10  Stretches: seated Hamstring stretch x 3 bil; piriformis x 3 bil;  Neuromuscular Re-education: Manual Therapy: Therapeutic Activity: Self Care:       PATIENT EDUCATION:  Education details: updated and reviewed HEP Person educated: Patient Education method: Explanation, Demonstration, Tactile cues, Verbal cues, and Handouts Education comprehension: verbalized understanding, returned  demonstration, verbal cues required, tactile cues required, and needs further education   HOME EXERCISE PROGRAM: Access Code: [email or web address]   ASSESSMENT:  CLINICAL IMPRESSION: Re-Cert: pt has been seen for 11 visits. She has no pain in hips, but slight soreness in knees. She will continue to wear heel lift for L shoe, due to leg length deficit. She has good comfort with wear, but gait mechanics not significantly improved thus far. She overall has improvements in L hip flexion, and mild improvements in R hip abd. Weakness for abduction continues to limit her gait pattern and stability. She has much improved postural awareness as well as understanding of mechanics for exercises for getting maximum benefit. Due to continued weakness, she will benefit from continued PT to improve hip strength, stability, and gait pattern. We discussed getting appt with sports medicine for further assessment of ongoing weakness, pt in agreement.    Eval: Patient presents with primary complaint of  weakness and gait deficits. Pt with poor gait mechanics, with trendelenburg present due to weakness in abductors. She has had long term limp with lack of effective HEP to improve. She has decreased functional strength of LEs, and will benefit from education on this.  Pt with decreased ability for full functional activities. Pt will  benefit from skilled PT to improve deficits and pain and to return to PLOF.   OBJECTIVE IMPAIRMENTS: Abnormal gait, decreased activity tolerance, difficulty walking, decreased strength, improper body mechanics, and pain.   ACTIVITY LIMITATIONS: standing, squatting, stairs, transfers, and locomotion level  PARTICIPATION LIMITATIONS: cleaning, shopping, community activity, and yard work  PERSONAL FACTORS: Past/current experiences and Time since onset of injury/illness/exacerbation are also affecting patient's functional outcome.   REHAB POTENTIAL: Good  CLINICAL DECISION MAKING: Evolving/moderate complexity  EVALUATION COMPLEXITY: Moderate   GOALS: Goals reviewed with patient? Yes  SHORT TERM GOALS: Target date: 12/25/2023   Pt to be independent with initial HEP  Goal status: MET  2.  Pt to demo ability to perform s/l hip abd against gravity x 5   Goal status: MET    LONG TERM GOALS: Target date: 03/25/24  Pt to be independent with final HEP  Goal status: In progress  2.  Pt to demo strength of bil hips to be at max ability, to improve gait mechanics.   Goal status: In progress   3.  Pt to demo gait mechanics to be at max ability, with decreased trendelenburg.   Goal status: In progress   4.  Outcome measure goal tbd   Goal status: not tested   PLAN:  PT FREQUENCY: 1-2x/week  PT DURATION: 8 weeks  PLANNED INTERVENTIONS: Therapeutic exercises, Therapeutic activity, Neuromuscular re-education, Patient/Family education, Self Care, Joint mobilization, Joint manipulation, Stair training,  Orthotic/Fit training, DME instructions, Aquatic Therapy, Dry Needling, Electrical stimulation, Cryotherapy, Moist heat, Taping, Ultrasound, Ionotophoresis 4mg /ml Dexamethasone , Manual therapy,  Vasopneumatic device, Traction, Spinal manipulation, Spinal mobilization,Balance training, Gait training,   PLAN FOR NEXT SESSION:   gait , try pilates leg work in straps.    Tinnie Don, PT, DPT 3:34 PM  01/29/2024   "

## 2024-02-05 ENCOUNTER — Ambulatory Visit: Admitting: Physical Therapy

## 2024-02-05 ENCOUNTER — Encounter: Payer: Self-pay | Admitting: Physical Therapy

## 2024-02-05 DIAGNOSIS — R2689 Other abnormalities of gait and mobility: Secondary | ICD-10-CM | POA: Diagnosis not present

## 2024-02-05 DIAGNOSIS — M6281 Muscle weakness (generalized): Secondary | ICD-10-CM

## 2024-02-05 NOTE — Therapy (Signed)
 " OUTPATIENT PHYSICAL THERAPY LOWER EXTREMITY TREATMENT   Patient Name: Kristin [name] MRN: [id number] DOB:07-07-1950, 74 y.o., female Today's Date: 02/05/2024     END OF SESSION:  PT End of Session - 02/05/24 1217     Visit Number 12    Number of Visits 20    Date for Recertification  03/25/24    Authorization Type [employer], Re-cert done at visit 11.    PT Start Time 1105    PT Stop Time 1147    PT Time Calculation (min) 42 min    Activity Tolerance Patient tolerated treatment well    Behavior During Therapy WFL for tasks assessed/performed            Past Medical History:  Diagnosis Date   Anemia    AR (allergic rhinitis)    Atrial fibrillation (HCC) 2018   Atrial flutter (HCC)    Clotting disorder    pt denies   Diverticulosis    Esophageal ulcer    due to Motrin   GERD (gastroesophageal reflux disease)    Hiatal hernia    HLD (hyperlipidemia)    Hypothyroidism    IBS (irritable bowel syndrome)    Migraines    Mild sleep apnea    Osteoarthritis    Osteopenia after menopause 06/14/2017   DEXA 08/2016 [facility]: T = -1.10 radius, -1.0 femur; lumbar spine elevated due to DJD; recheck 2-3 years.   Pacemaker    CHB   Paroxysmal A-fib (HCC)    PUD (peptic ulcer disease)    Sleep apnea    Thyroid  nodule    Transient complete heart block 1996   Past Surgical History:  Procedure Laterality Date   ATRIAL FIBRILLATION ABLATION N/A 01/08/2019   Procedure: ATRIAL FIBRILLATION ABLATION;  Surgeon: Kelsie Agent, MD;  Location: [facility];  Service: Cardiovascular;  Laterality: N/A;   ATRIAL FIBRILLATION ABLATION N/A 04/02/2019   Procedure: ATRIAL FIBRILLATION ABLATION;  Surgeon: Kelsie Agent, MD;  Location: [facility];  Service: Cardiovascular;  Laterality: N/A;   CARDIOVERSION N/A 01/28/2019   Procedure: CARDIOVERSION;  Surgeon: Kate Lonni CROME, MD;  Location: [facility] ENDOSCOPY;  Service: Endoscopy;  Laterality: N/A;   CARDIOVERSION N/A 02/27/2019    Procedure: CARDIOVERSION;  Surgeon: Kate Lonni CROME, MD;  Location: [facility] ENDOSCOPY;  Service: Endoscopy;  Laterality: N/A;   COLONOSCOPY  2017   [place]   HIP ARTHROPLASTY Right 2008   PACEMAKER GENERATOR CHANGE  03/01/2007   performed by Dr Marcine at [facility] ([employer])   PACEMAKER INSERTION  09/08/1994   performed in [facility] for transient complete heart block   PPM GENERATOR CHANGEOUT N/A 06/04/2020   Procedure: PPM GENERATOR CHANGEOUT;  Surgeon: Kelsie Agent, MD;  Location: [facility];  Service: Cardiovascular;  Laterality: N/A;   THYROIDECTOMY, PARTIAL  1986   BENIGN NODULES   TOTAL HIP ARTHROPLASTY Left 02/13/2018   Procedure: TOTAL HIP ARTHROPLASTY ANTERIOR APPROACH;  Surgeon: Ernie Cough, MD;  Location: [facility];  Service: Orthopedics;  Laterality: Left;  70 mins   TOTAL HIP REVISION Right 02/10/2022   Procedure: TOTAL HIP REVISION;  Surgeon: Ernie Cough, MD;  Location: [facility];  Service: Orthopedics;  Laterality: Right;   TUBAL LIGATION  1987   Patient Active Problem List   Diagnosis Date Noted   Presence of heart assist device (HCC) 11/22/2023   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/10/2022   Fibroid 01/11/2022   Left atrial enlargement 11/16/2020   Atrial flutter (HCC)  S/P left THA, AA 02/13/2018   OSA (obstructive sleep apnea) 10/20/2017    Class: Chronic   Osteopenia after menopause 06/14/2017   Osteoarthritis of left hip 05/11/2017   Diverticulosis of large intestine without hemorrhage 01/04/2016   Internal hemorrhoids without complication 01/04/2016   History of complete heart block 10/06/2015   Long term current use of anticoagulant 08/05/2015   Pacemaker 10/14/2014   Paroxysmal atrial fibrillation (HCC) 10/14/2014   AR (allergic rhinitis) 01/08/2013   GERD (gastroesophageal reflux disease) 01/08/2013   Hypothyroidism, postsurgical 01/08/2013   OA (osteoarthritis) 01/08/2013   History of peptic ulcer disease - nsaid 01/08/2013     PCP: Lavern Heck  REFERRING PROVIDER: Lavern Heck   REFERRING DIAG: Gait   THERAPY DIAG:  Other abnormalities of gait and mobility  Muscle weakness (generalized)  Rationale for Evaluation and Treatment: Rehabilitation  ONSET DATE:    SUBJECTIVE:   SUBJECTIVE STATEMENT:  Pt with no new complaints. Has MD appt tomorrow for 1st visit with sports med.   Eval: Pt states history of previous hip replacement R side, about 15 years ago. Was limping prior to surgery . Limp has not gone away .  L hip also done, and R re-done- due to faulty prostheis- recall. 02/10/2022.  Both knees are now hurting R>L.  Feels knees are weaker, difficulty going up stairs, R gives out- needs railing.  Did not have actual in person PT following last L and R hip replacements, did have some home health after the 1st.  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  She is concerned with the continued limping and would like to get stronger.    PERTINENT HISTORY: Pacemaker, A-fib , thyroid -ectomy   PAIN:  Are you having pain? Yes: NPRS scale: 3/10 Pain location: knees R >L  Pain description: sore, achey Aggravating factors: increased activity , stairs  Relieving factors: none stated    PRECAUTIONS: ICD/Pacemaker  WEIGHT BEARING RESTRICTIONS: No  FALLS:  Has patient fallen in last 6 months? Yes. Number of falls 1 going up front stairs, tripped going up stair.    PLOF: Independent  PATIENT GOALS:  improved strength in knees and hips, walking/limp, stairs.   NEXT MD VISIT:   OBJECTIVE:   updated 01/29/24  DIAGNOSTIC FINDINGS:   PATIENT SURVEYS:  Will do next visit   COGNITION: Overall cognitive status: Within functional limits for tasks assessed     SENSATION: WFL  EDEMA: N/A   POSTURE:  Supine: R LE longer than L;    Standing: R hip slightly higher  PALPATION: No pain to palpate  LOWER EXTREMITY ROM: Hip ROM: WFL bil Lumbar: WFL    LOWER EXTREMITY MMT:  MMT Left eval  Right  eval Left / Right 01/29/24  Hip flexion 4- 4/4+ 4 /  4+   Hip extension     Hip abduction 3+ 3+ 3+/  3  Hip adduction     Hip internal rotation     Hip external rotation     Knee flexion 4+ 4+   Knee extension 4 4   Ankle dorsiflexion     Ankle plantarflexion     Ankle inversion     Ankle eversion      (Blank rows = not tested)  LOWER EXTREMITY SPECIAL TESTS:    FUNCTIONAL TESTS:    GAIT: Distance walked: 100 ft Assistive device utilized: None Level of assistance: Complete Independence Comments: trendelenburg   TODAY'S TREATMENT:  DATE:   02/05/2024 Therapeutic Exercise: Aerobic: Bike L2 x 7  min  Supine:    bridging x 15;  S/L:    s/l plank on knees  3 bil;  Seated:   Standing:  Squats 2 positions x15 ea on TRX  Stretches: Neuromuscular Re-education Manual Therapy: Therapeutic Activity: Pilates Reformer: leg work: double leg press on bar (with band on thighs) x 15, Single leg press on bar x 15 ea Circles small, larger - feet in straps x 10 ea bil; double leg press- feet in straps x 10;  S/L leg press x 15 bil;  Self Care:    Therapeutic Exercise: Aerobic: Bike L2 x 7  min  Supine: LTR x 10;    SLR x 10 bil;   small circles x 10 cw, ccw, bil;  S/L:   Hip abd 2  x 8 bil  Seated:   Standing: Hip abd 2 x 10 bil   ; Squats 3 x 5 (7lb )   Stretches: Neuromuscular Re-education: Weight shifts-staggered stance x 15 bil;  Step throughs 2 x 10 bil;  Slow walking in hallway- for control of hips and core x 8  Side stepping at counter x 6 Manual Therapy: Therapeutic Activity: Squats 3 x 5 (7lb )   Self Care:    Therapeutic Exercise: Aerobic: Bike L2 x 7  min  Supine: LTR x 10;    SLR x 10 bil;  small circles x 10 bil;  S/L:    Hip abd 2  x 6 on R ,  2 x 8 on L;  Seated:   Standing: Hip abd 2 x 10 bil    Stretches: Neuromuscular  Re-education: Weight shifts-staggered stance x 15 bil;  Step throughs 2 x 10 bil;  Slow walking in hallway- for control of hips and core x 8  Side stepping at counter x 6 Manual Therapy: Therapeutic Activity: Squats x 10;  Step ups 6 in x 10 bil; 1 Ue light support  Self Care:    Therapeutic Exercise: Aerobic: Supine: LTR x 10;   bridging 2 x 10 ;   SLR 2 x 10 on R,  2 x 10 on L (slight pain in L anterior hip on left by rep 8 )  S/L:   Hip abd  x 10 bil (able to do unassisted)  Seated:  Sit to stand x 10 (mat table)   Standing: Hip abd 2 x 10 bil - cueing for posture;   side stepping - cueing for mechanics and decreasing trunk sway x 10  Stretches: seated Hamstring stretch x 3 bil; piriformis x 3 bil;  Neuromuscular Re-education: Manual Therapy: Therapeutic Activity: Self Care:       PATIENT EDUCATION:  Education details: updated and reviewed HEP Person educated: Patient Education method: Explanation, Demonstration, Tactile cues, Verbal cues, and Handouts Education comprehension: verbalized understanding, returned demonstration, verbal cues required, tactile cues required, and needs further education   HOME EXERCISE PROGRAM: Access Code: [email or web address]   ASSESSMENT:  CLINICAL IMPRESSION: Continued work on strength, with work on land today for improving hip muscle strength. Pt with good tolerance, will benefit from continued care for max strength outcome.    Re-Cert: pt has been seen for 11 visits. She has no pain in hips, but slight soreness in knees. She will continue to wear heel lift for L shoe, due to leg length deficit. She has good comfort with wear, but gait mechanics not significantly improved thus far. She overall has  improvements in L hip flexion, and mild improvements in R hip abd. Weakness for abduction continues to limit her gait pattern and stability. She has much improved postural awareness as well as understanding of mechanics for exercises for  getting maximum benefit. Due to continued weakness, she will benefit from continued PT to improve hip strength, stability, and gait pattern. We discussed getting appt with sports medicine for further assessment of ongoing weakness, pt in agreement.   Eval: Patient presents with primary complaint of  weakness and gait deficits. Pt with poor gait mechanics, with trendelenburg present due to weakness in abductors. She has had long term limp with lack of effective HEP to improve. She has decreased functional strength of LEs, and will benefit from education on this.  Pt with decreased ability for full functional activities. Pt will  benefit from skilled PT to improve deficits and pain and to return to PLOF.   OBJECTIVE IMPAIRMENTS: Abnormal gait, decreased activity tolerance, difficulty walking, decreased strength, improper body mechanics, and pain.   ACTIVITY LIMITATIONS: standing, squatting, stairs, transfers, and locomotion level  PARTICIPATION LIMITATIONS: cleaning, shopping, community activity, and yard work  PERSONAL FACTORS: Past/current experiences and Time since onset of injury/illness/exacerbation are also affecting patient's functional outcome.   REHAB POTENTIAL: Good  CLINICAL DECISION MAKING: Evolving/moderate complexity  EVALUATION COMPLEXITY: Moderate   GOALS: Goals reviewed with patient? Yes  SHORT TERM GOALS: Target date: 12/25/2023   Pt to be independent with initial HEP  Goal status: MET  2.  Pt to demo ability to perform s/l hip abd against gravity x 5   Goal status: MET    LONG TERM GOALS: Target date: 03/25/24  Pt to be independent with final HEP  Goal status: In progress  2.  Pt to demo strength of bil hips to be at max ability, to improve gait mechanics.   Goal status: In progress   3.  Pt to demo gait mechanics to be at max ability, with decreased trendelenburg.   Goal status: In progress   4.  Outcome measure goal tbd   Goal status: not  tested   PLAN:  PT FREQUENCY: 1-2x/week  PT DURATION: 8 weeks  PLANNED INTERVENTIONS: Therapeutic exercises, Therapeutic activity, Neuromuscular re-education, Patient/Family education, Self Care, Joint mobilization, Joint manipulation, Stair training, Orthotic/Fit training, DME instructions, Aquatic Therapy, Dry Needling, Electrical stimulation, Cryotherapy, Moist heat, Taping, Ultrasound, Ionotophoresis 4mg /ml Dexamethasone , Manual therapy,  Vasopneumatic device, Traction, Spinal manipulation, Spinal mobilization,Balance training, Gait training,   PLAN FOR NEXT SESSION:   gait , try pilates leg work in straps.    Tinnie Don, PT, DPT 12:17 PM  02/05/2024   "

## 2024-02-06 ENCOUNTER — Encounter: Payer: Self-pay | Admitting: Family Medicine

## 2024-02-06 ENCOUNTER — Other Ambulatory Visit: Payer: Self-pay

## 2024-02-06 ENCOUNTER — Ambulatory Visit: Admitting: Family Medicine

## 2024-02-06 VITALS — BP 118/80 | HR 68 | Ht 67.0 in | Wt 203.0 lb

## 2024-02-06 DIAGNOSIS — M25551 Pain in right hip: Secondary | ICD-10-CM

## 2024-02-06 DIAGNOSIS — M25552 Pain in left hip: Secondary | ICD-10-CM | POA: Diagnosis not present

## 2024-02-06 DIAGNOSIS — R29898 Other symptoms and signs involving the musculoskeletal system: Secondary | ICD-10-CM | POA: Diagnosis not present

## 2024-02-06 NOTE — Progress Notes (Signed)
"       ° °  I, Leotis Batter, CMA acting as a scribe for Artist Lloyd, MD.  Kristin [name] is a 74 y.o. female who presents to [facility] today for bilat hip pain and weakness x several years, hx of multiple hip replacements. No pain, just weakness. Sx have improved slightly with PT. Denies back pain. Denies leg n/t/w. Occasional knee pain.   Radiates: hip weakness Aggravates: Wb, ambulation Treatments tried: PT x 12 visits,   Dx testing: 04/07/23 Ab/pelvis CT  02/10/22 Pelvis XR  Pertinent review of systems: No fevers or chills  Relevant historical information: Bilateral hip replacement.  Osteopenia.  A-fib.  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   Exam:  BP 118/80   Pulse 68   Ht 5' 7 (1.702 m)   Wt 203 lb (92.1 kg)   SpO2 90%   BMI 31.79 kg/m  General: Well Developed, well nourished, and in no acute distress.   MSK: Hips bilaterally normal appearing normal motion. Strength reduced hip adduction bilaterally 3+/5.  External rotation strength is reduced bilaterally 4/5.  Hip flexion is reduced left 4/5 and normal right 5/5.  Lower extremity strength is otherwise intact.  Gait: Trendelenburg.  Leg length discrepancy left leg approximately 1 cm shorter than right.   Lab and Radiology Results  CT scan lumbar spine and bilateral hips obtained on CT scan abdomen and pelvis in March 2025 personally independently interpreted today. 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 Bilateral hip total hip arthroplasty well-appearing hardware without evidence of loosening.     Assessment and Plan: 74 y.o. female with bilateral hip weakness.  Patient has bilateral hip weakness impacting her gait.  She has a Trendelenburg gait.  She has had a great trial of physical therapy recently.  PT started on November 10 and ended yesterday.  Unfortunately she has not improved her strength significantly.  Plan for advanced imaging lumbar spine.  She has a MRI  noncompatible pacemaker.  Plan for CT myelogram lumbar spine.  Anticipate either neurosurgery consultation or epidural steroid injection following CT scan.   PDMP not reviewed this encounter. Orders Placed This Encounter  Procedures   CT LUMBAR SPINE W CONTRAST    Standing Status:   Future    Expiration Date:   02/05/2025    If indicated for the ordered procedure, I authorize the administration of contrast media per Radiology protocol:   Yes    Does the patient have a contrast media/X-ray dye allergy?:   No    Preferred imaging location?:   [facility]   DG MYELOGRAPHY LUMBAR INJ LUMBOSACRAL    Standing Status:   Future    Expiration Date:   02/05/2025    Reason for Exam (SYMPTOM  OR DIAGNOSIS REQUIRED):   B LE weakness    Preferred Imaging Location?:   [facility]   No orders of the defined types were placed in this encounter.    Discussed warning signs or symptoms. Please see discharge instructions. Patient expresses understanding.   The above documentation has been reviewed and is accurate and complete Artist Lloyd, M.D.   "

## 2024-02-06 NOTE — Patient Instructions (Addendum)
 Thank you for coming in today.   Order placed for CT Myelogram of the Lumbar Spine (low back). Someone from the imaging facility, [facility], will reach out to assist with scheduling after obtaining authorization for the procedure from your insurance company.   See you back to review results.

## 2024-02-12 ENCOUNTER — Encounter: Admitting: Physical Therapy

## 2024-02-13 ENCOUNTER — Telehealth: Payer: Self-pay

## 2024-02-13 NOTE — Telephone Encounter (Signed)
 Spoke with patient regarding CT Myelogram. Recommended patient not stop her Eliquis  for this procedure. Will send to Dr Almetta for confirmation. Pt verbalizes understanding.

## 2024-02-13 NOTE — Telephone Encounter (Signed)
 Spoke with the patient and advised on recommendations per Dr. Almetta. Patient verbalized understanding.

## 2024-02-13 NOTE — Telephone Encounter (Signed)
 Pt called in stating that she is getting a CT Myelogram and imaging told her she doesn't have to stop her eliquis , pt wants to make sure this is fine

## 2024-02-20 ENCOUNTER — Other Ambulatory Visit: Payer: Self-pay | Admitting: Internal Medicine

## 2024-02-20 DIAGNOSIS — I480 Paroxysmal atrial fibrillation: Secondary | ICD-10-CM

## 2024-02-20 NOTE — Discharge Instructions (Signed)

## 2024-02-21 ENCOUNTER — Inpatient Hospital Stay
Admission: RE | Admit: 2024-02-21 | Discharge: 2024-02-21 | Disposition: A | Source: Ambulatory Visit | Attending: Family Medicine | Admitting: Family Medicine

## 2024-02-21 ENCOUNTER — Ambulatory Visit
Admission: RE | Admit: 2024-02-21 | Discharge: 2024-02-21 | Disposition: A | Source: Ambulatory Visit | Attending: Family Medicine | Admitting: Family Medicine

## 2024-02-21 DIAGNOSIS — R29898 Other symptoms and signs involving the musculoskeletal system: Secondary | ICD-10-CM

## 2024-02-21 MED ORDER — ONDANSETRON HCL 4 MG/2ML IJ SOLN: 4.0000 mg | Freq: Once | INTRAMUSCULAR | Status: DC | PRN

## 2024-02-21 MED ORDER — MEPERIDINE HCL 50 MG/ML IJ SOLN: 50.0000 mg | Freq: Once | INTRAMUSCULAR | Status: DC | PRN

## 2024-02-21 MED ORDER — IOPAMIDOL (ISOVUE-M 200) INJECTION 41%
20.0000 mL | Freq: Once | INTRAMUSCULAR | Status: AC
  Administered 2024-02-21: 20 mL via INTRATHECAL

## 2024-02-21 MED ORDER — DIAZEPAM 5 MG PO TABS
5.0000 mg | ORAL_TABLET | Freq: Once | ORAL | Status: AC
  Administered 2024-02-21: 5 mg via ORAL

## 2024-02-21 NOTE — Procedures (Signed)
 Interventional Radiology Procedure Note  Procedure: Lumbar myelogram  Complications: None  Estimated Blood Loss: < 10 mL  Findings: L2-3 lumbar puncture for contrast injection/myelogram  Cordella DELENA Banner, MD

## 2024-02-22 ENCOUNTER — Encounter: Admitting: Physical Therapy

## 2024-02-26 ENCOUNTER — Encounter: Admitting: Physical Therapy

## 2024-02-27 ENCOUNTER — Ambulatory Visit: Payer: Self-pay | Admitting: Family Medicine

## 2024-02-27 DIAGNOSIS — R29898 Other symptoms and signs involving the musculoskeletal system: Secondary | ICD-10-CM

## 2024-02-27 NOTE — Progress Notes (Signed)
 CT myelogram of the lumbar spine shows widespread arthritis with areas of significantly pinched nerves.  I do think it is reasonable to try an injection in your back.  I will order an epidural steroid injection.  Please let me know how it goes.

## 2024-02-28 ENCOUNTER — Telehealth (HOSPITAL_BASED_OUTPATIENT_CLINIC_OR_DEPARTMENT_OTHER): Payer: Self-pay | Admitting: *Deleted

## 2024-02-28 NOTE — Telephone Encounter (Signed)
"  ° °  Pre-operative Risk Assessment    Patient Name: Kristin [name]  DOB: 1950/02/25 MRN: [id number]   Date of last office visit: 11/09/23 DR. TAYLOR Date of next office visit: NONE   Request for Surgical Clearance    Procedure:  LUMBAR EPIDURAL  Date of Surgery:  Clearance TBD                                Surgeon:  NOT LISTED Surgeon's Group or Practice Name:  [facility]  Phone number:  [phone] Fax number:  [phone] attn: WHITLEY   Type of Clearance Requested:   - Medical  - Pharmacy:  Hold Apixaban  (Eliquis ) x 4 DOSES    Type of Anesthesia:  Not Indicated   Additional requests/questions:    Bonney Niels Jest   02/28/2024, 11:59 AM   "

## 2024-03-01 ENCOUNTER — Telehealth (HOSPITAL_BASED_OUTPATIENT_CLINIC_OR_DEPARTMENT_OTHER): Payer: Self-pay | Admitting: *Deleted

## 2024-03-01 NOTE — Telephone Encounter (Signed)
 Patient with diagnosis of atrial fibrillation on Eliquis  for anticoagulation.    Procedure:  LUMBAR EPIDURAL   Date of Surgery:  Clearance TBD     CHA2DS2-VASc Score = 2   This indicates a 2.2% annual risk of stroke. The patient's score is based upon: CHF History: 0 HTN History: 0 Diabetes History: 0 Stroke History: 0 Vascular Disease History: 0 Age Score: 1 Gender Score: 1       CrCl 95  Platelet count 245  Patient has not had an Afib/aflutter ablation in the last 3 months, DCCV within the last 4 weeks or a watchman implanted in the last 45 days    Per office protocol, patient can hold Eliquis  for 3 days prior to procedure.   Patient will not need bridging with Lovenox (enoxaparin) around procedure.  Note:  our protocol requires a 3 day hold for all spinal procedures  **This guidance is not considered finalized until pre-operative APP has relayed final recommendations.**

## 2024-03-01 NOTE — Telephone Encounter (Signed)
" ° °  Name: Kristin [name]  DOB: [birth year]  MRN: [id number]  Primary Cardiologist: None   Preoperative team, please contact this patient and set up a phone call appointment for further preoperative risk assessment. Please obtain consent and complete medication review. Thank you for your help.  I confirm that guidance regarding antiplatelet and oral anticoagulation therapy has been completed and, if necessary, noted below.  Per office protocol, patient can hold Eliquis  for 3 days prior to procedure.   Patient will not need bridging with Lovenox (enoxaparin) around procedure.  I also confirmed the patient resides in the state of [place] . As per [facility] Medical Board telemedicine laws, the patient must reside in the state in which the provider is licensed.   Lamarr Satterfield, NP 03/01/2024, 4:35 PM [facility]    "

## 2024-03-01 NOTE — Telephone Encounter (Signed)
 Pt has been scheduled tele preop appt 03/06/24. Procedure will not be scheduled until she has been cleared per the pt. Med rec and consent are done.

## 2024-03-01 NOTE — Telephone Encounter (Signed)
 Pt has been scheduled tele preop appt 03/06/24. Procedure will not be scheduled until she has been cleared per the pt. Med rec and consent are done.      Patient Consent for Virtual Visit        Kristin [name] has provided verbal consent on 03/01/2024 for a virtual visit (video or telephone).   CONSENT FOR VIRTUAL VISIT FOR:  Barnie FORBES [name]  By participating in this virtual visit I agree to the following:  I hereby voluntarily request, consent and authorize [facility] and its employed or contracted physicians, physician assistants, nurse practitioners or other licensed health care professionals (the Practitioner), to provide me with telemedicine health care services (the Services) as deemed necessary by the treating Practitioner. I acknowledge and consent to receive the Services by the Practitioner via telemedicine. I understand that the telemedicine visit will involve communicating with the Practitioner through live audiovisual communication technology and the disclosure of certain medical information by electronic transmission. I acknowledge that I have been given the opportunity to request an in-person assessment or other available alternative prior to the telemedicine visit and am voluntarily participating in the telemedicine visit.  I understand that I have the right to withhold or withdraw my consent to the use of telemedicine in the course of my care at any time, without affecting my right to future care or treatment, and that the Practitioner or I may terminate the telemedicine visit at any time. I understand that I have the right to inspect all information obtained and/or recorded in the course of the telemedicine visit and may receive copies of available information for a reasonable fee.  I understand that some of the potential risks of receiving the Services via telemedicine include:  Delay or interruption in medical evaluation due to technological equipment failure or  disruption; Information transmitted may not be sufficient (e.g. poor resolution of images) to allow for appropriate medical decision making by the Practitioner; and/or  In rare instances, security protocols could fail, causing a breach of personal health information.  Furthermore, I acknowledge that it is my responsibility to provide information about my medical history, conditions and care that is complete and accurate to the best of my ability. I acknowledge that Practitioner's advice, recommendations, and/or decision may be based on factors not within their control, such as incomplete or inaccurate data provided by me or distortions of diagnostic images or specimens that may result from electronic transmissions. I understand that the practice of medicine is not an exact science and that Practitioner makes no warranties or guarantees regarding treatment outcomes. I acknowledge that a copy of this consent can be made available to me via my patient portal [facility] MyChart), or I can request a printed copy by calling the office of [facility].    I understand that my insurance will be billed for this visit.   I have read or had this consent read to me. I understand the contents of this consent, which adequately explains the benefits and risks of the Services being provided via telemedicine.  I have been provided ample opportunity to ask questions regarding this consent and the Services and have had my questions answered to my satisfaction. I give my informed consent for the services to be provided through the use of telemedicine in my medical care

## 2024-03-06 ENCOUNTER — Ambulatory Visit

## 2024-03-08 ENCOUNTER — Ambulatory Visit: Admitting: Family Medicine

## 2024-03-08 ENCOUNTER — Ambulatory Visit

## 2024-06-07 ENCOUNTER — Ambulatory Visit

## 2024-09-06 ENCOUNTER — Ambulatory Visit

## 2024-10-15 ENCOUNTER — Ambulatory Visit

## 2024-11-26 ENCOUNTER — Encounter: Admitting: Family Medicine

## 2024-12-06 ENCOUNTER — Ambulatory Visit

## 2025-03-07 ENCOUNTER — Ambulatory Visit
